# Patient Record
Sex: Female | Born: 1980 | Race: Black or African American | Hispanic: No | Marital: Single | Smoking: Never smoker
Health system: Southern US, Community
[De-identification: ages and names within clinical notes are randomized; demographics above are authoritative.]

## PROBLEM LIST (undated history)

## (undated) DIAGNOSIS — A6000 Herpesviral infection of urogenital system, unspecified: Secondary | ICD-10-CM

## (undated) DIAGNOSIS — E559 Vitamin D deficiency, unspecified: Secondary | ICD-10-CM

## (undated) DIAGNOSIS — E049 Nontoxic goiter, unspecified: Secondary | ICD-10-CM

## (undated) DIAGNOSIS — E282 Polycystic ovarian syndrome: Secondary | ICD-10-CM

## (undated) DIAGNOSIS — R87629 Unspecified abnormal cytological findings in specimens from vagina: Secondary | ICD-10-CM

## (undated) DIAGNOSIS — R310 Gross hematuria: Secondary | ICD-10-CM

## (undated) DIAGNOSIS — D649 Anemia, unspecified: Secondary | ICD-10-CM

## (undated) DIAGNOSIS — D219 Benign neoplasm of connective and other soft tissue, unspecified: Secondary | ICD-10-CM

## (undated) DIAGNOSIS — B353 Tinea pedis: Secondary | ICD-10-CM

## (undated) DIAGNOSIS — I10 Essential (primary) hypertension: Secondary | ICD-10-CM

## (undated) DIAGNOSIS — O09521 Supervision of elderly multigravida, first trimester: Secondary | ICD-10-CM

## (undated) DIAGNOSIS — IMO0002 Reserved for concepts with insufficient information to code with codable children: Secondary | ICD-10-CM

## (undated) DIAGNOSIS — Z6841 Body Mass Index (BMI) 40.0 and over, adult: Secondary | ICD-10-CM

## (undated) HISTORY — DX: Herpesviral infection of urogenital system, unspecified: A60.00

## (undated) HISTORY — DX: Vitamin D deficiency, unspecified: E55.9

## (undated) HISTORY — DX: Essential (primary) hypertension: I10

## (undated) HISTORY — DX: Nontoxic goiter, unspecified: E04.9

## (undated) HISTORY — DX: Tinea pedis: B35.3

## (undated) HISTORY — DX: Polycystic ovarian syndrome: E28.2

## (undated) HISTORY — DX: Reserved for concepts with insufficient information to code with codable children: IMO0002

## (undated) HISTORY — DX: Body Mass Index (BMI) 40.0 and over, adult: Z684

## (undated) HISTORY — DX: Gross hematuria: R31.0

## (undated) HISTORY — DX: Morbid (severe) obesity due to excess calories: E66.01

## (undated) HISTORY — DX: Supervision of elderly multigravida, first trimester: O09.521

---
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
## 2006-06-02 ENCOUNTER — Encounter: Payer: Self-pay | Admitting: Obstetrics & Gynecology

## 2006-06-02 ENCOUNTER — Ambulatory Visit: Payer: Self-pay | Admitting: Obstetrics & Gynecology

## 2006-12-08 ENCOUNTER — Ambulatory Visit: Payer: Self-pay | Admitting: Obstetrics & Gynecology

## 2007-08-23 ENCOUNTER — Ambulatory Visit: Payer: Self-pay | Admitting: Obstetrics and Gynecology

## 2007-08-25 ENCOUNTER — Encounter: Admission: RE | Admit: 2007-08-25 | Discharge: 2007-08-25 | Payer: Self-pay | Admitting: Obstetrics and Gynecology

## 2007-10-05 ENCOUNTER — Ambulatory Visit (HOSPITAL_COMMUNITY): Admission: RE | Admit: 2007-10-05 | Discharge: 2007-10-05 | Payer: Self-pay | Admitting: Gynecology

## 2007-10-19 ENCOUNTER — Ambulatory Visit: Payer: Self-pay | Admitting: Obstetrics and Gynecology

## 2007-12-01 ENCOUNTER — Emergency Department: Payer: Self-pay | Admitting: Emergency Medicine

## 2010-06-03 NOTE — Assessment & Plan Note (Signed)
NAMESHERON, TALLMAN                ACCOUNT NO.:  [id number]   MEDICAL RECORD NO.:  [id number]          PATIENT TYPE:  POB   LOCATION:  [facility] at [facility]         FACILITY:  [facility]   PHYSICIAN:  Johnella Moloney, MD        DATE OF BIRTH:  November 28, 1980   DATE OF SERVICE:  12/08/2006                                  CLINIC NOTE   CHIEF COMPLAINT:  1. Breast tenderness.  2. Vaginal discharge with odor.  3. Needs followup from previous visit.   HISTORY OF PRESENT ILLNESS:  Patient is a 30 year old gravida 0, who was  last seen on Jun 02, 2006, for her annual exam.  Patient, at that visit,  was evaluated regarding her obesity and hirsutism and was prescribed  Yasmin and Vaniqa.  Patient also had a thyroid stimulating hormone  check, which was normal and a calculated blood glucose that was normal.  At this visit, patient does report that she did not start using the  Yasmin or Vaniqa and is interested in obtaining another set of  prescriptions for both medications for treatment of her hirsutism.  Patient also reports having breast tenderness a few days.  She does not  feel any lumps or any other lesions or abnormal drainage.  She just  wanted to make sure everything was okay.  She does have a history of a  paternal grandmother, who developed early breast cancer in her eighties.  Patient also reports having vaginal discharge, which started three  months ago.  She describes the discharge as copious, white, with a fishy  odor that is associated with pruritus.  She was evaluated at another  medical facility and treated with Flagyl.  The discharge did go away,  but came back two weeks ago.  She denies any intermenstrual bleeding or  any other symptoms.   PHYSICAL EXAMINATION:  VITAL SIGNS:  Pulse 78, weight 281, blood  pressure 141/84.  IN GENERAL:  No apparent distress.  BREAST EXAM:  Bilateral symmetric breasts, no abnormal skin changes.  No  abnormal lumps palpated.  Patient reported some pain  on 10 o'clock  position of her left breast.  No abnormal changes or lesions palpated  there.  Overall normal breast exam.  ABDOMINAL EXAM:  Obese, nontender.  PELVIC:  Normal external female genitalia, no lesions.  On speculum  examination, small amount of thick, white discharge on the vaginal  vault.  Sample taken for wet prep.  No lesions observed.  Cervix normal.  Bimanual exam normal.   ASSESSMENT AND PLAN:  Patient is a 30 year old gravida 0, who comes in  for multiple issues:   1. Breast tenderness:  Likely cyclical breast tenderness.  Patient was      reassured that no lesions were palpated and there was no reason for      concern at this point.  She was told to continue to monitor her      breast tenderness and to return to clinic for any additional      symptoms.  She was also told that, once she starts the Yasmin, this      will be a common side effects from hormonal  treatment.  2. Abnormal vaginal discharge:  Small amount of discharge noted on      examination.  Wet prep obtained.  We will follow up results and      treat accordingly.  3. Hirsutism:  Patient will start her Yasmin and Vaniqa with her next      menstrual period and will follow up response of patient.   Patient was told to return to clinic for any further concerns or to  return next year for her annual exam.           ______________________________  Johnella Moloney, MD     UD/MEDQ  D:  12/08/2006  T:  12/08/2006  Job:  [id number]

## 2010-06-03 NOTE — Assessment & Plan Note (Signed)
[name], Ware                ACCOUNT NO.:  [id number]   MEDICAL RECORD NO.:  [id number]          PATIENT TYPE:  POB   LOCATION:  [facility]         FACILITY:  [facility]   PHYSICIAN:  Allie Bossier, MD        DATE OF BIRTH:  October 20, 1980   DATE OF SERVICE:                                  CLINIC NOTE   Michelle Ware is a 30 year old single black gravida 0 who comes in today for  her annual exam.  She has no particular complaints, although her  hirsutism has been unchanged since her laser treatments last year.  She  reports that with regards to her hirsutism she has had an endocrinology  exam in 2004 where blood work was done and she was told this was  normal.  She has also had a 30-pound weight gain in the year 2007.  Her  thyroid has not been evaluated any time recently.   PAST MEDICAL HISTORY:  Morbid obesity and hirsutism.   PAST SURGICAL HISTORY:  Oral surgery.  At that time she had no problems  with the anesthetic.  She has also had laser treatment for facial hair.   FAMILY HISTORY:  Positive for breast cancer in paternal grandmother, but  no GYN or colon malignancies.  Diabetes is also present in her family.   SOCIAL HISTORY:  She denies alcohol, tobacco or drug use.   ALLERGIES:  No allergies to medicines or Latex.   REVIEW OF SYSTEMS:  She has been abstinent for the last year.  She works  at [employer] in __________.  Her family doctor is Francia Greaves of  [place], and she reports monthly periods.   PHYSICAL EXAMINATION:  VITAL SIGNS:  Weight 276 pounds, height 5 feet 9-  1/2 inches, pulse 75.  HEENT:  Significant for advanced hirsutism involving facial hair, entire  chin, lower face and upper lip.  BREAST EXAM:  Normal.  ABDOMINAL EXAM:  Obese, but otherwise benign.  External genitalia  normal.  Cervix normal.  Bimanual exam is all normal.   ASSESSMENT/PLAN:  Annual exam.  I have checked a Pap smear and sent  gonococci and chlamydia cultures along with it.  With  regard to her  obesity, we discussed weight loss.  I will be checking a thyroid-  stimulating hormone today.  I am also checking a capillary blood glucose  to rule out diabetes.  With regard to her hirsutism, I  have given her a prescription for Yasmin as well as a prescription for  Vaniqa twice a day.  We will see her back in four weeks for a blood  pressure check since she started her Yasmin.      Allie Bossier, MD     MCD/MEDQ  D:  06/02/2006  T:  06/02/2006  Job:  [id number]

## 2010-06-03 NOTE — Assessment & Plan Note (Signed)
NAMEAMEYA, Michelle Ware                ACCOUNT NO.:  [id number]   MEDICAL RECORD NO.:  [id number]          PATIENT TYPE:  POB   LOCATION:  [facility]         FACILITY:  [facility]   PHYSICIAN:  Argentina Donovan, MD        DATE OF BIRTH:  [birth year]   DATE OF SERVICE:  08/23/2007                                  CLINIC NOTE   The patient is a 30 year old African American nulligravida female who  comes in today with a chief complaint of left breast tenderness.  She  points to a portion of the breast that is tender and says she feels  something there.  The patient weighs 264 and is 5 feet 9 inches tall.  In addition to the breast, she has been bothered by severe hirsutism  over the years.  When she puts on more weight, her periods become  irregular; however, they are regular now and she takes no medication.  She was given Vaniqa and Yaz in the past, but she never took the  medication.  She does shave the facial hair, which is significant.  She  has no family members who have a similar problem.   On examination, there is a marked hair growth on the sides of her face  and underneath her chin almost to the neck that have been recently  shaved, but are easily seen as hair growth.  The breast examination, the  breasts are large, pendulous, and symmetrical with the left breast  having no dominant masses and no nipple discharge bilaterally; however,  the right breast, there is a thickening noted in the far upper outer  quadrant of the breast at the very top, almost elongated thickness that  could be a thickening of the fascia.  She is right handed, so that is  her dominant side and muscle thickness could make this more prominent;  however, it tends to be tender, and especially when she bangs it with  her right arm and when she lays on that breast.  I feel the change and I  feel that it is probably benign, but this is her second visit here for  the same complaint.  I am going to order a mammogram to  check on that  area, and hopefully, they will follow with a followup sonogram if they  have to.  I am going to get a pelvic sonogram.  I have talked to her  about treatment of the hirsutism, but it is so marked and her periods  are completely regular that I want to see if she does have polycystic  ovaries.  If not, could she possibly have a female hormone-producing  ovarian tumor or some adrenal etiology.  I think if she does not have  any signs of polycystic ovaries because of her regular periods, she does  not fit the criteria for polycystic ovarian syndrome, and if the ovaries  are correct, I think we __________ a little further and make sure that  the adrenal glands have no problem in their function.  Apparently, she  had a hormone screen many years ago here in the office for the same  thing, although I do  not see her results here and was told everything  was alright, but she has not had up until this point, an ultrasound.  I  am going to have her come back in 3 weeks, so we can go over mammogram  with her and the ultrasound and decide if there is anything else further  that should be done.  I have told her that possibly the use of  spironolactone might stop the hair growth, but she would still have to  get rid of it.  She tried laser at one time, but it did not work, but  whether she uses any type of treatment for that I think that we have to  see if there is an etiology that we can find.   IMPRESSION:  Hirsutism, unknown etiology, with regular periods and right  breast tenderness with a thickening at the apex.           ______________________________  Argentina Donovan, MD    PR/MEDQ  D:  08/23/2007  T:  08/24/2007  Job:  [id number]

## 2010-06-03 NOTE — Assessment & Plan Note (Signed)
NAMERYIN, SCHILLO                ACCOUNT NO.:  [id number]   MEDICAL RECORD NO.:  [id number]          PATIENT TYPE:  POB   LOCATION:  [facility]         FACILITY:  [facility]   PHYSICIAN:  Argentina Donovan, MD        DATE OF BIRTH:  26-Oct-1980   DATE OF SERVICE:  10/19/2007                                  CLINIC NOTE   Please read the previous office note.   The patient is a 30 year old African American nulligravida who came with  chief complaint of hirsutism as well as breast pain.  The patient has  large pendulous breasts.  We felt a thickening in the upper outer  quadrant of the right breast where she was having the pain.  The  mammogram and ultrasound were completely normal and it was suggested  that she does not get a followup until she is 38.  May there will be  neuromuscular from the weight of the breasts of that area.  Her periods  have been regular since she started losing weight and the hirsutism has  not gotten any better.  We talked to her about Glucophage,  spironolactone, birth control pills, etc, and she is agreed to go on  Glucophage to start with.  The patient has a 2/3 criteria that make the  diagnosis one being the polycystic ovaries, the second being hirsutism  even now with the weight loss that she has already attained.  She has  had a marked improvement in the regularity of her periods.  We will go  and see her back in 6 months.  She is on 500 t.i.d. of Glucophage with  meals.   DISCHARGE DIAGNOSES:  1. Right breast tenderness, probably neuromuscular.  2. Polycystic ovarian syndrome.           ______________________________  Argentina Donovan, MD     PR/MEDQ  D:  10/19/2007  T:  10/19/2007  Job:  [id number]

## 2011-05-30 ENCOUNTER — Emergency Department: Payer: Self-pay | Admitting: Emergency Medicine

## 2011-11-24 DIAGNOSIS — Z8742 Personal history of other diseases of the female genital tract: Secondary | ICD-10-CM | POA: Insufficient documentation

## 2012-11-22 LAB — LIPID PANEL
Cholesterol: 154 mg/dL (ref 0–200)
HDL: 62 mg/dL (ref 35–70)
LDL Cholesterol: 83 mg/dL
Triglycerides: 44 mg/dL (ref 40–160)

## 2012-11-22 LAB — HEMOGLOBIN A1C: Hgb A1c MFr Bld: 5.9 % (ref 4.0–6.0)

## 2013-07-20 LAB — HM PAP SMEAR: HM Pap smear: NORMAL

## 2013-08-16 ENCOUNTER — Ambulatory Visit: Payer: Self-pay | Admitting: General Surgery

## 2013-08-31 ENCOUNTER — Ambulatory Visit (INDEPENDENT_AMBULATORY_CARE_PROVIDER_SITE_OTHER): Payer: Managed Care, Other (non HMO) | Admitting: General Surgery

## 2013-08-31 ENCOUNTER — Encounter: Payer: Self-pay | Admitting: General Surgery

## 2013-08-31 VITALS — BP 130/88 | HR 76 | Resp 14 | Ht 70.0 in | Wt 275.0 lb

## 2013-08-31 DIAGNOSIS — L729 Follicular cyst of the skin and subcutaneous tissue, unspecified: Secondary | ICD-10-CM

## 2013-08-31 DIAGNOSIS — L723 Sebaceous cyst: Secondary | ICD-10-CM

## 2013-08-31 MED ORDER — DOXYCYCLINE HYCLATE 100 MG PO CAPS: 100.0000 mg | ORAL_CAPSULE | Freq: Every day | ORAL | Status: DC

## 2013-08-31 NOTE — Progress Notes (Signed)
Patient ID: Michelle Ware, female   DOB: [birth year], 33 y.o.   MRN: [id number]  Chief Complaint  Patient presents with  . Cyst    HPI Michelle Ware is a 33 y.o. female.  Here for evaluation of a possible back cyst. She noticed the area approximately 3 years ago. She states the area will swell and get inflamed some times and then it will resolve. She states it is enlarged right now. She had one episode approximately 2 years ago when the area drained but has not drained since that time. She denies any fever or chills.    HPI  Past Medical History  Diagnosis Date  . Hypertension   . Polycystic ovarian syndrome     Past Surgical History  Procedure Laterality Date  . Mouth surgery  1996    Family History  Problem Relation Age of Onset  . Thyroid disease Mother     Social History History  Substance Use Topics  . Smoking status: Never Smoker   . Smokeless tobacco: Not on file  . Alcohol Use: Yes     Comment: 2/week    No Known Allergies  Current Outpatient Prescriptions  Medication Sig Dispense Refill  . cholecalciferol (VITAMIN D) 1000 UNITS tablet Take 1,000 Units by mouth daily.      . hydrochlorothiazide (MICROZIDE) 12.5 MG capsule Take 12.5 mg by mouth daily.      Marland Kitchen ibuprofen (ADVIL,MOTRIN) 200 MG tablet Take 200 mg by mouth every 6 (six) hours as needed.      . doxycycline (VIBRAMYCIN) 100 MG capsule Take 1 capsule (100 mg total) by mouth daily.  7 capsule  0   No current facility-administered medications for this visit.    Review of Systems Review of Systems  Constitutional: Negative.   Respiratory: Negative.   Cardiovascular: Negative.     Blood pressure 130/88, pulse 76, resp. rate 14, height 5\' 10"  (1.778 m), weight 275 lb (124.739 kg), last menstrual period 08/31/2013.  Physical Exam Physical Exam  Constitutional: She is oriented to person, place, and time. She appears well-developed and well-nourished.  Neurological: She is alert and oriented to person,  place, and time.  Skin: Skin is warm and dry.  2.5 cm skin cyst tense and fluctuance but no redness. Located just to the right of the spine below the base of neck.     Data Reviewed    Assessment    Mildly inflamed skin cyst     Plan    Recommended excision. 1 week of antibiotic peri procedure.  Discussed fully with pt and she is agreeable.       SANKAR,SEEPLAPUTHUR G 09/04/2013, 8:23 AM

## 2013-08-31 NOTE — Patient Instructions (Addendum)
Patient to return for excision of skin cyst. Patient to start antibiotic-Doxycycline 100mg  daily. The patient is aware to call back for any questions or concerns.

## 2013-09-01 DIAGNOSIS — L729 Follicular cyst of the skin and subcutaneous tissue, unspecified: Secondary | ICD-10-CM | POA: Insufficient documentation

## 2013-09-04 ENCOUNTER — Ambulatory Visit (INDEPENDENT_AMBULATORY_CARE_PROVIDER_SITE_OTHER): Payer: Managed Care, Other (non HMO) | Admitting: General Surgery

## 2013-09-04 ENCOUNTER — Encounter: Payer: Self-pay | Admitting: General Surgery

## 2013-09-04 ENCOUNTER — Other Ambulatory Visit: Payer: Self-pay | Admitting: General Surgery

## 2013-09-04 VITALS — BP 124/76 | HR 82 | Resp 14 | Ht 70.0 in | Wt 270.0 lb

## 2013-09-04 DIAGNOSIS — L723 Sebaceous cyst: Secondary | ICD-10-CM

## 2013-09-04 NOTE — Progress Notes (Signed)
Patient ID: Michelle Ware, female   DOB: March 20, 1980, 33 y.o.   MRN: [id number]  Chief Complaint  Patient presents with  . Procedure    excision skin cyst n neck     HPI Michelle Ware is a 33 y.o. female here today for a excision skin cyst of neck.  HPI  Past Medical History  Diagnosis Date  . Hypertension   . Polycystic ovarian syndrome     Past Surgical History  Procedure Laterality Date  . Mouth surgery  1996    Family History  Problem Relation Age of Onset  . Thyroid disease Mother     Social History History  Substance Use Topics  . Smoking status: Never Smoker   . Smokeless tobacco: Not on file  . Alcohol Use: Yes     Comment: 2/week    No Known Allergies  Current Outpatient Prescriptions  Medication Sig Dispense Refill  . cholecalciferol (VITAMIN D) 1000 UNITS tablet Take 1,000 Units by mouth daily.      Marland Kitchen doxycycline (VIBRAMYCIN) 100 MG capsule Take 1 capsule (100 mg total) by mouth daily.  7 capsule  0  . hydrochlorothiazide (MICROZIDE) 12.5 MG capsule Take 12.5 mg by mouth daily.      Marland Kitchen ibuprofen (ADVIL,MOTRIN) 200 MG tablet Take 200 mg by mouth every 6 (six) hours as needed.       No current facility-administered medications for this visit.    Review of Systems Review of Systems  Blood pressure 124/76, pulse 82, resp. rate 14, height 5\' 10"  (1.778 m), weight 270 lb (122.471 kg), last menstrual period 08/31/2013.  Physical Exam Physical Exam 2.5cm cyst upper back base of neck Dat    Assessment    Sebaceous cyst     Plan     Procedure ; Excision of skin cyst base of neck.  Anesthetic: 31ml of 1% xylocaine mixd with 0.5% marcaine.  After local anesthetic instilled, area prepped with Chloro Prep and draped.  Elliptical skin incision was made and a mildly inflamed cyst was completely excised out. Bleeding controlled with cautery and 3-0 vicryl suture ligatures. Layered closure done. 3-0 vicryl in deeper planes and skin closed with 4-0 nylon.   Neosporin oint, telfa gauze and tegaderm placed. No immediate problems from procedure.       Michelle Ware 09/06/2013, 6:27 AM

## 2013-09-04 NOTE — Patient Instructions (Signed)
Follow up in two weeks with the nurse for suture removal.

## 2013-09-06 LAB — PATHOLOGY

## 2013-09-18 ENCOUNTER — Ambulatory Visit (INDEPENDENT_AMBULATORY_CARE_PROVIDER_SITE_OTHER): Payer: Self-pay | Admitting: *Deleted

## 2013-09-18 DIAGNOSIS — L723 Sebaceous cyst: Secondary | ICD-10-CM

## 2013-09-18 NOTE — Patient Instructions (Signed)
Patient to return as needed. The patient is aware to call back for any questions or concerns. 

## 2013-09-18 NOTE — Progress Notes (Signed)
Patient came in today for a wound check.  The wound is clean, with no signs of infection noted. Sutures removed and steri strips applied. Follow up as needed. Patient aware of pathology results.

## 2013-11-20 ENCOUNTER — Encounter: Payer: Self-pay | Admitting: General Surgery

## 2014-07-06 ENCOUNTER — Telehealth: Payer: Self-pay | Admitting: Family Medicine

## 2014-07-06 MED ORDER — FLUCONAZOLE 150 MG PO TABS
150.0000 mg | ORAL_TABLET | ORAL | Status: DC
Start: 2014-07-06 — End: 2014-07-24

## 2014-07-06 NOTE — Telephone Encounter (Signed)
done

## 2014-07-06 NOTE — Telephone Encounter (Signed)
Pt said that she is needing the pill for yeast since she has been on the antibotic. Could you can maybe get Nadine Counts to send this please.Pharm is [facility] IN [place].

## 2014-07-24 ENCOUNTER — Encounter: Payer: Self-pay | Admitting: Family Medicine

## 2014-07-24 ENCOUNTER — Ambulatory Visit (INDEPENDENT_AMBULATORY_CARE_PROVIDER_SITE_OTHER): Payer: Managed Care, Other (non HMO) | Admitting: Family Medicine

## 2014-07-24 ENCOUNTER — Other Ambulatory Visit: Payer: Self-pay | Admitting: Family Medicine

## 2014-07-24 VITALS — BP 144/78 | HR 84 | Temp 97.9°F | Resp 18 | Ht 68.0 in | Wt 275.4 lb

## 2014-07-24 DIAGNOSIS — E049 Nontoxic goiter, unspecified: Secondary | ICD-10-CM

## 2014-07-24 DIAGNOSIS — Z8742 Personal history of other diseases of the female genital tract: Secondary | ICD-10-CM

## 2014-07-24 DIAGNOSIS — E010 Iodine-deficiency related diffuse (endemic) goiter: Secondary | ICD-10-CM | POA: Insufficient documentation

## 2014-07-24 DIAGNOSIS — L689 Hypertrichosis, unspecified: Secondary | ICD-10-CM | POA: Insufficient documentation

## 2014-07-24 DIAGNOSIS — R7989 Other specified abnormal findings of blood chemistry: Secondary | ICD-10-CM | POA: Insufficient documentation

## 2014-07-24 DIAGNOSIS — E559 Vitamin D deficiency, unspecified: Secondary | ICD-10-CM | POA: Insufficient documentation

## 2014-07-24 DIAGNOSIS — Z114 Encounter for screening for human immunodeficiency virus [HIV]: Secondary | ICD-10-CM

## 2014-07-24 DIAGNOSIS — I10 Essential (primary) hypertension: Secondary | ICD-10-CM

## 2014-07-24 DIAGNOSIS — Z124 Encounter for screening for malignant neoplasm of cervix: Secondary | ICD-10-CM | POA: Diagnosis not present

## 2014-07-24 DIAGNOSIS — A630 Anogenital (venereal) warts: Secondary | ICD-10-CM

## 2014-07-24 DIAGNOSIS — N898 Other specified noninflammatory disorders of vagina: Secondary | ICD-10-CM | POA: Diagnosis not present

## 2014-07-24 DIAGNOSIS — R946 Abnormal results of thyroid function studies: Secondary | ICD-10-CM

## 2014-07-24 DIAGNOSIS — Z13220 Encounter for screening for lipoid disorders: Secondary | ICD-10-CM | POA: Diagnosis not present

## 2014-07-24 DIAGNOSIS — E282 Polycystic ovarian syndrome: Secondary | ICD-10-CM | POA: Insufficient documentation

## 2014-07-24 MED ORDER — IMIQUIMOD 5 % EX CREA: TOPICAL_CREAM | CUTANEOUS | Status: DC

## 2014-07-24 NOTE — Progress Notes (Signed)
Name: Michelle Ware   MRN: [id number]    DOB: 07-May-1980   Date:07/24/2014       Progress Note  Subjective  Chief Complaint  Chief Complaint  Patient presents with  . Vaginal Discharge    Onset 2-3 days, pressure, crampy, blood, yellowish discharge    HPI  HTN: she has not been taking bp medication daily, she skipped today's dose, and bp is elevated today.  Denies chest pain or SOB  Thyromegaly and low TSH: she did not go get her thyroid US, not sure if it was scheduled, we will re-schedule it for her.  PCOS: she states her cycles have been regular, she has hypertrichosis and elevated blood pressure, obesity. She takes Metformin occasionally  Vaginal discharge and bleeding: she noticed some vaginal discharge, yellow in color yesterday and some blood on her panty liner yesterday, no symptoms today. She is worried because she has a history of LGISL and would like to get it checked. Same sexual partner for over one year. She noticed some discomfort with intercourse last week   Patient Active Problem List   Diagnosis Date Noted  . Hypertension, benign 07/24/2014  . Essential (primary) hypertension 07/24/2014  . Decreased thyroid stimulating hormone (TSH) level 07/24/2014  . Extreme obesity 07/24/2014  . 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 Vitamin D deficiency 07/24/2014  . Thyromegaly 07/24/2014  . PCOS (polycystic ovarian syndrome) 07/24/2014  . History of abnormal cervical Pap smear 11/24/2011  . Genital herpes 11/29/2008    Past Surgical History  Procedure Laterality Date  . Mouth surgery  1996    Family History  Problem Relation Age of Onset  . Thyroid disease Mother   . Hypertension Mother     History   Social History  . Marital Status: Single    Spouse Name: N/A  . Number of Children: N/A  . Years of Education: N/A   Occupational History  . Not on file.   Social History Main Topics  . Smoking status: Never Smoker   . Smokeless tobacco: Never Used  . Alcohol Use:  0.0 oz/week    0 Standard drinks or equivalent per week     Comment: 2/week  . Drug Use: No  . Sexual Activity:    Partners: Male   Other Topics Concern  . Not on file   Social History Narrative     Current outpatient prescriptions:  .  cholecalciferol (VITAMIN D) 1000 UNITS tablet, Take 1,000 Units by mouth daily., Disp: , Rfl:  .  hydrochlorothiazide (HYDRODIURIL) 25 MG tablet, Take 25 mg by mouth daily., Disp: , Rfl: 2 .  ibuprofen (ADVIL,MOTRIN) 200 MG tablet, Take 200 mg by mouth every 6 (six) hours as needed., Disp: , Rfl:  .  metFORMIN (GLUCOPHAGE) 500 MG tablet, Take 1 tablet by mouth daily., Disp: , Rfl:  .  valACYclovir (VALTREX) 500 MG tablet, TAKE 1 TABLET BY MOUTH DAILY FOR PREVENTION AND THREE TIMES DAILY FOR OUTBREAKS, Disp: , Rfl: 12  No Known Allergies   ROS  Constitutional: Negative for fever or weight change.  Respiratory: Negative for cough and shortness of breath.   Cardiovascular: Negative for chest pain or palpitations.  Gastrointestinal: Negative for abdominal pain, no bowel changes.  Musculoskeletal: Negative for gait problem or joint swelling.  Skin: Negative for rash.  Neurological: Negative for dizziness or headache.  No other specific complaints in a complete review of systems (except as listed in HPI above).  Objective  Filed Vitals:   07/24/14 1511  BP:  144/78  Pulse: 84  Temp: 97.9 F (36.6 C)  TempSrc: Oral  Resp: 18  Height: 5\' 8"  (1.727 m)  Weight: 275 lb 6.4 oz (124.921 kg)  SpO2: 98%    Body mass index is 41.88 kg/(m^2).  Physical Exam Constitutional: Patient appears well-developed and well-nourished. No distress. Obese, hypertrichosis on face Eyes:  No scleral icterus. PERL Neck: Normal range of motion. Neck supple. Thyromegaly Cardiovascular: Normal rate, regular rhythm and normal heart sounds.  No murmur heard. No BLE edema. Pulmonary/Chest: Effort normal and breath sounds normal. No respiratory distress. Abdominal:  Soft.  There is no tenderness. GYN: pelvic exam showed normal vaginal walls, no discharge, cervix is friable, she has genital warts on perineal area Psychiatric: Patient has a normal mood and affect. behavior is normal. Judgment and thought content normal.  PHQ2/9: Depression screen PHQ 2/9 07/24/2014  Decreased Interest 0  Down, Depressed, Hopeless 0  PHQ - 2 Score 0     Fall Risk: Fall Risk  07/24/2014  Falls in the past year? No   Assessment & Plan   1. History of abnormal cervical Pap smear  - Cytology - PAP  2.Vaginal discharge Check on pap   3. Decreased thyroid stimulating hormone (TSH) level Recheck level  4. Hypertension, benign Needs to take bp medication daily, bp is not at goal today - Comprehensive Metabolic Panel (CMET)  5. Thyromegaly  - TSH - US Soft Tissue Head/Neck; Future  6. PCOS (polycystic ovarian syndrome)  - HgB A1c  7. Encounter for screening for HIV  - HIV antibody (with reflex)  8. Lipid screening  - Lipid Profile  9. Cervical cancer screening Since she is due for screening test and is having symptoms  10. Genital warts  - imiquimod (ALDARA) 5 % cream; Apply topically 3 (three) times a week.  Dispense: 12 each; Refill: 0

## 2014-07-24 NOTE — Patient Instructions (Signed)
Genital Warts Genital warts are a sexually transmitted infection. They may appear as small bumps on the tissues of the genital area. CAUSES  Genital warts are caused by a virus called human papillomavirus (HPV). HPV is the most common sexually transmitted disease (STD) and infection of the sex organs. This infection is spread by having unprotected sex with an infected person. It can be spread by vaginal, anal, and oral sex. Many people do not know they are infected. They may be infected for years without problems. However, even if they do not have problems, they can unknowingly pass the infection to their sexual partners. SYMPTOMS   Itching and irritation in the genital area.  Warts that bleed.  Painful sexual intercourse. DIAGNOSIS  Warts are usually recognized with the naked eye on the vagina, vulva, perineum, anus, and rectum. Certain tests can also diagnose genital warts, such as:  A Pap test.  A tissue sample (biopsy) exam.  Colposcopy. A magnifying tool is used to examine the vagina and cervix. The HPV cells will change color when certain solutions are used. TREATMENT  Warts can be removed by:  Applying certain chemicals, such as cantharidin or podophyllin.  Liquid nitrogen freezing (cryotherapy).  Immunotherapy with Candida or Trichophyton injections.  Laser treatment.  Burning with an electrified probe (electrocautery).  Interferon injections.  Surgery. PREVENTION  HPV vaccination can help prevent HPV infections that cause genital warts and that cause cancer of the cervix. It is recommended that the vaccination be given to people between the ages 9 to 26 years old. The vaccine might not work as well or might not work at all if you already have HPV. It should not be given to pregnant women. HOME CARE INSTRUCTIONS   It is important to follow your caregiver's instructions. The warts will not go away without treatment. Repeat treatments are often needed to get rid of warts.  Even after it appears that the warts are gone, the normal tissue underneath often remains infected.  Do not try to treat genital warts with medicine used to treat hand warts. This type of medicine is strong and can burn the skin in the genital area, causing more damage.  Tell your past and current sexual partner(s) that you have genital warts. They may be infected also and need treatment.  Avoid sexual contact while being treated.  Do not touch or scratch the warts. The infection may spread to other parts of your body.  Women with genital warts should have a cervical cancer check (Pap test) at least once a year. This type of cancer is slow-growing and can be cured if found early. Chances of developing cervical cancer are increased with HPV.  Inform your obstetrician about your warts in the event of pregnancy. This virus can be passed to the baby's respiratory tract. Discuss this with your caregiver.  Use a condom during sexual intercourse. Following treatment, the use of condoms will help prevent reinfection.  Ask your caregiver about using over-the-counter anti-itch creams. SEEK MEDICAL CARE IF:   Your treated skin becomes red, swollen, or painful.  You have a fever.  You feel generally ill.  You feel little lumps in and around your genital area.  You are bleeding or have painful sexual intercourse. MAKE SURE YOU:   Understand these instructions.  Will watch your condition.  Will get help right away if you are not doing well or get worse. Document Released: 01/03/2000 Document Revised: 05/22/2013 Document Reviewed: 07/14/2010 Exit[employer] Patient Information 2015 [employer]. This   information is not intended to replace advice given to you by your health care provider. Make sure you discuss any questions you have with your health care provider.  

## 2014-07-27 ENCOUNTER — Ambulatory Visit: Payer: Managed Care, Other (non HMO)

## 2014-07-30 NOTE — Progress Notes (Signed)
Patient notified of GC/Chl results.

## 2014-07-31 LAB — PAP LB, CT-NG NAA, HPV HIGH-RISK
HPV, HIGH-RISK: NEGATIVE
PAP SMEAR COMMENT: 0

## 2014-08-01 ENCOUNTER — Ambulatory Visit: Payer: Managed Care, Other (non HMO)

## 2014-08-01 NOTE — Progress Notes (Signed)
Patient notified by phone.

## 2014-08-28 ENCOUNTER — Ambulatory Visit: Payer: Managed Care, Other (non HMO) | Admitting: Family Medicine

## 2014-09-17 ENCOUNTER — Encounter: Payer: Self-pay | Admitting: Family Medicine

## 2014-10-17 ENCOUNTER — Encounter (INDEPENDENT_AMBULATORY_CARE_PROVIDER_SITE_OTHER): Payer: Self-pay

## 2014-10-17 ENCOUNTER — Ambulatory Visit (INDEPENDENT_AMBULATORY_CARE_PROVIDER_SITE_OTHER): Payer: Managed Care, Other (non HMO) | Admitting: Family Medicine

## 2014-10-17 ENCOUNTER — Encounter: Payer: Self-pay | Admitting: Family Medicine

## 2014-10-17 VITALS — BP 140/78 | HR 83 | Temp 99.2°F | Resp 18 | Ht 70.0 in | Wt 284.0 lb

## 2014-10-17 DIAGNOSIS — I10 Essential (primary) hypertension: Secondary | ICD-10-CM

## 2014-10-17 DIAGNOSIS — Z3201 Encounter for pregnancy test, result positive: Secondary | ICD-10-CM

## 2014-10-17 DIAGNOSIS — E282 Polycystic ovarian syndrome: Secondary | ICD-10-CM

## 2014-10-17 DIAGNOSIS — Z23 Encounter for immunization: Secondary | ICD-10-CM | POA: Diagnosis not present

## 2014-10-17 LAB — POCT URINE PREGNANCY: PREG TEST UR: POSITIVE — AB

## 2014-10-17 MED ORDER — METHYLDOPA 250 MG PO TABS: 250.0000 mg | ORAL_TABLET | Freq: Three times a day (TID) | ORAL | Status: DC

## 2014-10-17 NOTE — Progress Notes (Signed)
Name: Michelle Ware   MRN: [id number]    DOB: 02/23/1980   Date:10/17/2014       Progress Note  Subjective  Chief Complaint  Chief Complaint  Patient presents with  . Amenorrhea    Patient is 15 days late and wants to check to see if she is pregnant. Patient has been feeling weird and having headaches, cramping, breast tenderness.    HPI  Pregnancy test positive: LMP: August 17th, 2016 , she has a history of PCOS and HTN. She has been taking Metformin and HCTZ. BP today is not at goal . She has noticed cramping at night. Breast tenderness , bloatness and headaches a few weeks ago. She denies nausea or vomiting.   HTN: she has been on HCTZ for elevated blood pressure for years, no side effects, but even though it is okay to continue during pregnancy since she has been stable on medication, patient prefers to take something that is safer during pregnancy. Explained bp has to be around 120/80 to decrease fetal complications.    Patient Active Problem List   Diagnosis Date Noted  . Hypertension, benign 07/24/2014  . Essential (primary) hypertension 07/24/2014  . Decreased thyroid stimulating hormone (TSH) level 07/24/2014  . Extreme obesity 07/24/2014  . 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 Vitamin D deficiency 07/24/2014  . Thyromegaly 07/24/2014  . PCOS (polycystic ovarian syndrome) 07/24/2014  . History of abnormal cervical Pap smear 11/24/2011  . Genital herpes 11/29/2008    Past Surgical History  Procedure Laterality Date  . Mouth surgery  1996    Family History  Problem Relation Age of Onset  . Thyroid disease Mother   . Hypertension Mother     Social History   Social History  . Marital Status: Single    Spouse Name: N/A  . Number of Children: N/A  . Years of Education: N/A   Occupational History  . Not on file.   Social History Main Topics  . Smoking status: Never Smoker   . Smokeless tobacco: Never Used  . Alcohol Use: 0.0 oz/week    0 Standard drinks or  equivalent per week     Comment: 2/week  . Drug Use: No  . Sexual Activity:    Partners: Male   Other Topics Concern  . Not on file   Social History Narrative     Current outpatient prescriptions:  .  Prenatal Vit-Fe Fumarate-FA (NAT-RUL PRENATAL VITAMINS PO), Take 1 tablet by mouth., Disp: , Rfl:  .  valACYclovir (VALTREX) 500 MG tablet, TAKE 1 TABLET BY MOUTH DAILY FOR PREVENTION AND THREE TIMES DAILY FOR OUTBREAKS, Disp: , Rfl: 12 .  cholecalciferol (VITAMIN D) 1000 UNITS tablet, Take 1,000 Units by mouth daily., Disp: , Rfl:  .  ibuprofen (ADVIL,MOTRIN) 200 MG tablet, Take 200 mg by mouth every 6 (six) hours as needed., Disp: , Rfl:  .  imiquimod (ALDARA) 5 % cream, Apply topically 3 (three) times a week. (Patient not taking: Reported on 10/17/2014), Disp: 12 each, Rfl: 0 .  methyldopa (ALDOMET) 250 MG tablet, Take 1 tablet (250 mg total) by mouth 3 (three) times daily., Disp: 90 tablet, Rfl: 0  No Known Allergies   ROS   Constitutional: Negative for fever or weight change.  Respiratory: Negative for cough and shortness of breath.   Cardiovascular: Negative for chest pain or palpitations.  Gastrointestinal: Some nocturnal cramping , no bowel changes.  Musculoskeletal: Negative for gait problem or joint swelling.  Skin: Negative for rash.  Neurological: Negative  for dizziness or headache.  No other specific complaints in a complete review of systems (except as listed in HPI above).  Objective  Filed Vitals:   10/17/14 0815  BP: 140/78  Pulse: 83  Temp: 99.2 F (37.3 C)  TempSrc: Oral  Resp: 18  Height: 5\' 10"  (1.778 m)  Weight: 284 lb (128.822 kg)  SpO2: 99%    Body mass index is 40.75 kg/(m^2).  Physical Exam   Constitutional: Patient appears well-developed and well-nourished. Obese  No distress.  HEENT: head atraumatic, normocephalic, pupils equal and reactive to light, neck supple, throat within normal limits Cardiovascular: Normal rate, regular rhythm and  normal heart sounds.  No murmur heard. No BLE edema. Pulmonary/Chest: Effort normal and breath sounds normal. No respiratory distress. Abdominal: Soft.  There is no tenderness. Psychiatric: Patient has a normal mood and affect. behavior is normal. Judgment and thought content normal. Skin: hypertrichosis   Recent Results (from the past 2160 hour(s))  Pap LB, CT/NG NAA, and HPV (high risk)     Status: None   Collection Time: 07/24/14 12:00 AM  Result Value Ref Range   DIAGNOSIS: Comment     Comment: NEGATIVE FOR INTRAEPITHELIAL LESION AND MALIGNANCY. PREDOMINANCE OF COCCOBACILLI CONSISTENT WITH SHIFT IN VAGINAL FLORA IS PRESENT.    Specimen adequacy: Comment     Comment: Satisfactory for evaluation. Endocervical and/or squamous metaplastic cells (endocervical component) are present.    Performed by: Comment     Comment: Alwyn Ren, Cytotechnologist (ASCP)   PAP SMEAR COMMENT .    PATHOLOGIST PROVIDED ICD10: Comment     Comment: R87.5   Note: Comment     Comment: The Pap smear is a screening test designed to aid in the detection of premalignant and malignant conditions of the uterine cervix.  It is not a diagnostic procedure and should not be used as the sole means of detecting cervical cancer.  Both false-positive and false-negative reports do occur.    HPV, high-risk Negative Negative    Comment: This high-risk HPV test detects thirteen high-risk types (16/18/31/33/35/39/45/51/52/56/58/59/68) without differentiation.   POCT urine pregnancy     Status: Abnormal   Collection Time: 10/17/14  8:15 AM  Result Value Ref Range   Preg Test, Ur Positive (A) Negative     PHQ2/9: Depression screen [facility] 2/9 10/17/2014 07/24/2014  Decreased Interest 0 0  Down, Depressed, Hopeless 0 0  PHQ - 2 Score 0 0     Fall Risk: Fall Risk  10/17/2014 07/24/2014  Falls in the past year? No No     Functional Status Survey: Is the patient deaf or have difficulty hearing?: No Does the patient  have difficulty seeing, even when wearing glasses/contacts?: No Does the patient have difficulty concentrating, remembering, or making decisions?: No Does the patient have difficulty walking or climbing stairs?: No Does the patient have difficulty dressing or bathing?: No Does the patient have difficulty doing errands alone such as visiting a doctor's office or shopping?: No    Assessment & Plan  1. Pregnancy confirmed by positive urine test  LMP: August 17th, 2016 EDD: May 24th, 2016  Discussed importance of prenatal vitamins, prenatal care, no alcohol, tobacco use or drug of abuse. Eat healthy, avoid otc medication or caffeine  - POCT urine pregnancy - Ambulatory referral to Obstetrics / Gynecology  2. PCOS (polycystic ovarian syndrome)  She may stop taking Metformin.  3. Hypertension, benign  Stop HCTZ and start Methyldopa for bp, discussed possible side effects. 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  Dispense: 90 tablet; Refill: 0  4. Needs flu shot   - Fluvarix Today

## 2014-10-26 ENCOUNTER — Ambulatory Visit (INDEPENDENT_AMBULATORY_CARE_PROVIDER_SITE_OTHER): Payer: Managed Care, Other (non HMO)

## 2014-10-26 VITALS — BP 135/84 | HR 76 | Wt 288.8 lb

## 2014-10-26 DIAGNOSIS — R638 Other symptoms and signs concerning food and fluid intake: Secondary | ICD-10-CM

## 2014-10-26 DIAGNOSIS — E282 Polycystic ovarian syndrome: Secondary | ICD-10-CM

## 2014-10-26 DIAGNOSIS — Z3491 Encounter for supervision of normal pregnancy, unspecified, first trimester: Secondary | ICD-10-CM

## 2014-10-26 DIAGNOSIS — Z113 Encounter for screening for infections with a predominantly sexual mode of transmission: Secondary | ICD-10-CM

## 2014-10-26 DIAGNOSIS — B009 Herpesviral infection, unspecified: Secondary | ICD-10-CM

## 2014-10-26 NOTE — Progress Notes (Signed)
Michelle Ware presents for NOB nurse interview visit. G1-.  P0-. Pregnancy education material explained and given. _NO cats in the home. NOB labs ordered. TSH and HbgA1c due to Increased BMI. HIV labs and Drug screen were ordered.  PNV encouraged. NT to discuss with provider. Pt. To follow up with provider in 3 weeks for NOB physical with [facility]. Last pap 07/24/2014 neg/neg. H/o pcos was taking metformin. Advised by pcp to d/c 10/17/2014. H/o hsv2. Takes valtrex for outbreaks only. HTN- pcp changed to methyldopa last week. Was on HCTZ. Flu vaccine given by pcp 10/17/14. Pt c/o of breast tenderness and cramp. NO vb. Advised tylenol es and stay hydrated.  All questions answered.

## 2014-10-27 LAB — MICROSCOPIC EXAMINATION: CASTS: NONE SEEN /LPF

## 2014-10-27 LAB — CBC WITH DIFFERENTIAL/PLATELET
BASOS ABS: 0 10*3/uL (ref 0.0–0.2)
Basos: 0 %
EOS (ABSOLUTE): 0.1 10*3/uL (ref 0.0–0.4)
Eos: 1 %
HEMOGLOBIN: 11 g/dL — AB (ref 11.1–15.9)
Hematocrit: 33.9 % — ABNORMAL LOW (ref 34.0–46.6)
Immature Grans (Abs): 0 10*3/uL (ref 0.0–0.1)
Immature Granulocytes: 0 %
LYMPHS ABS: 2.9 10*3/uL (ref 0.7–3.1)
Lymphs: 36 %
MCH: 25.6 pg — ABNORMAL LOW (ref 26.6–33.0)
MCHC: 32.4 g/dL (ref 31.5–35.7)
MCV: 79 fL (ref 79–97)
MONOCYTES: 9 %
MONOS ABS: 0.8 10*3/uL (ref 0.1–0.9)
NEUTROS ABS: 4.3 10*3/uL (ref 1.4–7.0)
Neutrophils: 54 %
Platelets: 267 10*3/uL (ref 150–379)
RBC: 4.3 x10E6/uL (ref 3.77–5.28)
RDW: 15.3 % (ref 12.3–15.4)
WBC: 8 10*3/uL (ref 3.4–10.8)

## 2014-10-27 LAB — HEPATITIS B SURFACE ANTIGEN: HEP B S AG: NEGATIVE

## 2014-10-27 LAB — TSH: TSH: 0.48 u[IU]/mL (ref 0.450–4.500)

## 2014-10-27 LAB — URINE CULTURE

## 2014-10-27 LAB — URINALYSIS, ROUTINE W REFLEX MICROSCOPIC
BILIRUBIN UA: NEGATIVE
GLUCOSE, UA: NEGATIVE
KETONES UA: NEGATIVE
NITRITE UA: NEGATIVE
Protein, UA: NEGATIVE
RBC, UA: NEGATIVE
SPEC GRAV UA: 1.023 (ref 1.005–1.030)
Urobilinogen, Ur: 0.2 mg/dL (ref 0.2–1.0)
pH, UA: 6.5 (ref 5.0–7.5)

## 2014-10-27 LAB — HIV ANTIBODY (ROUTINE TESTING W REFLEX): HIV Screen 4th Generation wRfx: NONREACTIVE

## 2014-10-27 LAB — RPR: RPR Ser Ql: NONREACTIVE

## 2014-10-27 LAB — ABO AND RH: RH TYPE: POSITIVE

## 2014-10-27 LAB — HEMOGLOBIN A1C
ESTIMATED AVERAGE GLUCOSE: 105 mg/dL
Hgb A1c MFr Bld: 5.3 % (ref 4.8–5.6)

## 2014-10-27 LAB — VARICELLA ZOSTER ANTIBODY, IGG: Varicella zoster IgG: 3075 index (ref >165)

## 2014-10-27 LAB — ANTIBODY SCREEN: ANTIBODY SCREEN: NEGATIVE

## 2014-10-27 LAB — RUBELLA SCREEN: RUBELLA: 1.33 {index} (ref >0.99)

## 2014-10-29 LAB — GC/CHLAMYDIA PROBE AMP
CHLAMYDIA, DNA PROBE: NEGATIVE
NEISSERIA GONORRHOEAE BY PCR: NEGATIVE

## 2014-10-29 LAB — SICKLE CELL SCREEN: SICKLE CELL SCREEN: NEGATIVE

## 2014-10-31 ENCOUNTER — Telehealth: Payer: Self-pay | Admitting: Obstetrics and Gynecology

## 2014-10-31 NOTE — Telephone Encounter (Signed)
PT IS [redacted] WEEKS PREGNANT HAS DONE HER NURSE OB INTAKE AND WANTS TO SEE MNB, SHE HAS AN APPT LATER ON TO SEE HER, BUT SHE CALLED IN BECAUSE SHE IS HAVING AN INCREASE IN DISCHARGE WITH A SLIGHT ODOR AND SHE WANTED TO KNOW IF THIS IS NORMAL WITH PREGNANCY OR NOT. PT WOULD LIKE A CALL BACK.

## 2014-10-31 NOTE — Telephone Encounter (Signed)
Notified pt she voiced understanding 

## 2014-10-31 NOTE — Telephone Encounter (Signed)
It can be normal due to elevated pregnancy hormones. OK to use OTC repHresh once a week, if odor worsens or sees any spotting let us know.

## 2014-10-31 NOTE — Telephone Encounter (Signed)
Should pt be seen?

## 2014-11-06 ENCOUNTER — Telehealth: Payer: Self-pay | Admitting: Obstetrics and Gynecology

## 2014-11-06 ENCOUNTER — Encounter: Payer: Self-pay | Admitting: Obstetrics and Gynecology

## 2014-11-06 NOTE — Telephone Encounter (Signed)
Please let her know I looked it up and see no contraindications in pregnancy

## 2014-11-06 NOTE — Telephone Encounter (Signed)
pls advise

## 2014-11-06 NOTE — Telephone Encounter (Signed)
Called pt and notified of [facility] response

## 2014-11-06 NOTE — Telephone Encounter (Signed)
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 She needs to know if it is safe to stay in that dept.

## 2014-11-12 ENCOUNTER — Other Ambulatory Visit: Payer: Self-pay | Admitting: Family Medicine

## 2014-11-12 NOTE — Telephone Encounter (Signed)
Patient requesting refill. 

## 2014-11-16 ENCOUNTER — Encounter: Payer: Self-pay | Admitting: Obstetrics and Gynecology

## 2014-11-16 ENCOUNTER — Ambulatory Visit (INDEPENDENT_AMBULATORY_CARE_PROVIDER_SITE_OTHER): Payer: Managed Care, Other (non HMO) | Admitting: Obstetrics and Gynecology

## 2014-11-16 VITALS — BP 143/82 | HR 91 | Wt 285.7 lb

## 2014-11-16 DIAGNOSIS — Z3491 Encounter for supervision of normal pregnancy, unspecified, first trimester: Secondary | ICD-10-CM

## 2014-11-16 LAB — POCT URINALYSIS DIPSTICK
BILIRUBIN UA: NEGATIVE
Blood, UA: NEGATIVE
GLUCOSE UA: NEGATIVE
KETONES UA: NEGATIVE
LEUKOCYTES UA: NEGATIVE
Nitrite, UA: NEGATIVE
Spec Grav, UA: 1.015
Urobilinogen, UA: 0.2
pH, UA: 6.5

## 2014-11-16 NOTE — Progress Notes (Signed)
NEW OB HISTORY AND PHYSICAL  SUBJECTIVE:       Michelle Ware is a 34 y.o. G1P0 female, Patient's last menstrual period was 09/05/2014 (exact date)., Estimated Date of Delivery: 06/12/15, [redacted]w[redacted]d, presents today for establishment of Prenatal Care. She has no unusual complaints and complains of fatigue      Gynecologic History Patient's last menstrual period was 09/05/2014 (exact date). Normal Contraception: none Last Pap: 2016. Results were: normal  Obstetric History OB History  Gravida Para Term Preterm AB SAB TAB Ectopic Multiple Living  1             # Outcome Date GA Lbr Len/2nd Weight Sex Delivery Anes PTL Lv  1 Current             Obstetric Comments  1st Menstrual Cycle:  11  1st Pregnancy: 0    Past Medical History  Diagnosis Date  . Hypertension   . Polycystic ovarian syndrome   . Goiter   . Gross hematuria   . Herpes, genital   . Low grade squamous intraepithelial lesion (LGSIL)   . Polycystic ovaries   . Morbid obesity with BMI of 40.0-44.9, adult ([place])   . Vitamin D deficiency   . Dermatophytosis of foot     Past Surgical History  Procedure Laterality Date  . Mouth surgery  1996    Current Outpatient Prescriptions on File Prior to Visit  Medication Sig Dispense Refill  . methyldopa (ALDOMET) 250 MG tablet Take 1 tablet (250 mg total) by mouth 3 (three) times daily. 90 tablet 0  . Prenatal Vit-Fe Fumarate-FA (NAT-RUL PRENATAL VITAMINS PO) Take 1 tablet by mouth.    . valACYclovir (VALTREX) 500 MG tablet TAKE 1 TABLET BY MOUTH DAILY FOR PREVENTION AND THREE TIMES DAILY FOR OUTBREAKS  12   No current facility-administered medications on file prior to visit.    No Known Allergies  Social History   Social History  . Marital Status: Single    Spouse Name: N/A  . Number of Children: N/A  . Years of Education: N/A   Occupational History  . Not on file.   Social History Main Topics  . Smoking status: Never Smoker   . Smokeless tobacco: Never Used  .  Alcohol Use: 0.0 oz/week    0 Standard drinks or equivalent per week     Comment: 2/week  . Drug Use: No  . Sexual Activity:    Partners: Male   Other Topics Concern  . Not on file   Social History Narrative    Family History  Problem Relation Age of Onset  . Thyroid disease Mother   . Hypertension Mother     The following portions of the patient's history were reviewed and updated as appropriate: allergies, current medications, past OB history, past medical history, past surgical history, past family history, past social history, and problem list.    OBJECTIVE: Initial Physical Exam (New OB)  GENERAL APPEARANCE: alert, well appearing, in no apparent distress, oriented to person, place and time, overweight HEAD: normocephalic, atraumatic MOUTH: mucous membranes moist, pharynx normal without lesions THYROID: no thyromegaly or masses present BREASTS: no masses noted, no significant tenderness, no palpable axillary nodes, no skin changes LUNGS: clear to auscultation, no wheezes, rales or rhonchi, symmetric air entry HEART: regular rate and rhythm, no murmurs ABDOMEN: soft, nontender, nondistended, no abnormal masses, no epigastric pain, obese, fundus not palpable and fundus soft, nontender 9 weeks size EXTREMITIES: no redness or tenderness in the calves or thighs,  no edema SKIN: normal coloration and turgor, no rashes LYMPH NODES: no adenopathy palpable NEUROLOGIC: alert, oriented, normal speech, no focal findings or movement disorder noted  PELVIC EXAM EXTERNAL GENITALIA: normal appearing vulva with no masses, tenderness or lesions UTERUS: gravid and consistent with 9 weeks- retroverted ADNEXA: no masses palpable and nontender  ASSESSMENT: Early pregnancy AMA Hypertension Obesity  PLAN: Prenatal care with MD Will schedule viability scan  See orders

## 2014-11-16 NOTE — Progress Notes (Signed)
NOB-pt is having slight headaches,nausea has gotten better

## 2014-11-19 ENCOUNTER — Encounter: Payer: Self-pay | Admitting: Family Medicine

## 2014-11-19 ENCOUNTER — Ambulatory Visit (INDEPENDENT_AMBULATORY_CARE_PROVIDER_SITE_OTHER): Payer: Managed Care, Other (non HMO) | Admitting: Family Medicine

## 2014-11-19 VITALS — BP 142/62 | HR 102 | Temp 98.2°F | Resp 18 | Ht 70.0 in | Wt 287.9 lb

## 2014-11-19 DIAGNOSIS — I10 Essential (primary) hypertension: Secondary | ICD-10-CM

## 2014-11-19 DIAGNOSIS — Z3A10 10 weeks gestation of pregnancy: Secondary | ICD-10-CM

## 2014-11-19 MED ORDER — METHYLDOPA 500 MG PO TABS: 500.0000 mg | ORAL_TABLET | Freq: Three times a day (TID) | ORAL | Status: DC

## 2014-11-19 NOTE — Progress Notes (Signed)
Name: Michelle Ware   MRN: [id number]    DOB: 12/16/1980   Date:11/19/2014       Progress Note  Subjective  Chief Complaint  Chief Complaint  Patient presents with  . Hypertension  . URI    HPI  HTN: used to take HCTZ but when we found out she was pregnant in September 2016 , we switched medication to Methyldopa. She has been taking three times daily, denies side effects, but bp still not at goal today.    URI: she states symptoms started about one week ago, with sinus burning, and a scratchy throat, followed by a dry cough, nasal congestion and rhinorrhea. No fever, took some kids Robitussin and using cough lozenges and humidifier without much help. She states she was exposed to a viral illness from her little cousin and nephew ( 57 years old each ) No fever or change in appetite.    Patient Active Problem List   Diagnosis Date Noted  . Hypertension, benign 07/24/2014  . Essential (primary) hypertension 07/24/2014  . Decreased thyroid stimulating hormone (TSH) level 07/24/2014  . Extreme obesity ([place]) 07/24/2014  . 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 Vitamin D deficiency 07/24/2014  . Thyromegaly 07/24/2014  . PCOS (polycystic ovarian syndrome) 07/24/2014  . History of abnormal cervical Pap smear 11/24/2011  . Genital herpes 11/29/2008    Past Surgical History  Procedure Laterality Date  . Mouth surgery  1996    Family History  Problem Relation Age of Onset  . Thyroid disease Mother   . Hypertension Mother     Social History   Social History  . Marital Status: Single    Spouse Name: N/A  . Number of Children: N/A  . Years of Education: N/A   Occupational History  . Not on file.   Social History Main Topics  . Smoking status: Never Smoker   . Smokeless tobacco: Never Used  . Alcohol Use: 0.0 oz/week    0 Standard drinks or equivalent per week     Comment: 2/week  . Drug Use: No  . Sexual Activity:    Partners: Male   Other Topics Concern  . Not on file    Social History Narrative     Current outpatient prescriptions:  .  methyldopa (ALDOMET) 500 MG tablet, Take 1 tablet (500 mg total) by mouth 3 (three) times daily., Disp: 90 tablet, Rfl: 0 .  Prenatal Vit-Fe Fumarate-FA (NAT-RUL PRENATAL VITAMINS PO), Take 1 tablet by mouth., Disp: , Rfl:  .  valACYclovir (VALTREX) 500 MG tablet, TAKE 1 TABLET BY MOUTH DAILY FOR PREVENTION AND THREE TIMES DAILY FOR OUTBREAKS, Disp: , Rfl: 12  No Known Allergies   ROS  Constitutional: Negative for fever or weight change. Feeling very tired Respiratory: Positive  for cough, no  shortness of breath.   Cardiovascular: Negative for chest pain or palpitations.  Gastrointestinal: Negative for abdominal pain, no bowel changes.  Musculoskeletal: Negative for gait problem or joint swelling.  GYN: no cramping or spotting Skin: Negative for rash.  Neurological: Negative for dizziness or headache.  No other specific complaints in a complete review of systems (except as listed in HPI above).  Objective  Filed Vitals:   11/19/14 1415  BP: 142/62  Pulse: 102  Temp: 98.2 F (36.8 C)  TempSrc: Oral  Resp: 18  Height: 5\' 10"  (1.778 m)  Weight: 287 lb 14.4 oz (130.591 kg)  SpO2: 98%    Body mass index is 41.31 kg/(m^2).  Physical Exam  Constitutional:  Patient appears well-developed and well-nourished. Obese No distress.  HEENT: head atraumatic, normocephalic, pupils equal and reactive to light, neck supple, throat within normal limits, boggy turbinates, clear rhinorrhea Cardiovascular: Normal rate, regular rhythm and normal heart sounds.  No murmur heard. No BLE edema. Pulmonary/Chest: Effort normal and breath sounds normal. No respiratory distress. Abdominal: Soft.  There is no tenderness. Psychiatric: Patient has a normal mood and affect. behavior is normal. Judgment and thought content normal.  Recent Results (from the past 2160 hour(s))  POCT urine pregnancy     Status: Abnormal   Collection  Time: 10/17/14  8:15 AM  Result Value Ref Range   Preg Test, Ur Positive (A) Negative  ABO AND RH      Status: None   Collection Time: 10/26/14  4:48 PM  Result Value Ref Range   ABO Grouping O    Rh Factor Positive     Comment: Please note: Prior records for this patient's ABO / Rh type are not available for additional verification.   Antibody screen     Status: None   Collection Time: 10/26/14  4:48 PM  Result Value Ref Range   Antibody Screen Negative Negative  CBC with Differential/Platelet     Status: Abnormal   Collection Time: 10/26/14  4:48 PM  Result Value Ref Range   WBC 8.0 3.4 - 10.8 x10E3/uL   RBC 4.30 3.77 - 5.28 x10E6/uL   Hemoglobin 11.0 (L) 11.1 - 15.9 g/dL   Hematocrit 33.9 (L) 34.0 - 46.6 %   MCV 79 79 - 97 fL   MCH 25.6 (L) 26.6 - 33.0 pg   MCHC 32.4 31.5 - 35.7 g/dL   RDW 15.3 12.3 - 15.4 %   Platelets 267 150 - 379 x10E3/uL   Neutrophils 54 %   Lymphs 36 %   Monocytes 9 %   Eos 1 %   Basos 0 %   Neutrophils Absolute 4.3 1.4 - 7.0 x10E3/uL   Lymphocytes Absolute 2.9 0.7 - 3.1 x10E3/uL   Monocytes Absolute 0.8 0.1 - 0.9 x10E3/uL   EOS (ABSOLUTE) 0.1 0.0 - 0.4 x10E3/uL   Basophils Absolute 0.0 0.0 - 0.2 x10E3/uL   Immature Granulocytes 0 %   Immature Grans (Abs) 0.0 0.0 - 0.1 x10E3/uL  GC/Chlamydia Probe Amp     Status: None   Collection Time: 10/26/14  4:48 PM  Result Value Ref Range   Chlamydia trachomatis, NAA Negative Negative   Neisseria gonorrhoeae by PCR Negative Negative  Hepatitis B surface antigen     Status: None   Collection Time: 10/26/14  4:48 PM  Result Value Ref Range   Hepatitis B Surface Ag Negative Negative  HIV antibody     Status: None   Collection Time: 10/26/14  4:48 PM  Result Value Ref Range   HIV Screen 4th Generation wRfx Non Reactive Non Reactive  RPR     Status: None   Collection Time: 10/26/14  4:48 PM  Result Value Ref Range   RPR Ser Ql Non Reactive Non Reactive  Rubella screen     Status: None   Collection  Time: 10/26/14  4:48 PM  Result Value Ref Range   Rubella Antibodies, IGG 1.33 Immune >0.99 index    Comment:                                 Non-immune       <0.90  Equivocal  0.90 - 0.99                                 Immune           >0.99   Sickle cell screen     Status: None   Collection Time: 10/26/14  4:48 PM  Result Value Ref Range   Sickle Cell Screen Negative Negative    Comment: Since a variety of conditions and other abnormal hemoglobins in addition to Hemoglobin S may give false-positive results, positive Hemoglobin Solubility tests should be confirmed by hemoglobin fractionation testing.   Urinalysis, Routine w reflex microscopic (not at [facility])     Status: Abnormal   Collection Time: 10/26/14  4:48 PM  Result Value Ref Range   Specific Gravity, UA 1.023 1.005 - 1.030   pH, UA 6.5 5.0 - 7.5   Color, UA Yellow Yellow   Appearance Ur Clear Clear   Leukocytes, UA 1+ (A) Negative   Protein, UA Negative Negative/Trace   Glucose, UA Negative Negative   Ketones, UA Negative Negative   RBC, UA Negative Negative   Bilirubin, UA Negative Negative   Urobilinogen, Ur 0.2 0.2 - 1.0 mg/dL   Nitrite, UA Negative Negative   Microscopic Examination See below:     Comment: Microscopic was indicated and was performed.  Urine culture     Status: None   Collection Time: 10/26/14  4:48 PM  Result Value Ref Range   Urine Culture, Routine Final report    Urine Culture result 1 Comment     Comment: Culture shows less than 10,000 colony forming units of bacteria per milliliter of urine. This colony count is not generally considered to be clinically significant.   Varicella zoster antibody, IgG     Status: None   Collection Time: 10/26/14  4:48 PM  Result Value Ref Range   Varicella zoster IgG 3075 Immune >165 index    Comment:                                Negative          <135                                Equivocal    135 - 165                                 Positive          >165 A positive result generally indicates exposure to the pathogen or administration of specific immunoglobulins, but it is not indication of active infection or stage of disease.   TSH     Status: None   Collection Time: 10/26/14  4:48 PM  Result Value Ref Range   TSH 0.480 0.450 - 4.500 uIU/mL  Hemoglobin A1c     Status: None   Collection Time: 10/26/14  4:48 PM  Result Value Ref Range   Hgb A1c MFr Bld 5.3 4.8 - 5.6 %    Comment:          Pre-diabetes: 5.7 - 6.4          Diabetes: >6.4          Glycemic control for adults with diabetes: <  7.0    Est. average glucose Bld gHb Est-mCnc 105 mg/dL  Microscopic Examination     Status: None   Collection Time: 10/26/14  4:48 PM  Result Value Ref Range   WBC, UA 0-5 0 -  5 /hpf   RBC, UA 0-2 0 -  2 /hpf   Epithelial Cells (non renal) 0-10 0 - 10 /hpf   Casts None seen None seen /lpf   Mucus, UA Present Not Estab.   Bacteria, UA Few None seen/Few  POCT urinalysis dipstick     Status: None   Collection Time: 11/16/14  4:29 PM  Result Value Ref Range   Color, UA pale yellow    Clarity, UA clear    Glucose, UA neg    Bilirubin, UA neg    Ketones, UA neg    Spec Grav, UA 1.015    Blood, UA neg    pH, UA 6.5    Protein, UA trace    Urobilinogen, UA 0.2    Nitrite, UA neg    Leukocytes, UA Negative Negative    PHQ2/9: Depression screen [facility] 2/9 10/17/2014 07/24/2014  Decreased Interest 0 0  Down, Depressed, Hopeless 0 0  PHQ - 2 Score 0 0     Fall Risk: Fall Risk  10/17/2014 07/24/2014  Falls in the past year? No No     Assessment & Plan  1. Hypertension, benign  Adjust dose, keep follow up with OB, continue prenatal vitamins. - methyldopa (ALDOMET) 500 MG tablet; Take 1 tablet (500 mg total) by mouth 3 (three) times daily.  Dispense: 90 tablet; Refill: 0  2. [redacted] weeks gestation of pregnancy  Continue follow up with Dr. Marcelline Mates

## 2014-11-20 ENCOUNTER — Ambulatory Visit: Payer: Managed Care, Other (non HMO)

## 2014-11-20 ENCOUNTER — Ambulatory Visit (INDEPENDENT_AMBULATORY_CARE_PROVIDER_SITE_OTHER): Payer: Managed Care, Other (non HMO) | Admitting: Obstetrics and Gynecology

## 2014-11-20 ENCOUNTER — Other Ambulatory Visit: Payer: Self-pay | Admitting: Obstetrics and Gynecology

## 2014-11-20 VITALS — BP 146/81 | HR 81 | Wt 287.1 lb

## 2014-11-20 DIAGNOSIS — Z3491 Encounter for supervision of normal pregnancy, unspecified, first trimester: Secondary | ICD-10-CM

## 2014-11-20 DIAGNOSIS — O200 Threatened abortion: Secondary | ICD-10-CM

## 2014-11-21 NOTE — Progress Notes (Signed)
OBSTETRIC CLINIC PROGRESS NOTE Subjective:    Michelle Ware is a 34 y.o. G1P0 female being seen today for f/u after ultrasound performed for viability. She is at [redacted]w[redacted]d gestation, with Estimated Date of Delivery: 06/12/15. Patient denies symptoms.  Patient's last menstrual period was 09/05/2014 (exact date).     Menstrual History: OB History    Gravida Para Term Preterm AB TAB SAB Ectopic Multiple Living   1               Obstetric Comments   1st Menstrual Cycle:  13 1st Pregnancy: 0      The following portions of the patient's history were reviewed and updated as appropriate: allergies, current medications, past family history, past medical history, past social history, past surgical history and problem list.  Review of Systems A comprehensive review of systems was negative.   Objective:     BP 146/81 mmHg  Pulse 81  Wt 287 lb 1.6 oz (130.228 kg)  LMP 09/05/2014 (Exact Date) Physical Exam deferred.    Imaging (TVUS on 11/20/2014):  Findings:  Fetal pole NOT visualized with a gestational sac measurement consistent with 6 6/[redacted] weeks gestation, giving an (U/S) EDD of 07/10/15. The (U/S) EDD is NOT consistent with the clinically established (LMP) EDD of 06/12/15.  FHR: not detected CRL not present Yolk sac and and early anatomy was not seen.  Right Ovary was not visualized. Left Ovary measures 3.9 x 2.4 x 2cm. It is normal appearance. There is evidence of a corpus luteal cyst in the left ovary. Large uterine fibroid seen in fundus measures 7.7 x 8.9 x 7cm Survey of the adnexa demonstrates no adnexal masses. There is no free peritoneal fluid in the cul de sac.  Impression: 1. Fetal pole NOT visualized with a gestational sac measurement consistent with 6 6/[redacted] weeks gestation, giving an (U/S) EDD of 07/10/15. 2. The (U/S) EDD is NOT consistent with the clinically established (LMP) EDD of 06/12/15. 3. Large uterine fibroid seen in fundus measures 7.7 x 8.9 x 7cm  Assessment:    Pregnancy 10 and 6/7 weeks   Fibroid uterus  Missed Ab vs early IUP cHTN (currently on HCTZ) Advanced maternal age.   Plan:   Discussion had with patient regarding ultrasound results.  Pregnancy currently measuring at 6.6 weeks (inconsistent with dates), gestational sac without fetal pole seen.  Also with large fibroid.  Discussed possibility of missed Ab vs early IUP.  Will order BHCG today, and repeat in 2 days.  If quants are abnormal, more than likely diagnosis of missed Ab.  If rising appropriately, would recommend repeat sono in 1-2 weeks to attempt to confirm viability again.  Patient notes understanding.  If pregnancy continues, will further discuss role of ultrasound in pregnancy for monitoring fibroid growth, HTN in pregnancy and need for antenatal testing and growth scans, and recommendations regarding genetic testing for AMA status.  Can continue HCTZ as prescribed for now.   >50% of 15 minute visit spent on counseling and coordination of care.   Rubie Maid, MD [facility]

## 2014-11-22 ENCOUNTER — Other Ambulatory Visit: Payer: Self-pay | Admitting: Obstetrics and Gynecology

## 2014-11-22 ENCOUNTER — Other Ambulatory Visit: Payer: Managed Care, Other (non HMO)

## 2014-11-22 ENCOUNTER — Telehealth: Payer: Self-pay | Admitting: Obstetrics and Gynecology

## 2014-11-22 LAB — BETA HCG QUANT (REF LAB): HCG QUANT: 14834 m[IU]/mL

## 2014-11-22 NOTE — Telephone Encounter (Signed)
PT CALLED AND WAS HERE EALIER THIS WEEK AND SHE WAS TOLD THAT BABY NOT VIABLE, SHE STARTED SPOTTING AND SHE CALLED IN TO LET us KNOW THAT AND ALSO SHE WANTED TO KNOW IF SHE NEEDED TO COME IN OR NOT.

## 2014-11-22 NOTE — Telephone Encounter (Signed)
I would still like for her to come in for her repeat BHCG.  Advised on resting and can take Tylenol if any cramping begins.  To let us know if heavier bleeding occurs.

## 2014-11-22 NOTE — Telephone Encounter (Signed)
Pt was seen earlier this week, for u/s. At that time there was no fetal pole and sac was inconsistent with dates. Pt states she is now spotting, likely with missed ab. How would you like me to proceed? Please advise.

## 2014-11-23 LAB — BETA HCG QUANT (REF LAB): hCG Quant: 9859 m[IU]/mL

## 2014-11-23 NOTE — Telephone Encounter (Signed)
Pt returned call, advised pt on the likeness of missed AB. Advised pt to rest as much as possible, take tylenol for any pain. Pt denies pain at this time, states that bleeding is still very minimal. Advised pt to call office if bleeding increases. Pt had repeat beta yesterday that shows downward trend.

## 2014-11-23 NOTE — Telephone Encounter (Signed)
Called pt, no answer. Unable to leave message as voicemail box is full.

## 2014-11-28 ENCOUNTER — Ambulatory Visit (INDEPENDENT_AMBULATORY_CARE_PROVIDER_SITE_OTHER): Payer: Managed Care, Other (non HMO) | Admitting: Obstetrics and Gynecology

## 2014-11-28 VITALS — BP 173/92 | HR 93 | Wt 289.3 lb

## 2014-11-28 DIAGNOSIS — E66813 Obesity, class 3: Secondary | ICD-10-CM

## 2014-11-28 DIAGNOSIS — O021 Missed abortion: Secondary | ICD-10-CM

## 2014-11-28 DIAGNOSIS — D259 Leiomyoma of uterus, unspecified: Secondary | ICD-10-CM

## 2014-11-28 DIAGNOSIS — I10 Essential (primary) hypertension: Secondary | ICD-10-CM | POA: Insufficient documentation

## 2014-11-28 MED ORDER — HYDROCODONE-ACETAMINOPHEN 5-325 MG PO TABS: 1.0000 | ORAL_TABLET | Freq: Four times a day (QID) | ORAL | Status: DC | PRN

## 2014-11-28 MED ORDER — MISOPROSTOL 200 MCG PO TABS: 800.0000 ug | ORAL_TABLET | Freq: Once | ORAL | Status: DC

## 2014-11-28 MED ORDER — HYDROCHLOROTHIAZIDE 12.5 MG PO TABS: 12.5000 mg | ORAL_TABLET | Freq: Every day | ORAL | Status: DC

## 2014-11-28 NOTE — Progress Notes (Signed)
GYNECOLOGY PROGRESS NOTE  Subjective:    Patient ID: Michelle Ware, female    DOB: 1981-01-04, 34 y.o.   MRN: [id number]  HPI  Patient is a 34 y.o. G1P0 female who presents for follow up of suspected missed AB.  Patient with vaginal spotting, following BHCG levels.  Notes that vaginal spotting has increased to light bleeding since last visit. Denies passage of clots or tissue products.   The following portions of the patient's history were reviewed and updated as appropriate: allergies, current medications, past family history, past medical history, past social history, past surgical history and problem list.  Review of Systems Pertinent items noted in HPI and remainder of comprehensive ROS otherwise negative.   Objective:   Blood pressure 173/92, pulse 93, weight 289 lb 4.8 oz (131.226 kg), last menstrual period 09/05/2014. Body mass index is 41.51 kg/(m^2).  General appearance: alert and no distress, obese Abdomen: soft, non-tender; bowel sounds normal; no masses,  no organomegaly Pelvic: cervix normal in appearance, external genitalia normal, no cervical motion tenderness, rectovaginal septum normal, uterus normal size, shape, and consistency and vagina normal without discharge, small amount of blood in vaginal vault. Cervix closed.  Extremities: extremities normal, atraumatic, no cyanosis or edema Neurologic: Grossly normal   Labs: O+/-  Results for MAEBY, VANKLEECK (MRN [id number]) as of 11/28/2014 19:06  Ref. Range 11/20/2014 12:00 11/22/2014 10:00  hCG Quant Latest Units: mIU/mL 14834 9859   CBC    Component Value Date/Time   WBC 8.0 10/26/2014 1648   RBC 4.30 10/26/2014 1648   HCT 33.9* 10/26/2014 1648   MCH 25.6* 10/26/2014 1648   MCHC 32.4 10/26/2014 1648   RDW 15.3 10/26/2014 1648   LYMPHSABS 2.9 10/26/2014 1648   BASOSABS 0.0 10/26/2014 1648     Imaging: Pelvic Ultrasound 11/20/2014 Impression: 1. Fetal pole NOT visualized with a gestational sac measurement consistent  with 6 6/[redacted] weeks gestation, giving an (U/S) EDD of 07/10/15. 2. The (U/S) EDD is NOT consistent with the clinically established (LMP) EDD of 06/12/15. 3. Large uterine fibroid seen in fundus measures 7.7 x 8.9 x 7cm   Assessment:   Missed Ab Fibroid uterus cHTN Obesity  Plan:   - Patient with declining BHCG levels.  Currently with missed Ab.  Discussed diagnosis, and management of missed abortion: expectant management vs misoprostol vs D&C.  Risks and benefits of all modalities discussed; all questions answered.  Patient opted for medical management with Cytotec. Bleeding precautions reviewed; she was told to call clinic or go to Emergency Room for any concerns.  Risks and benefits of medical management were carefully explained, including a success rate of 80-90%. She will follow up in one week after misoprostol administration for ultrasound; if there has been no passage of pregnancy, will consider repeat misoprostol vs D&E.  Will notify patient by phone of results. Patient should again be advised to call or come in for evaluation for any concerning symptoms; bleeding precautions should be strictly emphasized.    - Fibroid uterus - large fibroid noted near fundal area, newly diagnosed at most recent ultrasound.  Currently asymptomatic.  Will investigate further if intramural, submucosal, or subserosal.  Discussed that sometimes fibroids can lead to miscarriages and difficulties in conception depending on location and size.  To discuss further when patient desires to conceive again, as part of preconception counseling.   - cHTN.  Was switched from HCTZ to Methyldopa at discovery of pregnancy.  Previously controlled, however BPs elevated over past 2 visits.  Can resume  use of HCTZ and discontinue Methyldopa.  Patient needs new prescription called in.  Will order.  - Obesity.  BMI of 41.  Discussion had on weight management and weight loss prior to next attempts at conception.  Will discuss further  after next visit.   A total of 15 minutes were spent face-to-face with the patient during this encounter and over half of that time dealt with counseling and coordination of care.  Rubie Maid, MD [facility]

## 2014-11-28 NOTE — Patient Instructions (Signed)
Miscarriage  A miscarriage is the sudden loss of an unborn baby (fetus) before the 20th week of pregnancy. Most miscarriages happen in the first 3 months of pregnancy. Sometimes, it happens before a woman even knows she is pregnant. A miscarriage is also called a "spontaneous miscarriage" or "early pregnancy loss." Having a miscarriage can be an emotional experience. Talk with your caregiver about any questions you may have about miscarrying, the grieving process, and your future pregnancy plans.  CAUSES    Problems with the fetal chromosomes that make it impossible for the baby to develop normally. Problems with the baby's genes or chromosomes are most often the result of errors that occur, by chance, as the embryo divides and grows. The problems are not inherited from the parents.   Infection of the cervix or uterus.    Hormone problems.    Problems with the cervix, such as having an incompetent cervix. This is when the tissue in the cervix is not strong enough to hold the pregnancy.    Problems with the uterus, such as an abnormally shaped uterus, uterine fibroids, or congenital abnormalities.    Certain medical conditions.    Smoking, drinking alcohol, or taking illegal drugs.    Trauma.   Often, the cause of a miscarriage is unknown.   SYMPTOMS    Vaginal bleeding or spotting, with or without cramps or pain.   Pain or cramping in the abdomen or lower back.   Passing fluid, tissue, or blood clots from the vagina.  DIAGNOSIS   Your caregiver will perform a physical exam. You may also have an ultrasound to confirm the miscarriage. Blood or urine tests may also be ordered.  TREATMENT    Sometimes, treatment is not necessary if you naturally pass all the fetal tissue that was in the uterus. If some of the fetus or placenta remains in the body (incomplete miscarriage), tissue left behind may become infected and must be removed. Usually, a dilation and curettage (D and C) procedure is performed.  During a D and C procedure, the cervix is widened (dilated) and any remaining fetal or placental tissue is gently removed from the uterus.   Antibiotic medicines are prescribed if there is an infection. Other medicines may be given to reduce the size of the uterus (contract) if there is a lot of bleeding.   If you have Rh negative blood and your baby was Rh positive, you will need a Rh immunoglobulin shot. This shot will protect any future baby from having Rh blood problems in future pregnancies.  HOME CARE INSTRUCTIONS    Your caregiver may order bed rest or may allow you to continue light activity. Resume activity as directed by your caregiver.   Have someone help with home and family responsibilities during this time.    Keep track of the number of sanitary pads you use each day and how soaked (saturated) they are. Write down this information.    Do not use tampons. Do not douche or have sexual intercourse until approved by your caregiver.    Only take over-the-counter or prescription medicines for pain or discomfort as directed by your caregiver.    Do not take aspirin. Aspirin can cause bleeding.    Keep all follow-up appointments with your caregiver.    If you or your partner have problems with grieving, talk to your caregiver or seek counseling to help cope with the pregnancy loss. Allow enough time to grieve before trying to get pregnant again.     SEEK IMMEDIATE MEDICAL CARE IF:    You have severe cramps or pain in your back or abdomen.   You have a fever.   You pass large blood clots (walnut-sized or larger) ortissue from your vagina. Save any tissue for your caregiver to inspect.    Your bleeding increases.    You have a thick, bad-smelling vaginal discharge.   You become lightheaded, weak, or you faint.    You have chills.   MAKE SURE YOU:   Understand these instructions.   Will watch your condition.   Will get help right away if you are not doing well or get worse.     This  information is not intended to replace advice given to you by your health care provider. Make sure you discuss any questions you have with your health care provider.     Document Released: 07/01/2000 Document Revised: 05/02/2012 Document Reviewed: 02/24/2011  [employer]vier Interactive Patient Education 2016 [employer]

## 2014-11-29 ENCOUNTER — Inpatient Hospital Stay: Admission: RE | Admit: 2014-11-29 | Payer: Managed Care, Other (non HMO) | Source: Ambulatory Visit

## 2014-11-29 NOTE — Progress Notes (Signed)
Patient contacted office today, and noted that she no longer desired to do medical management for missed AB.  Would like to proceed with surgical management.  Previously counseled on surgical option yesterday.  Patient to be scheduled for 12/03/14.

## 2014-11-29 NOTE — H&P (Addendum)
Subjective:    Patient is a 34 y.o. G1P0 female scheduled for suction D&C. Indications for procedure are missed AB at [redacted] weeks gestation, declines medical management.   Pertinent Gynecological History: Menses: flow is moderate Bleeding: spotting since last week  Discussed Blood/Blood Products: no   Menstrual History: OB History    Gravida Para Term Preterm AB TAB SAB Ectopic Multiple Living   1               Obstetric Comments   1st Menstrual Cycle:  13 1st Pregnancy: 0      Menarche age: 68 Patient's last menstrual period was 09/05/2014 (exact date).    Past Medical History  Diagnosis Date  . Hypertension   . Polycystic ovarian syndrome   . Goiter   . Gross hematuria   . Herpes, genital   . Low grade squamous intraepithelial lesion (LGSIL)   . Polycystic ovaries   . Morbid obesity with BMI of 40.0-44.9, adult ([place])   . Vitamin D deficiency   . Dermatophytosis of foot     Past Surgical History  Procedure Laterality Date  . Mouth surgery  1996    OB History  Gravida Para Term Preterm AB SAB TAB Ectopic Multiple Living  1             # Outcome Date GA Lbr Len/2nd Weight Sex Delivery Anes PTL Lv  1 Current             Obstetric Comments  1st Menstrual Cycle:  13  1st Pregnancy: 0    Social History   Social History  . Marital Status: Single    Spouse Name: N/A  . Number of Children: N/A  . Years of Education: N/A   Social History Main Topics  . Smoking status: Never Smoker   . Smokeless tobacco: Never Used  . Alcohol Use: 0.0 oz/week    0 Standard drinks or equivalent per week     Comment: 2/week  . Drug Use: No  . Sexual Activity:    Partners: Male   Other Topics Concern  . Not on file   Social History Narrative    Family History  Problem Relation Age of Onset  . Thyroid disease Mother   . Hypertension Mother      (Not in a hospital admission)  No Known Allergies  Review of Systems Constitutional: No recent  fever/chills/sweats Respiratory: No recent cough/bronchitis Cardiovascular: No chest pain Gastrointestinal: No recent nausea/vomiting/diarrhea Genitourinary: No UTI symptoms.  Light vaginal bleeding x 5 days. Hematologic/lymphatic:No history of coagulopathy or recent blood thinner use    Objective:    BP 173/92 mmHg  Pulse 93  Wt 289 lb 4.8 oz (131.226 kg)  LMP 09/05/2014 (Exact Date) Body mass index is 41.51 kg/(m^2).   General:   alert and in no acute distress, obese  Skin:   normal  HEENT:  Normal   Neck:  Supple without Adenopathy or Thyromegaly  Lungs:   Heart:              Breasts:   Abdomen:  Pelvis:  M/S   Extremeties:  Neuro:    clear to auscultation bilaterally   Normal without murmur   Not Examined   soft, non-tender; bowel sounds normal; no masses,  no organomegaly   Exam deferred to OR  No CVAT  Warm/Dry   Normal          Assessment:      Missed Ab at 6 weeks Uncontrolled  HTN Obesity Uterine fibroids Advanced maternal age in primip   Plan:    Counseling: Procedure, risks, reasons, benefits and complications (including injury to bowel, bladder, major blood vessel, ureter, bleeding, possibility of transfusion, infection, or fistula formation) reviewed in detail. Consent signed.  Scheduled for 12/03/2014. Preop testing ordered. Instructions reviewed, including NPO after midnight. Patient to resume HCTZ and discontinue Methyldopa Will have patient scheduled for pre-conception counseling at future visit to discuss management of HTN, obesity, AMA status, and fibroids prior to next pregnancy.    Rubie Maid, MD [facility]

## 2014-11-29 NOTE — Addendum Note (Signed)
Addended by: Augusto Gamble on: 11/29/2014 02:09 PM   Modules accepted: Orders

## 2014-11-30 ENCOUNTER — Telehealth: Payer: Self-pay | Admitting: Obstetrics and Gynecology

## 2014-11-30 NOTE — Telephone Encounter (Signed)
Spoke with pt she states that she has began bleeding and wants to know if she should proceed with taking cytotec. Pt states that she is soaking a pad every 1-2hours. Advised pt that per Dr.cherry she will have heavy bleeding for 1-2 days at which time bleeding should lessen. Advised pt to take prescription that she was previously given for pain if need be (Norco). Pt to follow up in one week for u/s. 12/07/14 @ 3:30pm.

## 2014-11-30 NOTE — Telephone Encounter (Signed)
Pt has to decided to take prescription instead of D&C she just wanted you to know

## 2014-12-03 ENCOUNTER — Encounter: Admission: RE | Payer: Self-pay | Source: Ambulatory Visit

## 2014-12-03 ENCOUNTER — Ambulatory Visit
Admission: RE | Admit: 2014-12-03 | Payer: Managed Care, Other (non HMO) | Source: Ambulatory Visit | Admitting: Obstetrics and Gynecology

## 2014-12-03 SURGERY — DILATION AND EVACUATION, UTERUS
Anesthesia: Choice

## 2014-12-04 ENCOUNTER — Telehealth: Payer: Self-pay

## 2014-12-04 ENCOUNTER — Other Ambulatory Visit: Payer: Self-pay | Admitting: Family Medicine

## 2014-12-04 ENCOUNTER — Telehealth: Payer: Self-pay | Admitting: Family Medicine

## 2014-12-04 NOTE — Telephone Encounter (Signed)
Read note from Dr. Rubie Maid, OBGYN, patient had a miscarriage. Called patient passing products of conception, having pain. Stopped hydrocodone because she developed some swelling. Taking Ibuprofen prn, but advised to take it three times daily to control pain.  Also advised her to come in if she develops depression symptoms such as crying spells, anhedonia, change in sleep pattern.  She states she is okay at this time and will call us back if needed.

## 2014-12-04 NOTE — Telephone Encounter (Signed)
Called pt and left message that we were checking on her to see how she was feeling. Pt had called Dr. Ancil Boozer office today and c/o hydrocodone may be causing swelling and it was recommended she take IBP routinely for pain and to call office if any s/s depression are exhibited.

## 2014-12-07 ENCOUNTER — Ambulatory Visit: Payer: Managed Care, Other (non HMO)

## 2014-12-07 ENCOUNTER — Other Ambulatory Visit: Payer: Managed Care, Other (non HMO)

## 2014-12-07 DIAGNOSIS — O021 Missed abortion: Secondary | ICD-10-CM

## 2014-12-17 NOTE — Progress Notes (Signed)
Called pt and she stated that she was not trying to conceive this last itmw, she just got pregnant but she wanted to consult about the fibroid that was told she had.

## 2014-12-24 ENCOUNTER — Ambulatory Visit: Payer: Managed Care, Other (non HMO) | Admitting: Family Medicine

## 2015-01-22 ENCOUNTER — Encounter: Payer: Self-pay | Admitting: Obstetrics and Gynecology

## 2015-01-22 ENCOUNTER — Ambulatory Visit (INDEPENDENT_AMBULATORY_CARE_PROVIDER_SITE_OTHER): Payer: Managed Care, Other (non HMO) | Admitting: Obstetrics and Gynecology

## 2015-01-22 VITALS — BP 160/90 | HR 87 | Wt 290.9 lb

## 2015-01-22 DIAGNOSIS — Z8742 Personal history of other diseases of the female genital tract: Secondary | ICD-10-CM | POA: Diagnosis not present

## 2015-01-22 DIAGNOSIS — D259 Leiomyoma of uterus, unspecified: Secondary | ICD-10-CM

## 2015-01-22 DIAGNOSIS — I10 Essential (primary) hypertension: Secondary | ICD-10-CM | POA: Diagnosis not present

## 2015-01-22 DIAGNOSIS — Z8759 Personal history of other complications of pregnancy, childbirth and the puerperium: Secondary | ICD-10-CM

## 2015-01-22 NOTE — Patient Instructions (Signed)
Myomectomy Myomectomy is surgery to remove a noncancerous tumor (myoma) from the uterus. Myomas are tumors made up of fibrous tissue. They are often called fibroid tumors. Fibroid tumors can range from the size of a pea to the size of a grapefruit. In a myomectomy, the fibroid tumor is removed without removing the uterus. Because these tumors are rarely cancerous, this surgery is usually done only if the tumor is growing or causing symptoms such as pain, pressure, bleeding, or pain with intercourse. LET [facility] CARE PROVIDER KNOW ABOUT:  Any allergies you have.  All medicines you are taking, including vitamins, herbs, eye drops, creams, and over-the-counter medicines.  Previous problems you or members of your family have had with the use of anesthetics.  Any blood disorders you have.  Previous surgeries you have had.  Medical conditions you have. RISKS AND COMPLICATIONS  Generally, this is a safe procedure. However, as with any procedure, complications can occur. Possible complications include:  Excessive bleeding.  Infection.  Injury to nearby organs.  Blood clots in the legs, chest, or brain.  Scar tissue on other organs and in the pelvis. This may require another surgery to remove the scar tissue. BEFORE THE PROCEDURE  Ask your health care provider about changing or stopping your regular medicines. Avoid taking aspirin or blood thinners as directed by your health care provider.  Do not  eat or drink anything after midnight on the night before surgery.  If you smoke, do not  smoke for 2 weeks before the surgery.  Do not  drink alcohol the day before the surgery.  Arrange for someone to drive you home after the procedure or after your hospital stay. Also arrange for someone to help you with activities during your recovery. PROCEDURE You will be given medicine to make you sleep through the procedure (general anesthetic). Any of the following methods may be used to perform a  myomectomy:  Small monitors will be put on your body. They are used to check your heart, blood pressure, and oxygen level.  An IV access tube will be put into one of your veins. Medicine will be able to flow directly into your body through this IV tube.  You might be given a medicine to help you relax (sedative).  You will be given a medicine to make you sleep (general anesthetic). A breathing tube will be placed into your lungs during the procedure.  A thin, flexible tube (catheter) will be inserted into your bladder to collect urine.  Any of the following methods may be used to perform a myomectomy:  Hysteroscopic myomectomy--This method may be used when the fibroid tumor is inside the cavity of the uterus. A long, thin tube that is like a telescope (hysteroscope) is inserted inside the uterus. A saline solution is put into your uterus. This expands the uterus and allows the surgeon to see the fibroids. Tools are passed through the hysteroscope to remove the fibroid tumor in pieces.  Laparoscopic myomectomy--A few small cuts (incisions) are made in the lower abdomen. A thin, lighted tube with a tiny camera on the end (laparoscope) is inserted through one of the incisions. This gives the surgeon a good view of the area. The fibroid tumor is removed through the other incisions. The incisions are then closed with stitches (sutures) or staples.  Abdominal myomectomy--This method is used when the fibroid tumor cannot be removed with a hysteroscope or laparoscope. The surgery is performed through a larger surgical incision in the abdomen. The  fibroid tumor is removed through this incision. The incision is closed with sutures or staples. AFTER THE PROCEDURE  If you had a laparoscopic or hysteroscopic myomectomy, you may be able to go home the same day, or you may need to stay in the hospital overnight.  If you had an abdominal myomectomy, you may need to stay in the hospital for a few days.  Your  IV access tube and catheter will be removed in 1-2 days.  You may be given medicine for pain or to help you sleep.  You may be given an antibiotic medicine, if needed.   This information is not intended to replace advice given to you by your health care provider. Make sure you discuss any questions you have with your health care provider.   Document Released: 11/02/2006 Document Revised: 10/26/2012 Document Reviewed: 08/17/2012 Elsevier Interactive Patient Education 2016 Else[employer] Diagnostic Laparoscopy A diagnostic laparoscopy is a procedure to diagnose diseases in the abdomen. During the procedure, a thin, lighted, pencil-sized instrument called a laparoscope is inserted into the abdomen through an incision. The laparoscope allows your health care provider to look at the organs inside your body. LET [facility] CARE PROVIDER KNOW ABOUT:  Any allergies you have.  All medicines you are taking, including vitamins, herbs, eye drops, creams, and over-the-counter medicines.  Previous problems you or members of your family have had with the use of anesthetics.  Any blood disorders you have.  Previous surgeries you have had.  Medical conditions you have. RISKS AND COMPLICATIONS  Generally, this is a safe procedure. However, problems can occur, which may include:  Infection.  Bleeding.  Damage to other organs.  Allergic reaction to the anesthetics used during the procedure. BEFORE THE PROCEDURE  Do not eat or drink anything after midnight on the night before the procedure or as directed by your health care provider.  Ask your health care provider about:  Changing or stopping your regular medicines.  Taking medicines such as aspirin and ibuprofen. These medicines can thin your blood. Do not take these medicines before your procedure if your health care provider instructs you not to.  Plan to have someone take you home after the procedure. PROCEDURE  You may be given a  medicine to help you relax (sedative).  You will be given a medicine to make you sleep (general anesthetic).  Your abdomen will be inflated with a gas. This will make your organs easier to see.  Small incisions will be made in your abdomen.  A laparoscope and other small instruments will be inserted into the abdomen through the incisions.  A tissue sample may be removed from an organ in the abdomen for examination.  The instruments will be removed from the abdomen.  The gas will be released.  The incisions will be closed with stitches (sutures). AFTER THE PROCEDURE  Your blood pressure, heart rate, breathing rate, and blood oxygen level will be monitored often until the medicines you were given have worn off.   This information is not intended to replace advice given to you by your health care provider. Make sure you discuss any questions you have with your health care provider.   Document Released: 04/13/2000 Document Revised: 09/26/2014 Document Reviewed: 08/18/2013 Elsevier Interactive Patient Education [employer].

## 2015-01-23 ENCOUNTER — Encounter: Payer: Self-pay | Admitting: Obstetrics and Gynecology

## 2015-01-23 NOTE — H&P (Signed)
GYNECOLOGY HISTORY AND PHYSICAL   Subjective:    Patient is a 35 y.o. P15 female scheduled for laparoscopic myomectomy. Indications for procedure are large leiomyoma, desired fertility   Pertinent Gynecological History: Menses: regular every 28 days without intermenstrual spotting Contraception: none Last pap: normal Date: 07/2014  Discussed Blood/Blood Products: yes   Menstrual History: Obstetric History   G1   P0   T0   P0   A1   TAB0   SAB1   E0   M0   L0     # Outcome Date GA Lbr Len/2nd Weight Sex Delivery Anes PTL Lv  1 SAB 12/03/14 [redacted]w[redacted]d       FD  Missed abortion at 8 weeks, managed expectantly  Obstetric Comments  1st Menstrual Cycle:  13  1st Pregnancy: 0    Menarche age: 38 Patient's last menstrual period was 01/07/2015.    Past Medical History  Diagnosis Date  . Hypertension   . Polycystic ovarian syndrome   . Goiter   . Gross hematuria   . Herpes, genital   . Low grade squamous intraepithelial lesion (LGSIL)   . Polycystic ovaries   . Morbid obesity with BMI of 40.0-44.9, adult ([place])   . Vitamin D deficiency   . Dermatophytosis of foot     Family History  Problem Relation Age of Onset  . Thyroid disease Mother   . Hypertension Mother     Past Surgical History  Procedure Laterality Date  . Mouth surgery  1996    Social History   Social History  . Marital Status: Single    Spouse Name: N/A  . Number of Children: N/A  . Years of Education: N/A   Social History Main Topics  . Smoking status: Never Smoker   . Smokeless tobacco: Never Used  . Alcohol Use: 0.0 oz/week    0 Standard drinks or equivalent per week     Comment: 2/week  . Drug Use: No  . Sexual Activity:    Partners: Male   Other Topics Concern  . None   Social History Narrative   Current Outpatient Prescriptions on File Prior to Visit  Medication Sig Dispense Refill  . hydrochlorothiazide (HYDRODIURIL) 12.5 MG tablet Take 1 tablet (12.5 mg total) by mouth daily. 30  tablet 6  . valACYclovir (VALTREX) 500 MG tablet TAKE 1 TABLET BY MOUTH DAILY FOR PREVENTION AND THREE TIMES DAILY FOR OUTBREAKS  12   No current facility-administered medications on file prior to visit.    Allergies  Allergen Reactions  . Hydrocodone Swelling    Review of Systems Constitutional: No recent fever/chills/sweats Respiratory: No recent cough/bronchitis Cardiovascular: No chest pain Gastrointestinal: No recent nausea/vomiting/diarrhea Genitourinary: No UTI symptoms, abnormal or heavy periods Hematologic/lymphatic:No history of coagulopathy or recent blood thinner use    Objective:    BP 160/90 mmHg  Pulse 87  Wt 290 lb 14.4 oz (131.951 kg)  LMP 01/07/2015  Breastfeeding? Unknown  General:   alert and no distress, obese  Skin:   normal  HEENT:  Normal  Neck:  Supple without Adenopathy or Thyromegaly  Lungs:   Heart:              Breasts:   Abdomen:  Pelvis:  M/S   Extremeties:  Neuro:    clear to auscultation bilaterally   Normal without murmur   Not Examined   soft, non-tender; bowel sounds normal; no masses,  no organomegaly   Exam deferred to OR  No CVAT  Warm/Dry   Normal          Imaging (12/07/2014 Pelvic Ultrasound):  Findings:  The uterus measures 8.8 x 3.2 x 3.6 Echo texture is heterogenous with evidence of a large focal mass located on the posterior portion of the uterus extending from the fundus to the LUS most likely representing a large fibroid, pedunculated appearing. Fibroid 1: 8.4 x 6.0 x 8.7 cm ( this differs in size from prior scan at which time the fibroid was measured at 7.7 x 8.7 x 7.0 cm) which may reflect differences in measuring by different sonographers.  The Endometrium measures 4.0 mm, and appears WNL, with no definite RPOC seen.  Right was not visualized, right adnexa appears WNL. Left Ovary measures 5.4 x 1.8 x 3.7 cm, and has a simple appearing 2.7 cm cyst. This is best seen transabdominal due to the size of the  fibroid. The left adnexa was surveyed and demonstrates no adnexal masses. There is no free fluid in the cul de sac.  Impression: 1. No obvious RPOC is seen s/p miscarriage. 2. Large posterior pedunculated fibroid.  Assessment:    Leiyomyoma (pedunculated)  H/o miscarriage  Elevated BP (uncontrolled BP) Obesity, morbid   Plan:  Discussed with patient that typically pedunculated fibroids don't typically cause miscarriages, however the patient does have other risk factors, including obesity and advanced maternal age, uncontrolled HTN. Also discussed the chances of miscarriage in the general population. Patient notes understanding however still desires surgery for removal.  Counseling: Procedure, risks, reasons, benefits and complications (including injury to bowel, bladder, major blood vessel, ureter, bleeding, possibility of transfusion, infection, or fistula formation) reviewed in detail. Consent signed. Preop testing ordered. Instructions reviewed, including NPO after midnight. Elevated BP, patient with cHTN, currently uncontrolled. Needs to f/u with PCP regarding management.    Rubie Maid, MD [facility]

## 2015-01-23 NOTE — Progress Notes (Signed)
   GYNECOLOGY PROGRESS NOTE  Subjective:    Patient ID: Michelle Ware, female    DOB: 07/10/1980, 35 y.o.   MRN: [id number]  HPI  Patient is a 35 y.o. G1P0 female who presents for discussion of management options for fibroid uterus. She is 2 months s/p a missed AB.  Is concerned that her large fibroid may affect future pregnancies. Desires to be able to attempt conception again soon.    The following portions of the patient's history were reviewed and updated as appropriate: allergies, current medications, past family history, past medical history, past social history, past surgical history and problem list.  Review of Systems Pertinent items noted in HPI and remainder of comprehensive ROS otherwise negative.   Objective:   Blood pressure 160/90, pulse 87, weight 290 lb 14.4 oz (131.951 kg), last menstrual period 01/07/2015, unknown if currently breastfeeding. Body mass index is 41.74 kg/(m^2).  General appearance: alert and no distress, obese Exam deferred.  Assessment:   Leiyomyoma (pedunculated) H/o miscarriage Elevated BP (uncontrolled BP) Obesity, morbid  Plan:   - Discussed with patient that typically pedunculated fibroids don't typically cause miscarriages, however the patient does have other risk factors, including obesity, uncontrolled HTN, and advanced maternal age. Also discussed the chances of miscarriage in the general population.  Patient notes understanding however still desires surgery for removal.   - Patient desires surgical management with myomectomy.  Discussed laparoscopic vs abdominal.  Based on location and type of fibroid, could be performed laparoscopically.  Advised that if on diagnostic evaluation more fibroids were noted in the uterus, would like change to an abdominal surgery.  The risks of surgery were discussed in detail with the patient including but not limited to: bleeding which may require transfusion or reoperation; infection which may require prolonged  hospitalization or re-hospitalization and antibiotic therapy; injury to bowel, bladder, ureters and major vessels or other surrounding organs; need for additional procedures including laparotomy; thromboembolic phenomenon, incisional problems and other postoperative or anesthesia complications.  The postoperative expectations were also discussed in detail. The patient also understands the alternative treatment options which were discussed in full. She was made aware that if there is a breech of the uterine cavity, she would require C-sections as mode of delivery. All questions were answered.  She was told that she will be contacted by our surgical scheduler regarding the time and date of her surgery; routine preoperative instructions of having nothing to eat or drink after midnight on the day prior to surgery and also coming to the hospital 1.5 hours prior to her time of surgery were also emphasized.  She was told she may be called for a preoperative appointment about a week prior to surgery and will be given further preoperative instructions at that visit. Printed patient education handouts about the procedure were given to the patient to review at home.  Patient's surgery scheduled for 02/10/2014 for laparoscopic myomectomy.  - Elevated BP, patient with cHTN, currently uncontrolled.  Needs to f/u with PCP regarding management.      A total of 30 minutes were spent face-to-face with the patient during this encounter and over half of that time involved counseling and coordination of care. Rubie Maid, MD [facility]

## 2015-02-04 ENCOUNTER — Inpatient Hospital Stay: Admission: RE | Admit: 2015-02-04 | Payer: Managed Care, Other (non HMO) | Source: Ambulatory Visit

## 2015-02-04 ENCOUNTER — Encounter: Payer: Self-pay | Admitting: *Deleted

## 2015-02-06 ENCOUNTER — Encounter: Payer: Managed Care, Other (non HMO) | Admitting: Obstetrics and Gynecology

## 2015-03-01 NOTE — Patient Instructions (Signed)
  Your procedure is scheduled on:03-11-15 (MONDAY) Report to [place] To find out your arrival time please call [phone] between 1PM - 3PM on 03-08-15 (FRIDAY)  Remember: Instructions that are not followed completely may result in serious medical risk, up to and including death, or upon the discretion of your surgeon and anesthesiologist your surgery may need to be rescheduled.    _X___ 1. Do not eat food or drink liquids after midnight. No gum chewing or hard candies.     _X___ 2. No Alcohol for 24 hours before or after surgery.   ____ 3. Bring all medications with you on the day of surgery if instructed.    _X___ 4. Notify your doctor if there is any change in your medical condition     (cold, fever, infections).     Do not wear jewelry, make-up, hairpins, clips or nail polish.  Do not wear lotions, powders, or perfumes. You may wear deodorant.  Do not shave 48 hours prior to surgery. Men may shave face and neck.  Do not bring valuables to the hospital.    [facility] is not responsible for any belongings or valuables.               Contacts, dentures or bridgework may not be worn into surgery.  Leave your suitcase in the car. After surgery it may be brought to your room.  For patients admitted to the hospital, discharge time is determined by your  treatment team.   Patients discharged the day of surgery will not be allowed to drive home.   Please read over the following fact sheets that you were given:     ____ Take these medicines the morning of surgery with A SIP OF WATER:    1. NONE  2.   3.   4.  5.  6.  ____ Fleet Enema (as directed)   _X___ Use CHG Soap as directed  ____ Use inhalers on the day of surgery  ____ Stop metformin 2 days prior to surgery    ____ Take 1/2 of usual insulin dose the night before surgery and none on the morning of surgery.   ____ Stop Coumadin/Plavix/aspirin-N/A  ____ Stop Anti-inflammatories-NO  NSAIDS OR ASPIRIN PRODUCTS-TYLENOL OK TO TAKE   ____ Stop supplements until after surgery.    ____ Bring C-Pap to the hospital.

## 2015-03-04 ENCOUNTER — Other Ambulatory Visit: Payer: Managed Care, Other (non HMO)

## 2015-03-05 ENCOUNTER — Ambulatory Visit (INDEPENDENT_AMBULATORY_CARE_PROVIDER_SITE_OTHER): Payer: Managed Care, Other (non HMO) | Admitting: Obstetrics and Gynecology

## 2015-03-05 ENCOUNTER — Encounter: Payer: Self-pay | Admitting: Obstetrics and Gynecology

## 2015-03-05 ENCOUNTER — Encounter
Admission: RE | Admit: 2015-03-05 | Discharge: 2015-03-05 | Disposition: A | Discharge status: Home / Self Care | Payer: Managed Care, Other (non HMO) | Source: Ambulatory Visit | Attending: Obstetrics and Gynecology | Admitting: Obstetrics and Gynecology

## 2015-03-05 VITALS — BP 124/75 | HR 84 | Ht 70.0 in | Wt 294.2 lb

## 2015-03-05 DIAGNOSIS — E669 Obesity, unspecified: Secondary | ICD-10-CM

## 2015-03-05 DIAGNOSIS — D252 Subserosal leiomyoma of uterus: Secondary | ICD-10-CM

## 2015-03-05 DIAGNOSIS — Z01812 Encounter for preprocedural laboratory examination: Secondary | ICD-10-CM | POA: Diagnosis not present

## 2015-03-05 DIAGNOSIS — Z01818 Encounter for other preprocedural examination: Secondary | ICD-10-CM

## 2015-03-05 DIAGNOSIS — Z01810 Encounter for preprocedural cardiovascular examination: Secondary | ICD-10-CM | POA: Insufficient documentation

## 2015-03-05 LAB — CBC
HEMATOCRIT: 35.2 % (ref 35.0–47.0)
HEMOGLOBIN: 11.3 g/dL — AB (ref 12.0–16.0)
MCH: 24.7 pg — AB (ref 26.0–34.0)
MCHC: 32.2 g/dL (ref 32.0–36.0)
MCV: 76.7 fL — AB (ref 80.0–100.0)
Platelets: 266 10*3/uL (ref 150–440)
RBC: 4.59 MIL/uL (ref 3.80–5.20)
RDW: 14.8 % — AB (ref 11.5–14.5)
WBC: 6.3 10*3/uL (ref 3.6–11.0)

## 2015-03-05 LAB — POTASSIUM: POTASSIUM: 3.8 mmol/L (ref 3.5–5.1)

## 2015-03-05 NOTE — Progress Notes (Signed)
GYNECOLOGY PROGRESS NOTE  Subjective:    Patient ID: Michelle Ware, female    DOB: 1980/06/11, 35 y.o.   MRN: [id number]  HPI  Patient is a 35 y.o. G1P0 female who presents for pre-operative exam for surgical  management of fibroid uterus. She is 3 months s/p a missed AB.  Is concerned that her large fibroid may affect future pregnancies. Desires to be able to attempt conception again soon.  Rescheduled surgery from 02/11/2015.   The following portions of the patient's history were reviewed and updated as appropriate: allergies, current medications, past family history, past medical history, past social history, past surgical history and problem list.  Review of Systems Pertinent items noted in HPI and remainder of comprehensive ROS otherwise negative.   Objective:   Blood pressure 124/75, pulse 84, height 5\' 10"  (1.778 m), weight 294 lb 3.2 oz (133.448 kg), last menstrual period 02/03/2015, not currently breastfeeding. Body mass index is 42.21 kg/(m^2).  Patient's last menstrual period was 02/03/2015 (exact date).   General:  alert and no distress, obese  Skin:  normal  HEENT: Normal  Neck: Supple without Adenopathy or Thyromegaly  Lungs:   Heart:    Breasts:   Abdomen:  Pelvis:  M/S   Extremeties:  Neuro:   clear to auscultation bilaterally  Normal without murmur  Not Examined  soft, non-tender; bowel sounds normal; no masses, no organomegaly  Exam deferred to OR  No CVAT  Warm/Dry  Normal      Imaging (12/07/2014 Pelvic Ultrasound):  Findings:  The uterus measures 8.8 x 3.2 x 3.6 Echo texture is heterogenous with evidence of a large focal mass located on the posterior portion of the uterus extending from the fundus to the LUS most likely representing a large fibroid, pedunculated appearing. Fibroid 1: 8.4 x 6.0 x 8.7 cm ( this differs in size from prior scan at which time the fibroid was measured at 7.7 x 8.7 x 7.0 cm) which  may reflect differences in measuring by different sonographers.  The Endometrium measures 4.0 mm, and appears WNL, with no definite RPOC seen.  Right was not visualized, right adnexa appears WNL. Left Ovary measures 5.4 x 1.8 x 3.7 cm, and has a simple appearing 2.7 cm cyst. This is best seen transabdominal due to the size of the fibroid. The left adnexa was surveyed and demonstrates no adnexal masses. There is no free fluid in the cul de sac.  Impression: 1. No obvious RPOC is seen s/p miscarriage. 2. Large posterior pedunculated fibroid.   Assessment:   Leiyomyoma (pedunculated) H/o miscarriage Elevated BP (uncontrolled BP) Obesity, morbid  Plan:   - Discussed with patient that typically pedunculated fibroids don't typically cause miscarriages, however the patient does have other risk factors, including obesity, uncontrolled HTN, and advanced maternal age. Also discussed the chances of miscarriage in the general population.  Patient notes understanding however still desires surgery for removal.   - Patient desires surgical management with myomectomy.  Discussed laparoscopic vs abdominal.  Based on location and type of fibroid, could be performed laparoscopically.  Advised that if on diagnostic evaluation more fibroids were noted in the uterus, would like change to an abdominal surgery.  The risks of surgery were discussed in detail with the patient including but not limited to: bleeding which may require transfusion or reoperation; infection which may require prolonged hospitalization or re-hospitalization and antibiotic therapy; injury to bowel, bladder, ureters and major vessels or other surrounding organs; need for additional procedures including laparotomy;  thromboembolic phenomenon, incisional problems and other postoperative or anesthesia complications.  The postoperative expectations were also discussed in detail. The patient also understands the alternative treatment options which  were discussed in full. She was made aware that if there is a breech of the uterine cavity, she would require C-sections as mode of delivery. All questions were answered.  She was told that she will be contacted by our surgical scheduler regarding the time and date of her surgery; routine preoperative instructions of having nothing to eat or drink after midnight on the day prior to surgery and also coming to the hospital 1.5 hours prior to her time of surgery were also emphasized.  She was told she may be called for a preoperative appointment about a week prior to surgery and will be given further preoperative instructions at that visit. Printed patient education handouts about the procedure were given to the patient to review at home.  Patient's surgery scheduled for 03/11/15 for laparoscopic myomectomy.  - Elevated BP, patient with cHTN, currently uncontrolled.  Needs to f/u with PCP regarding management.     A total of 15 minutes were spent face-to-face with the patient during this encounter and over half of that time involved counseling and coordination of care. Rubie Maid, MD [facility]

## 2015-03-05 NOTE — H&P (Signed)
GYNECOLOGY HISTORY AND PHYSICAL   Subjective:    Patient is a 35 y.o. P25 female scheduled for laparoscopic myomectomy. Indications for procedure are large leiomyoma, desired fertility   Pertinent Gynecological History: Menses: regular every 28 days without intermenstrual spotting Contraception: none Last pap: normal Date: 07/2014  Discussed Blood/Blood Products: yes   Menstrual History: Obstetric History   G1   P0   T0   P0   A1   TAB0   SAB1   E0   M0   L0     # Outcome Date GA Lbr Len/2nd Weight Sex Delivery Anes PTL Lv  1 SAB 12/03/14 [redacted]w[redacted]d       FD  Missed abortion at 8 weeks, managed expectantly  Obstetric Comments  1st Menstrual Cycle:  11  1st Pregnancy: 0   Menarche age: 33 Patient's last menstrual period was 02/03/2015 (exact date).    Past Medical History  Diagnosis Date  . Hypertension   . Polycystic ovarian syndrome   . Goiter   . Gross hematuria   . Herpes, genital   . Low grade squamous intraepithelial lesion (LGSIL)   . Polycystic ovaries   . Morbid obesity with BMI of 40.0-44.9, adult ([place])   . Vitamin D deficiency   . Dermatophytosis of foot     Family History  Problem Relation Age of Onset  . Thyroid disease Mother   . Hypertension Mother     Past Surgical History  Procedure Laterality Date  . Mouth surgery  1996   Social History   Social History  . Marital Status: Single    Spouse Name: N/A  . Number of Children: N/A  . Years of Education: N/A   Occupational History  . Not on file.   Social History Main Topics  . Smoking status: Never Smoker   . Smokeless tobacco: Never Used  . Alcohol Use: 0.0 oz/week    0 Standard drinks or equivalent per week     Comment: 2/week  . Drug Use: No  . Sexual Activity:    Partners: Male   Other Topics Concern  . Not on file   Social History Narrative    Current Outpatient Prescriptions on File Prior to Visit  Medication Sig Dispense Refill  . hydrochlorothiazide (HYDRODIURIL) 12.5  MG tablet Take 1 tablet (12.5 mg total) by mouth daily. 30 tablet 6  . valACYclovir (VALTREX) 500 MG tablet TAKE 1 TABLET BY MOUTH DAILY FOR PREVENTION AND THREE TIMES DAILY FOR OUTBREAKS  12   No current facility-administered medications on file prior to visit.    Allergies  Allergen Reactions  . Hydrocodone Itching    Review of Systems Constitutional: No recent fever/chills/sweats Respiratory: No recent cough/bronchitis Cardiovascular: No chest pain Gastrointestinal: No recent nausea/vomiting/diarrhea Genitourinary: No UTI symptoms, abnormal or heavy periods Hematologic/lymphatic:No history of coagulopathy or recent blood thinner use    Objective:    BP 124/75 mmHg  Pulse 84  Ht 5\' 10"  (1.778 m)  Wt 294 lb 3.2 oz (133.448 kg)  BMI 42.21 kg/m2  LMP 02/03/2015 (Exact Date)  Breastfeeding? No  General:   alert and no distress, obese  Skin:   normal  HEENT:  Normal  Neck:  Supple without Adenopathy or Thyromegaly  Lungs:   Heart:              Breasts:   Abdomen:  Pelvis:  M/S   Extremeties:  Neuro:    clear to auscultation bilaterally   Normal without murmur  Not Examined   soft, non-tender; bowel sounds normal; no masses,  no organomegaly   Exam deferred to OR  No CVAT  Warm/Dry   Normal          Imaging (12/07/2014 Pelvic Ultrasound):  Findings:  The uterus measures 8.8 x 3.2 x 3.6 Echo texture is heterogenous with evidence of a large focal mass located on the posterior portion of the uterus extending from the fundus to the LUS most likely representing a large fibroid, pedunculated appearing. Fibroid 1: 8.4 x 6.0 x 8.7 cm ( this differs in size from prior scan at which time the fibroid was measured at 7.7 x 8.7 x 7.0 cm) which may reflect differences in measuring by different sonographers.  The Endometrium measures 4.0 mm, and appears WNL, with no definite RPOC seen.  Right was not visualized, right adnexa appears WNL. Left Ovary measures 5.4 x 1.8 x  3.7 cm, and has a simple appearing 2.7 cm cyst. This is best seen transabdominal due to the size of the fibroid. The left adnexa was surveyed and demonstrates no adnexal masses. There is no free fluid in the cul de sac.  Impression: 1. No obvious RPOC is seen s/p miscarriage. 2. Large posterior pedunculated fibroid.  Assessment:    Leiyomyoma (pedunculated)  H/o miscarriage  Elevated BP (uncontrolled BP) Obesity, morbid   Plan:  Discussed with patient that typically pedunculated fibroids don't typically cause miscarriages, however the patient does have other risk factors, including obesity and advanced maternal age, uncontrolled HTN. Also discussed the chances of miscarriage in the general population. Patient notes understanding however still desires surgery for removal.  Counseling: Procedure, risks, reasons, benefits and complications (including injury to bowel, bladder, major blood vessel, ureter, bleeding, possibility of transfusion, infection, or fistula formation) reviewed in detail. Consent signed.  The patient also understands the alternative treatment options which were discussed in full. She was made aware that if there is a breech of the uterine cavity, she would require C-sections as mode of delivery. All questions were answered. She was told that she will be contacted by our surgical scheduler regarding the time and date of her surgery; routine preoperative instructions of having nothing to eat or drink after midnight on the day prior to surgery and also coming to the hospital 1.5 hours prior to her time of surgery were also emphasized. Preop testing ordered.  Surgery scheduled for 03/11/2015.  Instructions reviewed, including NPO after midnight. Elevated BP, patient with cHTN, currently uncontrolled. Needs to f/u with PCP regarding management.    Rubie Maid, MD [facility]

## 2015-03-06 ENCOUNTER — Other Ambulatory Visit: Payer: Managed Care, Other (non HMO)

## 2015-03-11 ENCOUNTER — Encounter
Admission: AD | Disposition: A | Discharge status: Home / Self Care | Payer: Self-pay | Source: Ambulatory Visit | Attending: Obstetrics and Gynecology

## 2015-03-11 ENCOUNTER — Inpatient Hospital Stay
Admission: AD | Admit: 2015-03-11 | Discharge: 2015-03-14 | DRG: 743 | Disposition: A | Discharge status: Home / Self Care | Payer: Managed Care, Other (non HMO) | Source: Ambulatory Visit | Attending: Obstetrics and Gynecology | Admitting: Obstetrics and Gynecology

## 2015-03-11 ENCOUNTER — Ambulatory Visit: Payer: Managed Care, Other (non HMO) | Admitting: Anesthesiology

## 2015-03-11 ENCOUNTER — Encounter: Payer: Self-pay | Admitting: *Deleted

## 2015-03-11 DIAGNOSIS — E669 Obesity, unspecified: Secondary | ICD-10-CM | POA: Diagnosis not present

## 2015-03-11 DIAGNOSIS — Z9889 Other specified postprocedural states: Secondary | ICD-10-CM

## 2015-03-11 DIAGNOSIS — N920 Excessive and frequent menstruation with regular cycle: Secondary | ICD-10-CM | POA: Diagnosis not present

## 2015-03-11 DIAGNOSIS — D259 Leiomyoma of uterus, unspecified: Secondary | ICD-10-CM | POA: Diagnosis not present

## 2015-03-11 DIAGNOSIS — F17290 Nicotine dependence, other tobacco product, uncomplicated: Secondary | ICD-10-CM | POA: Diagnosis present

## 2015-03-11 DIAGNOSIS — D649 Anemia, unspecified: Secondary | ICD-10-CM | POA: Diagnosis present

## 2015-03-11 DIAGNOSIS — I10 Essential (primary) hypertension: Secondary | ICD-10-CM | POA: Diagnosis present

## 2015-03-11 DIAGNOSIS — D250 Submucous leiomyoma of uterus: Principal | ICD-10-CM | POA: Diagnosis present

## 2015-03-11 HISTORY — PX: LAPAROTOMY: SHX154

## 2015-03-11 HISTORY — PX: LAPAROSCOPIC GELPORT ASSISTED MYOMECTOMY: SHX6549

## 2015-03-11 LAB — POCT PREGNANCY, URINE: PREG TEST UR: NEGATIVE

## 2015-03-11 SURGERY — LAPAROSCOPIC GELPORT ASSISTED MYOMECTOMY
Anesthesia: General

## 2015-03-11 MED ORDER — FENTANYL CITRATE (PF) 100 MCG/2ML IJ SOLN
25.0000 ug | INTRAMUSCULAR | Status: DC | PRN
  Administered 2015-03-11 (×5): 25 ug via INTRAVENOUS

## 2015-03-11 MED ORDER — ACETAMINOPHEN 10 MG/ML IV SOLN
INTRAVENOUS | Status: DC | PRN
  Administered 2015-03-11: 1000 mg via INTRAVENOUS

## 2015-03-11 MED ORDER — ZOLPIDEM TARTRATE 5 MG PO TABS
5.0000 mg | ORAL_TABLET | Freq: Every evening | ORAL | Status: DC | PRN
  Filled 2015-03-11: qty 1

## 2015-03-11 MED ORDER — IBUPROFEN 600 MG PO TABS
600.0000 mg | ORAL_TABLET | Freq: Four times a day (QID) | ORAL | Status: DC | PRN
  Administered 2015-03-12 – 2015-03-13 (×3): 600 mg via ORAL
  Filled 2015-03-11 (×3): qty 1

## 2015-03-11 MED ORDER — FENTANYL CITRATE (PF) 100 MCG/2ML IJ SOLN
INTRAMUSCULAR | Status: DC | PRN
  Administered 2015-03-11: 100 ug via INTRAVENOUS
  Administered 2015-03-11: 25 ug via INTRAVENOUS
  Administered 2015-03-11 (×2): 50 ug via INTRAVENOUS
  Administered 2015-03-11: 25 ug via INTRAVENOUS

## 2015-03-11 MED ORDER — LIDOCAINE HCL (CARDIAC) 20 MG/ML IV SOLN
INTRAVENOUS | Status: DC | PRN
  Administered 2015-03-11 (×3): 25 mg via INTRAVENOUS
  Administered 2015-03-11: 100 mg via INTRAVENOUS
  Administered 2015-03-11 (×5): 25 mg via INTRAVENOUS

## 2015-03-11 MED ORDER — PROPOFOL 10 MG/ML IV BOLUS
INTRAVENOUS | Status: DC | PRN
  Administered 2015-03-11: 200 mg via INTRAVENOUS

## 2015-03-11 MED ORDER — ACETAMINOPHEN 10 MG/ML IV SOLN
INTRAVENOUS | Status: AC
  Filled 2015-03-11: qty 100

## 2015-03-11 MED ORDER — FENTANYL CITRATE (PF) 100 MCG/2ML IJ SOLN
INTRAMUSCULAR | Status: AC
  Administered 2015-03-11: 25 ug via INTRAVENOUS
  Filled 2015-03-11: qty 2

## 2015-03-11 MED ORDER — ONDANSETRON HCL 4 MG/2ML IJ SOLN
INTRAMUSCULAR | Status: DC | PRN
  Administered 2015-03-11: 4 mg via INTRAVENOUS

## 2015-03-11 MED ORDER — FAMOTIDINE IN NACL 20-0.9 MG/50ML-% IV SOLN
20.0000 mg | Freq: Once | INTRAVENOUS | Status: DC
  Filled 2015-03-11: qty 50

## 2015-03-11 MED ORDER — FAMOTIDINE 20 MG PO TABS
20.0000 mg | ORAL_TABLET | Freq: Once | ORAL | Status: AC
  Administered 2015-03-11: 20 mg via ORAL

## 2015-03-11 MED ORDER — NEOSTIGMINE METHYLSULFATE 10 MG/10ML IV SOLN
INTRAVENOUS | Status: DC | PRN
  Administered 2015-03-11: 3 mg via INTRAVENOUS

## 2015-03-11 MED ORDER — DEXAMETHASONE SODIUM PHOSPHATE 10 MG/ML IJ SOLN
INTRAMUSCULAR | Status: DC | PRN
  Administered 2015-03-11: 10 mg via INTRAVENOUS

## 2015-03-11 MED ORDER — ROCURONIUM BROMIDE 100 MG/10ML IV SOLN
INTRAVENOUS | Status: DC | PRN
  Administered 2015-03-11: 10 mg via INTRAVENOUS
  Administered 2015-03-11: 40 mg via INTRAVENOUS

## 2015-03-11 MED ORDER — ONDANSETRON HCL 4 MG/2ML IJ SOLN: 4.0000 mg | Freq: Four times a day (QID) | INTRAMUSCULAR | Status: DC | PRN

## 2015-03-11 MED ORDER — KETAMINE HCL 10 MG/ML IJ SOLN
INTRAMUSCULAR | Status: DC | PRN
  Administered 2015-03-11: 40 mg via INTRAVENOUS

## 2015-03-11 MED ORDER — SENNOSIDES-DOCUSATE SODIUM 8.6-50 MG PO TABS: 1.0000 | ORAL_TABLET | Freq: Every evening | ORAL | Status: DC | PRN

## 2015-03-11 MED ORDER — GLYCOPYRROLATE 0.2 MG/ML IJ SOLN
INTRAMUSCULAR | Status: DC | PRN
  Administered 2015-03-11: 0.2 mg via INTRAVENOUS
  Administered 2015-03-11: 0.4 mg via INTRAVENOUS

## 2015-03-11 MED ORDER — LIDOCAINE 5 % EX PTCH
MEDICATED_PATCH | CUTANEOUS | Status: DC | PRN
  Administered 2015-03-11: 1 via TRANSDERMAL

## 2015-03-11 MED ORDER — DEXTROSE 5 % IV SOLN
3.0000 g | INTRAVENOUS | Status: DC | PRN
  Administered 2015-03-11: 3 g via INTRAVENOUS

## 2015-03-11 MED ORDER — FAMOTIDINE 20 MG PO TABS
ORAL_TABLET | ORAL | Status: AC
  Administered 2015-03-11: 20 mg via ORAL
  Filled 2015-03-11: qty 1

## 2015-03-11 MED ORDER — BISACODYL 10 MG RE SUPP: 10.0000 mg | Freq: Every day | RECTAL | Status: DC | PRN

## 2015-03-11 MED ORDER — VASOPRESSIN 20 UNIT/ML IV SOLN
INTRAVENOUS | Status: AC
  Filled 2015-03-11: qty 1

## 2015-03-11 MED ORDER — VASOPRESSIN 20 UNIT/ML IJ SOLN
INTRAMUSCULAR | Status: DC | PRN
  Administered 2015-03-11: 20 [IU]

## 2015-03-11 MED ORDER — DOCUSATE SODIUM 100 MG PO CAPS
100.0000 mg | ORAL_CAPSULE | Freq: Two times a day (BID) | ORAL | Status: DC
  Administered 2015-03-11 – 2015-03-14 (×6): 100 mg via ORAL
  Filled 2015-03-11 (×7): qty 1

## 2015-03-11 MED ORDER — MENTHOL 3 MG MT LOZG
1.0000 | LOZENGE | OROMUCOSAL | Status: DC | PRN
  Filled 2015-03-11: qty 9

## 2015-03-11 MED ORDER — LACTATED RINGERS IV SOLN
INTRAVENOUS | Status: DC
  Administered 2015-03-11 (×3): via INTRAVENOUS

## 2015-03-11 MED ORDER — HYDROCHLOROTHIAZIDE 25 MG PO TABS
12.5000 mg | ORAL_TABLET | Freq: Every day | ORAL | Status: DC
  Administered 2015-03-12: 12.5 mg via ORAL
  Filled 2015-03-11 (×4): qty 0.5

## 2015-03-11 MED ORDER — ONDANSETRON HCL 4 MG/2ML IJ SOLN
4.0000 mg | Freq: Once | INTRAMUSCULAR | Status: AC | PRN
  Administered 2015-03-11: 4 mg via INTRAVENOUS

## 2015-03-11 MED ORDER — SIMETHICONE 80 MG PO CHEW
80.0000 mg | CHEWABLE_TABLET | Freq: Four times a day (QID) | ORAL | Status: DC | PRN
  Administered 2015-03-12 – 2015-03-14 (×5): 80 mg via ORAL
  Filled 2015-03-11 (×5): qty 1

## 2015-03-11 MED ORDER — OXYCODONE-ACETAMINOPHEN 5-325 MG PO TABS
1.0000 | ORAL_TABLET | ORAL | Status: DC | PRN
  Administered 2015-03-11 – 2015-03-14 (×8): 2 via ORAL
  Filled 2015-03-11 (×9): qty 2

## 2015-03-11 MED ORDER — PANTOPRAZOLE SODIUM 40 MG PO TBEC
40.0000 mg | DELAYED_RELEASE_TABLET | Freq: Every day | ORAL | Status: DC
  Administered 2015-03-11 – 2015-03-14 (×4): 40 mg via ORAL
  Filled 2015-03-11 (×4): qty 1

## 2015-03-11 MED ORDER — LACTATED RINGERS IV SOLN: INTRAVENOUS | Status: DC

## 2015-03-11 MED ORDER — LIDOCAINE 5 % EX PTCH
MEDICATED_PATCH | CUTANEOUS | Status: AC
  Filled 2015-03-11: qty 1

## 2015-03-11 MED ORDER — HYDROMORPHONE HCL 1 MG/ML IJ SOLN: 1.0000 mg | INTRAMUSCULAR | Status: DC | PRN

## 2015-03-11 MED ORDER — BUPIVACAINE HCL (PF) 0.5 % IJ SOLN
INTRAMUSCULAR | Status: AC
  Filled 2015-03-11: qty 30

## 2015-03-11 MED ORDER — DEXMEDETOMIDINE HCL IN NACL 400 MCG/100ML IV SOLN
INTRAVENOUS | Status: DC | PRN
  Administered 2015-03-11: 20 ug via INTRAVENOUS

## 2015-03-11 MED ORDER — SODIUM CHLORIDE 0.9 % IJ SOLN
INTRAMUSCULAR | Status: AC
  Filled 2015-03-11: qty 50

## 2015-03-11 MED ORDER — ALUM & MAG HYDROXIDE-SIMETH 200-200-20 MG/5ML PO SUSP
30.0000 mL | ORAL | Status: DC | PRN
  Administered 2015-03-13: 30 mL via ORAL
  Filled 2015-03-11 (×2): qty 30

## 2015-03-11 MED ORDER — HYDROMORPHONE HCL 1 MG/ML IJ SOLN
INTRAMUSCULAR | Status: AC
  Filled 2015-03-11: qty 1

## 2015-03-11 MED ORDER — LACTATED RINGERS IV SOLN
INTRAVENOUS | Status: DC
  Administered 2015-03-11 – 2015-03-12 (×5): via INTRAVENOUS

## 2015-03-11 MED ORDER — BUPIVACAINE HCL (PF) 0.5 % IJ SOLN
INTRAMUSCULAR | Status: DC | PRN
  Administered 2015-03-11: 8 mL

## 2015-03-11 MED ORDER — HYDROMORPHONE HCL 1 MG/ML IJ SOLN
0.2000 mg | INTRAMUSCULAR | Status: DC | PRN
  Administered 2015-03-11 (×4): 0.6 mg via INTRAVENOUS
  Administered 2015-03-12 (×3): 0.2 mg via INTRAVENOUS
  Filled 2015-03-11 (×7): qty 1

## 2015-03-11 MED ORDER — HYDROMORPHONE HCL 1 MG/ML IJ SOLN
INTRAMUSCULAR | Status: DC | PRN
  Administered 2015-03-11 (×2): 0.5 mg via INTRAVENOUS

## 2015-03-11 MED ORDER — ONDANSETRON HCL 4 MG PO TABS: 4.0000 mg | ORAL_TABLET | Freq: Four times a day (QID) | ORAL | Status: DC | PRN

## 2015-03-11 MED ORDER — VALACYCLOVIR HCL 500 MG PO TABS
500.0000 mg | ORAL_TABLET | Freq: Two times a day (BID) | ORAL | Status: DC
  Administered 2015-03-11 – 2015-03-14 (×5): 500 mg via ORAL
  Filled 2015-03-11 (×6): qty 1

## 2015-03-11 MED ORDER — ONDANSETRON HCL 4 MG/2ML IJ SOLN
INTRAMUSCULAR | Status: AC
  Administered 2015-03-11: 4 mg via INTRAVENOUS
  Filled 2015-03-11: qty 2

## 2015-03-11 SURGICAL SUPPLY — 57 items
BAG COUNTER SPONGE EZ (MISCELLANEOUS) IMPLANT
BAG SPNG 4X4 CLR HAZ (MISCELLANEOUS)
BAG URO DRAIN 2000ML W/SPOUT (MISCELLANEOUS) ×3 IMPLANT
BARRIER ADHS 3X4 INTERCEED (GAUZE/BANDAGES/DRESSINGS) ×2 IMPLANT
BLADE SURG SZ11 CARB STEEL (BLADE) ×3 IMPLANT
BRR ADH 4X3 ABS CNTRL BYND (GAUZE/BANDAGES/DRESSINGS) ×1
CANISTER SUCT 1200ML W/VALVE (MISCELLANEOUS) ×3 IMPLANT
CATH FOLEY 2WAY  5CC 16FR (CATHETERS) ×2
CATH FOLEY 2WAY 5CC 16FR (CATHETERS) ×1
CATH TRAY 16F METER LATEX (MISCELLANEOUS) ×1 IMPLANT
CATH URTH 16FR FL 2W BLN LF (CATHETERS) ×1 IMPLANT
CHLORAPREP W/TINT 26ML (MISCELLANEOUS) ×5 IMPLANT
CLOSURE WOUND 1/2 X4 (GAUZE/BANDAGES/DRESSINGS)
CORD MONOPOLAR M/FML 12FT (MISCELLANEOUS) ×1 IMPLANT
COUNTER SPONGE BAG EZ (MISCELLANEOUS)
DRSG OPSITE POSTOP 3X4 (GAUZE/BANDAGES/DRESSINGS) ×3 IMPLANT
DRSG TELFA 3X8 NADH (GAUZE/BANDAGES/DRESSINGS) ×3 IMPLANT
ELECT REM PT RETURN 9FT ADLT (ELECTROSURGICAL) ×3
ELECTRODE REM PT RTRN 9FT ADLT (ELECTROSURGICAL) ×1 IMPLANT
GAUZE SPONGE 4X4 12PLY STRL (GAUZE/BANDAGES/DRESSINGS) ×3 IMPLANT
GLOVE BIO SURGEON STRL SZ 6 (GLOVE) ×11 IMPLANT
GLOVE BIOGEL PI IND STRL 6.5 (GLOVE) ×1 IMPLANT
GLOVE BIOGEL PI INDICATOR 6.5 (GLOVE) ×10
GOWN STRL REUS W/ TWL LRG LVL3 (GOWN DISPOSABLE) ×2 IMPLANT
GOWN STRL REUS W/TWL LRG LVL3 (GOWN DISPOSABLE) ×6
IRRIGATION STRYKERFLOW (MISCELLANEOUS) ×1 IMPLANT
IRRIGATOR STRYKERFLOW (MISCELLANEOUS)
IV LACTATED RINGERS 1000ML (IV SOLUTION) ×1 IMPLANT
KIT RM TURNOVER CYSTO AR (KITS) ×3 IMPLANT
LABEL OR SOLS (LABEL) ×3 IMPLANT
MANIPULATOR VCARE LG CRV RETR (MISCELLANEOUS) ×1 IMPLANT
MANIPULATOR VCARE STD CRV RETR (MISCELLANEOUS) ×1 IMPLANT
NS IRRIG 1000ML POUR BTL (IV SOLUTION) ×3 IMPLANT
OCCLUDER COLPOPNEUMO (BALLOONS) ×1 IMPLANT
PACK BASIN MAJOR ARMC (MISCELLANEOUS) ×1 IMPLANT
PACK GYN LAPAROSCOPIC (MISCELLANEOUS) ×3 IMPLANT
PAD DRESSING TELFA 3X8 NADH (GAUZE/BANDAGES/DRESSINGS) ×1 IMPLANT
PAD OB MATERNITY 4.3X12.25 (PERSONAL CARE ITEMS) ×3 IMPLANT
PAD PREP 24X41 OB/GYN DISP (PERSONAL CARE ITEMS) ×3 IMPLANT
PENCIL ELECTRO HAND CTR (MISCELLANEOUS) ×2 IMPLANT
SCISSORS METZENBAUM CVD 33 (INSTRUMENTS) ×1 IMPLANT
SLEEVE ENDOPATH XCEL 5M (ENDOMECHANICALS) ×1 IMPLANT
SPONGE LAP 18X18 5 PK (GAUZE/BANDAGES/DRESSINGS) ×5 IMPLANT
STAPLER SKIN PROX 35W (STAPLE) ×2 IMPLANT
STRIP CLOSURE SKIN 1/2X4 (GAUZE/BANDAGES/DRESSINGS) ×1 IMPLANT
SUT CHROMIC 0 CT 1 (SUTURE) ×1 IMPLANT
SUT CHROMIC GUT 1-0 18 CT-1 (SUTURE) ×1 IMPLANT
SUT MAXON ABS #0 GS21 30IN (SUTURE) ×1 IMPLANT
SUT VIC AB 0 CT1 36 (SUTURE) ×4 IMPLANT
SUT VIC AB 0 CT2 27 (SUTURE) ×3 IMPLANT
SUT VIC AB 2-0 CT2 27 (SUTURE) ×10 IMPLANT
SUT VIC AB 4-0 FS2 27 (SUTURE) ×1 IMPLANT
SYR 50ML LL SCALE MARK (SYRINGE) ×3 IMPLANT
SYRINGE 10CC LL (SYRINGE) ×3 IMPLANT
TROCAR ENDO BLADELESS 11MM (ENDOMECHANICALS) ×1 IMPLANT
TROCAR XCEL NON-BLD 5MMX100MML (ENDOMECHANICALS) ×1 IMPLANT
TUBING INSUFFLATOR HEATED (MISCELLANEOUS) ×3 IMPLANT

## 2015-03-11 NOTE — Op Note (Signed)
Procedure(s): DIAGNOSTIC LAPAROSCOPY LAPAROTOMY--MYOMECTOMY Procedure Note  Michelle Ware female 35 y.o. 03/11/2015  Indications: The patient is a 35 y.o. G1P0010 obese female with fibroid uterus, heavy menses, desiring fertility  Pre-operative Diagnosis: Large fibroid uterus, heavy menses, desiring fertility, obesity (morbid)  Post-operative Diagnosis: Same  Surgeon: Rubie Maid, MD  Assistants: Malachi Paradise, MD  Anesthesia: General LMA anesthesia  ASA Class: II  Procedure Details: The patient was seen in the Holding Room. The risks, benefits, complications, treatment options, and expected outcomes were discussed with the patient.  The patient concurred with the proposed plan, giving informed consent.  The site of surgery properly noted/marked. The patient was taken to the Operating Room, identified as Michelle Ware and the procedure verified as Procedure(s) (LRB): LAPAROSCOPIC  MYOMECTOMY, POSSIBLE LAPAROTOMY. A Time Out was held and the above information confirmed.  She was then placed under general anesthesia without difficulty. She was placed in the dorsal lithotomy position, and was prepped and draped in a sterile manner.  A Foley catheter was inserted into the bladder and attached to constant drainage.  A sterile speculum was inserted into the vagina and the cervix was grasped at the anterior lip using a single-toothed tenaculum.  The uterus was sounded to 10 cm, and a Hulka clamp was placed for uterine manipulation.  The speculum and tenaculum were then removed. After an adequate timeout was performed, attention was turned to the abdomen where an umbilical incision was made with the scalpel. Prior to incision, the skin was injected with 9 ml of 0.5% Sensorcaine. The Optiview 11-mm trocar and sleeve were then advanced without difficulty with the laparoscope under direct visualization into the abdomen.  The abdomen was then insufflated with carbon dioxide gas and adequate  pneumoperitoneum was obtained. A survey of the patient's pelvis and abdomen revealed the findings as above.  The uterus was noted to be enlarged with submucosal fibroid.  We were able to visualize left adnexa and anterior cul-de-sac.  Unable to visualize posterior cul-de-sac or right adnexa due to bulkiness of the uterus.  Uterine manipulation proved to be difficult due to the bulky fibroid and adequate views of the entire  Uterus could not be obtained.  The decision was made at this time to proceed with laparotomy to complete the myomectomy.  The abdomen was allowed to deflate and the trochar was removed. The Hulka clamp was removed.    At this time, Ancef 3 grams IV was administered. Attention was then turned to the abdomen where a vertical midline infraumbilical incision was made with a scalpel, incorporating the previous laparoscopic incision.  This incision was carried down to the fascia using electrocautery.  The fascia was incised in the midline and this incision was extended superiorly and inferiorly using the Bovie.  Kocher's were applied to the left aspect of the fascial incision, and the rectus muscles were dissected off. The peritoneum was identified, and entered bluntly using a hemostat. This incision was extended superiorly and inferiorly using the scalpel with good visualization of the bowel.  Attention was then turned to the uterus, which was elevated out of the pelvis.  A large posterior leiomyomata was noted.  A stitch was placed on the anterior surface of the uterus to help in manipulating the uterus.  The bowel was uterine fibroid size decreased the need for packing the bowel. Vasopressin solution was injected over the surface of the leiomyoma to aid with hemostasis.   A vertical incision was made over the uterine leiomyoma into the leiomyoma,  and the capsule was recognized.  Using blunt methods, the leiomyoma was freed from the surrounding myometrial tissue and removed intact.  After removal  of the leiomyomata which was on the posterior aspects of the uterus, the incision was closed in layers, initiated by using 2-0 Vicryl in a purse-string fashion to close the dead space.  No breach in the uterine cavity was observed.The serosa was reapproximated using 2-0 Vicryl in a running-fashion. Overall good hemostasis was noted.  The uterus was returned to the abdomen.   Copious irrigation was performed. An Interceed sheet was placed over the uterus as an adhesive barrier. Kocher clamps were placed on the peritoneum and fascia were closed using 2 separate 0 Vicryl sutures in a running stitch beginning from each lateral edge and converging in the midline..  The subcutaneous layer was reapproximated with 2-0 Vicryl in a running fashion in 2 layers. The skin was closed with staples. A lidoderm patch was placed on each lateral side of the incision.   The patient tolerated the procedures well.  All instruments, needles, and sponge counts were correct x 2. The patient was taken to the recovery room awake, extubated and in stable condition.   Findings: Exam under anesthesia with enlarge uterus, ~ 14 week size The uterus was sounded to 10 cm. Large posterior submucosal fibroid, ~ 9 cm.  Fallopian tubes and ovaries appeared normal. Prominent sacral promontory   Estimated Blood Loss:  500 ml      Drains: foley catheter to gravity drainage with  650 ml of clear urine         Total IV Fluids:   2000 ml of Normal Saline  Specimens: Leiomyoma         Implants: None         Complications:  None; patient tolerated the procedure well.         Disposition: PACU - hemodynamically stable.         Condition: stable   Rubie Maid, MD [facility]

## 2015-03-11 NOTE — Anesthesia Postprocedure Evaluation (Signed)
Anesthesia Post Note  Patient: Cyrah Barringer  Procedure(s) Performed: Procedure(s) (LRB): LAPAROSCOPIC  MYOMECTOMY--attempted (N/A) LAPAROTOMY--MYOMECTOMY (N/A)  Patient location during evaluation: PACU Anesthesia Type: General Level of consciousness: awake and alert Pain management: pain level controlled Vital Signs Assessment: post-procedure vital signs reviewed and stable Respiratory status: spontaneous breathing, nonlabored ventilation, respiratory function stable and patient connected to nasal cannula oxygen Cardiovascular status: blood pressure returned to baseline and stable Postop Assessment: no signs of nausea or vomiting Anesthetic complications: no    Last Vitals:  Filed Vitals:   03/11/15 1641 03/11/15 1805  BP: 134/68 129/65  Pulse: 69 77  Temp: 36.7 C 36.9 C  Resp: 16 18    Last Pain:  Filed Vitals:   03/11/15 1805  PainSc: [place]

## 2015-03-11 NOTE — Anesthesia Procedure Notes (Signed)
Procedure Name: Intubation Date/Time: 03/11/2015 11:32 AM Performed by: Alda Berthold Pre-anesthesia Checklist: Patient identified, Patient being monitored, Timeout performed, Emergency Drugs available and Suction available Patient Re-evaluated:Patient Re-evaluated prior to inductionOxygen Delivery Method: Circle system utilized Preoxygenation: Pre-oxygenation with 100% oxygen Intubation Type: IV induction Ventilation: Mask ventilation without difficulty Laryngoscope Size: Mac and 3 Grade View: Grade I Tube type: Oral Tube size: 6.5 mm Number of attempts: 1 Placement Confirmation: ETT inserted through vocal cords under direct vision,  positive ETCO2 and breath sounds checked- equal and bilateral Secured at: 22 cm Tube secured with: Tape Dental Injury: Teeth and Oropharynx as per pre-operative assessment

## 2015-03-11 NOTE — Transfer of Care (Signed)
Immediate Anesthesia Transfer of Care Note  Patient: Michelle Ware  Procedure(s) Performed: Procedure(s): LAPAROSCOPIC  MYOMECTOMY--attempted (N/A) LAPAROTOMY--MYOMECTOMY (N/A)  Patient Location: PACU  Anesthesia Type:General  Level of Consciousness: awake, alert , oriented and patient cooperative  Airway & Oxygen Therapy: Patient Spontanous Breathing and Patient connected to nasal cannula oxygen  Post-op Assessment: Report given to RN and Post -op Vital signs reviewed and stable  Post vital signs: Reviewed and stable  Last Vitals:  Filed Vitals:   03/11/15 1056  BP: 179/98  Pulse: 74  Temp: 36.9 C  Resp: 16    Complications: No apparent anesthesia complications  See most recent VS in EPIC. On admission to pacu b/p 109/56, HR 59, resp 16 and 100% SPO2 on 2L n/c

## 2015-03-11 NOTE — H&P (Addendum)
UPDATE TO PREVIOUS HISTORY AND PHYSICAL  The patient has been seen and examined.  H&P is up to date, no changes noted. Michelle Ware is a 35 y.o. P52 female scheduled for laparoscopic myomectomy. Indications for procedure are large leiomyoma, desired fertility.  Patient also notes at bedside that periods are often heavy. Patient can proceed to the OR for scheduled procedure.   Rubie Maid, MD 03/11/2015 10:42 AM

## 2015-03-11 NOTE — Anesthesia Preprocedure Evaluation (Signed)
Anesthesia Evaluation  Patient identified by MRN, date of birth, ID band Patient awake    Reviewed: Allergy & Precautions, NPO status , Patient's Chart, lab work & pertinent test results  History of Anesthesia Complications Negative for: history of anesthetic complications  Airway Mallampati: II       Dental  (+) Teeth Intact   Pulmonary Current Smoker,    breath sounds clear to auscultation       Cardiovascular Exercise Tolerance: Good hypertension, Pt. on medications  Rhythm:Regular Rate:Normal     Neuro/Psych    GI/Hepatic negative GI ROS, Neg liver ROS,   Endo/Other  negative endocrine ROSMorbid obesity  Renal/GU negative Renal ROS     Musculoskeletal negative musculoskeletal ROS (+)   Abdominal (+) + obese,   Peds  Hematology   Anesthesia Other Findings   Reproductive/Obstetrics                             Anesthesia Physical Anesthesia Plan  ASA: II  Anesthesia Plan: General   Post-op Pain Management:    Induction: Intravenous  Airway Management Planned: Oral ETT  Additional Equipment:   Intra-op Plan:   Post-operative Plan: Extubation in OR  Informed Consent: I have reviewed the patients History and Physical, chart, labs and discussed the procedure including the risks, benefits and alternatives for the proposed anesthesia with the patient or authorized representative who has indicated his/her understanding and acceptance.     Plan Discussed with: CRNA  Anesthesia Plan Comments:         Anesthesia Quick Evaluation

## 2015-03-12 ENCOUNTER — Encounter: Payer: Self-pay | Admitting: Obstetrics and Gynecology

## 2015-03-12 LAB — CBC
HCT: 30.5 % — ABNORMAL LOW (ref 35.0–47.0)
Hemoglobin: 9.6 g/dL — ABNORMAL LOW (ref 12.0–16.0)
MCH: 24.5 pg — ABNORMAL LOW (ref 26.0–34.0)
MCHC: 31.6 g/dL — AB (ref 32.0–36.0)
MCV: 77.5 fL — ABNORMAL LOW (ref 80.0–100.0)
PLATELETS: 271 10*3/uL (ref 150–440)
RBC: 3.93 MIL/uL (ref 3.80–5.20)
RDW: 14.9 % — AB (ref 11.5–14.5)
WBC: 11.5 10*3/uL — AB (ref 3.6–11.0)

## 2015-03-12 MED ORDER — AMMONIA AROMATIC IN INHA
RESPIRATORY_TRACT | Status: AC
  Filled 2015-03-12: qty 10

## 2015-03-12 NOTE — Progress Notes (Signed)
Post-Op Day 1: LAPAROSCOPIC  MYOMECTOMY--attempted (N/A) LAPAROTOMY--MYOMECTOMY (N/A)  Subjective: Patient reports incisional pain and tolerating PO but with decreased appetite.  Denies nausea/vomiting.     Objective: I have reviewed patient's vital signs, intake and output, medications and labs.  Temp:  [98.1 F (36.7 C)-99.1 F (37.3 C)] 98.6 F (37 C) (02/21 2004) Pulse Rate:  [76-96] 82 (02/21 2038) Resp:  [16-20] 20 (02/21 2004) BP: (117-178)/(52-98) 152/86 mmHg (02/21 2038) SpO2:  [97 %-98 %] 97 % (02/21 2004)   General: alert and no distress Resp: clear to auscultation bilaterally Cardio: regular rate and rhythm, S1, S2 normal, no murmur, click, rub or gallop GI: normal findings: soft.  Bandage in place and incision: bandage clean/dry/intact Extremities: extremities normal, atraumatic, no cyanosis or edema Vaginal Bleeding: minimal    Labs:  CBC Latest Ref Rng 03/12/2015 03/05/2015 10/26/2014  WBC 3.6 - 11.0 K/uL 11.5(H) 6.3 8.0  Hemoglobin 12.0 - 16.0 g/dL 9.6(L) 11.3(L) -  Hematocrit 35.0 - 47.0 % 30.5(L) 35.2 33.9(L)  Platelets 150 - 440 K/uL 271 266 267     Assessment: s/p Procedure(s): LAPAROSCOPIC  MYOMECTOMY--attempted (N/A) LAPAROTOMY--MYOMECTOMY (N/A): stable and anemia  Plan: Advance diet Encourage ambulation Advance to PO medication Discontinue IV fluids  PO treatment of anemia with ferrous sulfate. Currently asymptomatic. Dispo: likely d/c home tomorrow.      Michelle Ware 03/12/2015, 2:17 PM

## 2015-03-13 DIAGNOSIS — D250 Submucous leiomyoma of uterus: Secondary | ICD-10-CM | POA: Diagnosis present

## 2015-03-13 DIAGNOSIS — I10 Essential (primary) hypertension: Secondary | ICD-10-CM | POA: Diagnosis present

## 2015-03-13 DIAGNOSIS — F17290 Nicotine dependence, other tobacco product, uncomplicated: Secondary | ICD-10-CM | POA: Diagnosis present

## 2015-03-13 DIAGNOSIS — D649 Anemia, unspecified: Secondary | ICD-10-CM | POA: Diagnosis present

## 2015-03-13 LAB — SURGICAL PATHOLOGY

## 2015-03-13 MED ORDER — HYDROMORPHONE HCL 2 MG PO TABS: 2.0000 mg | ORAL_TABLET | Freq: Four times a day (QID) | ORAL | Status: DC | PRN

## 2015-03-13 MED ORDER — DOCUSATE SODIUM 100 MG PO CAPS: 100.0000 mg | ORAL_CAPSULE | Freq: Two times a day (BID) | ORAL | Status: DC | PRN

## 2015-03-13 MED ORDER — FERROUS SULFATE 325 (65 FE) MG PO TABS: 325.0000 mg | ORAL_TABLET | Freq: Two times a day (BID) | ORAL | Status: DC

## 2015-03-13 MED ORDER — IBUPROFEN 800 MG PO TABS
800.0000 mg | ORAL_TABLET | Freq: Three times a day (TID) | ORAL | Status: DC | PRN
  Administered 2015-03-13 – 2015-03-14 (×3): 800 mg via ORAL
  Filled 2015-03-13 (×3): qty 1

## 2015-03-13 MED ORDER — IBUPROFEN 800 MG PO TABS: 800.0000 mg | ORAL_TABLET | Freq: Three times a day (TID) | ORAL | Status: DC | PRN

## 2015-03-13 MED ORDER — HYDROMORPHONE HCL 2 MG PO TABS
2.0000 mg | ORAL_TABLET | Freq: Four times a day (QID) | ORAL | Status: DC | PRN
  Administered 2015-03-13 – 2015-03-14 (×2): 2 mg via ORAL
  Filled 2015-03-13 (×2): qty 1

## 2015-03-13 MED ORDER — HYDROCHLOROTHIAZIDE 12.5 MG PO CAPS
12.5000 mg | ORAL_CAPSULE | Freq: Every day | ORAL | Status: DC
  Administered 2015-03-13 – 2015-03-14 (×2): 12.5 mg via ORAL
  Filled 2015-03-13 (×2): qty 1

## 2015-03-13 MED ORDER — FERROUS SULFATE 325 (65 FE) MG PO TABS
325.0000 mg | ORAL_TABLET | Freq: Two times a day (BID) | ORAL | Status: DC
  Administered 2015-03-13 – 2015-03-14 (×2): 325 mg via ORAL
  Filled 2015-03-13 (×2): qty 1

## 2015-03-13 NOTE — Progress Notes (Addendum)
Post-Op Day 2: LAPAROSCOPIC  MYOMECTOMY--attempted (N/A) LAPAROTOMY--MYOMECTOMY (N/A)  Subjective: Patient reports decreased appetite.  Had episode of nausea/vomiting x 1 yesterday.  Notes pain better controlled today.  Ambulating and voiding without difficulty.  Not passing flatus but is burping.       Objective: I have reviewed patient's vital signs, intake and output, medications and labs.  Blood pressure 138/74, pulse 89, temperature 97.9 F (36.6 C), temperature source Oral, resp. rate 22, height 5\' 10"  (1.778 m), weight 290 lb (131.543 kg), last menstrual period 02/03/2015, SpO2 96 %.  General: alert and no distress Resp: clear to auscultation bilaterally Cardio: regular rate and rhythm, S1, S2 normal, no murmur, click, rub or gallop GI: normal findings: soft.  Bandage in place and incision: bandage clean/dry/intact.  Abdomen mildly distended, tympanic.  Extremities: extremities normal, atraumatic, no cyanosis or edema Vaginal Bleeding: minimal    Labs:  CBC Latest Ref Rng 03/12/2015 03/05/2015 10/26/2014  WBC 3.6 - 11.0 K/uL 11.5(H) 6.3 8.0  Hemoglobin 12.0 - 16.0 g/dL 9.6(L) 11.3(L) -  Hematocrit 35.0 - 47.0 % 30.5(L) 35.2 33.9(L)  Platelets 150 - 440 K/uL 271 266 267     Assessment: s/p Procedure(s): LAPAROSCOPIC  MYOMECTOMY--attempted (N/A) LAPAROTOMY--MYOMECTOMY (N/A): stable and anemia  Plan: Advance diet as tolerated.  Continue to encourage ambulation Change PO medication from Percocet to Dilaudid Given milk of magnesium for bloating.  PO treatment of anemia with ferrous sulfate. Currently asymptomatic. Dispo: possible d/c home later today or tomorrow.      Rubie Maid 03/13/2015, 8:28 AM

## 2015-03-14 NOTE — Discharge Instructions (Signed)
General Gynecological Post-Operative Instructions You may expect to feel dizzy, weak, and drowsy for as long as 24 hours after receiving the medicine that made you sleep (anesthetic).  Do not drive a car, ride a bicycle, participate in physical activities, or take public transportation until you are done taking narcotic pain medicines or as directed by your doctor.  Do not drink alcohol or take tranquilizers.  Do not take medicine that has not been prescribed by your doctor.  Do not sign important papers or make important decisions while on narcotic pain medicines.  Have a responsible person with you.  CARE OF INCISION  Keep incision clean and dry. Take showers instead of baths until your doctor gives you permission to take baths.  Avoid heavy lifting (more than 10 pounds/4.5 kilograms), pushing, or pulling.  Avoid activities that may risk injury to your surgical site.  No sexual intercourse or placement of anything in the vagina for 4 weeks or as instructed by your doctor. If you have tubes coming from the wound site, check with your doctor regarding appropriate care of the tubes. Only take prescription or over-the-counter medicines  for pain, discomfort, or fever as directed by your doctor. Do not take aspirin. It can make you bleed. Take medicines (antibiotics) that kill germs if they are prescribed for you.  Call the office or go to the Emergency Room if:  You feel sick to your stomach (nauseous).  You start to throw up (vomit).  You have trouble eating or drinking.  You have an oral temperature above 101.  You have constipation that is not helped by adjusting diet or increasing fluid intake. Pain medicines are a common cause of constipation.  You have any other concerns. SEEK IMMEDIATE MEDICAL CARE IF:  You have persistent dizziness.  You have difficulty breathing or a congested sounding (croupy) cough.  You have an oral temperature above 102.5, not controlled by medicine.  There is  increasing pain or tenderness near or in the surgical site.

## 2015-03-14 NOTE — Discharge Summary (Signed)
Gynecology Physician Postoperative Discharge Summary  Patient ID: Michelle Ware MRN: [id number] DOB/AGE: November 12, 1980 35 y.o.  Admit Date: 03/11/2015 Discharge Date: 03/14/2015  Preoperative Diagnoses: Fibroid uterus, heavy menses, desires fertility  Procedures: Procedure(s) (LRB): LAPAROSCOPIC  MYOMECTOMY--attempted (N/A) LAPAROTOMY--MYOMECTOMY (N/A)  Hospital Course:  Michelle Ware is a 35 y.o. G1P0010  admitted for scheduled surgery.  She underwent the procedures as mentioned above, her operation was uncomplicated. For further details about surgery, please refer to the operative report. Patient had an uncomplicated postoperative course. By time of discharge on POD#3, her pain was controlled on oral pain medications; she was ambulating, voiding without difficulty, mildly tolerating regular diet and passing flatus. She was deemed stable for discharge to home.   Significant Labs: CBC Latest Ref Rng 03/12/2015 03/05/2015 10/26/2014  WBC 3.6 - 11.0 K/uL 11.5(H) 6.3 8.0  Hemoglobin 12.0 - 16.0 g/dL 9.6(L) 11.3(L) -  Hematocrit 35.0 - 47.0 % 30.5(L) 35.2 33.9(L)  Platelets 150 - 440 K/uL 271 266 267    Discharge Exam: Blood pressure 153/76, pulse 90, temperature 98.4 F (36.9 C), temperature source Oral, resp. rate 18, height 5\' 10"  (1.778 m), weight 290 lb (131.543 kg), last menstrual period 02/03/2015, SpO2 100 %. General appearance: alert and no distress  Resp: clear to auscultation bilaterally  Cardio: regular rate and rhythm  GI: soft, non-tender; bowel sounds normal; no masses, no organomegaly.  Incision: C/D/I, no erythema, no drainage noted Pelvic: scant blood on pad  Extremities: extremities normal, atraumatic, no cyanosis or edema and Homans sign is negative, no sign of DVT  Discharged Condition: Stable  Disposition: Final discharge disposition not confirmed     Medication List    TAKE these medications        cholecalciferol 1000 units tablet  Commonly known as:  VITAMIN D   Take 1,000 Units by mouth daily.     docusate sodium 100 MG capsule  Commonly known as:  COLACE  Take 1 capsule (100 mg total) by mouth 2 (two) times daily as needed for mild constipation.     ferrous sulfate 325 (65 FE) MG tablet  Commonly known as:  FERROUSUL  Take 1 tablet (325 mg total) by mouth 2 (two) times daily.     hydrochlorothiazide 12.5 MG tablet  Commonly known as:  HYDRODIURIL  Take 1 tablet (12.5 mg total) by mouth daily.     HYDROmorphone 2 MG tablet  Commonly known as:  DILAUDID  Take 1 tablet (2 mg total) by mouth every 6 (six) hours as needed for severe pain.     ibuprofen 800 MG tablet  Commonly known as:  ADVIL,MOTRIN  Take 1 tablet (800 mg total) by mouth every 8 (eight) hours as needed.     valACYclovir 500 MG tablet  Commonly known as:  VALTREX  TAKE 1 TABLET BY MOUTH DAILY FOR PREVENTION AND THREE TIMES DAILY FOR OUTBREAKS           Follow-up Information    Follow up with Rubie Maid, MD In 1 week.   Specialties:  Obstetrics and Gynecology, Radiology   Why:  For wound re-check   Contact information:   [address] [place] [address] [phone]       Signed:  Rubie Maid, MD [facility]

## 2015-03-14 NOTE — Progress Notes (Signed)
Patient understands all discharge instructions and the need to make follow up appointments. Patient discharge via wheelchair with auxillary. 

## 2015-03-14 NOTE — Progress Notes (Signed)
Post-Op Day 3: LAPAROSCOPIC  MYOMECTOMY--attempted (N/A) LAPAROTOMY--MYOMECTOMY (N/A)  Subjective: Patient still notes decreased appetite but slowly increasing.  No further episodes of nausea/vomiting.  Notes pain has improved.  Ambulating and voiding without difficulty. Is passing flatus.       Objective: I have reviewed patient's vital signs, intake and output, medications and labs.  Blood pressure 129/63, pulse 89, temperature 98.3 F (36.8 C), temperature source Oral, resp. rate 18, height 5\' 10"  (1.778 m), weight 290 lb (131.543 kg), last menstrual period 02/03/2015, SpO2 97 %.  General: alert and no distress Resp: clear to auscultation bilaterally Cardio: regular rate and rhythm, S1, S2 normal, no murmur, click, rub or gallop GI: normal findings: soft.  Bandage in place and incision: bandage clean/dry/intact.  Abdomen less distended (improved from yesterday), tympanic.  Extremities: extremities normal, atraumatic, no cyanosis or edema Vaginal Bleeding: minimal    Labs:  CBC Latest Ref Rng 03/12/2015 03/05/2015 10/26/2014  WBC 3.6 - 11.0 K/uL 11.5(H) 6.3 8.0  Hemoglobin 12.0 - 16.0 g/dL 9.6(L) 11.3(L) -  Hematocrit 35.0 - 47.0 % 30.5(L) 35.2 33.9(L)  Platelets 150 - 440 K/uL 271 266 267     Assessment: s/p Procedure(s): LAPAROSCOPIC  MYOMECTOMY--attempted (N/A) LAPAROTOMY--MYOMECTOMY (N/A): stable and anemia  Plan: Continue to advance diet as tolerated.  Continue to encourage ambulation Continue PO pain medication PO treatment of anemia with ferrous sulfate. Currently asymptomatic. Dispo: d/c home today.      LOS: 1 day    Rubie Maid 03/14/2015, 8:14 AM

## 2015-03-15 ENCOUNTER — Telehealth: Payer: Self-pay | Admitting: Obstetrics and Gynecology

## 2015-03-15 DIAGNOSIS — R143 Flatulence: Secondary | ICD-10-CM

## 2015-03-15 MED ORDER — SIMETHICONE 125 MG PO CAPS: 125.0000 mg | ORAL_CAPSULE | Freq: Four times a day (QID) | ORAL | Status: DC

## 2015-03-15 NOTE — Telephone Encounter (Signed)
PT CALLED AND SHE STATED THAT SHE IS NOT GETTING ANY RELIEF WITH THE PAIN AND WANTED TO KNOW IF THERE WAS SOMETHING ELSE THAT COULD BE CALLED IN FOR HER.

## 2015-03-15 NOTE — Telephone Encounter (Signed)
Pt had surgery on Monday and had abdominal surgery (not laparoscopic). Her mother noted some bleeding from one of the staple sights. Area closed, no fever, no drainage. Pt states she is just sore. Informed pt to watch area for any reddness, fever, purlent drainage, or area or any areas open and to contact office if any further problems. Pt made an appt for Tues. 28th to have staples removed.

## 2015-03-15 NOTE — Telephone Encounter (Signed)
PT CALLED AND SHE HAD SURGERY ON Monday AND SHE WOKE UP THIS MORNING AND SHE HAD SOME BLEEDING COMING FROM ONE OF HER STICHES, [facility] WILL NOT BE HERE UNTIL LUNCH TODAY SHE IS AT [employer] SO THAT'S WHY I SENT MESSAGE TO YOU.

## 2015-03-15 NOTE — Telephone Encounter (Signed)
PT CALLED AND SAID THAT SHE PAID FOR HER FMLA PPW LAST WEEK AND HER EMPLOYMENT CONTACTED HER AND THEY DO NO HAVE THE FMLA PPW, I DIDN'T SEE IT UP HER IN OUR DRAWER, BY ANY CHANCE DO YOU KNOW ABOUT THIS.

## 2015-03-15 NOTE — Telephone Encounter (Signed)
Called pt she states that she is having a lot of pain under the rib cage that is not relieved with Diluadid. Also complains that Dilaudid is making her sleepy. Advised to only use Dialuadid as needed and use Ibuprofen in between doses. Advised pt that this is likely gas pain, RX sent in for Gas-X per Dr.Cherry.

## 2015-03-15 NOTE — Telephone Encounter (Signed)
Paperwork is ready and has been faxed. Thanks.

## 2015-03-19 ENCOUNTER — Ambulatory Visit (INDEPENDENT_AMBULATORY_CARE_PROVIDER_SITE_OTHER): Payer: Managed Care, Other (non HMO) | Admitting: Obstetrics and Gynecology

## 2015-03-19 ENCOUNTER — Encounter: Payer: Self-pay | Admitting: Obstetrics and Gynecology

## 2015-03-19 VITALS — BP 159/82 | HR 120 | Ht 70.0 in | Wt 276.2 lb

## 2015-03-19 DIAGNOSIS — Z9889 Other specified postprocedural states: Secondary | ICD-10-CM

## 2015-03-25 ENCOUNTER — Other Ambulatory Visit: Payer: Self-pay | Admitting: Family Medicine

## 2015-03-25 NOTE — Telephone Encounter (Signed)
Patient requesting refill. 

## 2015-03-25 NOTE — Progress Notes (Signed)
   GYNECOLOGY POST-OPERATIVE CLINIC VISIT   Subjective:     Michelle Ware is a 35 y.o. G42P0010 female who presents to the clinic 10 days status post diagnostic laparoscopy with abdominal myomectomy for fibroids. Eating a regular diet without difficulty, however still notes decreased appetite.  Notes pain under ribs when lying recumbent, feels better when upright. when  Bowel movements are normal. Pain is controlled with current analgesics. Medications being used: prescription NSAID's including ibuprofen (Motrin) and narcotic analgesics including hydromorphone (Dilaudid).  Notes compliance with iron tablets for anemia.   The following portions of the patient's history were reviewed and updated as appropriate: allergies, current medications, past family history, past medical history, past social history, past surgical history and problem list.  Review of Systems Pertinent items are noted in HPI.    Objective:    BP 159/82 mmHg  Pulse 120  Ht 5\' 10"  (1.778 m)  Wt 276 lb 3.2 oz (125.283 kg)  BMI 39.63 kg/m2 General:  alert and no distress  Abdomen: soft, bowel sounds active, no abnormal masses, appropriately tender at incision site.  Incision:   midline infraumbilical incision healing well, no drainage, no erythema, no hernia, no seroma, no swelling, staples in place, no dehiscence, incision well approximated       Labs:  Lab Results  Component Value Date   HGB 9.6* 03/12/2015     Pathology:  A. 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  - PROLIFERATIVE ENDOMETRIUM, SEE COMMENT.   Comment:  The specimen contains endometrium with a linear extent of at least 1.8  cm, along one aspect of the leiomyoma.  Assessment:   Doing well postoperatively.  S/p diagnostic laparoscopy and abdominal myomectomy.  Anemia - moderate.  Continue with BID ferrous sulfate.  HTN -poorly controlled.     Plan:    1. Continue any current medications. 2. Wound care discussed.   Staples removed today and steri-strips applied. 3. Activity restrictions: no bending, stooping, or squatting and no lifting more than 15 pounds 4. Anticipated return to work: 4 weeks. 5. Operative findings again reviewed. Pathology report discussed.  Discussed that based on pathology report, the endometrial cavity was breached.  For future pregnancies, will require a C-section, cannot labor.  Also will need an HSG following final post-op check to assess uterine cavity and tubes.  6. Follow up: 4 weeks for final post-op checkl.     Rubie Maid, MD [facility]

## 2015-03-26 ENCOUNTER — Telehealth: Payer: Self-pay | Admitting: Obstetrics and Gynecology

## 2015-03-26 NOTE — Telephone Encounter (Signed)
Called pt, no answer. Unable to leave message as no voicemail is not set up.

## 2015-03-26 NOTE — Telephone Encounter (Signed)
PT HAD STAPLES OUT Tuesday 2/28 AND HAS NOTICED THAT 1 PLACE LOOKS LIKE IT IS COMING OPEN. NOT BLEEDING.

## 2015-03-26 NOTE — Telephone Encounter (Signed)
Pt called  you back.

## 2015-03-27 ENCOUNTER — Encounter: Payer: Self-pay | Admitting: Obstetrics and Gynecology

## 2015-03-27 ENCOUNTER — Ambulatory Visit (INDEPENDENT_AMBULATORY_CARE_PROVIDER_SITE_OTHER): Payer: Managed Care, Other (non HMO) | Admitting: Obstetrics and Gynecology

## 2015-03-27 VITALS — BP 143/82 | HR 89 | Ht 70.0 in | Wt 280.1 lb

## 2015-03-27 DIAGNOSIS — Z5189 Encounter for other specified aftercare: Secondary | ICD-10-CM

## 2015-03-27 DIAGNOSIS — E669 Obesity, unspecified: Secondary | ICD-10-CM

## 2015-03-27 DIAGNOSIS — Z9889 Other specified postprocedural states: Secondary | ICD-10-CM

## 2015-03-27 NOTE — Telephone Encounter (Signed)
Pt coming in for appt 03/27/15.

## 2015-03-27 NOTE — Progress Notes (Signed)
   GYNECOLOGY POST-OPERATIVE CLINIC VISIT   Subjective:     Michelle Ware is a 35 y.o. G73P0010 female who presents to the clinic 3 week status post diagnostic laparoscopy with abdominal myomectomy for fibroids. Patient c/o area of incision opening up with slight white discharge. Also notes mild tinge of pain near the area.   The following portions of the patient's history were reviewed and updated as appropriate: allergies, current medications, past family history, past medical history, past social history, past surgical history and problem list.  Review of Systems Pertinent items are noted in HPI.    Objective:    BP 143/82 mmHg  Pulse 89  Ht 5\' 10"  (1.778 m)  Wt 280 lb 1.6 oz (127.053 kg)  BMI 40.19 kg/m2 General:  alert and no distress  Abdomen: soft, bowel sounds active, no abnormal masses, appropriately tender at incision site.  Incision:   midline infraumbilical incision healing well, small area ~ 2 cm in length at upper 1/3 of incision with skin edges slightly unapproximated (closed, but edges not completely aligned.        Assessment:   Wound check Obesity S/p diagnostic laparoscopy with abdominal myomectomy  Plan:   1. Continue any current medications. 2. Wound care discussed. New steri-strips applied to unapproximated edges. 3. Activity restrictions: no bending, stooping, or squatting and no lifting more than 15 pounds 4. Anticipated return to work: 2-3 weeks. 5. Follow up: 3 weeks for final post-op checkl.     Rubie Maid, MD [facility]

## 2015-04-16 ENCOUNTER — Ambulatory Visit (INDEPENDENT_AMBULATORY_CARE_PROVIDER_SITE_OTHER): Payer: Managed Care, Other (non HMO) | Admitting: Obstetrics and Gynecology

## 2015-04-16 ENCOUNTER — Encounter: Payer: Self-pay | Admitting: Obstetrics and Gynecology

## 2015-04-16 VITALS — BP 117/78 | HR 78 | Ht 70.0 in | Wt 281.0 lb

## 2015-04-16 DIAGNOSIS — D62 Acute posthemorrhagic anemia: Secondary | ICD-10-CM

## 2015-04-16 DIAGNOSIS — E66813 Obesity, class 3: Secondary | ICD-10-CM

## 2015-04-16 DIAGNOSIS — Z9889 Other specified postprocedural states: Secondary | ICD-10-CM

## 2015-04-18 DIAGNOSIS — D62 Acute posthemorrhagic anemia: Secondary | ICD-10-CM | POA: Insufficient documentation

## 2015-04-18 NOTE — Progress Notes (Signed)
   GYNECOLOGY POST-OPERATIVE CLINIC VISIT   Subjective:     Michelle Ware is a 35 y.o. G19P0010 female who presents to the clinic 5 weeks status post diagnostic laparoscopy with abdominal myomectomy for fibroids. Patient denies complaints today.  She is tolerating a regular diet.  Bowel movements are normal. Patient denies pain.  Not taking any medications.   The following portions of the patient's history were reviewed and updated as appropriate: allergies, current medications, past family history, past medical history, past social history, past surgical history and problem list.  Review of Systems Pertinent items are noted in HPI.    Objective:    BP 117/78 mmHg  Pulse 78  Ht 5\' 10"  (1.778 m)  Wt 281 lb (127.461 kg)  BMI 40.32 kg/m2  LMP 04/15/2015 General:  alert and no distress  Abdomen: soft, bowel sounds active, no abnormal masses, appropriately tender at incision site.  Incision:   midline infraumbilical incision well-approximated, well- healed.  No erythema, tenderness, or drainage present. .        Labs:  Lab Results  Component Value Date   WBC 11.5* 03/12/2015   HGB 9.6* 03/12/2015   HCT 30.5* 03/12/2015   MCV 77.5* 03/12/2015   PLT 271 03/12/2015    Assessment:   Post-operative check Obesity S/p diagnostic laparoscopy with abdominal myomectomy (with breach of endometrial cavity) Anemia  Plan:   1. Wound care discussed. Can use Vitamin E oil, coccoa butter, or other dermal treatments to reduce appearance of scar at this time if desired.  2.  Activity restrictions: none  3. Anemia - patient has been taking iron supplements as prescribed.  Will repeat Hgb today.  4. Anticipated return to work: work letter provided and can return next week (desires to start back 04/24/15). 5. Follow up: Will be scheduled for HSG to assess patency of endometrial cavity and fallopian tubes due to size, location, and extent of involvement of large fibroid that was excised.    Rubie Maid, MD [facility]

## 2015-05-15 ENCOUNTER — Other Ambulatory Visit: Payer: Self-pay | Admitting: Family Medicine

## 2015-05-15 NOTE — Telephone Encounter (Signed)
Patient requesting refill. 

## 2015-05-16 ENCOUNTER — Ambulatory Visit: Admission: RE | Admit: 2015-05-16 | Payer: Managed Care, Other (non HMO) | Source: Ambulatory Visit

## 2015-05-22 ENCOUNTER — Ambulatory Visit: Payer: Managed Care, Other (non HMO) | Admitting: Family Medicine

## 2015-05-23 ENCOUNTER — Encounter: Payer: Self-pay | Admitting: Family Medicine

## 2015-05-23 ENCOUNTER — Ambulatory Visit (INDEPENDENT_AMBULATORY_CARE_PROVIDER_SITE_OTHER): Payer: Managed Care, Other (non HMO) | Admitting: Family Medicine

## 2015-05-23 VITALS — BP 124/72 | HR 81 | Temp 97.6°F | Resp 16 | Ht 70.0 in | Wt 280.7 lb

## 2015-05-23 DIAGNOSIS — D509 Iron deficiency anemia, unspecified: Secondary | ICD-10-CM | POA: Diagnosis not present

## 2015-05-23 DIAGNOSIS — Z13220 Encounter for screening for lipoid disorders: Secondary | ICD-10-CM

## 2015-05-23 DIAGNOSIS — R946 Abnormal results of thyroid function studies: Secondary | ICD-10-CM | POA: Diagnosis not present

## 2015-05-23 DIAGNOSIS — Z9889 Other specified postprocedural states: Secondary | ICD-10-CM

## 2015-05-23 DIAGNOSIS — E010 Iodine-deficiency related diffuse (endemic) goiter: Secondary | ICD-10-CM

## 2015-05-23 DIAGNOSIS — Z8742 Personal history of other diseases of the female genital tract: Secondary | ICD-10-CM

## 2015-05-23 DIAGNOSIS — E66813 Obesity, class 3: Secondary | ICD-10-CM

## 2015-05-23 DIAGNOSIS — I10 Essential (primary) hypertension: Secondary | ICD-10-CM

## 2015-05-23 DIAGNOSIS — R7989 Other specified abnormal findings of blood chemistry: Secondary | ICD-10-CM

## 2015-05-23 DIAGNOSIS — A6000 Herpesviral infection of urogenital system, unspecified: Secondary | ICD-10-CM | POA: Diagnosis not present

## 2015-05-23 DIAGNOSIS — E282 Polycystic ovarian syndrome: Secondary | ICD-10-CM | POA: Diagnosis not present

## 2015-05-23 DIAGNOSIS — Z87898 Personal history of other specified conditions: Secondary | ICD-10-CM

## 2015-05-23 DIAGNOSIS — E049 Nontoxic goiter, unspecified: Secondary | ICD-10-CM | POA: Diagnosis not present

## 2015-05-23 DIAGNOSIS — Z8759 Personal history of other complications of pregnancy, childbirth and the puerperium: Secondary | ICD-10-CM

## 2015-05-23 DIAGNOSIS — E559 Vitamin D deficiency, unspecified: Secondary | ICD-10-CM

## 2015-05-23 MED ORDER — VALACYCLOVIR HCL 500 MG PO TABS: ORAL_TABLET | ORAL | Status: DC

## 2015-05-23 MED ORDER — SCOPOLAMINE 1 MG/3DAYS TD PT72: 1.0000 | MEDICATED_PATCH | TRANSDERMAL | Status: DC

## 2015-05-23 MED ORDER — HYDROCHLOROTHIAZIDE 12.5 MG PO TABS: 12.5000 mg | ORAL_TABLET | Freq: Every day | ORAL | Status: DC

## 2015-05-23 NOTE — Addendum Note (Signed)
Addended by: [name] F on: 05/23/2015 08:24 AM   Modules accepted: Orders

## 2015-05-23 NOTE — Progress Notes (Addendum)
Name: Michelle Ware   MRN: [id number]    DOB: September 14, 1980   Date:05/23/2015       Progress Note  Subjective  Chief Complaint  Chief Complaint  Patient presents with  . Medication Refill    valtrex. also patient would like a rx for the moion sickness patch. she will be going on a cruise in 2 weeks.    HPI  Genital Herpes: she was diagnosed years ago and has been taking Valtrex daily, last outbreak was after myomectomy Feb 2017. Lesion are usually in her vulva.  HTN: taking HCTZ and denies side effects of medication. No chest pain or palpitation  Obesity: weight has been stable, recently has started to change her diet, eating more greens, avoids sweets, more lean protein. She has been working 12 hours and has not been able to exercise lately.   S/p Myomectomy in Feb 2017, doing well, cycles are back to normal. She had it done after miscarriage last fall. She lost blood during the procedure and was anemic, she has stopped taking supplements because of constipation.  Goiter: we need to re-schedule thyroid US, last TSH was normal  PCOS: she has hypertrichosis, cycles are between 28-30 days. Last hgbA1C was normal.   Patient Active Problem List   Diagnosis Date Noted  . Postoperative anemia due to acute blood loss 04/18/2015  . S/P myomectomy 03/11/2015  . Essential hypertension 11/28/2014  . Obesity, Class III, BMI 40-49.9 (morbid obesity) ([place]) 11/28/2014  . Decreased thyroid stimulating hormone (TSH) level 07/24/2014  . Hypertrichosis 07/24/2014  . Vitamin D deficiency 07/24/2014  . Thyromegaly 07/24/2014  . PCOS (polycystic ovarian syndrome) 07/24/2014  . History of abnormal cervical Pap smear 11/24/2011  . Genital herpes 11/29/2008    Past Surgical History  Procedure Laterality Date  . Mouth surgery  1996  . Laparoscopic gelport assisted myomectomy N/A 03/11/2015    Procedure: LAPAROSCOPIC  MYOMECTOMY--attempted;  Surgeon: Rubie Maid, MD;  Location: [facility];  Service:  Gynecology;  Laterality: N/A;  . Laparotomy N/A 03/11/2015    Procedure: LAPAROTOMY--MYOMECTOMY;  Surgeon: Rubie Maid, MD;  Location: [facility] OR[facility];  Service: Gynecology;  Laterality: N/A;    Family History  Problem Relation Age of Onset  . Thyroid disease Mother   . Hypertension Mother     Social History   Social History  . Marital Status: Single    Spouse Name: N/A  . Number of Children: N/A  . Years of Education: N/A   Occupational History  . Not on file.   Social History Main Topics  . Smoking status: Never Smoker   . Smokeless tobacco: Never Used  . Alcohol Use: 0.0 oz/week    0 Standard drinks or equivalent per week     Comment: 2/week  . Drug Use: No  . Sexual Activity:    Partners: Male   Other Topics Concern  . Not on file   Social History Narrative     Current outpatient prescriptions:  .  cholecalciferol (VITAMIN D) 1000 units tablet, Take 1,000 Units by mouth daily., Disp: , Rfl:  .  hydrochlorothiazide (HYDRODIURIL) 12.5 MG tablet, Take 1 tablet (12.5 mg total) by mouth daily., Disp: 30 tablet, Rfl: 5 .  valACYclovir (VALTREX) 500 MG tablet, TAKE 1 TABLET BY MOUTH DAILY FOR PREVENTION AND THREE TIMES DAILY FOR OUTBREAKS, Disp: 40 tablet, Rfl: 5  Allergies  Allergen Reactions  . Hydrocodone Itching     ROS  Constitutional: Negative for fever or weight change.  Respiratory: Negative for  cough and shortness of breath.   Cardiovascular: Negative for chest pain or palpitations.  Gastrointestinal: Negative for abdominal pain, no bowel changes.  Musculoskeletal: Negative for gait problem or joint swelling.  Skin: Negative for rash.  Neurological: Negative for dizziness or headache.  No other specific complaints in a complete review of systems (except as listed in HPI above).  Objective  Filed Vitals:   05/23/15 0754  BP: 124/72  Pulse: 81  Temp: 97.6 F (36.4 C)  TempSrc: Oral  Resp: 16  Height: 5\' 10"  (1.778 m)  Weight: 280 lb 11.2 oz  (127.325 kg)  SpO2: 96%    Body mass index is 40.28 kg/(m^2).  Physical Exam  Constitutional: Patient appears well-developed and well-nourished. Obese  No distress.  HEENT: head atraumatic, normocephalic, pupils equal and reactive to light, neck supple, throat within normal limits, thyromegaly, hypertrichosis on her face Cardiovascular: Normal rate, regular rhythm and normal heart sounds.  No murmur heard. No BLE edema. Pulmonary/Chest: Effort normal and breath sounds normal. No respiratory distress. Abdominal: Soft.  There is no tenderness. Psychiatric: Patient has a normal mood and affect. behavior is normal. Judgment and thought content normal.  Recent Results (from the past 2160 hour(s))  Potassium     Status: None   Collection Time: 03/05/15 11:09 AM  Result Value Ref Range   Potassium 3.8 3.5 - 5.1 mmol/L  CBC     Status: Abnormal   Collection Time: 03/05/15 11:09 AM  Result Value Ref Range   WBC 6.3 3.6 - 11.0 K/uL   RBC 4.59 3.80 - 5.20 MIL/uL   Hemoglobin 11.3 (L) 12.0 - 16.0 g/dL   HCT 35.2 35.0 - 47.0 %   MCV 76.7 (L) 80.0 - 100.0 fL   MCH 24.7 (L) 26.0 - 34.0 pg   MCHC 32.2 32.0 - 36.0 g/dL   RDW 14.8 (H) 11.5 - 14.5 %   Platelets 266 150 - 440 K/uL  Pregnancy, urine POC     Status: None   Collection Time: 03/11/15 10:47 AM  Result Value Ref Range   Preg Test, Ur NEGATIVE NEGATIVE    Comment:        THE SENSITIVITY OF THIS METHODOLOGY IS >24 mIU/mL   Surgical pathology     Status: None   Collection Time: 03/11/15  1:53 PM  Result Value Ref Range   SURGICAL PATHOLOGY      Surgical Pathology CASE: [id number] PATIENT: [name] Surgical Pathology Report     SPECIMEN SUBMITTED: A. Uterine fibroid  CLINICAL HISTORY: None provided  PRE-OPERATIVE DIAGNOSIS: Uterine fibroid  POST-OPERATIVE DIAGNOSIS: None provided.     DIAGNOSIS: A. UTERUS; MYOMECTOMY: - LEIOMYOMA, 6.5 CM DIAMETER, 256 GRAMS. - PROLIFERATIVE ENDOMETRIUM, SEE  COMMENT.  Comment: The specimen contains endometrium with a linear extent of at least 1.8 cm, along one aspect of the leiomyoma.   GROSS DESCRIPTION: A. Labeled: uterine fibroid Tissue fragment(s): 1 Size: 256 g, 8.0 x 7.5 x 7.2 cm  Description: previously incised fragment of uterine tissue with an exposed 6.5 x 6.2 x 5.5 cm tan whorled nodule with overlying fibrous tissue and one edge shaggy tan to brown possibly endometrium or serosa.  Representative nodule which includes all surrounding structures submitted in 1-4 cassette(s).   Final Diagnosis performed by Bryan Lemma, MD.  Electronica lly signed 03/13/2015 2:41:04PM    The electronic signature indicates that the named Attending Pathologist has evaluated the specimen  Technical component performed at [facility], [address] [place], [place] [address] Lab: [phone]  Dir: Darrick Penna. Evette Doffing, MD  Professional component performed at [facility], [facility], [place], [place], [place] [address] Lab: [phone] Dir: Dellia Nims. Rubinas, MD    CBC     Status: Abnormal   Collection Time: 03/12/15  4:41 AM  Result Value Ref Range   WBC 11.5 (H) 3.6 - 11.0 K/uL   RBC 3.93 3.80 - 5.20 MIL/uL   Hemoglobin 9.6 (L) 12.0 - 16.0 g/dL   HCT 30.5 (L) 35.0 - 47.0 %   MCV 77.5 (L) 80.0 - 100.0 fL   MCH 24.5 (L) 26.0 - 34.0 pg   MCHC 31.6 (L) 32.0 - 36.0 g/dL   RDW 14.9 (H) 11.5 - 14.5 %   Platelets 271 150 - 440 K/uL    PHQ2/9: Depression screen [facility] 2/9 05/23/2015 10/17/2014 07/24/2014  Decreased Interest 0 0 0  Down, Depressed, Hopeless 0 0 0  PHQ - 2 Score 0 0 0     Fall Risk: Fall Risk  05/23/2015 10/17/2014 07/24/2014  Falls in the past year? No No No      Functional Status Survey: Is the patient deaf or have difficulty hearing?: No Does the patient have difficulty seeing, even when wearing glasses/contacts?: No Does the patient have difficulty concentrating, remembering, or making decisions?:  No Does the patient have difficulty walking or climbing stairs?: No Does the patient have difficulty dressing or bathing?: No Does the patient have difficulty doing errands alone such as visiting a doctor's office or shopping?: No    Assessment & Plan  1. Hypertension, benign  - hydrochlorothiazide (HYDRODIURIL) 12.5 MG tablet; Take 1 tablet (12.5 mg total) by mouth daily.  Dispense: 30 tablet; Refill: 5  2. PCOS (polycystic ovarian syndrome)  - Hemoglobin A1c  3. S/P myomectomy  Doing well   4. Genital herpes  - valACYclovir (VALTREX) 500 MG tablet; TAKE 1 TABLET BY MOUTH DAILY FOR PREVENTION AND THREE TIMES DAILY FOR OUTBREAKS  Dispense: 40 tablet; Refill: 5  5. Thyromegaly  - US Soft Tissue Head/Neck; Future - TSH  6. Decreased thyroid stimulating hormone (TSH) level  Check TSH  7. Obesity, Class III, BMI 40-49.9 (morbid obesity) ([place])  Discussed with the patient the risk posed by an increased BMI. Discussed importance of portion control, calorie counting and at least 150 minutes of physical activity weekly. Avoid sweet beverages and drink more water. Eat at least 6 servings of fruit and vegetables daily   8. Vitamin D deficiency  - VITAMIN D 25 Hydroxy (Vit-D Deficiency, Fractures)  9. Miscarriage within last 12 months  Gets sad at times, but doing well overall  10. Lipid screening  - Lipid panel  11. Iron deficiency anemia  - CBC with Differential/Platelet - Ferritin   12. History of motion sickness  - scopolamine (TRANSDERM-SCOP) 1 MG/3DAYS; Place 1 patch (1.5 mg total) onto the skin every 3 (three) days.  Dispense: 3 patch; Refill: 12  Going on a cruise and would like to take Transderm-scp

## 2015-07-05 ENCOUNTER — Ambulatory Visit
Admission: RE | Admit: 2015-07-05 | Discharge: 2015-07-05 | Disposition: A | Discharge status: Home / Self Care | Payer: Managed Care, Other (non HMO) | Source: Ambulatory Visit | Attending: Family Medicine | Admitting: Family Medicine

## 2015-07-05 DIAGNOSIS — E042 Nontoxic multinodular goiter: Secondary | ICD-10-CM | POA: Diagnosis not present

## 2015-07-05 DIAGNOSIS — E049 Nontoxic goiter, unspecified: Secondary | ICD-10-CM | POA: Diagnosis present

## 2015-07-05 DIAGNOSIS — E010 Iodine-deficiency related diffuse (endemic) goiter: Secondary | ICD-10-CM

## 2015-07-07 ENCOUNTER — Other Ambulatory Visit: Payer: Self-pay | Admitting: Family Medicine

## 2015-07-07 DIAGNOSIS — E041 Nontoxic single thyroid nodule: Secondary | ICD-10-CM | POA: Insufficient documentation

## 2015-07-20 HISTORY — PX: BIOPSY THYROID: PRO38

## 2015-09-20 ENCOUNTER — Encounter: Payer: Managed Care, Other (non HMO) | Admitting: Family Medicine

## 2015-11-19 ENCOUNTER — Encounter: Payer: Managed Care, Other (non HMO) | Admitting: Family Medicine

## 2015-11-25 ENCOUNTER — Ambulatory Visit (INDEPENDENT_AMBULATORY_CARE_PROVIDER_SITE_OTHER): Payer: Managed Care, Other (non HMO) | Admitting: Family Medicine

## 2015-11-25 DIAGNOSIS — I10 Essential (primary) hypertension: Secondary | ICD-10-CM

## 2015-11-28 NOTE — Progress Notes (Signed)
Pt was not seen.

## 2015-12-11 ENCOUNTER — Encounter: Payer: Self-pay | Admitting: Family Medicine

## 2015-12-11 ENCOUNTER — Ambulatory Visit (INDEPENDENT_AMBULATORY_CARE_PROVIDER_SITE_OTHER): Payer: Managed Care, Other (non HMO) | Admitting: Family Medicine

## 2015-12-11 DIAGNOSIS — W57XXXA Bitten or stung by nonvenomous insect and other nonvenomous arthropods, initial encounter: Secondary | ICD-10-CM

## 2015-12-11 DIAGNOSIS — S70362A Insect bite (nonvenomous), left thigh, initial encounter: Secondary | ICD-10-CM

## 2015-12-11 MED ORDER — CLINDAMYCIN HCL 300 MG PO CAPS: 300.0000 mg | ORAL_CAPSULE | Freq: Three times a day (TID) | ORAL | 0 refills | Status: DC

## 2015-12-11 NOTE — Progress Notes (Signed)
Name: Michelle Ware   MRN: [id number]    DOB: 1980-05-15   Date:12/11/2015       Progress Note  Subjective  Chief Complaint  Chief Complaint  Patient presents with  . Insect Bite    on left thigh, painful   This patient is usually seen by Dr. Ancil Boozer, new to me HPI  Pt. Presents for evaluation of a skin lesion, present on the left thigh, first noticed 3 days ago, appears as a raised area with redness around it, initially painful to touch but that is getting better. No fevers or chills, has not used any medication for symptoms.    Past Medical History:  Diagnosis Date  . Dermatophytosis of foot   . Goiter   . Gross hematuria   . Herpes, genital   . Hypertension   . Low grade squamous intraepithelial lesion (LGSIL)   . Morbid obesity with BMI of 40.0-44.9, adult ([place])   . Polycystic ovarian syndrome   . Polycystic ovaries   . Vitamin D deficiency     Past Surgical History:  Procedure Laterality Date  . LAPAROSCOPIC GELPORT ASSISTED MYOMECTOMY N/A 03/11/2015   Procedure: LAPAROSCOPIC  MYOMECTOMY--attempted;  Surgeon: Rubie Maid, MD;  Location: [facility];  Service: Gynecology;  Laterality: N/A;  . LAPAROTOMY N/A 03/11/2015   Procedure: LAPAROTOMY--MYOMECTOMY;  Surgeon: Rubie Maid, MD;  Location: [facility];  Service: Gynecology;  Laterality: N/A;  . MOUTH SURGERY  1996    Family History  Problem Relation Age of Onset  . Thyroid disease Mother   . Hypertension Mother     Social History   Social History  . Marital status: Single    Spouse name: N/A  . Number of children: N/A  . Years of education: N/A   Occupational History  . Not on file.   Social History Main Topics  . Smoking status: Never Smoker  . Smokeless tobacco: Never Used  . Alcohol use 0.0 oz/week     Comment: 2/week  . Drug use: No  . Sexual activity: Yes    Partners: Male   Other Topics Concern  . Not on file   Social History Narrative  . No narrative on file     Current Outpatient  Prescriptions:  .  cholecalciferol (VITAMIN D) 1000 units tablet, Take 1,000 Units by mouth daily., Disp: , Rfl:  .  hydrochlorothiazide (HYDRODIURIL) 12.5 MG tablet, Take 1 tablet (12.5 mg total) by mouth daily., Disp: 30 tablet, Rfl: 5 .  scopolamine (TRANSDERM-SCOP) 1 MG/3DAYS, Place 1 patch (1.5 mg total) onto the skin every 3 (three) days., Disp: 3 patch, Rfl: 12 .  valACYclovir (VALTREX) 500 MG tablet, TAKE 1 TABLET BY MOUTH DAILY FOR PREVENTION AND THREE TIMES DAILY FOR OUTBREAKS, Disp: 40 tablet, Rfl: 5  Allergies  Allergen Reactions  . Hydrocodone Itching     ROS  For a complete list of ROS, please see history of present illness  Objective  Vitals:   12/11/15 0751  BP: 126/72  Pulse: 85  Resp: 16  Temp: 98.4 F (36.9 C)  TempSrc: Oral  SpO2: 96%  Weight: 295 lb 3.2 oz (133.9 kg)  Height: 5\' 10"  (1.778 m)    Physical Exam  Constitutional: She is oriented to person, place, and time and well-developed, well-nourished, and in no distress.  Neurological: She is alert and oriented to person, place, and time.  Skin:     Left postero-medial thigh with a raised non tender non erythematous lesion with underlying nodularity likely consistent  with an insect bite, no visible drainage.  Psychiatric: Mood, memory, affect and judgment normal.  Nursing note and vitals reviewed.      Assessment & Plan  1. Insect bite of left thigh, initial encounter Solving abscess, start on clindamycin, recommended using warm compresses. Will contact us if no improvement. - clindamycin (CLEOCIN) 300 MG capsule; Take 1 capsule (300 mg total) by mouth 3 (three) times daily.  Dispense: 30 capsule; Refill: 0   Eleana Tocco [facility] 12/11/2015 8:07 AM

## 2015-12-20 ENCOUNTER — Other Ambulatory Visit: Payer: Self-pay | Admitting: Family Medicine

## 2015-12-20 ENCOUNTER — Ambulatory Visit (INDEPENDENT_AMBULATORY_CARE_PROVIDER_SITE_OTHER): Payer: Managed Care, Other (non HMO) | Admitting: Family Medicine

## 2015-12-20 ENCOUNTER — Encounter: Payer: Self-pay | Admitting: Family Medicine

## 2015-12-20 VITALS — BP 136/68 | HR 88 | Temp 97.9°F | Resp 18 | Ht 70.5 in | Wt 290.6 lb

## 2015-12-20 DIAGNOSIS — L689 Hypertrichosis, unspecified: Secondary | ICD-10-CM

## 2015-12-20 DIAGNOSIS — Z113 Encounter for screening for infections with a predominantly sexual mode of transmission: Secondary | ICD-10-CM

## 2015-12-20 DIAGNOSIS — E559 Vitamin D deficiency, unspecified: Secondary | ICD-10-CM | POA: Diagnosis not present

## 2015-12-20 DIAGNOSIS — Z13220 Encounter for screening for lipoid disorders: Secondary | ICD-10-CM | POA: Diagnosis not present

## 2015-12-20 DIAGNOSIS — Z131 Encounter for screening for diabetes mellitus: Secondary | ICD-10-CM | POA: Diagnosis not present

## 2015-12-20 DIAGNOSIS — Z0001 Encounter for general adult medical examination with abnormal findings: Secondary | ICD-10-CM | POA: Diagnosis not present

## 2015-12-20 DIAGNOSIS — E8881 Metabolic syndrome: Secondary | ICD-10-CM

## 2015-12-20 DIAGNOSIS — Z6841 Body Mass Index (BMI) 40.0 and over, adult: Secondary | ICD-10-CM | POA: Diagnosis not present

## 2015-12-20 DIAGNOSIS — A6000 Herpesviral infection of urogenital system, unspecified: Secondary | ICD-10-CM

## 2015-12-20 DIAGNOSIS — I10 Essential (primary) hypertension: Secondary | ICD-10-CM

## 2015-12-20 DIAGNOSIS — Z124 Encounter for screening for malignant neoplasm of cervix: Secondary | ICD-10-CM

## 2015-12-20 DIAGNOSIS — Z01419 Encounter for gynecological examination (general) (routine) without abnormal findings: Secondary | ICD-10-CM

## 2015-12-20 DIAGNOSIS — E282 Polycystic ovarian syndrome: Secondary | ICD-10-CM

## 2015-12-20 DIAGNOSIS — R7303 Prediabetes: Secondary | ICD-10-CM

## 2015-12-20 DIAGNOSIS — E010 Iodine-deficiency related diffuse (endemic) goiter: Secondary | ICD-10-CM

## 2015-12-20 DIAGNOSIS — E66813 Obesity, class 3: Secondary | ICD-10-CM

## 2015-12-20 LAB — HM PAP SMEAR: HM Pap smear: NEGATIVE

## 2015-12-20 MED ORDER — LIRAGLUTIDE 18 MG/3ML ~~LOC~~ SOPN: 0.6000 mg | PEN_INJECTOR | Freq: Every day | SUBCUTANEOUS | 2 refills | Status: DC

## 2015-12-20 MED ORDER — INSULIN PEN NEEDLE 30G X 8 MM MISC: 1.0000 | 0 refills | Status: DC | PRN

## 2015-12-20 MED ORDER — VALACYCLOVIR HCL 500 MG PO TABS: ORAL_TABLET | ORAL | 5 refills | Status: DC

## 2015-12-20 MED ORDER — HYDROCHLOROTHIAZIDE 12.5 MG PO TABS: 12.5000 mg | ORAL_TABLET | Freq: Every day | ORAL | 5 refills | Status: DC

## 2015-12-20 NOTE — Progress Notes (Signed)
Name: Michelle Ware   MRN: [id number]    DOB: 1980-01-25   Date:12/20/2015       Progress Note  Subjective  Chief Complaint  Chief Complaint  Patient presents with  . Annual Exam    HPI  Well Woman: she is sexually active, no vaginal problems or discharge, no recent herpes outbreak and boyfriend is aware she has herpes. No bladder problems.   Thyroid goiter: seeing Michelle Ware, biopsy negative, still has goiter, but no dysphagia. She would like to switch to another provider   HTN: taking medication daily, no chest pain, palpitation, no side effects of medication  Obesity: she gained weight from May until her last visit with Michelle Ware went from 280 lbs to 295 lb, however since last week she has changed her diet, eating oatmeal for breakfast, avoiding sodas, red meat and pork and cooking more at home and has lost 5 lbs since last visit.  S/p Myomectomy in Feb 2017, doing well, cycles are back to normal. She had it done after miscarriage last fall. She lost blood during the procedure and was anemic, she has stopped taking supplements because of constipation.   Patient Active Problem List   Diagnosis Date Noted  . Thyroid nodule 07/07/2015  . Postoperative anemia due to acute blood loss 04/18/2015  . S/P myomectomy 03/11/2015  . Essential hypertension 11/28/2014  . Obesity, Class III, BMI 40-49.9 (morbid obesity) ([place]) 11/28/2014  . Decreased thyroid stimulating hormone (TSH) level 07/24/2014  . Hypertrichosis 07/24/2014  . Vitamin D deficiency 07/24/2014  . Thyromegaly 07/24/2014  . PCOS (polycystic ovarian syndrome) 07/24/2014  . History of abnormal cervical Pap smear 11/24/2011  . Genital herpes 11/29/2008    Past Surgical History:  Procedure Laterality Date  . BIOPSY THYROID  07/2015   Michelle Ware  . LAPAROSCOPIC GELPORT ASSISTED MYOMECTOMY N/A 03/11/2015   Procedure: LAPAROSCOPIC  MYOMECTOMY--attempted;  Surgeon: Michelle Maid, MD;  Location: [facility] ORS;  Service: Gynecology;   Laterality: N/A;  . LAPAROTOMY N/A 03/11/2015   Procedure: LAPAROTOMY--MYOMECTOMY;  Surgeon: Michelle Maid, MD;  Location: [facility] OR[facility];  Service: Gynecology;  Laterality: N/A;  . MOUTH SURGERY  1996    Family History  Problem Relation Age of Onset  . Thyroid disease Mother   . Hypertension Mother     Social History   Social History  . Marital status: Single    Spouse name: N/A  . Number of children: 0  . Years of education: N/A   Occupational History  . 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 Smoking status: Never Smoker  . Smokeless tobacco: Never Used  . Alcohol use 0.0 oz/week     Comment: 2/week  . Drug use: No  . Sexual activity: Yes    Partners: Male   Other Topics Concern  . Not on file   Social History Narrative   Lives by herself, dating Michelle Ware since 2015.      Current Outpatient Prescriptions:  .  cholecalciferol (VITAMIN D) 1000 units tablet, Take 1,000 Units by mouth daily., Disp: , Rfl:  .  hydrochlorothiazide (HYDRODIURIL) 12.5 MG tablet, Take 1 tablet (12.5 mg total) by mouth daily., Disp: 30 tablet, Rfl: 5 .  valACYclovir (VALTREX) 500 MG tablet, TAKE 1 TABLET BY MOUTH DAILY FOR PREVENTION AND THREE TIMES DAILY FOR OUTBREAKS, Disp: 40 tablet, Rfl: 5  Allergies  Allergen Reactions  . Hydrocodone Itching     ROS  Constitutional:  Negative for fever or weight change.  Respiratory: Negative for cough and shortness of breath.   Cardiovascular: Negative for chest pain or palpitations.  Gastrointestinal: Negative for abdominal pain, no bowel changes.  Musculoskeletal: Negative for gait problem or joint swelling.  Skin: Negative for rash.  Neurological: Negative for dizziness or headache.  No other specific complaints in a complete review of systems (except as listed in HPI above).  Objective  Vitals:   12/20/15 1428  BP: 136/68  Pulse: 88  Resp: 18  Temp: 97.9 F (36.6 C)  TempSrc: Oral  SpO2: 99%   Weight: 290 lb 9.6 oz (131.8 kg)  Height: 5' 10.5" (1.791 m)    Body mass index is 41.11 kg/m.  Physical Exam  Constitutional: Patient appears well-developed and obeseNo distress.  HENT: Head: Normocephalic and atraumatic. Ears: B TMs ok, no erythema or effusion; Nose: Nose normal. Mouth/Throat: Oropharynx is clear and moist. No oropharyngeal exudate.  Eyes: Conjunctivae and EOM are normal. Pupils are equal, round, and reactive to light. No scleral icterus.  Neck: Normal range of motion. Neck supple. No JVD present. No thyromegaly present.  Cardiovascular: Normal rate, regular rhythm and normal heart sounds.  No murmur heard. No BLE edema. Pulmonary/Chest: Effort normal and breath sounds normal. No respiratory distress. Abdominal: Soft. Bowel sounds are normal, no distension. There is no tenderness. no masses Breast: no lumps or masses, no nipple discharge or rashes FEMALE GENITALIA:  External genitalia normal External urethra normal Vaginal vault normal without discharge or lesions Cervix normal without discharge or lesions Bimanual exam normal without masses RECTAL: not done Musculoskeletal: Normal range of motion, no joint effusions. No gross deformities Neurological: he is alert and oriented to person, place, and time. No cranial nerve deficit. Coordination, balance, strength, speech and gait are normal.  Skin: Skin is warm and dry. No rash noted. No erythema. Hirsutism Psychiatric: Patient has a normal mood and affect. behavior is normal. Judgment and thought content normal.  PHQ2/9: Depression screen [facility] 2/9 12/20/2015 05/23/2015 10/17/2014 07/24/2014  Decreased Interest 0 0 0 0  Down, Depressed, Hopeless 0 0 0 0  PHQ - 2 Score 0 0 0 0     Fall Risk: Fall Risk  12/20/2015 05/23/2015 10/17/2014 07/24/2014  Falls in the past year? No No No No     Functional Status Survey: Is the patient deaf or have difficulty hearing?: No Does the patient have difficulty seeing, even when  wearing glasses/contacts?: No Does the patient have difficulty concentrating, remembering, or making decisions?: No Does the patient have difficulty walking or climbing stairs?: No Does the patient have difficulty dressing or bathing?: No Does the patient have difficulty doing errands alone such as visiting a doctor's office or shopping?: No   Assessment & Plan  1. Well woman exam  Discussed importance of 150 minutes of physical activity weekly, eat two servings of fish weekly, eat one serving of tree nuts ( cashews, pistachios, pecans, almonds.Marland Kitchen) every other day, eat 6 servings of fruit/vegetables daily and drink plenty of water and avoid sweet beverages.  - CBC with Differential/Platelet - COMPLETE METABOLIC PANEL WITH GFR - VITAMIN D 25 Hydroxy (Vit-D Deficiency, Fractures) - Lipid panel - HIV antibody - RPR  2. Cervical cancer screening  - Pap IG, CT/NG NAA, and HPV (high risk)  3. Hypertension, benign  - CBC with Differential/Platelet - COMPLETE METABOLIC PANEL WITH GFR - hydrochlorothiazide (HYDRODIURIL) 12.5 MG tablet; Take 1 tablet (12.5 mg total) by mouth daily.  Dispense: 30 tablet;  Refill: 5  4. PCOS (polycystic ovarian syndrome)  Cycles are regular, we will check for insulin resistance  5. Thyromegaly  - Ambulatory referral to Endocrinology - she would like to switch to Dr. Eddie Dibbles, biopsy done and negative back in 07/2015  6. Lipid screening  - Lipid panel  7. Vitamin D deficiency  - VITAMIN D 25 Hydroxy (Vit-D Deficiency, Fractures)  8. Obesity, Class III, BMI 40-49.9 (morbid obesity) ([facility])  - Hemoglobin A1c - Insulin, Fasting  9. Routine screening for STI (sexually transmitted infection)  - HIV antibody - RPR - Pap IG, CT/NG NAA, and HPV (high risk)  10. Diabetes mellitus screening  - Hemoglobin A1c - Insulin, Fasting  11. Recurrent genital herpes  - valACYclovir (VALTREX) 500 MG tablet; TAKE 1 TABLET BY MOUTH DAILY FOR PREVENTION AND THREE  TIMES DAILY FOR OUTBREAKS  Dispense: 40 tablet; Refill: 5  12. Metabolic syndrome  - liraglutide (VICTOZA) 18 MG/3ML SOPN; Inject 0.1-0.3 mLs (0.6-1.8 mg total) into the skin daily.  Dispense: 9 mL; Refill: 2 - Insulin Pen Needle (NOVOFINE) 30G X 8 MM MISC; Inject 10 each into the skin as needed.  Dispense: 100 each; Refill: 0  13. Prediabetes  Discussed risk , no family history of thyroid cancer - liraglutide (VICTOZA) 18 MG/3ML SOPN; Inject 0.1-0.3 mLs (0.6-1.8 mg total) into the skin daily.  Dispense: 9 mL; Refill: 2 - Insulin Pen Needle (NOVOFINE) 30G X 8 MM MISC; Inject 10 each into the skin as needed.  Dispense: 100 each; Refill: 0

## 2015-12-25 LAB — PAP IG, CT-NG NAA, HPV HIGH-RISK
Chlamydia Probe Amp: NOT DETECTED
GC Probe Amp: NOT DETECTED
HPV DNA High Risk: NOT DETECTED

## 2016-02-18 ENCOUNTER — Encounter: Payer: Self-pay | Admitting: Family Medicine

## 2016-02-18 ENCOUNTER — Ambulatory Visit (INDEPENDENT_AMBULATORY_CARE_PROVIDER_SITE_OTHER): Payer: Managed Care, Other (non HMO) | Admitting: Family Medicine

## 2016-02-18 VITALS — BP 138/84 | HR 107 | Temp 98.0°F | Resp 18 | Ht 71.0 in | Wt 301.5 lb

## 2016-02-18 DIAGNOSIS — I10 Essential (primary) hypertension: Secondary | ICD-10-CM

## 2016-02-18 DIAGNOSIS — Z3201 Encounter for pregnancy test, result positive: Secondary | ICD-10-CM

## 2016-02-18 LAB — POCT URINE PREGNANCY: PREG TEST UR: POSITIVE — AB

## 2016-02-18 MED ORDER — METHYLDOPA 250 MG PO TABS: 250.0000 mg | ORAL_TABLET | Freq: Three times a day (TID) | ORAL | 1 refills | Status: DC

## 2016-02-18 NOTE — Progress Notes (Signed)
Name: Michelle Ware   MRN: [id number]    DOB: 03-Dec-1980   Date:02/18/2016       Progress Note  Subjective  Chief Complaint  Chief Complaint  Patient presents with  . Possible Pregnancy    positive  . Obesity    patient is concerned about her weight. she stated that she has never weighed this much before.  . Medication Management    patient needs to switch BP meds due to (+) preg test    HPI  Positive pregnancy test: LMP 01/13/2016, positive pregnancy test 4 days ago at home and also positive today in our office, she has noticed breast tenderness, and feels very tired, no morning sickness. She stopped taking Victoza and HCTZ when she found out she was pregnant. EDD 10/19/2016. She has seen Dr. Marcelline Mates for myomectomy and would like to go back to [facility]  Obesity: discussed eating healthy but not to go on a very low calorie diet because she is pregnant.   Patient Active Problem List   Diagnosis Date Noted  . Thyroid nodule 07/07/2015  . Postoperative anemia due to acute blood loss 04/18/2015  . S/P myomectomy 03/11/2015  . Essential hypertension 11/28/2014  . Obesity, Class III, BMI 40-49.9 (morbid obesity) ([place]) 11/28/2014  . Decreased thyroid stimulating hormone (TSH) level 07/24/2014  . Hypertrichosis 07/24/2014  . Vitamin D deficiency 07/24/2014  . Thyromegaly 07/24/2014  . PCOS (polycystic ovarian syndrome) 07/24/2014  . History of abnormal cervical Pap smear 11/24/2011  . Genital herpes 11/29/2008    Past Surgical History:  Procedure Laterality Date  . BIOPSY THYROID  07/2015   Dr. Gabriel Carina  . LAPAROSCOPIC GELPORT ASSISTED MYOMECTOMY N/A 03/11/2015   Procedure: LAPAROSCOPIC  MYOMECTOMY--attempted;  Surgeon: Rubie Maid, MD;  Location: [facility] ORS;  Service: Gynecology;  Laterality: N/A;  . LAPAROTOMY N/A 03/11/2015   Procedure: LAPAROTOMY--MYOMECTOMY;  Surgeon: Rubie Maid, MD;  Location: [facility] ORS;  Service: Gynecology;  Laterality: N/A;  . MOUTH SURGERY  1996    Family  History  Problem Relation Age of Onset  . Thyroid disease Mother   . Hypertension Mother     Social History   Social History  . Marital status: Single    Spouse name: N/A  . Number of children: 0  . Years of education: N/A   Occupational History  . 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 Smoking status: Never Smoker  . Smokeless tobacco: Never Used  . Alcohol use 0.0 oz/week     Comment: 2/week  . Drug use: No  . Sexual activity: Yes    Partners: Male    Birth control/ protection: None   Other Topics Concern  . Not on file   Social History Narrative   Lives by herself, dating Carloyn Manner since 2015.      Current Outpatient Prescriptions:  .  cholecalciferol (VITAMIN D) 1000 units tablet, Take 1,000 Units by mouth daily., Disp: , Rfl:  .  methyldopa (ALDOMET) 250 MG tablet, Take 1 tablet (250 mg total) by mouth 3 (three) times daily., Disp: 90 tablet, Rfl: 1 .  valACYclovir (VALTREX) 500 MG tablet, TAKE 1 TABLET BY MOUTH DAILY FOR PREVENTION AND THREE TIMES DAILY FOR OUTBREAKS (Patient not taking: Reported on 02/18/2016), Disp: 40 tablet, Rfl: 5  Allergies  Allergen Reactions  . Hydrocodone Itching     ROS  Constitutional: Negative for fever, positive for  weight change.  Respiratory: Negative for cough  and shortness of breath.   Cardiovascular: Negative for chest pain or palpitations.  Gastrointestinal: Negative for abdominal pain, no bowel changes.  Musculoskeletal: Negative for gait problem or joint swelling.  Skin: Negative for rash.  Neurological: Negative for dizziness or headache.  No other specific complaints in a complete review of systems (except as listed in HPI above).  Objective  Vitals:   02/18/16 1538  BP: 138/84  Pulse: (!) 107  Resp: 18  Temp: 98 F (36.7 C)  TempSrc: Oral  SpO2: 98%  Weight: (!) 301 lb 8 oz (136.8 kg)  Height: 5\' 11"  (1.803 m)    Body mass index is 42.05 kg/m.  Physical  Exam  Constitutional: Patient appears well-developed and well-nourished. Obese  No distress.  HEENT: head atraumatic, normocephalic, pupils equal and reactive to light, neck supple, throat within normal limits Cardiovascular: Normal rate, regular rhythm and normal heart sounds.  No murmur heard. No BLE edema. Pulmonary/Chest: Effort normal and breath sounds normal. No respiratory distress. Abdominal: Soft.  There is no tenderness. Psychiatric: Patient has a normal mood and affect. behavior is normal. Judgment and thought content normal.  Recent Results (from the past 2160 hour(s))  Pap IG, CT/NG NAA, and HPV (high risk)     Status: None   Collection Time: 12/20/15  3:12 PM  Result Value Ref Range   HPV DNA High Risk Not Detected     Comment: HIGH RISK HPV types (16,18,31,33,35,39,45,51,52,56,58,59,66,68) were not detected. Other HPV types which cause anogenital lesions may be present. The significance of the other types of HPV in malignant  processes has not been established.                  ** Normal Reference Range: Not Detected **      HPV High Risk testing performed using the APTIMA HPV mRNA Assay.      Specimen adequacy:      Comment: SATISFACTORY.  Endocervical/transformation zone component present.   FINAL DIAGNOSIS:      Comment: - NEGATIVE FOR INTRAEPITHELIAL LESIONS OR MALIGNANCY.    COMMENTS:      Comment: This Pap test has been evaluated with computer assisted technology.   Cytotechnologist:      Comment: JLT, BS CT(ASCP)   QC Cytotechnologist:      Comment: EW, BA CT(ASCP)   Chlamydia Probe Amp NOT DETECTED     Comment:                    **Normal Reference Range: NOT DETECTED**   This test was performed using the APTIMA COMBO2 Assay ([employer]   The analytical performance characteristics of this assay, when used to test SurePath specimens have been determined by [facility]      GC Probe Amp NOT DETECTED     Comment:                     **Normal Reference Range: NOT DETECTED**   This test was performed using the APTIMA COMBO2 Assay ([place].).   The analytical performance characteristics of this assay, when used to test SurePath specimens have been determined by [employer]   *  The Pap is a screening test for cervical cancer. It is not a  diagnostic test and is subject to false negative and false positive  results. It is most reliable when a satisfactory sample, regularly  obtained, is submitted with relevant clinical findings and history,  and when the Pap result is  evaluated along with historic and current  clinical information.   POCT urine pregnancy     Status: Abnormal   Collection Time: 02/18/16  3:42 PM  Result Value Ref Range   Preg Test, Ur Positive (A) Negative     PHQ2/9: Depression screen [facility] 2/9 12/20/2015 05/23/2015 10/17/2014 07/24/2014  Decreased Interest 0 0 0 0  Down, Depressed, Hopeless 0 0 0 0  PHQ - 2 Score 0 0 0 0     Fall Risk: Fall Risk  12/20/2015 05/23/2015 10/17/2014 07/24/2014  Falls in the past year? No No No No     Assessment & Plan  1. Positive pregnancy test  - POCT urine pregnancy ,  - methyldopa (ALDOMET) 250 MG tablet; Take 1 tablet (250 mg total) by mouth 3 (three) times daily.  Dispense: 90 tablet; Refill: 1 - Ambulatory referral to Obstetrics / Gynecology  2. Hypertension, benign  - methyldopa (ALDOMET) 250 MG tablet; Take 1 tablet (250 mg total) by mouth 3 (three) times daily.  Dispense: 90 tablet; Refill: 1

## 2016-02-19 ENCOUNTER — Encounter: Payer: Self-pay | Admitting: Family Medicine

## 2016-02-25 ENCOUNTER — Ambulatory Visit (INDEPENDENT_AMBULATORY_CARE_PROVIDER_SITE_OTHER): Payer: Managed Care, Other (non HMO) | Admitting: Family Medicine

## 2016-02-25 ENCOUNTER — Encounter: Payer: Self-pay | Admitting: Family Medicine

## 2016-02-25 VITALS — BP 110/50 | HR 86 | Temp 98.7°F | Resp 16 | Ht 71.0 in | Wt 302.1 lb

## 2016-02-25 DIAGNOSIS — R0602 Shortness of breath: Secondary | ICD-10-CM | POA: Diagnosis not present

## 2016-02-25 DIAGNOSIS — Z3201 Encounter for pregnancy test, result positive: Secondary | ICD-10-CM

## 2016-02-25 DIAGNOSIS — I10 Essential (primary) hypertension: Secondary | ICD-10-CM | POA: Diagnosis not present

## 2016-02-25 NOTE — Progress Notes (Signed)
Name: Michelle Ware   MRN: [id number]    DOB: 07-10-1980   Date:02/25/2016       Progress Note  Subjective  Chief Complaint  Chief Complaint  Patient presents with  . Edema    Onset-Last Monday, patient recently found out she was pregnant and having bilateral ankle edema.     HPI  HTN/pregnancy: she was seen one week ago, and that evening she started to feel concerned about ankle edema, SOB with mild activity, and fatigue. She states the swelling has improved, she still has some SOB with activity, weight had been stable. She denies orthopnea. She has palpitation very seldom ( Even before pregnancy ). She is taking Methyldopa since last week and bp is towards low end of normal but she denies dizziness.    Patient Active Problem List   Diagnosis Date Noted  . Thyroid nodule 07/07/2015  . Postoperative anemia due to acute blood loss 04/18/2015  . S/P myomectomy 03/11/2015  . Essential hypertension 11/28/2014  . Obesity, Class III, BMI 40-49.9 (morbid obesity) ([place]) 11/28/2014  . Decreased thyroid stimulating hormone (TSH) level 07/24/2014  . Hypertrichosis 07/24/2014  . Vitamin D deficiency 07/24/2014  . Thyromegaly 07/24/2014  . PCOS (polycystic ovarian syndrome) 07/24/2014  . History of abnormal cervical Pap smear 11/24/2011  . Genital herpes 11/29/2008    Past Surgical History:  Procedure Laterality Date  . BIOPSY THYROID  07/2015   Dr. Gabriel Carina  . LAPAROSCOPIC GELPORT ASSISTED MYOMECTOMY N/A 03/11/2015   Procedure: LAPAROSCOPIC  MYOMECTOMY--attempted;  Surgeon: Rubie Maid, MD;  Location: [facility] ORS;  Service: Gynecology;  Laterality: N/A;  . LAPAROTOMY N/A 03/11/2015   Procedure: LAPAROTOMY--MYOMECTOMY;  Surgeon: Rubie Maid, MD;  Location: [facility] ORS;  Service: Gynecology;  Laterality: N/A;  . MOUTH SURGERY  1996    Family History  Problem Relation Age of Onset  . Thyroid disease Mother   . Hypertension Mother     Social History   Social History  . Marital status: Single     Spouse name: N/A  . Number of children: 0  . Years of education: N/A   Occupational History  . 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 Smoking status: Never Smoker  . Smokeless tobacco: Never Used  . Alcohol use No  . Drug use: No  . Sexual activity: Yes    Partners: Male    Birth control/ protection: None   Other Topics Concern  . Not on file   Social History Narrative   Lives by herself, dating Carloyn Manner since 2015.      Current Outpatient Prescriptions:  .  methyldopa (ALDOMET) 250 MG tablet, Take 1 tablet (250 mg total) by mouth 3 (three) times daily., Disp: 90 tablet, Rfl: 1 .  Prenatal Multivit-Min-Fe-FA (PRENATAL VITAMINS PO), Take 1 tablet by mouth daily., Disp: , Rfl:  .  valACYclovir (VALTREX) 500 MG tablet, TAKE 1 TABLET BY MOUTH DAILY FOR PREVENTION AND THREE TIMES DAILY FOR OUTBREAKS, Disp: 40 tablet, Rfl: 5 .  cholecalciferol (VITAMIN D) 1000 units tablet, Take 1,000 Units by mouth daily., Disp: , Rfl:   Allergies  Allergen Reactions  . Hydrocodone Itching     ROS  Ten systems reviewed and is negative except as mentioned in HPI   Objective  Vitals:   02/25/16 1543  BP: (!) 110/50  Pulse: 86  Resp: 16  Temp: 98.7 F (37.1 C)  TempSrc: Oral  SpO2: 98%  Weight: Marland Kitchen)  302 lb 1.6 oz (137 kg)  Height: 5\' 11"  (1.803 m)    Body mass index is 42.13 kg/m.  Physical Exam  Constitutional: Patient appears well-developed and well-nourished. Obese  No distress.  HEENT: head atraumatic, normocephalic, pupils equal and reactive to light, neck supple, throat within normal limits Cardiovascular: Normal rate, regular rhythm and normal heart sounds.  No murmur heard. Trace BLE ankle  edema. Pulmonary/Chest: Effort normal and breath sounds normal. No respiratory distress. Abdominal: Soft.  There is no tenderness. Psychiatric: Patient has a normal mood and affect. behavior is normal. Judgment and thought content  normal.  Recent Results (from the past 2160 hour(s))  Pap IG, CT/NG NAA, and HPV (high risk)     Status: None   Collection Time: 12/20/15  3:12 PM  Result Value Ref Range   HPV DNA High Risk Not Detected     Comment: HIGH RISK HPV types (16,18,31,33,35,39,45,51,52,56,58,59,66,68) were not detected. Other HPV types which cause anogenital lesions may be present. The significance of the other types of HPV in malignant  processes has not been established.                  ** Normal Reference Range: Not Detected **      HPV High Risk testing performed using the APTIMA HPV mRNA Assay.      Specimen adequacy:      Comment: SATISFACTORY.  Endocervical/transformation zone component present.   FINAL DIAGNOSIS:      Comment: - NEGATIVE FOR INTRAEPITHELIAL LESIONS OR MALIGNANCY.    COMMENTS:      Comment: This Pap test has been evaluated with computer assisted technology.   Cytotechnologist:      Comment: JLT, BS CT(ASCP)   QC Cytotechnologist:      Comment: EW, BA CT(ASCP)   Chlamydia Probe Amp NOT DETECTED     Comment:                    **Normal Reference Range: NOT DETECTED**   This test was performed using the APTIMA COMBO2 Assay ([employer]   The analytical performance characteristics of this assay, when used to test SurePath specimens have been determined by [facility]      GC Probe Amp NOT DETECTED     Comment:                    **Normal Reference Range: NOT DETECTED**   This test was performed using the APTIMA COMBO2 Assay ([place]   The analytical performance characteristics of this assay, when used to test SurePath specimens have been determined by [employer]   *  The Pap is a screening test for cervical cancer. It is not a  diagnostic test and is subject to false negative and false positive  results. It is most reliable when a satisfactory sample, regularly  obtained, is submitted with relevant clinical findings and history,  and  when the Pap result is evaluated along with historic and current  clinical information.   POCT urine pregnancy     Status: Abnormal   Collection Time: 02/18/16  3:42 PM  Result Value Ref Range   Preg Test, Ur Positive (A) Negative     PHQ2/9: Depression screen [facility] 2/9 02/25/2016 12/20/2015 05/23/2015 10/17/2014 07/24/2014  Decreased Interest 0 0 0 0 0  Down, Depressed, Hopeless 0 0 0 0 0  PHQ - 2 Score 0 0 0 0 0    Fall Risk: Fall Risk  02/25/2016  12/20/2015 05/23/2015 10/17/2014 07/24/2014  Falls in the past year? No No No No No   Functional Status Survey: Is the patient deaf or have difficulty hearing?: No Does the patient have difficulty seeing, even when wearing glasses/contacts?: No Does the patient have difficulty concentrating, remembering, or making decisions?: No Does the patient have difficulty walking or climbing stairs?: No Does the patient have difficulty dressing or bathing?: No Does the patient have difficulty doing errands alone such as visiting a doctor's office or shopping?: No    Assessment & Plan  1. Positive pregnancy test  She has a follow up with OB next week   2. Hypertension, benign  Continue medication , she is off diuretic which may have increase chance of ankle edema. Advised to avoid salt.   3. SOB (shortness of breath) on exertion  And ankle edema, that has improved already. Explained fatigue and SOB can happen during pregnancy, but she is early in her pregnancy and denies orthopnea, and swelling is better, unlikely to be cardiomyopathy. Explained that once she goes to OB they should get labs done to find out if anemia is present since that can cause fatigue, she sees Endo for thyroid

## 2016-03-02 ENCOUNTER — Ambulatory Visit (INDEPENDENT_AMBULATORY_CARE_PROVIDER_SITE_OTHER): Payer: Managed Care, Other (non HMO) | Admitting: Obstetrics and Gynecology

## 2016-03-02 VITALS — BP 119/93 | HR 74 | Ht 70.0 in | Wt 300.0 lb

## 2016-03-02 DIAGNOSIS — Z87898 Personal history of other specified conditions: Secondary | ICD-10-CM

## 2016-03-02 DIAGNOSIS — Z1389 Encounter for screening for other disorder: Secondary | ICD-10-CM

## 2016-03-02 DIAGNOSIS — Z8742 Personal history of other diseases of the female genital tract: Secondary | ICD-10-CM

## 2016-03-02 DIAGNOSIS — O09521 Supervision of elderly multigravida, first trimester: Secondary | ICD-10-CM | POA: Insufficient documentation

## 2016-03-02 DIAGNOSIS — A6000 Herpesviral infection of urogenital system, unspecified: Secondary | ICD-10-CM

## 2016-03-02 DIAGNOSIS — I10 Essential (primary) hypertension: Secondary | ICD-10-CM

## 2016-03-02 DIAGNOSIS — O09299 Supervision of pregnancy with other poor reproductive or obstetric history, unspecified trimester: Secondary | ICD-10-CM

## 2016-03-02 DIAGNOSIS — Z113 Encounter for screening for infections with a predominantly sexual mode of transmission: Secondary | ICD-10-CM

## 2016-03-02 DIAGNOSIS — N926 Irregular menstruation, unspecified: Secondary | ICD-10-CM

## 2016-03-02 DIAGNOSIS — R946 Abnormal results of thyroid function studies: Secondary | ICD-10-CM

## 2016-03-02 DIAGNOSIS — Z3481 Encounter for supervision of other normal pregnancy, first trimester: Secondary | ICD-10-CM

## 2016-03-02 DIAGNOSIS — R7989 Other specified abnormal findings of blood chemistry: Secondary | ICD-10-CM

## 2016-03-02 NOTE — Patient Instructions (Signed)
Pregnancy and Zika Virus Disease Introduction Zika virus disease, or Zika, is an illness that can spread to people from mosquitoes that carry the virus. It may also spread from person to person through infected body fluids. Zika first occurred in [place], but recently it has spread to new areas. The virus occurs in tropical climates. The location of Zika continues to change. Most people who become infected with Zika virus do not develop serious illness. However, Zika may cause birth defects in an unborn baby whose mother is infected with the virus. It may also increase the risk of miscarriage. What are the symptoms of Zika virus disease? In many cases, people who have been infected with Zika virus do not develop any symptoms. If symptoms appear, they usually start about a week after the person is infected. Symptoms are usually mild. They may include:  Fever.  Rash.  Red eyes.  Joint pain. How does Zika virus disease spread? The main way that [place] virus spreads is through the bite of a certain type of mosquito. Unlike most types of mosquitos, which bite only at night, the type of mosquito that carries Zika virus bites both at night and during the day. Zika virus can also spread through sexual contact, through a blood transfusion, and from a mother to her baby before or during birth. Once you have had Zika virus disease, it is unlikely that you will get it again. Can I pass Zika to my baby during pregnancy? Yes, Zika can pass from a mother to her baby before or during birth. What problems can Zika cause for my baby? A woman who is infected with Zika virus while pregnant is at risk of having her baby born with a condition in which the brain or head is smaller than expected (microcephaly). Babies who have microcephaly can have developmental delays, seizures, hearing problems, and vision problems. Having Zika virus disease during pregnancy can also increase the risk of miscarriage. How can Zika  virus disease be prevented? There is no vaccine to prevent Zika. The best way to prevent the disease is to avoid infected mosquitoes and avoid exposure to body fluids that can spread the virus. Avoid any possible exposure to [place] by taking the following precautions. For women and their sex partners:  Avoid traveling to high-risk areas. The locations where [place] is being reported change often. To identify high-risk areas, check the CDC travel website: [employer]Chaos.com.ee  If you or your sex partner must travel to a high-risk area, talk with a health care provider before and after traveling.  Take all precautions to avoid mosquito bites if you live in, or travel to, any of the high-risk areas. Insect repellents are safe to use during pregnancy.  Ask your health care provider when it is safe to have sexual contact. For women:  If you are pregnant or trying to become pregnant, avoid sexual contact with persons who may have been exposed to [place] virus, persons who have possible symptoms of Zika, or persons whose history you are unsure about. If you choose to have sexual contact with someone who may have been exposed to [place] virus, use condoms correctly during the entire duration of sexual activity, every time. Do not share sexual devices, as you may be exposed to body fluids.  Ask your health care provider about when it is safe to attempt pregnancy after a possible exposure to [place] virus. What steps should I take to avoid mosquito bites? Take these steps to avoid mosquito bites when you  are in a high-risk area:  Wear loose clothing that covers your arms and legs.  Limit your outdoor activities.  Do not open windows unless they have window screens.  Sleep under mosquito nets.  Use insect repellent. The best insect repellents have:  DEET, picaridin, oil of lemon eucalyptus (OLE), or [id number] in them.  Higher amounts of an active ingredient in them.  Remember that insect repellents  are safe to use during pregnancy.  Do not use OLE on children who are younger than 68 years of age. Do not use insect repellent on babies who are younger than 9 months of age.  Cover your child's stroller with mosquito netting. Make sure the netting fits snugly and that any loose netting does not cover your child's mouth or nose. Do not use a blanket as a mosquito-protection cover.  Do not apply insect repellent underneath clothing.  If you are using sunscreen, apply the sunscreen before applying the insect repellent.  Treat clothing with permethrin. Do not apply permethrin directly to your skin. Follow label directions for safe use.  Get rid of standing water, where mosquitoes may reproduce. Standing water is often found in items such as buckets, bowls, animal food dishes, and flowerpots. When you return from traveling to any high-risk area, continue taking actions to protect yourself against mosquito bites for 3 weeks, even if you show no signs of illness. This will prevent spreading Zika virus to uninfected mosquitoes. What should I know about the sexual transmission of Zika? People can spread Zika to their sexual partners during vaginal, anal, or oral sex, or by sharing sexual devices. Many people with [place] do not develop symptoms, so a person could spread the disease without knowing that they are infected. The greatest risk is to women who are pregnant or who may become pregnant. Zika virus can live longer in semen than it can live in blood. Couples can prevent sexual transmission of the virus by:  Using condoms correctly during the entire duration of sexual activity, every time. This includes vaginal, anal, and oral sex.  Not sharing sexual devices. Sharing increases your risk of being exposed to body fluid from another person.  Avoiding all sexual activity until your health care provider says it is safe. Should I be tested for Zika virus? A sample of your blood can be tested for Zika  virus. A pregnant woman should be tested if she may have been exposed to the virus or if she has symptoms of Zika. She may also have additional tests done during her pregnancy, such ultrasound testing. Talk with your health care provider about which tests are recommended. This information is not intended to replace advice given to you by your health care provider. Make sure you discuss any questions you have with your health care provider. Document Released: 09/26/2014 Document Revised: 06/13/2015 Document Reviewed: 09/19/2014  2017 Elsevier Minor Illnesses and Medications in Pregnancy  Cold/Flu:  Sudafed for congestion- Robitussin (plain) for cough- Tylenol for discomfort.  Please follow the directions on the label.  Try not to take any more than needed.  OTC Saline nasal spray and air humidifier or cool-mist  Vaporizer to sooth nasal irritation and to loosen congestion.  It is also important to increase intake of non carbonated fluids, especially if you have a fever.  Constipation:  Colace-2 capsules at bedtime; Metamucil- follow directions on label; Senokot- 1 tablet at bedtime.  Any one of these medications can be used.  It is also very important to increase  fluids and fruits along with regular exercise.  If problem persists please call the office.  Diarrhea:  Kaopectate as directed on the label.  Eat a bland diet and increase fluids.  Avoid highly seasoned foods.  Headache:  Tylenol 1 or 2 tablets every 3-4 hours as needed  Indigestion:  Maalox, Mylanta, Tums or Rolaids- as directed on label.  Also try to eat small meals and avoid fatty, greasy or spicy foods.  Nausea with or without Vomiting:  Nausea in pregnancy is caused by increased levels of hormones in the body which influence the digestive system and cause irritation when stomach acids accumulate.  Symptoms usually subside after 1st trimester of pregnancy.  Try the following: 1. Keep saltines, graham crackers or dry toast by your bed to  eat upon awakening. 2. Don't let your stomach get empty.  Try to eat 5-6 small meals per day instead of 3 large ones. 3. Avoid greasy fatty or highly seasoned foods.  4. Take OTC Unisom 1 tablet at bed time along with OTC Vitamin B6 25-50 mg 3 times per day.    If nausea continues with vomiting and you are unable to keep down food and fluids you may need a prescription medication.  Please notify your provider.   Sore throat:  Chloraseptic spray, throat lozenges and or plain Tylenol.  Vaginal Yeast Infection:  OTC Monistat for 7 days as directed on label.  If symptoms do not resolve within a week notify provider.  If any of the above problems do not subside with recommended treatment please call the office for further assistance.   Do not take Aspirin, Advil, Motrin or Ibuprofen.  * * OTC= Over the counter Hypertension Hypertension, commonly called high blood pressure, is when the force of blood pumping through your arteries is too strong. Your arteries are the blood vessels that carry blood from your heart throughout your body. A blood pressure reading consists of a higher number over a lower number, such as 110/72. The higher number (systolic) is the pressure inside your arteries when your heart pumps. The lower number (diastolic) is the pressure inside your arteries when your heart relaxes. Ideally you want your blood pressure below 120/80. Hypertension forces your heart to work harder to pump blood. Your arteries may become narrow or stiff. Having untreated or uncontrolled hypertension can cause heart attack, stroke, kidney disease, and other problems. What increases the risk? Some risk factors for high blood pressure are controllable. Others are not. Risk factors you cannot control include:  Race. You may be at higher risk if you are African American.  Age. Risk increases with age.  Gender. Men are at higher risk than women before age 70 years. After age 23, women are at higher risk than  men. Risk factors you can control include:  Not getting enough exercise or physical activity.  Being overweight.  Getting too much fat, sugar, calories, or salt in your diet.  Drinking too much alcohol. What are the signs or symptoms? Hypertension does not usually cause signs or symptoms. Extremely high blood pressure (hypertensive crisis) may cause headache, anxiety, shortness of breath, and nosebleed. How is this diagnosed? To check if you have hypertension, your health care provider will measure your blood pressure while you are seated, with your arm held at the level of your heart. It should be measured at least twice using the same arm. Certain conditions can cause a difference in blood pressure between your right and left arms. A blood pressure  reading that is higher than normal on one occasion does not mean that you need treatment. If it is not clear whether you have high blood pressure, you may be asked to return on a different day to have your blood pressure checked again. Or, you may be asked to monitor your blood pressure at home for 1 or more weeks. How is this treated? Treating high blood pressure includes making lifestyle changes and possibly taking medicine. Living a healthy lifestyle can help lower high blood pressure. You may need to change some of your habits. Lifestyle changes may include:  Following the DASH diet. This diet is high in fruits, vegetables, and whole grains. It is low in salt, red meat, and added sugars.  Keep your sodium intake below 2,300 mg per day.  Getting at least 30-45 minutes of aerobic exercise at least 4 times per week.  Losing weight if necessary.  Not smoking.  Limiting alcoholic beverages.  Learning ways to reduce stress. Your health care provider may prescribe medicine if lifestyle changes are not enough to get your blood pressure under control, and if one of the following is true:  You are 71-51 years of age and your systolic blood  pressure is above 140.  You are 83 years of age or older, and your systolic blood pressure is above 150.  Your diastolic blood pressure is above 90.  You have diabetes, and your systolic blood pressure is over [id number] or your diastolic blood pressure is over 90.  You have kidney disease and your blood pressure is above 140/90.  You have heart disease and your blood pressure is above 140/90. Your personal target blood pressure may vary depending on your medical conditions, your age, and other factors. Follow these instructions at home:  Have your blood pressure rechecked as directed by your health care provider.  Take medicines only as directed by your health care provider. Follow the directions carefully. Blood pressure medicines must be taken as prescribed. The medicine does not work as well when you skip doses. Skipping doses also puts you at risk for problems.  Do not smoke.  Monitor your blood pressure at home as directed by your health care provider. Contact a health care provider if:  You think you are having a reaction to medicines taken.  You have recurrent headaches or feel dizzy.  You have swelling in your ankles.  You have trouble with your vision. Get help right away if:  You develop a severe headache or confusion.  You have unusual weakness, numbness, or feel faint.  You have severe chest or abdominal pain.  You vomit repeatedly.  You have trouble breathing. This information is not intended to replace advice given to you by your health care provider. Make sure you discuss any questions you have with your health care provider. Document Released: 01/05/2005 Document Revised: 06/13/2015 Document Reviewed: 10/28/2012 Elsevier Interactive Patient Education  2017 Elsevier Inc. Hyperemesis Gravidarum Hyperemesis gravidarum is a severe form of nausea and vomiting that happens during pregnancy. Hyperemesis is worse than morning sickness. It may cause you to have nausea  or vomiting all day for many days. It may keep you from eating and drinking enough food and liquids. Hyperemesis usually occurs during the first half (the first 20 weeks) of pregnancy. It often goes away once a woman is in her second half of pregnancy. However, sometimes hyperemesis continues through an entire pregnancy. What are the causes? The cause of this condition is not known. It may be  related to changes in chemicals (hormones) in the body during pregnancy, such as the high level of pregnancy hormone (human chorionic gonadotropin) or the increase in the female sex hormone (estrogen). What are the signs or symptoms? Symptoms of this condition include:  Severe nausea and vomiting.  Nausea that does not go away.  Vomiting that does not allow you to keep any food down.  Weight loss.  Body fluid loss (dehydration).  Having no desire to eat, or not liking food that you have previously enjoyed. How is this diagnosed? This condition may be diagnosed based on:  A physical exam.  Your medical history.  Your symptoms.  Blood tests.  Urine tests. How is this treated? This condition may be managed with medicine. If medicines to do not help relieve nausea and vomiting, you may need to receive fluids through an IV tube at the hospital. Follow these instructions at home:  Take over-the-counter and prescription medicines only as told by your health care provider.  Avoid iron pills and multivitamins that contain iron for the first 3-4 months of pregnancy. If you take prescription iron pills, do not stop taking them unless your health care provider approves.  Take the following actions to help prevent nausea and vomiting:  In the morning, before getting out of bed, try eating a couple of dry crackers or a piece of toast.  Avoid foods and smells that upset your stomach. Fatty and spicy foods may make nausea worse.  Eat 5-6 small meals a day.  Do not drink fluids while eating meals.  Drink between meals.  Eat or suck on things that have ginger in them. Ginger can help relieve nausea.  Avoid food preparation. The smell of food can spoil your appetite or trigger nausea.  Follow instructions from your health care provider about eating or drinking restrictions.  For snacks, eat high-protein foods, such as cheese.  Keep all follow-up and pre-birth (prenatal) visits as told by your health care provider. This is important. Contact a health care provider if:  You have pain in your abdomen.  You have a severe headache.  You have vision problems.  You are losing weight. Get help right away if:  You cannot drink fluids without vomiting.  You vomit blood.  You have constant nausea and vomiting.  You are very weak.  You are very thirsty.  You feel dizzy.  You faint.  You have a fever or other symptoms that last for more than 2-3 days.  You have a fever and your symptoms suddenly get worse. Summary  Hyperemesis gravidarum is a severe form of nausea and vomiting that happens during pregnancy.  Making some changes to your eating habits may help relieve nausea and vomiting.  This condition may be managed with medicine.  If medicines to do not help relieve nausea and vomiting, you may need to receive fluids through an IV tube at the hospital. This information is not intended to replace advice given to you by your health care provider. Make sure you discuss any questions you have with your health care provider. Document Released: 01/05/2005 Document Revised: 09/04/2015 Document Reviewed: 09/04/2015 Elsevier Interactive Patient Education  2017 [place] of Pregnancy The first trimester of pregnancy is from week 1 until the end of week 12 (months 1 through 3). During this time, your baby will begin to develop inside you. At 6-8 weeks, the eyes and face are formed, and the heartbeat can be seen on ultrasound. At the end of  12 weeks, all the  baby's organs are formed. Prenatal care is all the medical care you receive before the birth of your baby. Make sure you get good prenatal care and follow all of your doctor's instructions. Follow these instructions at home: Medicines  Take medicine only as told by your doctor. Some medicines are safe and some are not during pregnancy.  Take your prenatal vitamins as told by your doctor.  Take medicine that helps you poop (stool softener) as needed if your doctor says it is okay. Diet  Eat regular, healthy meals.  Your doctor will tell you the amount of weight gain that is right for you.  Avoid raw meat and uncooked cheese.  If you feel sick to your stomach (nauseous) or throw up (vomit):  Eat 4 or 5 small meals a day instead of 3 large meals.  Try eating a few soda crackers.  Drink liquids between meals instead of during meals.  If you have a hard time pooping (constipation):  Eat high-fiber foods like fresh vegetables, fruit, and whole grains.  Drink enough fluids to keep your pee (urine) clear or pale yellow. Activity and Exercise  Exercise only as told by your doctor. Stop exercising if you have cramps or pain in your lower belly (abdomen) or low back.  Try to avoid standing for long periods of time. Move your legs often if you must stand in one place for a long time.  Avoid heavy lifting.  Wear low-heeled shoes. Sit and stand up straight.  You can have sex unless your doctor tells you not to. Relief of Pain or Discomfort  Wear a good support bra if your breasts are sore.  Take warm water baths (sitz baths) to soothe pain or discomfort caused by hemorrhoids. Use hemorrhoid cream if your doctor says it is okay.  Rest with your legs raised if you have leg cramps or low back pain.  Wear support hose if you have puffy, bulging veins (varicose veins) in your legs. Raise (elevate) your feet for 15 minutes, 3-4 times a day. Limit salt in your diet. Prenatal  Care  Schedule your prenatal visits by the twelfth week of pregnancy.  Write down your questions. Take them to your prenatal visits.  Keep all your prenatal visits as told by your doctor. Safety  Wear your seat belt at all times when driving.  Make a list of emergency phone numbers. The list should include numbers for family, friends, the hospital, and police and fire departments. General Tips  Ask your doctor for a referral to a local prenatal class. Begin classes no later than at the start of month 6 of your pregnancy.  Ask for help if you need counseling or help with nutrition. Your doctor can give you advice or tell you where to go for help.  Do not use hot tubs, steam rooms, or saunas.  Do not douche or use tampons or scented sanitary pads.  Do not cross your legs for long periods of time.  Avoid litter boxes and soil used by cats.  Avoid all smoking, herbs, and alcohol. Avoid drugs not approved by your doctor.  Do not use any tobacco products, including cigarettes, chewing tobacco, and electronic cigarettes. If you need help quitting, ask your doctor. You may get counseling or other support to help you quit.  Visit your dentist. At home, brush your teeth with a soft toothbrush. Be gentle when you floss. Get help if:  You are dizzy.  You have  mild cramps or pressure in your lower belly.  You have a nagging pain in your belly area.  You continue to feel sick to your stomach, throw up, or have watery poop (diarrhea).  You have a bad smelling fluid coming from your vagina.  You have pain with peeing (urination).  You have increased puffiness (swelling) in your face, hands, legs, or ankles. Get help right away if:  You have a fever.  You are leaking fluid from your vagina.  You have spotting or bleeding from your vagina.  You have very bad belly cramping or pain.  You gain or lose weight rapidly.  You throw up blood. It may look like coffee grounds.  You  are around people who have [place] measles, fifth disease, or chickenpox.  You have a very bad headache.  You have shortness of breath.  You have any kind of trauma, such as from a fall or a car accident. This information is not intended to replace advice given to you by your health care provider. Make sure you discuss any questions you have with your health care provider. Document Released: 06/24/2007 Document Revised: 06/13/2015 Document Reviewed: 11/15/2012 Elsevier Interactive Patient Education  2017 [place]. Commonly Asked Questions During Pregnancy  Cats: A parasite can be excreted in cat feces.  To avoid exposure you need to have another person empty the little box.  If you must empty the litter box you will need to wear gloves.  Wash your hands after handling your cat.  This parasite can also be found in raw or undercooked meat so this should also be avoided.  Colds, Sore Throats, Flu: Please check your medication sheet to see what you can take for symptoms.  If your symptoms are unrelieved by these medications please call the office.  Dental Work: Most any dental work [employer] recommends is permitted.  X-rays should only be taken during the first trimester if absolutely necessary.  Your abdomen should be shielded with a lead apron during all x-rays.  Please notify your provider prior to receiving any x-rays.  Novocaine is fine; gas is not recommended.  If your dentist requires a note from [place] prior to dental work please call the office and we will provide one for you.  Exercise: Exercise is an important part of staying healthy during your pregnancy.  You may continue most exercises you were accustomed to prior to pregnancy.  Later in your pregnancy you will most likely notice you have difficulty with activities requiring balance like riding a bicycle.  It is important that you listen to your body and avoid activities that put you at a higher risk of falling.  Adequate rest and  staying well hydrated are a must!  If you have questions about the safety of specific activities ask your provider.    Exposure to Children with illness: Try to avoid obvious exposure; report any symptoms to [place] when noted,  If you have chicken pos, red measles or mumps, you should be immune to these diseases.   Please do not take any vaccines while pregnant unless you have checked with your OB provider.  Fetal Movement: After 28 weeks we recommend you do "kick counts" twice daily.  Lie or sit down in a calm quiet environment and count your baby movements "kicks".  You should feel your baby at least 10 times per hour.  If you have not felt 10 kicks within the first hour get up, walk around and have something sweet to  eat or drink then repeat for an additional hour.  If count remains less than 10 per hour notify your provider.  Fumigating: Follow your pest control agent's advice as to how long to stay out of your home.  Ventilate the area well before re-entering.  Hemorrhoids:   Most over-the-counter preparations can be used during pregnancy.  Check your medication to see what is safe to use.  It is important to use a stool softener or fiber in your diet and to drink lots of liquids.  If hemorrhoids seem to be getting worse please call the office.   Hot Tubs:  Hot tubs Jacuzzis and saunas are not recommended while pregnant.  These increase your internal body temperature and should be avoided.  Intercourse:  Sexual intercourse is safe during pregnancy as long as you are comfortable, unless otherwise advised by your provider.  Spotting may occur after intercourse; report any bright red bleeding that is heavier than spotting.  Labor:  If you know that you are in labor, please go to the hospital.  If you are unsure, please call the office and let us help you decide what to do.  Lifting, straining, etc:  If your job requires heavy lifting or straining please check with your provider for any limitations.   Generally, you should not lift items heavier than that you can lift simply with your hands and arms (no back muscles)  Painting:  Paint fumes do not harm your pregnancy, but may make you ill and should be avoided if possible.  Latex or water based paints have less odor than oils.  Use adequate ventilation while painting.  Permanents & Hair Color:  Chemicals in hair dyes are not recommended as they cause increase hair dryness which can increase hair loss during pregnancy.  " Highlighting" and permanents are allowed.  Dye may be absorbed differently and permanents may not hold as well during pregnancy.  Sunbathing:  Use a sunscreen, as skin burns easily during pregnancy.  Drink plenty of fluids; avoid over heating.  Tanning Beds:  Because their possible side effects are still unknown, tanning beds are not recommended.  Ultrasound Scans:  Routine ultrasounds are performed at approximately 20 weeks.  You will be able to see your baby's general anatomy an if you would like to know the gender this can usually be determined as well.  If it is questionable when you conceived you may also receive an ultrasound early in your pregnancy for dating purposes.  Otherwise ultrasound exams are not routinely performed unless there is a medical necessity.  Although you can request a scan we ask that you pay for it when conducted because insurance does not cover " patient request" scans.  Work: If your pregnancy proceeds without complications you may work until your due date, unless your physician or employer advises otherwise.  Round Ligament Pain/Pelvic Discomfort:  Sharp, shooting pains not associated with bleeding are fairly common, usually occurring in the second trimester of pregnancy.  They tend to be worse when standing up or when you remain standing for long periods of time.  These are the result of pressure of certain pelvic ligaments called "round ligaments".  Rest, Tylenol and heat seem to be the most  effective relief.  As the womb and fetus grow, they rise out of the pelvis and the discomfort improves.  Please notify the office if your pain seems different than that described.  It may represent a more serious condition.

## 2016-03-02 NOTE — Progress Notes (Signed)
[name] presents for NOB nurse interview visit. Pregnancy confirmation done by Dr. Ancil Boozer at [facility], UPT: positive, on 02/18/2016.  LMP: 01/13/2016 exact, but has irregular menses. G-2  P-0010. Had SAB at 8 wks. AMA. Pregnancy education material explained and given. No cats in the home. NOB labs ordered. TSH/HbgA1c due to Increased BMI,  sickle cell, HIV labs and Drug screen were explained optional and she did not decline. Drug screen ordered. PNV encouraged. Genetic screening options discussed. Genetic testing: Unsure.  Pt may discuss with provider.  Pt. To follow up with provider in 4 weeks for NOB physical.  All questions answered. Pt states she has gained 20 lbs since christmas and does not understand why. Along with holiday foods, pt was on HCTZ for B/P and was changed to Aldomet when pregnancy noted.

## 2016-03-03 LAB — GC/CHLAMYDIA PROBE AMP
CHLAMYDIA, DNA PROBE: NEGATIVE
Neisseria gonorrhoeae by PCR: NEGATIVE

## 2016-03-04 LAB — MONITOR DRUG PROFILE 14(MW)
AMPHETAMINE SCREEN URINE: NEGATIVE ng/mL
BARBITURATE SCREEN URINE: NEGATIVE ng/mL
BENZODIAZEPINE SCREEN, URINE: NEGATIVE ng/mL
BUPRENORPHINE, URINE: NEGATIVE ng/mL
CANNABINOIDS UR QL SCN: NEGATIVE ng/mL
COCAINE(METAB.)SCREEN, URINE: NEGATIVE ng/mL
Creatinine(Crt), U: 256.8 mg/dL (ref 20.0–300.0)
Fentanyl, Urine: NEGATIVE pg/mL
MEPERIDINE SCREEN, URINE: NEGATIVE ng/mL
Methadone Screen, Urine: NEGATIVE ng/mL
OXYCODONE+OXYMORPHONE UR QL SCN: NEGATIVE ng/mL
Opiate Scrn, Ur: NEGATIVE ng/mL
Ph of Urine: 6.1 (ref 4.5–8.9)
Phencyclidine Qn, Ur: NEGATIVE ng/mL
Propoxyphene Scrn, Ur: NEGATIVE ng/mL
SPECIFIC GRAVITY: 1.026
Tramadol Screen, Urine: NEGATIVE ng/mL

## 2016-03-04 LAB — URINALYSIS, ROUTINE W REFLEX MICROSCOPIC
BILIRUBIN UA: NEGATIVE
GLUCOSE, UA: NEGATIVE
Leukocytes, UA: NEGATIVE
NITRITE UA: NEGATIVE
Protein, UA: NEGATIVE
RBC, UA: NEGATIVE
UUROB: 0.2 mg/dL (ref 0.2–1.0)
pH, UA: 6.5 (ref 5.0–7.5)

## 2016-03-04 LAB — CULTURE, OB URINE

## 2016-03-04 LAB — NICOTINE SCREEN, URINE: Cotinine Ql Scrn, Ur: NEGATIVE ng/mL

## 2016-03-04 LAB — URINE CULTURE, OB REFLEX

## 2016-03-05 ENCOUNTER — Other Ambulatory Visit: Payer: Managed Care, Other (non HMO)

## 2016-03-06 ENCOUNTER — Other Ambulatory Visit: Payer: Self-pay | Admitting: Obstetrics and Gynecology

## 2016-03-06 ENCOUNTER — Ambulatory Visit (INDEPENDENT_AMBULATORY_CARE_PROVIDER_SITE_OTHER): Payer: Managed Care, Other (non HMO)

## 2016-03-06 ENCOUNTER — Other Ambulatory Visit: Payer: Managed Care, Other (non HMO)

## 2016-03-06 DIAGNOSIS — N926 Irregular menstruation, unspecified: Secondary | ICD-10-CM

## 2016-03-06 DIAGNOSIS — O09299 Supervision of pregnancy with other poor reproductive or obstetric history, unspecified trimester: Secondary | ICD-10-CM

## 2016-03-06 DIAGNOSIS — Z3481 Encounter for supervision of other normal pregnancy, first trimester: Secondary | ICD-10-CM | POA: Diagnosis not present

## 2016-03-09 LAB — CBC WITH DIFFERENTIAL/PLATELET
BASOS: 0 %
Basophils Absolute: 0 10*3/uL (ref 0.0–0.2)
EOS (ABSOLUTE): 0.1 10*3/uL (ref 0.0–0.4)
EOS: 1 %
HEMATOCRIT: 31.1 % — AB (ref 34.0–46.6)
HEMOGLOBIN: 10 g/dL — AB (ref 11.1–15.9)
Immature Grans (Abs): 0 10*3/uL (ref 0.0–0.1)
Immature Granulocytes: 0 %
LYMPHS ABS: 2.6 10*3/uL (ref 0.7–3.1)
Lymphs: 34 %
MCH: 24.8 pg — AB (ref 26.6–33.0)
MCHC: 32.2 g/dL (ref 31.5–35.7)
MCV: 77 fL — AB (ref 79–97)
MONOCYTES: 7 %
MONOS ABS: 0.5 10*3/uL (ref 0.1–0.9)
NEUTROS ABS: 4.4 10*3/uL (ref 1.4–7.0)
Neutrophils: 58 %
Platelets: 292 10*3/uL (ref 150–379)
RBC: 4.03 x10E6/uL (ref 3.77–5.28)
RDW: 15.9 % — AB (ref 12.3–15.4)
WBC: 7.7 10*3/uL (ref 3.4–10.8)

## 2016-03-09 LAB — ANTIBODY SCREEN: Antibody Screen: NEGATIVE

## 2016-03-09 LAB — HIV ANTIBODY (ROUTINE TESTING W REFLEX): HIV Screen 4th Generation wRfx: NONREACTIVE

## 2016-03-09 LAB — RH TYPE: Rh Factor: POSITIVE

## 2016-03-09 LAB — VARICELLA ZOSTER ANTIBODY, IGG: Varicella zoster IgG: 2568 index (ref >165)

## 2016-03-09 LAB — TSH: TSH: 0.453 u[IU]/mL (ref 0.450–4.500)

## 2016-03-09 LAB — RUBELLA SCREEN: Rubella Antibodies, IGG: 1.6 index (ref >0.99)

## 2016-03-09 LAB — SICKLE CELL SCREEN: SICKLE CELL SCREEN: NEGATIVE

## 2016-03-09 LAB — HEMOGLOBIN A1C
ESTIMATED AVERAGE GLUCOSE: 105 mg/dL
Hgb A1c MFr Bld: 5.3 % (ref 4.8–5.6)

## 2016-03-09 LAB — ABO

## 2016-03-09 LAB — HEPATITIS B SURFACE ANTIGEN: Hepatitis B Surface Ag: NEGATIVE

## 2016-03-09 LAB — RPR: RPR Ser Ql: NONREACTIVE

## 2016-03-12 ENCOUNTER — Telehealth: Payer: Self-pay | Admitting: Obstetrics and Gynecology

## 2016-03-12 NOTE — Telephone Encounter (Signed)
Pt called in saying she had some spotting last weekend.  She has not had any more this this.  She is 9 weeks.  No other symptoms and no cramping.  Please advise.  [phone]

## 2016-03-12 NOTE — Telephone Encounter (Signed)
Called pt no answer. LM for pt informing her of bleeding precautions, also sent mychart message with pt education

## 2016-03-24 ENCOUNTER — Ambulatory Visit: Payer: Managed Care, Other (non HMO) | Admitting: Family Medicine

## 2016-03-31 ENCOUNTER — Ambulatory Visit (INDEPENDENT_AMBULATORY_CARE_PROVIDER_SITE_OTHER): Payer: Managed Care, Other (non HMO) | Admitting: Obstetrics and Gynecology

## 2016-03-31 VITALS — BP 113/71 | HR 93 | Wt 304.1 lb

## 2016-03-31 DIAGNOSIS — O10011 Pre-existing essential hypertension complicating pregnancy, first trimester: Secondary | ICD-10-CM

## 2016-03-31 DIAGNOSIS — E282 Polycystic ovarian syndrome: Secondary | ICD-10-CM

## 2016-03-31 DIAGNOSIS — Z6841 Body Mass Index (BMI) 40.0 and over, adult: Secondary | ICD-10-CM

## 2016-03-31 DIAGNOSIS — E049 Nontoxic goiter, unspecified: Secondary | ICD-10-CM

## 2016-03-31 DIAGNOSIS — O09529 Supervision of elderly multigravida, unspecified trimester: Secondary | ICD-10-CM

## 2016-03-31 DIAGNOSIS — Z9889 Other specified postprocedural states: Secondary | ICD-10-CM

## 2016-03-31 DIAGNOSIS — Z131 Encounter for screening for diabetes mellitus: Secondary | ICD-10-CM

## 2016-03-31 DIAGNOSIS — B009 Herpesviral infection, unspecified: Secondary | ICD-10-CM

## 2016-03-31 LAB — POCT URINALYSIS DIPSTICK
BILIRUBIN UA: NEGATIVE
GLUCOSE UA: NEGATIVE
Ketones, UA: NEGATIVE
NITRITE UA: NEGATIVE
Spec Grav, UA: 1.03
Urobilinogen, UA: NEGATIVE — AB
pH, UA: 6.5

## 2016-03-31 NOTE — Progress Notes (Signed)
OBSTETRIC INITIAL PRENATAL VISIT  Subjective:    Michelle Ware is being seen today for her first obstetrical visit.  This is not a planned pregnancy. She is a [id number] female at [redacted]w[redacted]d gestation, Estimated Date of Delivery: 10/19/16 with Patient's last menstrual period was 01/13/2016 (exact date), consistent with 7 week sono. Her obstetrical history is significant for PCOS,  advanced maternal age, obesity and h/o previous miscarriage, h/o myomectomy (endometrial cavity breached). Relationship with FOB: significant other, not living together. Patient does intend to breast feed. Pregnancy history fully reviewed.    Obstetric History   G2   P0   T0   P0   A1   L0    SAB1   TAB0   Ectopic0   Multiple0   Live Births0     # Outcome Date GA Lbr Len/2nd Weight Sex Delivery Anes PTL Lv  2 Current           1 SAB 12/03/14 [redacted]w[redacted]d       FD    Obstetric Comments  1st Menstrual Cycle:  11  1st Pregnancy: 0    Gynecologic History:  Last pap smear was 12/2015.  Results were normal.  Notes remote h/o abnormal pap smear (LGSIL) in the past, no recent abnormalities.  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  Last outbreak was "a long time ago".    Past Medical History:  Diagnosis Date  . AMA (advanced maternal age) multigravida 39+, first trimester   . Dermatophytosis of foot   . Goiter   . Gross hematuria   . Herpes, genital   . Hypertension   . Low grade squamous intraepithelial lesion (LGSIL)   . Morbid obesity with BMI of 40.0-44.9, adult ([place])   . Polycystic ovarian syndrome   . Polycystic ovaries   . Vitamin D deficiency      Family History  Problem Relation Age of Onset  . Thyroid disease Mother   . Hypertension Mother      Past Surgical History:  Procedure Laterality Date  . BIOPSY THYROID  07/2015   Dr. Gabriel Carina  . LAPAROSCOPIC GELPORT ASSISTED MYOMECTOMY N/A 03/11/2015   Procedure: LAPAROSCOPIC  MYOMECTOMY--attempted;  Surgeon: Rubie Maid, MD;  Location: [facility] ORS;  Service:  Gynecology;  Laterality: N/A;  . LAPAROTOMY N/A 03/11/2015   Procedure: LAPAROTOMY--MYOMECTOMY;  Surgeon: Rubie Maid, MD;  Location: [facility] ORS;  Service: Gynecology;  Laterality: N/A;  . [place]     Social History   Social History  . Marital status: Single    Spouse name: N/A  . Number of children: 0  . Years of education: N/A   Occupational History  . 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 Smoking status: Never Smoker  . Smokeless tobacco: Never Used  . Alcohol use No  . Drug use: No  . Sexual activity: Yes    Partners: Male    Birth control/ protection: None   Other Topics Concern  . Not on file   Social History Narrative   Lives by herself, dating Carloyn Manner since 2015.      Current Outpatient Prescriptions on File Prior to Visit  Medication Sig Dispense Refill  . methyldopa (ALDOMET) 250 MG tablet Take 1 tablet (250 mg total) by mouth 3 (three) times daily. 90 tablet 1  . Prenatal Multivit-Min-Fe-FA (PRENATAL VITAMINS PO) Take 1 tablet by mouth daily.    . valACYclovir (VALTREX) 500 MG  tablet TAKE 1 TABLET BY MOUTH DAILY FOR PREVENTION AND THREE TIMES DAILY FOR OUTBREAKS 40 tablet 5   No current facility-administered medications on file prior to visit.      Allergies  Allergen Reactions  . Hydrocodone Itching      Review of Systems General:Not Present- Fever, Weight Loss and Weight Gain. Skin:Not Present- Rash. HEENT:Not Present- Blurred Vision, Headache and Bleeding Gums. Respiratory:Not Present- Difficulty Breathing. Breast:Not Present- Breast Mass. Cardiovascular:Not Present- Chest Pain, Elevated Blood Pressure, Fainting / Blacking Out and Shortness of Breath. Gastrointestinal:Not Present- Abdominal Pain, Constipation, Nausea and Vomiting. Female Genitourinary:Not Present- Frequency, Painful Urination, Pelvic Pain, Vaginal Bleeding, Vaginal Discharge, Contractions, regular, Fetal  Movements Decreased, Urinary Complaints and Vaginal Fluid. Musculoskeletal:Not Present- Back Pain and Leg Cramps. Neurological:Not Present- Dizziness. Psychiatric:Not Present- Depression.     Objective:   Blood pressure 113/71, pulse 93, weight (!) 304 lb 1.6 oz (137.9 kg), last menstrual period 01/13/2016.  Body mass index is 43.63 kg/m.   General Appearance:    Alert, cooperative, no distress, appears stated age, morbid obesity  Head:    Normocephalic, without obvious abnormality, atraumatic  Eyes:    PERRL, conjunctiva/corneas clear, EOM's intact, both eyes  Ears:    Normal external ear canals, both ears  Nose:   Nares normal, septum midline, mucosa normal, no drainage or sinus tenderness  Throat:   Lips, mucosa, and tongue normal; teeth and gums normal  Neck:   Supple, symmetrical, trachea midline, no adenopathy; thyroid:goiter present.  No tenderness/nodules; no carotid bruit or JVD  Back:     Symmetric, no curvature, ROM normal, no CVA tenderness  Lungs:     Clear to auscultation bilaterally, respirations unlabored  Chest Wall:    No tenderness or deformity   Heart:    Regular rate and rhythm, S1 and S2 normal, no murmur, rub or gallop  Breast Exam:    No tenderness, masses, or nipple abnormality  Abdomen:     Soft, non-tender, bowel sounds active all four quadrants, no masses, no organomegaly.  FHT 062 bpm.  Infraumbilical midline incision, well healed.   Genitalia:    Pelvic:external genitalia normal, vagina without lesions, discharge, or tenderness, rectovaginal septum  normal. Cervix normal in appearance, no cervical motion tenderness, no adnexal masses or tenderness.  Pregnancy positive findings: uterine enlargement: 12 wk size, nontender.   Rectal:    Normal external sphincter.  No hemorrhoids appreciated. Internal exam not done.   Extremities:   Extremities normal, atraumatic, no cyanosis or edema  Pulses:   2+ and symmetric all extremities  Skin:   Skin color, texture,  turgor normal, no rashes or lesions.  Hyperthecosis of mandibular and chin regions.   Lymph nodes:   Cervical, supraclavicular, and axillary nodes normal  Neurologic:   CNII-XII intact, normal strength, sensation and reflexes throughout      Assessment:    Pregnancy at 11 and 1/7 weeks  Advanced maternal age Morbid obesity (BMI 73) H/o previous miscarriage Essential HTN H/o myomectomy (endometrial cavity breached). R Goiter H/o HSV infection PCOS  Plan:  1. Pregnancy at 11 and 1/7 weeks  - Initial labs reviewed. - Prenatal vitamins encouraged. - Problem list reviewed and updated. - New OB counseling:  The patient has been given an overview regarding routine prenatal care.   - Benefits of Breast Feeding were discussed. The patient is encouraged to consider nursing her baby post partum.  2. Advanced maternal age - Prenatal testing, optional genetic testing, and ultrasound use in pregnancy  were reviewed.  Patient desires cell-free DNA testing with Panorama.  Will have patient to return and perform in 1 week (to increase fetal fraction due to patient's obesity).   3. Morbid obesity (BMI 43) - Recommendations regarding diet, weight gain, and exercise in pregnancy were given.  Patient should aim to gain no more than 15 lbs this pregnancy.   - Will need early glucola.   4. H/o previous miscarriage (1st trimester)  - Viability confirmed on today's visit.  Given reassurance.   5. Essential HTN  - Patient currently taking Aldomet 3 x daily.  Will continue at current dose s BPs well controlled.   - To check baseline CMP and creatinine function.  - Will need to begin antenatal testing at 32 weeks as well as serial growth scans during the pregnancy.   - Will need to begin daily baby aspirin at 16 weeks.  - Will need to be scheduled for delivery after 37 weeks.   6. H/o myomectomy (endometrial cavity breached).  - Discussed need for delivery via C-section due to prior uterine surgery.   Patient notes understanding.   7. Goiter - Asymptomatic.  Has had h/o recent negative biopsy of thyroid nodule.  Recent thyroid study normal.  Continue to monitor for symptoms during pregnancy.  8. H/o HSV infection - No recent outbreaks.  Patient will need to begin HSV suppression at 36 weeks.   9. PCOS  - patient with several small cysts on left ovary, with left ovary slightly enlarged on dating ultrasound.  Currently asymptomatic.  Will f/u on anatomy scan, or sooner if patient becomes symptomatic.  Follow up in 4 weeks.  50% of 30 min visit spent on counseling and coordination of care.    Rubie Maid, MD [facility]

## 2016-04-01 ENCOUNTER — Encounter: Payer: Self-pay | Admitting: Obstetrics and Gynecology

## 2016-04-02 ENCOUNTER — Encounter: Payer: Self-pay | Admitting: Obstetrics and Gynecology

## 2016-04-07 ENCOUNTER — Other Ambulatory Visit: Payer: Managed Care, Other (non HMO)

## 2016-04-07 DIAGNOSIS — Z131 Encounter for screening for diabetes mellitus: Secondary | ICD-10-CM

## 2016-04-07 DIAGNOSIS — Z6841 Body Mass Index (BMI) 40.0 and over, adult: Secondary | ICD-10-CM

## 2016-04-07 DIAGNOSIS — O10011 Pre-existing essential hypertension complicating pregnancy, first trimester: Secondary | ICD-10-CM

## 2016-04-08 LAB — COMPREHENSIVE METABOLIC PANEL
ALBUMIN: 4.2 g/dL (ref 3.5–5.5)
ALK PHOS: 30 IU/L — AB (ref 39–117)
ALT: 14 IU/L (ref 0–32)
AST: 19 IU/L (ref 0–40)
Albumin/Globulin Ratio: 1.6 (ref 1.2–2.2)
BUN / CREAT RATIO: 12 (ref 9–23)
BUN: 10 mg/dL (ref 6–20)
CHLORIDE: 101 mmol/L (ref 96–106)
CO2: 21 mmol/L (ref 18–29)
Calcium: 9.3 mg/dL (ref 8.7–10.2)
Creatinine, Ser: 0.81 mg/dL (ref 0.57–1.00)
GFR calc Af Amer: 107 mL/min/{1.73_m2} (ref >59)
GFR calc non Af Amer: 92 mL/min/{1.73_m2} (ref >59)
GLOBULIN, TOTAL: 2.6 g/dL (ref 1.5–4.5)
GLUCOSE: 84 mg/dL (ref 65–99)
Potassium: 4.4 mmol/L (ref 3.5–5.2)
SODIUM: 136 mmol/L (ref 134–144)
Total Protein: 6.8 g/dL (ref 6.0–8.5)

## 2016-04-08 LAB — PROTEIN / CREATININE RATIO, URINE
CREATININE, UR: 212.5 mg/dL
Protein, Ur: 16.2 mg/dL
Protein/Creat Ratio: 76 mg/g creat (ref 0–200)

## 2016-04-14 ENCOUNTER — Telehealth: Payer: Self-pay | Admitting: Obstetrics and Gynecology

## 2016-04-14 DIAGNOSIS — R002 Palpitations: Secondary | ICD-10-CM

## 2016-04-14 DIAGNOSIS — I499 Cardiac arrhythmia, unspecified: Secondary | ICD-10-CM

## 2016-04-14 NOTE — Telephone Encounter (Signed)
Pt states last night and some today heart pumps normal then it is like a "thump" or hard beat. Pt is 13 wks gest. Please advise. [phone].

## 2016-04-14 NOTE — Telephone Encounter (Signed)
Called pt she states that she has had several episodes prior to pregnancy where she feels like her heart beat is normal then she feels as though she has several beats that are "thumps or thuds" pt denies SOB or pain. Advised pt that per Dr.Cherry it can be normal to have an irregular heartbeat in pregnancy, however due to her h/o hypertension we will place referral to cariology for baseline EKG. Referral placed.

## 2016-04-17 ENCOUNTER — Encounter: Payer: Self-pay | Admitting: Obstetrics and Gynecology

## 2016-04-21 ENCOUNTER — Telehealth: Payer: Self-pay | Admitting: Obstetrics and Gynecology

## 2016-04-21 NOTE — Telephone Encounter (Signed)
Patient called because she is unable to view her Michelle Ware results and she got an email saying that she could review them. Please advise

## 2016-04-21 NOTE — Telephone Encounter (Signed)
Pt calls and wants to know the results of her Panorama testing, upon review in the [place] system the pt's sample was not able to be resulted due to insufficient fetal DNA. Advised pt to come back into office for redraw. Pt gave verbal understanding.

## 2016-04-23 ENCOUNTER — Other Ambulatory Visit: Payer: Self-pay

## 2016-04-23 ENCOUNTER — Ambulatory Visit
Admission: RE | Admit: 2016-04-23 | Discharge: 2016-04-23 | Disposition: A | Discharge status: Home / Self Care | Payer: Managed Care, Other (non HMO) | Source: Ambulatory Visit | Attending: Obstetrics and Gynecology | Admitting: Obstetrics and Gynecology

## 2016-04-23 DIAGNOSIS — I10 Essential (primary) hypertension: Secondary | ICD-10-CM

## 2016-04-23 DIAGNOSIS — R002 Palpitations: Secondary | ICD-10-CM

## 2016-04-28 ENCOUNTER — Other Ambulatory Visit: Payer: Managed Care, Other (non HMO)

## 2016-04-28 ENCOUNTER — Ambulatory Visit (INDEPENDENT_AMBULATORY_CARE_PROVIDER_SITE_OTHER): Payer: Managed Care, Other (non HMO) | Admitting: Obstetrics and Gynecology

## 2016-04-28 VITALS — BP 134/72 | HR 105 | Wt 306.0 lb

## 2016-04-28 DIAGNOSIS — Z1379 Encounter for other screening for genetic and chromosomal anomalies: Secondary | ICD-10-CM

## 2016-04-28 DIAGNOSIS — O10012 Pre-existing essential hypertension complicating pregnancy, second trimester: Secondary | ICD-10-CM | POA: Insufficient documentation

## 2016-04-28 DIAGNOSIS — O09529 Supervision of elderly multigravida, unspecified trimester: Secondary | ICD-10-CM

## 2016-04-28 DIAGNOSIS — Z6841 Body Mass Index (BMI) 40.0 and over, adult: Secondary | ICD-10-CM

## 2016-04-28 DIAGNOSIS — R002 Palpitations: Secondary | ICD-10-CM | POA: Insufficient documentation

## 2016-04-28 LAB — POCT URINALYSIS DIPSTICK
GLUCOSE UA: NEGATIVE
Ketones, UA: 5
Nitrite, UA: NEGATIVE
RBC UA: NEGATIVE
Spec Grav, UA: 1.015 (ref 1.030–1.035)
Urobilinogen, UA: 0.2 (ref ?–2.0)
pH, UA: 6.5 (ref 5.0–8.0)

## 2016-04-28 MED ORDER — ASPIRIN EC 81 MG PO TBEC: 81.0000 mg | DELAYED_RELEASE_TABLET | Freq: Every day | ORAL | 2 refills | Status: DC

## 2016-04-28 NOTE — Patient Instructions (Signed)
To begin baby aspirin 81 mg at [redacted] weeks gestation.    Second Trimester of Pregnancy The second trimester is from week 14 through week 27 (months 4 through 6). The second trimester is often a time when you feel your best. Your body has adjusted to being pregnant, and you begin to feel better physically. Usually, morning sickness has lessened or quit completely, you may have more energy, and you may have an increase in appetite. The second trimester is also a time when the fetus is growing rapidly. At the end of the sixth month, the fetus is about 9 inches long and weighs about 1 pounds. You will likely begin to feel the baby move (quickening) between 16 and 20 weeks of pregnancy. Body changes during your second trimester Your body continues to go through many changes during your second trimester. The changes vary from woman to woman.  Your weight will continue to increase. You will notice your lower abdomen bulging out.  You may begin to get stretch marks on your hips, abdomen, and breasts.  You may develop headaches that can be relieved by medicines. The medicines should be approved by your health care provider.  You may urinate more often because the fetus is pressing on your bladder.  You may develop or continue to have heartburn as a result of your pregnancy.  You may develop constipation because certain hormones are causing the muscles that push waste through your intestines to slow down.  You may develop hemorrhoids or swollen, bulging veins (varicose veins).  You may have back pain. This is caused by:  Weight gain.  Pregnancy hormones that are relaxing the joints in your pelvis.  A shift in weight and the muscles that support your balance.  Your breasts will continue to grow and they will continue to become tender.  Your gums may bleed and may be sensitive to brushing and flossing.  Dark spots or blotches (chloasma, mask of pregnancy) may develop on your face. This will  likely fade after the baby is born.  A dark line from your belly button to the pubic area (linea nigra) may appear. This will likely fade after the baby is born.  You may have changes in your hair. These can include thickening of your hair, rapid growth, and changes in texture. Some women also have hair loss during or after pregnancy, or hair that feels dry or thin. Your hair will most likely return to normal after your baby is born. What to expect at prenatal visits During a routine prenatal visit:  You will be weighed to make sure you and the fetus are growing normally.  Your blood pressure will be taken.  Your abdomen will be measured to track your baby's growth.  The fetal heartbeat will be listened to.  Any test results from the previous visit will be discussed. Your health care provider may ask you:  How you are feeling.  If you are feeling the baby move.  If you have had any abnormal symptoms, such as leaking fluid, bleeding, severe headaches, or abdominal cramping.  If you are using any tobacco products, including cigarettes, chewing tobacco, and electronic cigarettes.  If you have any questions. Other tests that may be performed during your second trimester include:  Blood tests that check for:  Low iron levels (anemia).  High blood sugar that affects pregnant women (gestational diabetes) between 34 and 28 weeks.  Rh antibodies. This is to check for a protein on red blood cells (Rh  factor).  Urine tests to check for infections, diabetes, or protein in the urine.  An ultrasound to confirm the proper growth and development of the baby.  An amniocentesis to check for possible genetic problems.  Fetal screens for spina bifida and Down syndrome.  HIV (human immunodeficiency virus) testing. Routine prenatal testing includes screening for HIV, unless you choose not to have this test. Follow these instructions at home: Medicines   Follow your health care provider's  instructions regarding medicine use. Specific medicines may be either safe or unsafe to take during pregnancy.  Take a prenatal vitamin that contains at least 600 micrograms (mcg) of folic acid.  If you develop constipation, try taking a stool softener if your health care provider approves. Eating and drinking   Eat a balanced diet that includes fresh fruits and vegetables, whole grains, good sources of protein such as meat, eggs, or tofu, and low-fat dairy. Your health care provider will help you determine the amount of weight gain that is right for you.  Avoid raw meat and uncooked cheese. These carry germs that can cause birth defects in the baby.  If you have low calcium intake from food, talk to your health care provider about whether you should take a daily calcium supplement.  Limit foods that are high in fat and processed sugars, such as fried and sweet foods.  To prevent constipation:  Drink enough fluid to keep your urine clear or pale yellow.  Eat foods that are high in fiber, such as fresh fruits and vegetables, whole grains, and beans. Activity   Exercise only as directed by your health care provider. Most women can continue their usual exercise routine during pregnancy. Try to exercise for 30 minutes at least 5 days a week. Stop exercising if you experience uterine contractions.  Avoid heavy lifting, wear low heel shoes, and practice good posture.  A sexual relationship may be continued unless your health care provider directs you otherwise. Relieving pain and discomfort   Wear a good support bra to prevent discomfort from breast tenderness.  Take warm sitz baths to soothe any pain or discomfort caused by hemorrhoids. Use hemorrhoid cream if your health care provider approves.  Rest with your legs elevated if you have leg cramps or low back pain.  If you develop varicose veins, wear support hose. Elevate your feet for 15 minutes, 3-4 times a day. Limit salt in your  diet. Prenatal Care   Write down your questions. Take them to your prenatal visits.  Keep all your prenatal visits as told by your health care provider. This is important. Safety   Wear your seat belt at all times when driving.  Make a list of emergency phone numbers, including numbers for family, friends, the hospital, and police and fire departments. General instructions   Ask your health care provider for a referral to a local prenatal education class. Begin classes no later than the beginning of month 6 of your pregnancy.  Ask for help if you have counseling or nutritional needs during pregnancy. Your health care provider can offer advice or refer you to specialists for help with various needs.  Do not use hot tubs, steam rooms, or saunas.  Do not douche or use tampons or scented sanitary pads.  Do not cross your legs for long periods of time.  Avoid cat litter boxes and soil used by cats. These carry germs that can cause birth defects in the baby and possibly loss of the fetus by miscarriage  or stillbirth.  Avoid all smoking, herbs, alcohol, and unprescribed drugs. Chemicals in these products can affect the formation and growth of the baby.  Do not use any products that contain nicotine or tobacco, such as cigarettes and e-cigarettes. If you need help quitting, ask your health care provider.  Visit your dentist if you have not gone yet during your pregnancy. Use a soft toothbrush to brush your teeth and be gentle when you floss. Contact a health care provider if:  You have dizziness.  You have mild pelvic cramps, pelvic pressure, or nagging pain in the abdominal area.  You have persistent nausea, vomiting, or diarrhea.  You have a bad smelling vaginal discharge.  You have pain when you urinate. Get help right away if:  You have a fever.  You are leaking fluid from your vagina.  You have spotting or bleeding from your vagina.  You have severe abdominal cramping or  pain.  You have rapid weight gain or weight loss.  You have shortness of breath with chest pain.  You notice sudden or extreme swelling of your face, hands, ankles, feet, or legs.  You have not felt your baby move in over an hour.  You have severe headaches that do not go away when you take medicine.  You have vision changes. Summary  The second trimester is from week 14 through week 27 (months 4 through 6). It is also a time when the fetus is growing rapidly.  Your body goes through many changes during pregnancy. The changes vary from woman to woman.  Avoid all smoking, herbs, alcohol, and unprescribed drugs. These chemicals affect the formation and growth your baby.  Do not use any tobacco products, such as cigarettes, chewing tobacco, and e-cigarettes. If you need help quitting, ask your health care provider.  Contact your health care provider if you have any questions. Keep all prenatal visits as told by your health care provider. This is important. This information is not intended to replace advice given to you by your health care provider. Make sure you discuss any questions you have with your health care provider. Document Released: 12/30/2000 Document Revised: 06/13/2015 Document Reviewed: 03/08/2012 Elsevier Interactive Patient Education  2017 [employer].

## 2016-04-28 NOTE — Progress Notes (Signed)
ROB- pt did early gluola today, she is doing well

## 2016-04-28 NOTE — Progress Notes (Signed)
ROB: Still noting heart palpitations, although occurring less frequently. Reviewed EKG which was negative (performed for palpitations and h/o cHTN). Notes she may be feeling fetal flutters.  For msAFP and early glucola today.  Repeated Panorama last week due to insufficient fetal fraction on initial draw. F/u results. Advised to begin baby aspirin next week. RTC in 4 weeks, for anatomy scan at that time.

## 2016-04-29 LAB — GLUCOSE, 1 HOUR GESTATIONAL: GESTATIONAL DIABETES SCREEN: 98 mg/dL (ref 65–139)

## 2016-04-30 ENCOUNTER — Ambulatory Visit: Payer: Managed Care, Other (non HMO) | Admitting: Cardiology

## 2016-05-01 ENCOUNTER — Telehealth: Payer: Self-pay | Admitting: Obstetrics and Gynecology

## 2016-05-01 ENCOUNTER — Encounter: Payer: Self-pay | Admitting: Obstetrics and Gynecology

## 2016-05-01 LAB — AFP, SERUM, OPEN SPINA BIFIDA
AFP MoM: 1.04
AFP Value: 21.5 ng/mL
GEST. AGE ON COLLECTION DATE: 15.1 wk
Maternal Age At EDD: 38.5 yr
OSBR RISK 1 IN: 10000
TEST RESULTS AFP: NEGATIVE
Weight: 306 [lb_av]

## 2016-05-01 NOTE — Telephone Encounter (Signed)
Called pt informed her of negative AFP and that Panorama is still processing. Pt gave verbal understanding.

## 2016-05-01 NOTE — Telephone Encounter (Signed)
Please call patient concerning test result that she seen on mychart - she doesn't understand what it means

## 2016-05-06 ENCOUNTER — Encounter: Payer: Self-pay | Admitting: Family Medicine

## 2016-05-06 ENCOUNTER — Ambulatory Visit (INDEPENDENT_AMBULATORY_CARE_PROVIDER_SITE_OTHER): Payer: Managed Care, Other (non HMO) | Admitting: Family Medicine

## 2016-05-06 VITALS — BP 128/72 | HR 88 | Temp 98.7°F | Resp 16 | Ht 70.0 in | Wt 307.5 lb

## 2016-05-06 DIAGNOSIS — M25531 Pain in right wrist: Secondary | ICD-10-CM

## 2016-05-06 NOTE — Progress Notes (Signed)
128/72 

## 2016-05-06 NOTE — Patient Instructions (Addendum)
Please check with your OB/GYN to verify your maximum allowed dose of Tylenol per day.  You may take Tylenol as needed for your pain as directed by your OB/GYN. Please call if not improved in 3-5 days and we will refer you to the [facility] for Routine Care of Injuries Theroutine [email or web address] rest, ice, compression, and elevation (RICE therapy). RICE therapy is often recommended for injuries to soft tissues, such as a muscle strain, ligament injuries, bruises, and overuse injuries. It can also be used for some bony injuries. Using RICE therapy can help to relieve pain, lessen swelling, and enable your body to heal. Rest Rest is required to allow your body to heal. This usually involves reducing your normal activities and avoiding use of the injured part of your body. Generally, you can return to your normal activities when you are comfortable and have been given permission by your health care provider. Ice   Icing your injury helps to keep the swelling down, and it lessens pain. Do not apply ice directly to your skin.  Put ice in a plastic bag.  Place a towel between your skin and the bag.  Leave the ice on for 20 minutes, 2-3 times a day. Do this for as long as you are directed by your health care provider. Compression Compression means putting pressure on the injured area. Compression helps to keep swelling down, gives support, and helps with discomfort. Compression may be done with an elastic bandage. If an elastic bandage has been applied, follow these general tips:  Remove and reapply the bandage every 3-4 hours or as directed by your health care provider.  Make sure the bandage is not wrapped too tightly, because this can cut off circulation. If part of your body beyond the bandage becomes blue, numb, cold, swollen, or more painful, your bandage is most likely too tight. If this occurs, remove your bandage and reapply it more loosely.  See your health care  provider if the bandage seems to be making your problems worse rather than better. Elevation   Elevation means keeping the injured area raised. This helps to lessen swelling and decrease pain. If possible, your injured area should be elevated at or above the level of your heart or the center of your chest. When should I seek medical care?  If your pain and swelling continue.  If your symptoms are getting worse rather than improving. These symptoms may indicate that further evaluation or further X-rays are needed. Sometimes, X-rays may not show a small broken bone (fracture) until a number of days later. Make a follow-up appointment with your health care provider. When should I seek immediate medical care?  If you have sudden severe pain at or below the area of your injury.  If you have redness or increased swelling around your injury.  If you have tingling or numbness at or below the area of your injury that does not improve after you remove the elastic bandage. This information is not intended to replace advice given to you by your health care provider. Make sure you discuss any questions you have with your health care provider. Document Released: 04/19/2000 Document Revised: 06/11/2015 Document Reviewed: 12/13/2013 Elsevier Interactive Patient Education  2017 [employer] for Routine Care of Injuries Theroutine [email or web address] rest, ice, compression, and elevation (RICE therapy). RICE therapy is often recommended for injuries to soft tissues, such as a muscle strain, ligament injuries, bruises, and overuse injuries. It can also be used for some bony  injuries. Using RICE therapy can help to relieve pain, lessen swelling, and enable your body to heal. Rest Rest is required to allow your body to heal. This usually involves reducing your normal activities and avoiding use of the injured part of your body. Generally, you can return to your normal activities when you are  comfortable and have been given permission by your health care provider. Ice   Icing your injury helps to keep the swelling down, and it lessens pain. Do not apply ice directly to your skin.  Put ice in a plastic bag.  Place a towel between your skin and the bag.  Leave the ice on for 20 minutes, 2-3 times a day. Do this for as long as you are directed by your health care provider. Compression Compression means putting pressure on the injured area. Compression helps to keep swelling down, gives support, and helps with discomfort. Compression may be done with an elastic bandage. If an elastic bandage has been applied, follow these general tips:  Remove and reapply the bandage every 3-4 hours or as directed by your health care provider.  Make sure the bandage is not wrapped too tightly, because this can cut off circulation. If part of your body beyond the bandage becomes blue, numb, cold, swollen, or more painful, your bandage is most likely too tight. If this occurs, remove your bandage and reapply it more loosely.  See your health care provider if the bandage seems to be making your problems worse rather than better. Elevation   Elevation means keeping the injured area raised. This helps to lessen swelling and decrease pain. If possible, your injured area should be elevated at or above the level of your heart or the center of your chest.  When should I seek medical care?  If your pain and swelling continue.  If your symptoms are getting worse rather than improving. These symptoms may indicate that further evaluation or further X-rays are needed. Sometimes, X-rays may not show a small broken bone (fracture) until a number of days later. Make a follow-up appointment with your health care provider. When should I seek immediate medical care?  If you have sudden severe pain at or below the area of your injury.  If you have redness or increased swelling around your injury.  If you have  tingling or numbness at or below the area of your injury that does not improve after you remove the elastic bandage. This information is not intended to replace advice given to you by your health care provider. Make sure you discuss any questions you have with your health care provider. Document Released: 04/19/2000 Document Revised: 06/11/2015 Document Reviewed: 12/13/2013 Elsevier Interactive Patient Education  2017 [employer].

## 2016-05-06 NOTE — Progress Notes (Addendum)
Name: Michelle Ware   MRN: [id number]    DOB: 04/13/1980   Date:05/06/2016       Progress Note  Subjective  Chief Complaint  Chief Complaint  Patient presents with  . Wrist Pain    right wrist pain for 5 days, swelling, sore    HPI  Pt presents with 5 day history of right wrist swelling and pain.  Symptoms began upon waking and no memory of any injury or trauma, but she does note that she was lifting a lot of empty boxes the day before symptoms began.  No concern for bug bite or puncture of any kind. Pt is [redacted] weeks pregnant and states pregnancy has been going well.  Patient Active Problem List   Diagnosis Date Noted  . Heart palpitations 04/28/2016  . Pre-existing essential hypertension during pregnancy in second trimester 04/28/2016  . Encounter for supervision of high risk multigravida of advanced maternal age, antepartum 03/31/2016  . Morbid obesity with BMI of 40.0-44.9, adult ([place]) 03/31/2016  . AMA (advanced maternal age) multigravida 27+, first trimester   . Thyroid nodule 07/07/2015  . H/O myomectomy 03/11/2015  . Essential hypertension 11/28/2014  . Obesity, Class III, BMI 40-49.9 (morbid obesity) ([place]) 11/28/2014  . Decreased thyroid stimulating hormone (TSH) level 07/24/2014  . Hypertrichosis 07/24/2014  . Vitamin D deficiency 07/24/2014  . Thyromegaly 07/24/2014  . PCOS (polycystic ovarian syndrome) 07/24/2014  . History of abnormal cervical Pap smear 11/24/2011  . Genital herpes 11/29/2008    Social History  Substance Use Topics  . Smoking status: Never Smoker  . Smokeless tobacco: Never Used  . Alcohol use No     Current Outpatient Prescriptions:  .  aspirin EC 81 MG tablet, Take 1 tablet (81 mg total) by mouth daily. Take after 12 weeks for prevention of preeclampssia later in pregnancy, Disp: 300 tablet, Rfl: 2 .  methyldopa (ALDOMET) 250 MG tablet, Take 1 tablet (250 mg total) by mouth 3 (three) times daily., Disp: 90 tablet, Rfl: 1 .  Prenatal  Multivit-Min-Fe-FA (PRENATAL VITAMINS PO), Take 1 tablet by mouth daily., Disp: , Rfl:  .  valACYclovir (VALTREX) 500 MG tablet, TAKE 1 TABLET BY MOUTH DAILY FOR PREVENTION AND THREE TIMES DAILY FOR OUTBREAKS, Disp: 40 tablet, Rfl: 5  Allergies  Allergen Reactions  . Hydrocodone Itching    ROS  Constitutional: Negative for fever/chills/malaise.  Respiratory: Negative for cough and shortness of breath.   Cardiovascular: Negative for chest pain or palpitations.  Gastrointestinal: Negative for abdominal pain, no bowel changes.  Musculoskeletal: Negative for gait problem. Positive for right wrist joint swelling.  Skin: Negative for rash; small amount of redness on right wrist.  Neurological: Negative for dizziness or headache.  No other specific complaints in a complete review of systems (except as listed in HPI above).  Objective  Vitals:   05/06/16 1505  BP: 128/72  Pulse: 88  Resp: 16  Temp: 98.7 F (37.1 C)  SpO2: 98%  Weight: (!) 307 lb 8 oz (139.5 kg)  Height: '5\' 10"'$  (1.778 m)    Body mass index is 44.12 kg/m.  Nursing Note and Vital Signs reviewed.  Physical Exam  Constitutional: Patient appears well-developed and well-nourished. Obese;No distress.  HEENT: head atraumatic, normocephalic Cardiovascular: Normal rate, regular rhythm, S1/S2 present.  No murmur or rub heard. No BLE edema. Pulmonary/Chest: Effort normal and breath sounds clear. No respiratory distress or retractions. MSK: Right wrist with tenderness over distal radial head; no snuffbox tenderness.  Full AROM, but pain  with resistance on AROM. Phalen's and Tinel's negative. Pulses/motor/sensory intact, cap refill <3seconds Psychiatric: Patient has a normal mood and affect. behavior is normal. Judgment and thought content normal.  Recent Results (from the past 2160 hour(s))  POCT urine pregnancy     Status: Abnormal   Collection Time: 02/18/16  3:42 PM  Result Value Ref Range   Preg Test, Ur Positive (A)  Negative  Culture, OB Urine     Status: None   Collection Time: 03/02/16  4:44 PM  Result Value Ref Range   Urine Culture, OB Final report   GC/Chlamydia Probe Amp     Status: None   Collection Time: 03/02/16  4:44 PM  Result Value Ref Range   Chlamydia trachomatis, NAA Negative Negative   Neisseria gonorrhoeae by PCR Negative Negative  Monitor Drug Profile 14(MW)     Status: None   Collection Time: 03/02/16  4:44 PM  Result Value Ref Range   AMPHETAMINE SCREEN URINE Negative Cutoff=1000 ng/mL   BARBITURATE SCREEN URINE Negative Cutoff=200 ng/mL   BENZODIAZEPINE SCREEN, URINE Negative Cutoff=200 ng/mL   CANNABINOIDS UR QL SCN Negative Cutoff=20 ng/mL   COCAINE(METAB.)SCREEN, URINE Negative Cutoff=300 ng/mL   OPIATE SCREEN URINE Negative Cutoff=300 ng/mL    Comment: Opiate test includes Codeine, Morphine, Hydromorphone, Hydrocodone.   OXYCODONE+OXYMORPHONE UR QL SCN Negative Cutoff=100 ng/mL    Comment: Test includes Oxycodone and Oxymorphone   PHENCYCLIDINE QUANTITATIVE URINE Negative Cutoff=25 ng/mL   Methadone Screen, Urine Negative Cutoff=300 ng/mL   PROPOXYPHENE, SCREEN Negative Cutoff=300 ng/mL   Meperidine Screen, Urine Negative Cutoff=200 ng/mL    Comment: This test was developed and its performance characteristics determined by [facility]. It has not been cleared or approved by the Food and Drug Administration.    Tramadol Screen, Urine Negative Cutoff=200 ng/mL   Fentanyl, Urine Negative Cutoff=2000 pg/mL    Comment: Test includes Fentanyl and Norfentanyl This test was developed and its performance characteristics determined by [facility]. It has not been cleared or approved by the Food and Drug Administration.    Buprenorphine, Urine Negative Cutoff=10 ng/mL   Creatinine(Crt), U 256.8 20.0 - 300.0 mg/dL   SPECIFIC GRAVITY 1.026    Ph of Urine 6.1 4.5 - 8.9   Please Note: Comment     Comment: Drug-test results should be interpreted in the context of clinical information.  Patient metabolic variables, specific drug chemistry, and specimen characteristics can affect test outcome. Technical consultation is available if a test result is inconsistent with an expected outcome. ([email or web address] or call toll-free [phone]) Drug brands, if listed herein, are trademarks of their respective owners.   Nicotine screen, urine     Status: None   Collection Time: 03/02/16  4:44 PM  Result Value Ref Range   COTININE UR QL SCN Negative Cutoff=300 ng/mL   Drug Screen Comment: Comment     Comment: This assay provides a preliminary unconfirmed analytical test result that may be suitable for the clinical management of patients in certain situations.  For workplace drug testing programs, preliminary positive findings should always be confirmed by an alternative method. Some over-the-counter medications, as well as adulterants, may cause inaccurate results. Screen Only testing does not meet the [employer] Forensic Urine Drug Testing Program requirements as a forensic urine drug test for workplace testing. All clients must ensure that their testing program conforms to applicable state and federal laws and employment agreements.   Urinalysis, Routine w reflex microscopic     Status: Abnormal   Collection Time: 03/02/16  4:44 PM  Result Value Ref Range   Specific Gravity, UA      >=1.030 (A) 1.005 - 1.030   pH, UA 6.5 5.0 - 7.5   Color, UA Yellow Yellow   Appearance Ur Clear Clear   Leukocytes, UA Negative Negative   Protein, UA Negative Negative/Trace   Glucose, UA Negative Negative   Ketones, UA Trace (A) Negative   RBC, UA Negative Negative   Bilirubin, UA Negative Negative   Urobilinogen, Ur 0.2 0.2 - 1.0 mg/dL   Nitrite, UA Negative Negative   Microscopic Examination Comment     Comment: Microscopic not indicated and not performed.  Result     Status: None   Collection Time: 03/02/16  4:44 PM  Result Value Ref Range    Result 1 Comment     Comment: Mixed urogenital flora 10,000-25,000 colony forming units per mL   ABO     Status: None   Collection Time: 03/06/16  3:48 PM  Result Value Ref Range   ABO Grouping O   Antibody screen     Status: None   Collection Time: 03/06/16  3:48 PM  Result Value Ref Range   Antibody Screen Negative Negative  CBC with Differential/Platelet     Status: Abnormal   Collection Time: 03/06/16  3:48 PM  Result Value Ref Range   WBC 7.7 3.4 - 10.8 x10E3/uL   RBC 4.03 3.77 - 5.28 x10E6/uL   Hemoglobin 10.0 (L) 11.1 - 15.9 g/dL   Hematocrit 31.1 (L) 34.0 - 46.6 %   MCV 77 (L) 79 - 97 fL   MCH 24.8 (L) 26.6 - 33.0 pg   MCHC 32.2 31.5 - 35.7 g/dL   RDW 15.9 (H) 12.3 - 15.4 %   Platelets 292 150 - 379 x10E3/uL   Neutrophils 58 Not Estab. %   Lymphs 34 Not Estab. %   Monocytes 7 Not Estab. %   Eos 1 Not Estab. %   Basos 0 Not Estab. %   Neutrophils Absolute 4.4 1.4 - 7.0 x10E3/uL   Lymphocytes Absolute 2.6 0.7 - 3.1 x10E3/uL   Monocytes Absolute 0.5 0.1 - 0.9 x10E3/uL   EOS (ABSOLUTE) 0.1 0.0 - 0.4 x10E3/uL   Basophils Absolute 0.0 0.0 - 0.2 x10E3/uL   Immature Granulocytes 0 Not Estab. %   Immature Grans (Abs) 0.0 0.0 - 0.1 x10E3/uL  Hemoglobin A1c     Status: None   Collection Time: 03/06/16  3:48 PM  Result Value Ref Range   Hgb A1c MFr Bld 5.3 4.8 - 5.6 %    Comment:          Pre-diabetes: 5.7 - 6.4          Diabetes: >6.4          Glycemic control for adults with diabetes: <7.0    Est. average glucose Bld gHb Est-mCnc 105 mg/dL  Hepatitis B surface antigen     Status: None   Collection Time: 03/06/16  3:48 PM  Result Value Ref Range   Hepatitis B Surface Ag Negative Negative  HIV antibody     Status: None   Collection Time: 03/06/16  3:48 PM  Result Value Ref Range   HIV Screen 4th Generation wRfx Non Reactive Non Reactive  Rh Type     Status: None   Collection Time: 03/06/16  3:48 PM  Result Value Ref Range   Rh Factor Positive     Comment: Please  note: Prior records for this patient's ABO /  Rh type are not available for additional verification.   RPR     Status: None   Collection Time: 03/06/16  3:48 PM  Result Value Ref Range   RPR Ser Ql Non Reactive Non Reactive  Rubella screen     Status: None   Collection Time: 03/06/16  3:48 PM  Result Value Ref Range   Rubella Antibodies, IGG 1.60 Immune >0.99 index    Comment:                                 Non-immune       <0.90                                 Equivocal  0.90 - 0.99                                 Immune           >0.99   TSH     Status: None   Collection Time: 03/06/16  3:48 PM  Result Value Ref Range   TSH 0.453 0.450 - 4.500 uIU/mL  Varicella zoster antibody, IgG     Status: None   Collection Time: 03/06/16  3:48 PM  Result Value Ref Range   Varicella zoster IgG 2,568 Immune >165 index    Comment:                                Negative          <135                                Equivocal    135 - 165                                Positive          >165 A positive result generally indicates exposure to the pathogen or administration of specific immunoglobulins, but it is not indication of active infection or stage of disease.   Sickle cell screen     Status: None   Collection Time: 03/06/16  3:48 PM  Result Value Ref Range   Sickle Cell Screen Negative Negative    Comment: Since a variety of conditions and other abnormal hemoglobins in addition to Hemoglobin S may give false-positive results, positive Hemoglobin Solubility tests should be confirmed by hemoglobin fractionation testing.   POCT urinalysis dipstick     Status: Abnormal   Collection Time: 03/31/16  4:36 PM  Result Value Ref Range   Color, UA dark yellow    Clarity, UA clear    Glucose, UA neg    Bilirubin, UA neg    Ketones, UA neg    Spec Grav, UA 1.030 1.003, 1.005, 1.010, 1.015, 1.020, 1.025, 1.030, 1.035   Blood, UA NHT    pH, UA 6.5 5.0, 5.5, 6.0, 6.5, 7.0, 7.5, 8.0    Protein, UA trace    Urobilinogen, UA negative (A) 0.2, 1.0   Nitrite, UA neg    Leukocytes, UA large (3+) (A) Negative  Comp Met (CMET)     Status: Abnormal  Collection Time: 04/07/16  5:11 PM  Result Value Ref Range   Glucose 84 65 - 99 mg/dL   BUN 10 6 - 20 mg/dL   Creatinine, Ser 0.81 0.57 - 1.00 mg/dL   GFR calc non Af Amer 92 >59 mL/min/1.73   GFR calc Af Amer 107 >59 mL/min/1.73   BUN/Creatinine Ratio 12 9 - 23   Sodium 136 134 - 144 mmol/L   Potassium 4.4 3.5 - 5.2 mmol/L   Chloride 101 96 - 106 mmol/L   CO2 21 18 - 29 mmol/L   Calcium 9.3 8.7 - 10.2 mg/dL   Total Protein 6.8 6.0 - 8.5 g/dL   Albumin 4.2 3.5 - 5.5 g/dL   Globulin, Total 2.6 1.5 - 4.5 g/dL   Albumin/Globulin Ratio 1.6 1.2 - 2.2   Bilirubin Total <0.2 0.0 - 1.2 mg/dL   Alkaline Phosphatase 30 (L) 39 - 117 IU/L   AST 19 0 - 40 IU/L   ALT 14 0 - 32 IU/L  Protein / creatinine ratio, urine     Status: None   Collection Time: 04/07/16  5:11 PM  Result Value Ref Range   Creatinine, Urine 212.5 Not Estab. mg/dL   Protein, Ur 16.2 Not Estab. mg/dL   Protein/Creat Ratio 76 0 - 200 mg/g creat  POCT urinalysis dipstick     Status: Abnormal   Collection Time: 04/28/16  3:53 PM  Result Value Ref Range   Color, UA yellow    Clarity, UA cloudy    Glucose, UA neg    Bilirubin, UA small    Ketones, UA 5    Spec Grav, UA 1.015 1.030 - 1.035   Blood, UA neg    pH, UA 6.5 5.0 - 8.0   Protein, UA trace    Urobilinogen, UA 0.2 Negative - 2.0   Nitrite, UA neg    Leukocytes, UA large (3+) (A) Negative  AFP, Serum, Open Spina Bifida     Status: None   Collection Time: 04/28/16  4:42 PM  Result Value Ref Range   Results Report    Test Results: *Screen Negative*    Gest. Age on Collection Date 15.1 weeks   Gestat. Age Based On EDD     Comment:                               10/19/2016 Recalculations are not recommended when gestational dating by LMP and ultrasound are within 10 days.    Maternal Age At EDD 38.5  yr   Race Black    Weight 306 lbs   Insulin Dep Diabetes No    Multiple Gestation No    AFP Value 21.5 ng/mL   AFP MoM 1.04    OSBR Risk       1 IN 10,000    Interpretation Comment     Comment: Interpretation: Screen Negative This result is screen negative for fetal OSB. The AFP MoM calculated is based on the gestational age provided. MS-AFP can identify up to 80% of open fetal neural tube defects. Closed neural tube defects and some open defects may not be detected by this test. This test does not screen for fetal Down Syndrome or Trisomy 18. If screening for Down Syndrome or Trisomy 18 is desired, Print production planner to discuss available options.  The [employer] of [employer]etricians and Gynecologists recommends amniocentesis be offered to women age 98 and older.    Comment:  Comment     Comment: Driscilla Moats, Ph.D., [place] Technical Director References: Available Upon Request. Multiples Of Median Cutoffs         For AFP Elevations [place]   2.5           Black    2.8 IDD         2.0           Twins    4.5         Abbreviation Definitions IDD - Insulin Dep Diabetes OSBR - Open Spina Bifida Risk For further inquiries contact [facility] at [phone].   Glucose, 1 hour gestational     Status: None   Collection Time: 04/28/16  4:52 PM  Result Value Ref Range   Gestational Diabetes Screen 98 65 - 139 mg/dL    Comment: According to ADA, a glucose threshold of >139 mg/dL after 50-gram load identifies approximately 80% of women with gestational diabetes mellitus, while the sensitivity is further increased to approximately 90% by a threshold of >129 mg/dL.      Assessment & Plan 1. 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 -PT to check with her OB/GYN to verify her maximum allowed dose of Tylenol per day. She is advised to take Tylenol PRN as directed by  her OB/GYN; advised to avoid NSAIDs. - Pt to call if not improved in 3-5 days and will to the Ortho. -Red flags and when to present for emergency care including new/worsening symptoms, pain unrelieved with Tylenol, and increase in redness or swelling reviewed with patient at time of visit. Follow up and care instructions discussed and provided in AVS.  I have reviewed this encounter including the documentation in this note and/or discussed this patient with the Johney Maine, FNP, NP-C. I am certifying that I agree with the content of this note as supervising physician.  [name], MD [facility] 05/07/2016, 7:49 AM

## 2016-05-11 ENCOUNTER — Telehealth: Payer: Self-pay | Admitting: Obstetrics and Gynecology

## 2016-05-11 ENCOUNTER — Encounter: Payer: Self-pay | Admitting: Obstetrics and Gynecology

## 2016-05-11 NOTE — Telephone Encounter (Signed)
Patient called requesting panorama/horizon results from round 3 weeks ago.Thanks

## 2016-05-12 ENCOUNTER — Ambulatory Visit (INDEPENDENT_AMBULATORY_CARE_PROVIDER_SITE_OTHER): Payer: Managed Care, Other (non HMO) | Admitting: Cardiology

## 2016-05-12 ENCOUNTER — Encounter: Payer: Self-pay | Admitting: Cardiology

## 2016-05-12 VITALS — BP 124/60 | HR 92 | Ht 70.0 in | Wt 310.2 lb

## 2016-05-12 DIAGNOSIS — R002 Palpitations: Secondary | ICD-10-CM

## 2016-05-12 DIAGNOSIS — I10 Essential (primary) hypertension: Secondary | ICD-10-CM | POA: Diagnosis not present

## 2016-05-12 DIAGNOSIS — R0602 Shortness of breath: Secondary | ICD-10-CM | POA: Diagnosis not present

## 2016-05-12 NOTE — Telephone Encounter (Signed)
Pt informed of results via mychart

## 2016-05-12 NOTE — Progress Notes (Signed)
Cardiology Office Note   Date:  05/12/2016   ID:  Michelle Ware, DOB 1980-04-27, MRN [id number]  Referring Doctor:  Loistine Chance, MD   Cardiologist:   Wende Bushy, MD   Reason for consultation:  Chief Complaint  Patient presents with  . other    Ref by Dr. Marcelline Mates for skipping pounding heart beats and is [redacted] weeks pregnant. Meds reviewed by the pt. verbally.        History of Present Illness: Michelle Ware is a 36 y.o. female who is being seen today for the evaluation of palpitations/heart pounding at the request of [name], MD.  Currently [redacted] weeks pregnant. Few weeks hx of plapitaitons or what she decribes as heart pounding. No CP but symptom is quite concerning to her being pregnant. Symptoms in chest, randomly occurring, seconds at a time, not predictable. SOB which she thinks is weight gain related. No syncope.  No lightheadedness, pnd, orthopnea, edema.   Hx of HTN on HCTZ before pregnancy. BP overall ok, only takes methyldopa 2x a day  ROS:  Please see the history of present illness. Aside from mentioned under HPI, all other systems are reviewed and negative.     Past Medical History:  Diagnosis Date  . AMA (advanced maternal age) multigravida 60+, first trimester   . Dermatophytosis of foot   . Goiter   . Gross hematuria   . Herpes, genital   . Hypertension   . Low grade squamous intraepithelial lesion (LGSIL)   . Morbid obesity with BMI of 40.0-44.9, adult ([place])   . Polycystic ovarian syndrome   . Polycystic ovaries   . Vitamin D deficiency     Past Surgical History:  Procedure Laterality Date  . BIOPSY THYROID  07/2015   Dr. Gabriel Carina  . LAPAROSCOPIC GELPORT ASSISTED MYOMECTOMY N/A 03/11/2015   Procedure: LAPAROSCOPIC  MYOMECTOMY--attempted;  Surgeon: Rubie Maid, MD;  Location: [facility] ORS;  Service: Gynecology;  Laterality: N/A;  . LAPAROTOMY N/A 03/11/2015   Procedure: LAPAROTOMY--MYOMECTOMY;  Surgeon: Rubie Maid, MD;  Location: [facility] ORS;   Service: Gynecology;  Laterality: N/A;  . [place]     reports that she has never smoked. She has never used smokeless tobacco. She reports that she does not drink alcohol or use drugs.   family history includes Hypertension in her mother; Thyroid disease in her mother.   Outpatient Medications Prior to Visit  Medication Sig Dispense Refill  . aspirin EC 81 MG tablet Take 1 tablet (81 mg total) by mouth daily. Take after 12 weeks for prevention of preeclampssia later in pregnancy 300 tablet 2  . methyldopa (ALDOMET) 250 MG tablet Take 1 tablet (250 mg total) by mouth 3 (three) times daily. 90 tablet 1  . Prenatal Multivit-Min-Fe-FA (PRENATAL VITAMINS PO) Take 1 tablet by mouth daily.    . valACYclovir (VALTREX) 500 MG tablet TAKE 1 TABLET BY MOUTH DAILY FOR PREVENTION AND THREE TIMES DAILY FOR OUTBREAKS 40 tablet 5   No facility-administered medications prior to visit.      Allergies: Hydrocodone    PHYSICAL EXAM: VS:  BP 124/60 (BP Location: Right Arm, Patient Position: Sitting, Cuff Size: Large)   Pulse 92   Ht 5\' 10"  (1.778 m)   Wt (!) 310 lb 4 oz (140.7 kg)   LMP 01/13/2016 (Exact Date)   BMI 44.52 kg/m  , Body mass index is 44.52 kg/m. Wt Readings from Last 3 Encounters:  05/12/16 (!) 310 lb 4 oz (140.7 kg)  05/06/16 (!) 307 lb 8 oz (139.5 kg)  04/28/16 (!) 306 lb (138.8 kg)    GENERAL:  well developed, well nourished,  obese, not in acute distress HEENT: normocephalic, pink conjunctivae, anicteric sclerae, no xanthelasma, normal dentition, oropharynx clear NECK:  no neck vein engorgement, JVP normal, no hepatojugular reflux, carotid upstroke brisk and symmetric, no bruit, no thyromegaly, no lymphadenopathy LUNGS:  good respiratory effort, clear to auscultation bilaterally CV:  PMI not displaced, no thrills, no lifts, S1 and S2 within normal limits, no palpable S3 or S4, no murmurs, no rubs, no gallops ABD:  Soft, nontender, nondistended, normoactive bowel  sounds, no abdominal aortic bruit, no hepatomegaly, no splenomegaly MS: nontender back, no kyphosis, no scoliosis, no joint deformities EXT:  2+ DP/PT pulses, no edema, no varicosities, no cyanosis, no clubbing SKIN: warm, nondiaphoretic, normal turgor, no ulcers NEUROPSYCH: alert, oriented to person, place, and time, sensory/motor grossly intact, normal mood, appropriate affect  Recent Labs: 03/06/2016: Platelets 292; TSH 0.453 04/07/2016: ALT 14; BUN 10; Creatinine, Ser 0.81; Potassium 4.4; Sodium 136   Lipid Panel    Component Value Date/Time   CHOL 154 11/22/2012   TRIG 44 11/22/2012   HDL 62 11/22/2012   LDLCALC 83 11/22/2012     Other studies Reviewed:  EKG:  The ekg from 05/12/2016 was personally reviewed by me and it revealed sinus rhythm, 92.  Additional studies/ records that were reviewed personally reviewed by me today include: none available   ASSESSMENT AND PLAN:  Palpitations/heart pounding sob rec holter monitor rec echo rec to keep well hydrated with water  Hypertension Mgmt per OB/primary doctor BP wnl today     Current medicines are reviewed at length with the patient today.  The patient does not have concerns regarding medicines.  Labs/ tests ordered today include:  Orders Placed This Encounter  Procedures  . Holter monitor - 24 hour  . EKG 12-Lead  . ECHOCARDIOGRAM COMPLETE    I had a lengthy and detailed discussion with the patient regarding diagnoses, prognosis, diagnostic options.    Disposition:   FU with Cardiology after tests  Thank you for this consultation. We will forwarding this consultation to referring physician.   Signed, Wende Bushy, MD  05/12/2016 11:51 PM    [place]  This note was generated in part with voice recognition software and I apologize for any typographical errors that were not detected and corrected.

## 2016-05-12 NOTE — Patient Instructions (Addendum)
Testing/Procedures: Your physician has recommended that you wear a holter monitor. Holter monitors are medical devices that record the heart's electrical activity. Doctors most often use these monitors to diagnose arrhythmias. Arrhythmias are problems with the speed or rhythm of the heartbeat. The monitor is a small, portable device. You can wear one while you do your normal daily activities. This is usually used to diagnose what is causing palpitations/syncope (passing out).  Your physician has requested that you have an echocardiogram. Echocardiography is a painless test that uses sound waves to create images of your heart. It provides your doctor with information about the size and shape of your heart and how well your heart's chambers and valves are working. This procedure takes approximately one hour. There are no restrictions for this procedure.    Follow-Up: Your physician recommends that you schedule a follow-up appointment as needed. We will call you with results and if needed schedule follow up at that time.   It was a pleasure seeing you today here in the office. Please do not hesitate to give [place] a call back if you have any further questions. [place], BSN     Holter Monitoring A Holter monitor is a small device that is used to detect abnormal heart rhythms. It clips to your clothing and is connected by wires to flat, sticky disks (electrodes) that attach to your chest. It is worn continuously for 24-48 hours. Follow these instructions at home:  Wear your Holter monitor at all times, even while exercising and sleeping, for as long as directed by your health care provider.  Make sure that the Holter monitor is safely clipped to your clothing or close to your body as recommended by your health care provider.  Do not get the monitor or wires wet.  Do not put body lotion or moisturizer on your chest.  Keep your skin clean.  Keep a diary of your daily  activities, such as walking and doing chores. If you feel that your heartbeat is abnormal or that your heart is fluttering or skipping a beat:  Record what you are doing when it happens.  Record what time of day the symptoms occur.  Return your Holter monitor as directed by your health care provider.  Keep all follow-up visits as directed by your health care provider. This is important. Get help right away if:  You feel lightheaded or you faint.  You have trouble breathing.  You feel pain in your chest, upper arm, or jaw.  You feel sick to your stomach and your skin is pale, cool, or damp.  You heartbeat feels unusual or abnormal. This information is not intended to replace advice given to you by your health care provider. Make sure you discuss any questions you have with your health care provider. Document Released: 10/04/2003 Document Revised: 06/13/2015 Document Reviewed: 08/14/2013 Elsevier Interactive Patient Education  2017 [place]. Echocardiogram An echocardiogram, or echocardiography, uses sound waves (ultrasound) to produce an image of your heart. The echocardiogram is simple, painless, obtained within a short period of time, and offers valuable information to your health care provider. The images from an echocardiogram can provide information such as:  Evidence of coronary artery disease (CAD).  Heart size.  Heart muscle function.  Heart valve function.  Aneurysm detection.  Evidence of a past heart attack.  Fluid buildup around the heart.  Heart muscle thickening.  Assess heart valve function. Tell a health care provider about:  Any allergies you have.  All medicines you are taking, including vitamins, herbs, eye drops, creams, and over-the-counter medicines.  Any problems you or family members have had with anesthetic medicines.  Any blood disorders you have.  Any surgeries you have had.  Any medical conditions you have.  Whether you are  pregnant or may be pregnant. What happens before the procedure? No special preparation is needed. Eat and drink normally. What happens during the procedure?  In order to produce an image of your heart, gel will be applied to your chest and a wand-like tool (transducer) will be moved over your chest. The gel will help transmit the sound waves from the transducer. The sound waves will harmlessly bounce off your heart to allow the heart images to be captured in real-time motion. These images will then be recorded.  You may need an IV to receive a medicine that improves the quality of the pictures. What happens after the procedure? You may return to your normal schedule including diet, activities, and medicines, unless your health care provider tells you otherwise. This information is not intended to replace advice given to you by your health care provider. Make sure you discuss any questions you have with your health care provider. Document Released: 01/03/2000 Document Revised: 08/24/2015 Document Reviewed: 09/12/2012 Elsevier Interactive Patient Education  2017 [employer].

## 2016-05-15 ENCOUNTER — Ambulatory Visit (INDEPENDENT_AMBULATORY_CARE_PROVIDER_SITE_OTHER): Payer: Managed Care, Other (non HMO)

## 2016-05-15 DIAGNOSIS — R002 Palpitations: Secondary | ICD-10-CM

## 2016-05-15 DIAGNOSIS — R0602 Shortness of breath: Secondary | ICD-10-CM

## 2016-05-20 ENCOUNTER — Ambulatory Visit
Admission: RE | Admit: 2016-05-20 | Discharge: 2016-05-20 | Disposition: A | Discharge status: Home / Self Care | Payer: Managed Care, Other (non HMO) | Source: Ambulatory Visit | Attending: Cardiology | Admitting: Cardiology

## 2016-05-20 ENCOUNTER — Ambulatory Visit (INDEPENDENT_AMBULATORY_CARE_PROVIDER_SITE_OTHER): Payer: Managed Care, Other (non HMO) | Admitting: Family Medicine

## 2016-05-20 ENCOUNTER — Encounter: Payer: Self-pay | Admitting: Family Medicine

## 2016-05-20 VITALS — BP 116/68 | HR 108 | Temp 98.0°F | Resp 16 | Ht 70.0 in | Wt 307.7 lb

## 2016-05-20 DIAGNOSIS — J301 Allergic rhinitis due to pollen: Secondary | ICD-10-CM | POA: Diagnosis not present

## 2016-05-20 DIAGNOSIS — R0602 Shortness of breath: Secondary | ICD-10-CM | POA: Insufficient documentation

## 2016-05-20 DIAGNOSIS — R002 Palpitations: Secondary | ICD-10-CM | POA: Diagnosis not present

## 2016-05-20 DIAGNOSIS — M25531 Pain in right wrist: Secondary | ICD-10-CM | POA: Diagnosis not present

## 2016-05-20 DIAGNOSIS — Z3492 Encounter for supervision of normal pregnancy, unspecified, second trimester: Secondary | ICD-10-CM

## 2016-05-20 MED ORDER — FLUTICASONE PROPIONATE 50 MCG/ACT NA SUSP: 2.0000 | Freq: Every day | NASAL | 2 refills | Status: DC

## 2016-05-20 MED ORDER — CETIRIZINE HCL 10 MG PO TABS: 10.0000 mg | ORAL_TABLET | Freq: Every day | ORAL | 11 refills | Status: DC

## 2016-05-20 NOTE — Progress Notes (Signed)
Name: Michelle Ware   MRN: [id number]    DOB: 1980-04-16   Date:05/20/2016       Progress Note  Subjective  Chief Complaint  Chief Complaint  Patient presents with  . Hand Pain  . Allergic Rhinitis     HPI  Right wrist pain: she was at work on April 12 th, 2018 and had to pull a plastic strap and ended up having to use scissors and on the same day the machine she was operating and when it got jammed, she had to pull cardboard boxes that were stuck. She finished the work day, the following day she woke up with mild soreness she went to work for the 2 hours she was scheduled for and did not have problems. The following morning on April 14 th she woke up with severe pain on radial wrist, inability to move but no swelling or redness. She thought symptoms would improve and did not take any medications since she is pregnant.  She was evaluated in our office on 05/06/2016 and advised RICE therapy and to call back if not better. She has not been doing better, she is back to work and tried evaluation by [employer] but since it was taking too long she decided to come in today.   AR: she was outdoors this past weekend and has developed rhinorrhea, nasal congestion, and mild dry cough, no itching or fever, no sob or wheezing, she has been taking Zyrtec over the past few days  Patient Active Problem List   Diagnosis Date Noted  . Heart palpitations 04/28/2016  . Pre-existing essential hypertension during pregnancy in second trimester 04/28/2016  . Encounter for supervision of high risk multigravida of advanced maternal age, antepartum 03/31/2016  . Morbid obesity with BMI of 40.0-44.9, adult ([place]) 03/31/2016  . AMA (advanced maternal age) multigravida 42+, first trimester   . Thyroid nodule 07/07/2015  . H/O myomectomy 03/11/2015  . Essential hypertension 11/28/2014  . Obesity, Class III, BMI 40-49.9 (morbid obesity) ([place]) 11/28/2014  . Decreased thyroid stimulating hormone (TSH) level 07/24/2014   . Hypertrichosis 07/24/2014  . Vitamin D deficiency 07/24/2014  . Thyromegaly 07/24/2014  . PCOS (polycystic ovarian syndrome) 07/24/2014  . History of abnormal cervical Pap smear 11/24/2011  . Genital herpes 11/29/2008    Past Surgical History:  Procedure Laterality Date  . BIOPSY THYROID  07/2015   Dr. Gabriel Carina  . LAPAROSCOPIC GELPORT ASSISTED MYOMECTOMY N/A 03/11/2015   Procedure: LAPAROSCOPIC  MYOMECTOMY--attempted;  Surgeon: Rubie Maid, MD;  Location: [facility] ORS;  Service: Gynecology;  Laterality: N/A;  . LAPAROTOMY N/A 03/11/2015   Procedure: LAPAROTOMY--MYOMECTOMY;  Surgeon: Rubie Maid, MD;  Location: [facility] ORS;  Service: Gynecology;  Laterality: N/A;  . MOUTH SURGERY  1996    Family History  Problem Relation Age of Onset  . Thyroid disease Mother   . Hypertension Mother     Social History   Social History  . Marital status: Single    Spouse name: N/A  . Number of children: 0  . Years of education: N/A   Occupational History  . 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 Smoking status: Never Smoker  . Smokeless tobacco: Never Used  . Alcohol use No  . Drug use: No  . Sexual activity: Yes    Partners: Male    Birth control/ protection: None   Other Topics Concern  . Not on file   Social History  Narrative   Lives by herself, dating Carloyn Manner since 2015.      Current Outpatient Prescriptions:  .  aspirin EC 81 MG tablet, Take 1 tablet (81 mg total) by mouth daily. Take after 12 weeks for prevention of preeclampssia later in pregnancy, Disp: 300 tablet, Rfl: 2 .  methyldopa (ALDOMET) 250 MG tablet, Take 1 tablet (250 mg total) by mouth 3 (three) times daily. (Patient taking differently: Take 250 mg by mouth 2 (two) times daily. ), Disp: 90 tablet, Rfl: 1 .  Prenatal Multivit-Min-Fe-FA (PRENATAL VITAMINS PO), Take 1 tablet by mouth daily., Disp: , Rfl:  .  valACYclovir (VALTREX) 500 MG tablet, TAKE 1 TABLET BY MOUTH  DAILY FOR PREVENTION AND THREE TIMES DAILY FOR OUTBREAKS, Disp: 40 tablet, Rfl: 5 .  cetirizine (ZYRTEC) 10 MG tablet, Take 1 tablet (10 mg total) by mouth daily., Disp: 30 tablet, Rfl: 11 .  fluticasone (FLONASE) 50 MCG/ACT nasal spray, Place 2 sprays into both nostrils daily., Disp: 16 g, Rfl: 2  Allergies  Allergen Reactions  . Hydrocodone Itching     ROS  Constitutional: Negative for fever or weight change.  Respiratory: Negative for cough and shortness of breath.   Cardiovascular: Negative for chest pain or palpitations.  Gastrointestinal: Negative for abdominal pain, no bowel changes.  Musculoskeletal: Negative for gait problem or joint swelling.  Skin: Negative for rash.  Neurological: Negative for dizziness or headache.  No other specific complaints in a complete review of systems (except as listed in HPI above).   Objective  Vitals:   05/20/16 1434  BP: 116/68  Pulse: (!) 108  Resp: 16  Temp: 98 F (36.7 C)  TempSrc: Oral  SpO2: 98%  Weight: (!) 307 lb 11.2 oz (139.6 kg)  Height: '5\' 10"'  (1.778 m)    Body mass index is 44.15 kg/m.  Physical Exam  Constitutional: Patient appears well-developed and well-nourished. Obese  No distress.  HEENT: head atraumatic, normocephalic, pupils equal and reactive to light, boggy turbinates neck supple, throat within normal limits Cardiovascular: Normal rate, regular rhythm and normal heart sounds.  No murmur heard. No BLE edema. Pulmonary/Chest: Effort normal and breath sounds normal. No respiratory distress. Abdominal: Soft.  There is no tenderness. Psychiatric: Patient has a normal mood and affect. behavior is normal. Judgment and thought content normal. Muscular Skeletal: she has pain on radial wrist, worse with ulnar deviation with flexed thumb. No redness or increase in warmth, no swelling.   Recent Results (from the past 2160 hour(s))  Culture, OB Urine     Status: None   Collection Time: 03/02/16  4:44 PM  Result  Value Ref Range   Urine Culture, OB Final report   GC/Chlamydia Probe Amp     Status: None   Collection Time: 03/02/16  4:44 PM  Result Value Ref Range   Chlamydia trachomatis, NAA Negative Negative   Neisseria gonorrhoeae by PCR Negative Negative  Monitor Drug Profile 14(MW)     Status: None   Collection Time: 03/02/16  4:44 PM  Result Value Ref Range   AMPHETAMINE SCREEN URINE Negative Cutoff=1000 ng/mL   BARBITURATE SCREEN URINE Negative Cutoff=200 ng/mL   BENZODIAZEPINE SCREEN, URINE Negative Cutoff=200 ng/mL   CANNABINOIDS UR QL SCN Negative Cutoff=20 ng/mL   COCAINE(METAB.)SCREEN, URINE Negative Cutoff=300 ng/mL   OPIATE SCREEN URINE Negative Cutoff=300 ng/mL    Comment: Opiate test includes Codeine, Morphine, Hydromorphone, Hydrocodone.   OXYCODONE+OXYMORPHONE UR QL SCN Negative Cutoff=100 ng/mL    Comment: Test includes Oxycodone and Oxymorphone  PHENCYCLIDINE QUANTITATIVE URINE Negative Cutoff=25 ng/mL   Methadone Screen, Urine Negative Cutoff=300 ng/mL   PROPOXYPHENE, SCREEN Negative Cutoff=300 ng/mL   Meperidine Screen, Urine Negative Cutoff=200 ng/mL    Comment: This test was developed and its performance characteristics determined by [facility]. It has not been cleared or approved by the Food and Drug Administration.    Tramadol Screen, Urine Negative Cutoff=200 ng/mL   Fentanyl, Urine Negative Cutoff=2000 pg/mL    Comment: Test includes Fentanyl and Norfentanyl This test was developed and its performance characteristics determined by [facility]. It has not been cleared or approved by the Food and Drug Administration.    Buprenorphine, Urine Negative Cutoff=10 ng/mL   Creatinine(Crt), U 256.8 20.0 - 300.0 mg/dL   SPECIFIC GRAVITY 1.026    Ph of Urine 6.1 4.5 - 8.9   Please Note: Comment     Comment: Drug-test results should be interpreted in the context of clinical information. Patient metabolic variables, specific drug chemistry, and specimen characteristics can  affect test outcome. Technical consultation is available if a test result is inconsistent with an expected outcome. ([email or web address] or call toll-free [phone]) Drug brands, if listed herein, are trademarks of their respective owners.   Nicotine screen, urine     Status: None   Collection Time: 03/02/16  4:44 PM  Result Value Ref Range   COTININE UR QL SCN Negative Cutoff=300 ng/mL   Drug Screen Comment: Comment     Comment: This assay provides a preliminary unconfirmed analytical test result that may be suitable for the clinical management of patients in certain situations.  For workplace drug testing programs, preliminary positive findings should always be confirmed by an alternative method. Some over-the-counter medications, as well as adulterants, may cause inaccurate results. Screen Only testing does not meet the [employer] of [employer] Forensic Urine Drug Testing Program requirements as a forensic urine drug test for workplace testing. All clients must ensure that their testing program conforms to applicable state and federal laws and employment agreements.   Urinalysis, Routine w reflex microscopic     Status: Abnormal   Collection Time: 03/02/16  4:44 PM  Result Value Ref Range   Specific Gravity, UA      >=1.030 (A) 1.005 - 1.030   pH, UA 6.5 5.0 - 7.5   Color, UA Yellow Yellow   Appearance Ur Clear Clear   Leukocytes, UA Negative Negative   Protein, UA Negative Negative/Trace   Glucose, UA Negative Negative   Ketones, UA Trace (A) Negative   RBC, UA Negative Negative   Bilirubin, UA Negative Negative   Urobilinogen, Ur 0.2 0.2 - 1.0 mg/dL   Nitrite, UA Negative Negative   Microscopic Examination Comment     Comment: Microscopic not indicated and not performed.  Result     Status: None   Collection Time: 03/02/16  4:44 PM  Result Value Ref Range   Result 1 Comment     Comment: Mixed urogenital flora 10,000-25,000 colony forming units  per mL   ABO     Status: None   Collection Time: 03/06/16  3:48 PM  Result Value Ref Range   ABO Grouping O   Antibody screen     Status: None   Collection Time: 03/06/16  3:48 PM  Result Value Ref Range   Antibody Screen Negative Negative  CBC with Differential/Platelet     Status: Abnormal   Collection Time: 03/06/16  3:48 PM  Result Value Ref Range   WBC 7.7 3.4 - 10.8 x10E3/uL  RBC 4.03 3.77 - 5.28 x10E6/uL   Hemoglobin 10.0 (L) 11.1 - 15.9 g/dL   Hematocrit 31.1 (L) 34.0 - 46.6 %   MCV 77 (L) 79 - 97 fL   MCH 24.8 (L) 26.6 - 33.0 pg   MCHC 32.2 31.5 - 35.7 g/dL   RDW 15.9 (H) 12.3 - 15.4 %   Platelets 292 150 - 379 x10E3/uL   Neutrophils 58 Not Estab. %   Lymphs 34 Not Estab. %   Monocytes 7 Not Estab. %   Eos 1 Not Estab. %   Basos 0 Not Estab. %   Neutrophils Absolute 4.4 1.4 - 7.0 x10E3/uL   Lymphocytes Absolute 2.6 0.7 - 3.1 x10E3/uL   Monocytes Absolute 0.5 0.1 - 0.9 x10E3/uL   EOS (ABSOLUTE) 0.1 0.0 - 0.4 x10E3/uL   Basophils Absolute 0.0 0.0 - 0.2 x10E3/uL   Immature Granulocytes 0 Not Estab. %   Immature Grans (Abs) 0.0 0.0 - 0.1 x10E3/uL  Hemoglobin A1c     Status: None   Collection Time: 03/06/16  3:48 PM  Result Value Ref Range   Hgb A1c MFr Bld 5.3 4.8 - 5.6 %    Comment:          Pre-diabetes: 5.7 - 6.4          Diabetes: >6.4          Glycemic control for adults with diabetes: <7.0    Est. average glucose Bld gHb Est-mCnc 105 mg/dL  Hepatitis B surface antigen     Status: None   Collection Time: 03/06/16  3:48 PM  Result Value Ref Range   Hepatitis B Surface Ag Negative Negative  HIV antibody     Status: None   Collection Time: 03/06/16  3:48 PM  Result Value Ref Range   HIV Screen 4th Generation wRfx Non Reactive Non Reactive  Rh Type     Status: None   Collection Time: 03/06/16  3:48 PM  Result Value Ref Range   Rh Factor Positive     Comment: Please note: Prior records for this patient's ABO / Rh type are not available for additional  verification.   RPR     Status: None   Collection Time: 03/06/16  3:48 PM  Result Value Ref Range   RPR Ser Ql Non Reactive Non Reactive  Rubella screen     Status: None   Collection Time: 03/06/16  3:48 PM  Result Value Ref Range   Rubella Antibodies, IGG 1.60 Immune >0.99 index    Comment:                                 Non-immune       <0.90                                 Equivocal  0.90 - 0.99                                 Immune           >0.99   TSH     Status: None   Collection Time: 03/06/16  3:48 PM  Result Value Ref Range   TSH 0.453 0.450 - 4.500 uIU/mL  Varicella zoster antibody, IgG     Status: None   Collection Time:  03/06/16  3:48 PM  Result Value Ref Range   Varicella zoster IgG 2,568 Immune >165 index    Comment:                                Negative          <135                                Equivocal    135 - 165                                Positive          >165 A positive result generally indicates exposure to the pathogen or administration of specific immunoglobulins, but it is not indication of active infection or stage of disease.   Sickle cell screen     Status: None   Collection Time: 03/06/16  3:48 PM  Result Value Ref Range   Sickle Cell Screen Negative Negative    Comment: Since a variety of conditions and other abnormal hemoglobins in addition to Hemoglobin S may give false-positive results, positive Hemoglobin Solubility tests should be confirmed by hemoglobin fractionation testing.   POCT urinalysis dipstick     Status: Abnormal   Collection Time: 03/31/16  4:36 PM  Result Value Ref Range   Color, UA dark yellow    Clarity, UA clear    Glucose, UA neg    Bilirubin, UA neg    Ketones, UA neg    Spec Grav, UA 1.030 1.003, 1.005, 1.010, 1.015, 1.020, 1.025, 1.030, 1.035   Blood, UA NHT    pH, UA 6.5 5.0, 5.5, 6.0, 6.5, 7.0, 7.5, 8.0   Protein, UA trace    Urobilinogen, UA negative (A) 0.2, 1.0   Nitrite, UA neg     Leukocytes, UA large (3+) (A) Negative  Comp Met (CMET)     Status: Abnormal   Collection Time: 04/07/16  5:11 PM  Result Value Ref Range   Glucose 84 65 - 99 mg/dL   BUN 10 6 - 20 mg/dL   Creatinine, Ser 0.81 0.57 - 1.00 mg/dL   GFR calc non Af Amer 92 >59 mL/min/1.73   GFR calc Af Amer 107 >59 mL/min/1.73   BUN/Creatinine Ratio 12 9 - 23   Sodium 136 134 - 144 mmol/L   Potassium 4.4 3.5 - 5.2 mmol/L   Chloride 101 96 - 106 mmol/L   CO2 21 18 - 29 mmol/L   Calcium 9.3 8.7 - 10.2 mg/dL   Total Protein 6.8 6.0 - 8.5 g/dL   Albumin 4.2 3.5 - 5.5 g/dL   Globulin, Total 2.6 1.5 - 4.5 g/dL   Albumin/Globulin Ratio 1.6 1.2 - 2.2   Bilirubin Total <0.2 0.0 - 1.2 mg/dL   Alkaline Phosphatase 30 (L) 39 - 117 IU/L   AST 19 0 - 40 IU/L   ALT 14 0 - 32 IU/L  Protein / creatinine ratio, urine     Status: None   Collection Time: 04/07/16  5:11 PM  Result Value Ref Range   Creatinine, Urine 212.5 Not Estab. mg/dL   Protein, Ur 16.2 Not Estab. mg/dL   Protein/Creat Ratio 76 0 - 200 mg/g creat  POCT urinalysis dipstick     Status: Abnormal   Collection Time: 04/28/16  3:53 PM  Result Value Ref Range   Color, UA yellow    Clarity, UA cloudy    Glucose, UA neg    Bilirubin, UA small    Ketones, UA 5    Spec Grav, UA 1.015 1.030 - 1.035   Blood, UA neg    pH, UA 6.5 5.0 - 8.0   Protein, UA trace    Urobilinogen, UA 0.2 Negative - 2.0   Nitrite, UA neg    Leukocytes, UA large (3+) (A) Negative  AFP, Serum, Open Spina Bifida     Status: None   Collection Time: 04/28/16  4:42 PM  Result Value Ref Range   Results Report    Test Results: *Screen Negative*    Gest. Age on Collection Date 15.1 weeks   Gestat. Age Based On EDD     Comment:                               10/19/2016 Recalculations are not recommended when gestational dating by LMP and ultrasound are within 10 days.    Maternal Age [age] EDD 38.5 yr   Race Black    Weight 306 lbs   Insulin Dep Diabetes No    Multiple  Gestation No    AFP Value 21.5 ng/mL   AFP MoM 1.04    OSBR Risk       1 IN 10,000    Interpretation Comment     Comment: Interpretation: Screen Negative This result is screen negative for fetal OSB. The AFP MoM calculated is based on the gestational age provided. MS-AFP can identify up to 80% of open fetal neural tube defects. Closed neural tube defects and some open defects may not be detected by this test. This test does not screen for fetal Down Syndrome or Trisomy 18. If screening for Down Syndrome or Trisomy 18 is desired, Print production planner to discuss available options.  The [employer] of [employer]stetricians and Gynecologists recommends amniocentesis be offered to women age 33 and older.    Comment: Comment     Comment: Driscilla Moats, Ph.D., [place] Technical Director References: Available Upon Request. Multiples Of Median Cutoffs         For AFP Elevations Sing[place]   2.5           Black    2.8 IDD         2.0           Twins    4.5         Abbreviation Definitions IDD - Insulin Dep Diabetes OSBR - Open Spina Bifida Risk For further inquiries contact [place] at [phone].   Glucose, 1 hour gestational     Status: None   Collection Time: 04/28/16  4:52 PM  Result Value Ref Range   Gestational Diabetes Screen 98 65 - 139 mg/dL    Comment: According to ADA, a glucose threshold of >139 mg/dL after 50-gram load identifies approximately 80% of women with gestational diabetes mellitus, while the sensitivity is further increased to approximately 90% by a threshold of >129 mg/dL.       PHQ2/9: Depression screen [facility] 2/9 02/25/2016 12/20/2015 05/23/2015 10/17/2014 07/24/2014  Decreased Interest 0 0 0 0 0  Down, Depressed, Hopeless 0 0 0 0 0  PHQ - 2 Score 0 0 0 0 0     Fall Risk: Fall Risk  02/25/2016 12/20/2015 05/23/2015 10/17/2014  07/24/2014  Falls in the past year? No No No No No     Assessment & Plan  1. Right  wrist pain  - Ambulatory referral to Orthopedic Surgery Likely DeQuervain's tenosynovitis likely from recent injury at work.   2. Seasonal allergic rhinitis due to pollen  - cetirizine (ZYRTEC) 10 MG tablet; Take 1 tablet (10 mg total) by mouth daily.  Dispense: 30 tablet; Refill: 11 - fluticasone (FLONASE) 50 MCG/ACT nasal spray; Place 2 sprays into both nostrils daily.  Dispense: 16 g; Refill: 2  3. Pregnant and not yet delivered in second trimester  We will avoid nsaid's unless if topical  May need steroid injection or brace

## 2016-05-21 ENCOUNTER — Other Ambulatory Visit: Payer: Self-pay | Admitting: Family Medicine

## 2016-05-21 DIAGNOSIS — I10 Essential (primary) hypertension: Secondary | ICD-10-CM

## 2016-05-21 DIAGNOSIS — M659 Synovitis and tenosynovitis, unspecified: Secondary | ICD-10-CM | POA: Insufficient documentation

## 2016-05-21 DIAGNOSIS — Z3201 Encounter for pregnancy test, result positive: Secondary | ICD-10-CM

## 2016-05-21 NOTE — Telephone Encounter (Signed)
Patient requesting refill of Methyldopa to [facility].  

## 2016-05-26 ENCOUNTER — Ambulatory Visit (INDEPENDENT_AMBULATORY_CARE_PROVIDER_SITE_OTHER): Payer: Managed Care, Other (non HMO)

## 2016-05-26 ENCOUNTER — Other Ambulatory Visit: Payer: Self-pay | Admitting: Obstetrics and Gynecology

## 2016-05-26 ENCOUNTER — Encounter: Payer: Self-pay | Admitting: Obstetrics and Gynecology

## 2016-05-26 ENCOUNTER — Ambulatory Visit (INDEPENDENT_AMBULATORY_CARE_PROVIDER_SITE_OTHER): Payer: Managed Care, Other (non HMO) | Admitting: Obstetrics and Gynecology

## 2016-05-26 VITALS — BP 131/88 | HR 90 | Wt 308.2 lb

## 2016-05-26 DIAGNOSIS — Z6841 Body Mass Index (BMI) 40.0 and over, adult: Secondary | ICD-10-CM

## 2016-05-26 DIAGNOSIS — O09529 Supervision of elderly multigravida, unspecified trimester: Secondary | ICD-10-CM

## 2016-05-26 DIAGNOSIS — Z3492 Encounter for supervision of normal pregnancy, unspecified, second trimester: Secondary | ICD-10-CM

## 2016-05-26 LAB — POCT URINALYSIS DIPSTICK
BILIRUBIN UA: NEGATIVE
Glucose, UA: NEGATIVE
Leukocytes, UA: NEGATIVE
NITRITE UA: NEGATIVE
PH UA: 6.5 (ref 5.0–8.0)
PROTEIN UA: NEGATIVE
RBC UA: NEGATIVE
Spec Grav, UA: 1.02 (ref 1.010–1.025)
Urobilinogen, UA: 0.2 E.U./dL

## 2016-05-26 NOTE — Progress Notes (Signed)
ROB:  Had FAS today - needs additional views in 2 wks.  Boy!.  Aldomet BID (HTN controlled) and ASA as directed.  2nd Panarama not enough sample - will do Quad screen today (discussed with pt).  Pt eating and drinking without problem.  2lb wt gain.,

## 2016-05-29 LAB — AFP, QUAD SCREEN
DIA Mom Value: 1.56
DIA Value (EIA): 189.61 pg/mL
DSR (BY AGE) 1 IN: 130
DSR (SECOND TRIMESTER) 1 IN: 284
GESTATIONAL AGE AFP: 19.1 wk
MSAFP MOM: 1.06
MSAFP: 38.5 ng/mL
MSHCG Mom: 1.02
MSHCG: 16493 m[IU]/mL
Maternal Age At EDD: 38.5 yr
Osb Risk: 10000
TEST RESULTS AFP: NEGATIVE
UE3 MOM: 0.91
WEIGHT: 308 [lb_av]
uE3 Value: 1.2 ng/mL

## 2016-06-05 ENCOUNTER — Other Ambulatory Visit: Payer: Self-pay | Admitting: Obstetrics and Gynecology

## 2016-06-05 DIAGNOSIS — IMO0002 Reserved for concepts with insufficient information to code with codable children: Secondary | ICD-10-CM

## 2016-06-05 DIAGNOSIS — Z0489 Encounter for examination and observation for other specified reasons: Secondary | ICD-10-CM

## 2016-06-08 ENCOUNTER — Telehealth: Payer: Self-pay | Admitting: Obstetrics and Gynecology

## 2016-06-08 NOTE — Telephone Encounter (Signed)
Responded to pt via mychart

## 2016-06-08 NOTE — Telephone Encounter (Signed)
Patient is having a discomfort - not really pain - its a weird feeling in her lower abdomen She is 21 weeks and wants to know if this is normal  She has an appointment for an [place] tomorrow  Please communicate with her thru MyChart

## 2016-06-09 ENCOUNTER — Ambulatory Visit (INDEPENDENT_AMBULATORY_CARE_PROVIDER_SITE_OTHER): Payer: Managed Care, Other (non HMO)

## 2016-06-09 DIAGNOSIS — Z0489 Encounter for examination and observation for other specified reasons: Secondary | ICD-10-CM

## 2016-06-09 DIAGNOSIS — Z048 Encounter for examination and observation for other specified reasons: Secondary | ICD-10-CM | POA: Diagnosis not present

## 2016-06-09 DIAGNOSIS — IMO0002 Reserved for concepts with insufficient information to code with codable children: Secondary | ICD-10-CM

## 2016-06-16 ENCOUNTER — Other Ambulatory Visit: Payer: Managed Care, Other (non HMO)

## 2016-06-23 ENCOUNTER — Ambulatory Visit (INDEPENDENT_AMBULATORY_CARE_PROVIDER_SITE_OTHER): Payer: Managed Care, Other (non HMO) | Admitting: Obstetrics and Gynecology

## 2016-06-23 VITALS — BP 118/71 | HR 92 | Wt 310.4 lb

## 2016-06-23 DIAGNOSIS — Z6841 Body Mass Index (BMI) 40.0 and over, adult: Secondary | ICD-10-CM

## 2016-06-23 DIAGNOSIS — O09521 Supervision of elderly multigravida, first trimester: Secondary | ICD-10-CM

## 2016-06-23 DIAGNOSIS — I10 Essential (primary) hypertension: Secondary | ICD-10-CM

## 2016-06-23 DIAGNOSIS — O09529 Supervision of elderly multigravida, unspecified trimester: Secondary | ICD-10-CM

## 2016-06-23 LAB — POCT URINALYSIS DIPSTICK
Bilirubin, UA: NEGATIVE
Glucose, UA: NEGATIVE
Ketones, UA: NEGATIVE
NITRITE UA: NEGATIVE
RBC UA: NEGATIVE
SPEC GRAV UA: 1.025 (ref 1.010–1.025)
UROBILINOGEN UA: 0.2 U/dL
pH, UA: 6 (ref 5.0–8.0)

## 2016-06-23 NOTE — Progress Notes (Signed)
ROB: Patient notes vaginal pressure at times.  Notes fetal movement. Reports learning that her uncle passed while in the waiting room.  Offered condolences.  RTC in 4 weeks, for repeat glucola at that time.

## 2016-06-23 NOTE — Patient Instructions (Signed)
How a Baby Grows During Pregnancy Pregnancy begins when a female's sperm enters a female's egg (fertilization). This happens in one of the tubes (fallopian tubes) that connect the ovaries to the womb (uterus). The fertilized egg is called an embryo until it reaches 10 weeks. From 10 weeks until birth, it is called a fetus. The fertilized egg moves down the fallopian tube to the uterus. Then it implants into the lining of the uterus and begins to grow. The developing fetus receives oxygen and nutrients through the pregnant woman's bloodstream and the tissues that grow (placenta) to support the fetus. The placenta is the life support system for the fetus. It provides nutrition and removes waste. Learning as much as you can about your pregnancy and how your baby is developing can help you enjoy the experience. It can also make you aware of when there might be a problem and when to ask questions. How long does a typical pregnancy last? A pregnancy usually lasts 280 days, or about 40 weeks. Pregnancy is divided into three trimesters:  First trimester: 0-13 weeks.  Second trimester: 14-27 weeks.  Third trimester: 28-40 weeks.  The day when your baby is considered ready to be born (full term) is your estimated date of delivery. How does my baby develop month by month? First month  The fertilized egg attaches to the inside of the uterus.  Some cells will form the placenta. Others will form the fetus.  The arms, legs, brain, spinal cord, lungs, and heart begin to develop.  At the end of the first month, the heart begins to beat.  Second month  The bones, inner ear, eyelids, hands, and feet form.  The genitals develop.  By the end of 8 weeks, all major organs are developing.  Third month  All of the internal organs are forming.  Teeth develop below the gums.  Bones and muscles begin to grow. The spine can flex.  The skin is transparent.  Fingernails and toenails begin to form.  Arms  and legs continue to grow longer, and hands and feet develop.  The fetus is about 3 in (7.6 cm) long.  Fourth month  The placenta is completely formed.  The external sex organs, neck, outer ear, eyebrows, eyelids, and fingernails are formed.  The fetus can hear, swallow, and move its arms and legs.  The kidneys begin to produce urine.  The skin is covered with a white waxy coating (vernix) and very fine hair (lanugo).  Fifth month  The fetus moves around more and can be felt for the first time (quickening).  The fetus starts to sleep and wake up and may begin to suck its finger.  The nails grow to the end of the fingers.  The organ in the digestive system that makes bile (gallbladder) functions and helps to digest the nutrients.  If your baby is a girl, eggs are present in her ovaries. If your baby is a boy, testicles start to move down into his scrotum.  Sixth month  The lungs are formed, but the fetus is not yet able to breathe.  The eyes open. The brain continues to develop.  Your baby has fingerprints and toe prints. Your baby's hair grows thicker.  At the end of the second trimester, the fetus is about 9 in (22.9 cm) long.  Seventh month  The fetus kicks and stretches.  The eyes are developed enough to sense changes in light.  The hands can make a grasping motion.  The  fetus responds to sound.  Eighth month  All organs and body systems are fully developed and functioning.  Bones harden and taste buds develop. The fetus may hiccup.  Certain areas of the brain are still developing. The skull remains soft.  Ninth month  The fetus gains about  lb (0.23 kg) each week.  The lungs are fully developed.  Patterns of sleep develop.  The fetus's head typically moves into a head-down position (vertex) in the uterus to prepare for birth. If the buttocks move into a vertex position instead, the baby is breech.  The fetus weighs 6-9 lbs (2.72-4.08 kg) and is  19-20 in (48.26-50.8 cm) long.  What can I do to have a healthy pregnancy and help my baby develop? Eating and Drinking  Eat a healthy diet. ? Talk with your health care provider to make sure that you are getting the nutrients that you and your baby need. ? Visit www.choosemyplate.gov to learn about creating a healthy diet.  Gain a healthy amount of weight during pregnancy as advised by your health care provider. This is usually 25-35 pounds. You may need to: ? Gain more if you were underweight before getting pregnant or if you are pregnant with more than one baby. ? Gain less if you were overweight or obese when you got pregnant.  Medicines and Vitamins  Take prenatal vitamins as directed by your health care provider. These include vitamins such as folic acid, iron, calcium, and vitamin D. They are important for healthy development.  Take medicines only as directed by your health care provider. Read labels and ask a pharmacist or your health care provider whether over-the-counter medicines, supplements, and prescription drugs are safe to take during pregnancy.  Activities  Be physically active as advised by your health care provider. Ask your health care provider to recommend activities that are safe for you to do, such as walking or swimming.  Do not participate in strenuous or extreme sports.  Lifestyle  Do not drink alcohol.  Do not use any tobacco products, including cigarettes, chewing tobacco, or electronic cigarettes. If you need help quitting, ask your health care provider.  Do not use illegal drugs.  Safety  Avoid exposure to mercury, lead, or other heavy metals. Ask your health care provider about common sources of these heavy metals.  Avoid listeria infection during pregnancy. Follow these precautions: ? Do not eat soft cheeses or deli meats. ? Do not eat hot dogs unless they have been warmed up to the point of steaming, such as in the microwave oven. ? Do not  drink unpasteurized milk.  Avoid toxoplasmosis infection during pregnancy. Follow these precautions: ? Do not change your cat's litter box, if you have a cat. Ask someone else to do this for you. ? Wear gardening gloves while working in the yard.  General Instructions  Keep all follow-up visits as directed by your health care provider. This is important. This includes prenatal care and screening tests.  Manage any chronic health conditions. Work closely with your health care provider to keep conditions, such as diabetes, under control.  How do I know if my baby is developing well? At each prenatal visit, your health care provider will do several different tests to check on your health and keep track of your baby's development. These include:  Fundal height. ? Your health care provider will measure your growing belly from top to bottom using a tape measure. ? Your health care provider will also feel your belly   to determine your baby's position.  Heartbeat. ? An ultrasound in the first trimester can confirm pregnancy and show a heartbeat, depending on how far along you are. ? Your health care provider will check your baby's heart rate at every prenatal visit. ? As you get closer to your delivery date, you may have regular fetal heart rate monitoring to make sure that your baby is not in distress.  Second trimester ultrasound. ? This ultrasound checks your baby's development. It also indicates your baby's gender.  What should I do if I have concerns about my baby's development? Always talk with your health care provider about any concerns that you may have. This information is not intended to replace advice given to you by your health care provider. Make sure you discuss any questions you have with your health care provider. Document Released: 06/24/2007 Document Revised: 06/13/2015 Document Reviewed: 06/14/2013 Elsevier Interactive Patient Education  2018 [place]  of Pregnancy The second trimester is from week 14 through week 27 (months 4 through 6). The second trimester is often a time when you feel your best. Your body has adjusted to being pregnant, and you begin to feel better physically. Usually, morning sickness has lessened or quit completely, you may have more energy, and you may have an increase in appetite. The second trimester is also a time when the fetus is growing rapidly. At the end of the sixth month, the fetus is about 9 inches long and weighs about 1 pounds. You will likely begin to feel the baby move (quickening) between 16 and 20 weeks of pregnancy. Body changes during your second trimester Your body continues to go through many changes during your second trimester. The changes vary from woman to woman.  Your weight will continue to increase. You will notice your lower abdomen bulging out.  You may begin to get stretch marks on your hips, abdomen, and breasts.  You may develop headaches that can be relieved by medicines. The medicines should be approved by your health care provider.  You may urinate more often because the fetus is pressing on your bladder.  You may develop or continue to have heartburn as a result of your pregnancy.  You may develop constipation because certain hormones are causing the muscles that push waste through your intestines to slow down.  You may develop hemorrhoids or swollen, bulging veins (varicose veins).  You may have back pain. This is caused by: ? Weight gain. ? Pregnancy hormones that are relaxing the joints in your pelvis. ? A shift in weight and the muscles that support your balance.  Your breasts will continue to grow and they will continue to become tender.  Your gums may bleed and may be sensitive to brushing and flossing.  Dark spots or blotches (chloasma, mask of pregnancy) may develop on your face. This will likely fade after the baby is born.  A dark line from your belly button to  the pubic area (linea nigra) may appear. This will likely fade after the baby is born.  You may have changes in your hair. These can include thickening of your hair, rapid growth, and changes in texture. Some women also have hair loss during or after pregnancy, or hair that feels dry or thin. Your hair will most likely return to normal after your baby is born.  What to expect at prenatal visits During a routine prenatal visit:  You will be weighed to make sure you and the fetus are growing  normally.  Your blood pressure will be taken.  Your abdomen will be measured to track your baby's growth.  The fetal heartbeat will be listened to.  Any test results from the previous visit will be discussed.  Your health care provider may ask you:  How you are feeling.  If you are feeling the baby move.  If you have had any abnormal symptoms, such as leaking fluid, bleeding, severe headaches, or abdominal cramping.  If you are using any tobacco products, including cigarettes, chewing tobacco, and electronic cigarettes.  If you have any questions.  Other tests that may be performed during your second trimester include:  Blood tests that check for: ? Low iron levels (anemia). ? High blood sugar that affects pregnant women (gestational diabetes) between 36 and 28 weeks. ? Rh antibodies. This is to check for a protein on red blood cells (Rh factor).  Urine tests to check for infections, diabetes, or protein in the urine.  An ultrasound to confirm the proper growth and development of the baby.  An amniocentesis to check for possible genetic problems.  Fetal screens for spina bifida and Down syndrome.  HIV (human immunodeficiency virus) testing. Routine prenatal testing includes screening for HIV, unless you choose not to have this test.  Follow these instructions at home: Medicines  Follow your health care provider's instructions regarding medicine use. Specific medicines may be either  safe or unsafe to take during pregnancy.  Take a prenatal vitamin that contains at least 600 micrograms (mcg) of folic acid.  If you develop constipation, try taking a stool softener if your health care provider approves. Eating and drinking  Eat a balanced diet that includes fresh fruits and vegetables, whole grains, good sources of protein such as meat, eggs, or tofu, and low-fat dairy. Your health care provider will help you determine the amount of weight gain that is right for you.  Avoid raw meat and uncooked cheese. These carry germs that can cause birth defects in the baby.  If you have low calcium intake from food, talk to your health care provider about whether you should take a daily calcium supplement.  Limit foods that are high in fat and processed sugars, such as fried and sweet foods.  To prevent constipation: ? Drink enough fluid to keep your urine clear or pale yellow. ? Eat foods that are high in fiber, such as fresh fruits and vegetables, whole grains, and beans. Activity  Exercise only as directed by your health care provider. Most women can continue their usual exercise routine during pregnancy. Try to exercise for 30 minutes at least 5 days a week. Stop exercising if you experience uterine contractions.  Avoid heavy lifting, wear low heel shoes, and practice good posture.  A sexual relationship may be continued unless your health care provider directs you otherwise. Relieving pain and discomfort  Wear a good support bra to prevent discomfort from breast tenderness.  Take warm sitz baths to soothe any pain or discomfort caused by hemorrhoids. Use hemorrhoid cream if your health care provider approves.  Rest with your legs elevated if you have leg cramps or low back pain.  If you develop varicose veins, wear support hose. Elevate your feet for 15 minutes, 3-4 times a day. Limit salt in your diet. Prenatal Care  Write down your questions. Take them to your  prenatal visits.  Keep all your prenatal visits as told by your health care provider. This is important. Safety  Wear your seat belt at  all times when driving.  Make a list of emergency phone numbers, including numbers for family, friends, the hospital, and police and fire departments. General instructions  Ask your health care provider for a referral to a local prenatal education class. Begin classes no later than the beginning of month 6 of your pregnancy.  Ask for help if you have counseling or nutritional needs during pregnancy. Your health care provider can offer advice or refer you to specialists for help with various needs.  Do not use hot tubs, steam rooms, or saunas.  Do not douche or use tampons or scented sanitary pads.  Do not cross your legs for long periods of time.  Avoid cat litter boxes and soil used by cats. These carry germs that can cause birth defects in the baby and possibly loss of the fetus by miscarriage or stillbirth.  Avoid all smoking, herbs, alcohol, and unprescribed drugs. Chemicals in these products can affect the formation and growth of the baby.  Do not use any products that contain nicotine or tobacco, such as cigarettes and e-cigarettes. If you need help quitting, ask your health care provider.  Visit your dentist if you have not gone yet during your pregnancy. Use a soft toothbrush to brush your teeth and be gentle when you floss. Contact a health care provider if:  You have dizziness.  You have mild pelvic cramps, pelvic pressure, or nagging pain in the abdominal area.  You have persistent nausea, vomiting, or diarrhea.  You have a bad smelling vaginal discharge.  You have pain when you urinate. Get help right away if:  You have a fever.  You are leaking fluid from your vagina.  You have spotting or bleeding from your vagina.  You have severe abdominal cramping or pain.  You have rapid weight gain or weight loss.  You have  shortness of breath with chest pain.  You notice sudden or extreme swelling of your face, hands, ankles, feet, or legs.  You have not felt your baby move in over an hour.  You have severe headaches that do not go away when you take medicine.  You have vision changes. Summary  The second trimester is from week 14 through week 27 (months 4 through 6). It is also a time when the fetus is growing rapidly.  Your body goes through many changes during pregnancy. The changes vary from woman to woman.  Avoid all smoking, herbs, alcohol, and unprescribed drugs. These chemicals affect the formation and growth your baby.  Do not use any tobacco products, such as cigarettes, chewing tobacco, and e-cigarettes. If you need help quitting, ask your health care provider.  Contact your health care provider if you have any questions. Keep all prenatal visits as told by your health care provider. This is important. This information is not intended to replace advice given to you by your health care provider. Make sure you discuss any questions you have with your health care provider. Document Released: 12/30/2000 Document Revised: 06/13/2015 Document Reviewed: 03/08/2012 Elsevier Interactive Patient Education  2017 [employer].

## 2016-07-21 ENCOUNTER — Other Ambulatory Visit: Payer: Managed Care, Other (non HMO)

## 2016-07-21 ENCOUNTER — Encounter: Payer: Managed Care, Other (non HMO) | Admitting: Obstetrics and Gynecology

## 2016-07-24 ENCOUNTER — Encounter: Payer: Self-pay | Admitting: Cardiology

## 2016-08-04 ENCOUNTER — Other Ambulatory Visit: Payer: Self-pay | Admitting: Obstetrics and Gynecology

## 2016-08-05 ENCOUNTER — Other Ambulatory Visit: Payer: Self-pay | Admitting: Family Medicine

## 2016-08-05 DIAGNOSIS — I10 Essential (primary) hypertension: Secondary | ICD-10-CM

## 2016-08-05 DIAGNOSIS — Z3201 Encounter for pregnancy test, result positive: Secondary | ICD-10-CM

## 2016-08-11 ENCOUNTER — Encounter (HOSPITAL_COMMUNITY): Payer: Self-pay

## 2016-08-11 ENCOUNTER — Encounter (HOSPITAL_COMMUNITY)
Admission: RE | Admit: 2016-08-11 | Discharge: 2016-08-11 | Disposition: A | Discharge status: Home / Self Care | Payer: Managed Care, Other (non HMO) | Source: Ambulatory Visit | Attending: Obstetrics and Gynecology | Admitting: Obstetrics and Gynecology

## 2016-08-11 DIAGNOSIS — D509 Iron deficiency anemia, unspecified: Secondary | ICD-10-CM | POA: Diagnosis present

## 2016-08-11 DIAGNOSIS — Z3A00 Weeks of gestation of pregnancy not specified: Secondary | ICD-10-CM | POA: Insufficient documentation

## 2016-08-11 DIAGNOSIS — O99019 Anemia complicating pregnancy, unspecified trimester: Secondary | ICD-10-CM | POA: Insufficient documentation

## 2016-08-11 MED ORDER — SODIUM CHLORIDE 0.9 % IV SOLN
510.0000 mg | Freq: Once | INTRAVENOUS | Status: AC
  Administered 2016-08-11: 510 mg via INTRAVENOUS
  Filled 2016-08-11: qty 17

## 2016-08-11 MED ORDER — SODIUM CHLORIDE 0.9 % IV SOLN
INTRAVENOUS | Status: DC
  Administered 2016-08-11: 12:00:00 via INTRAVENOUS

## 2016-08-11 NOTE — Discharge Instructions (Signed)

## 2016-08-18 ENCOUNTER — Encounter (HOSPITAL_COMMUNITY): Payer: Self-pay

## 2016-08-18 ENCOUNTER — Encounter (HOSPITAL_COMMUNITY)
Admission: RE | Admit: 2016-08-18 | Discharge: 2016-08-18 | Disposition: A | Discharge status: Home / Self Care | Payer: Managed Care, Other (non HMO) | Source: Ambulatory Visit | Attending: Obstetrics and Gynecology | Admitting: Obstetrics and Gynecology

## 2016-08-18 DIAGNOSIS — O99019 Anemia complicating pregnancy, unspecified trimester: Secondary | ICD-10-CM | POA: Diagnosis not present

## 2016-08-18 MED ORDER — SODIUM CHLORIDE 0.9 % IV SOLN
INTRAVENOUS | Status: DC
  Administered 2016-08-18: 12:00:00 via INTRAVENOUS

## 2016-08-18 MED ORDER — SODIUM CHLORIDE 0.9 % IV SOLN
510.0000 mg | Freq: Once | INTRAVENOUS | Status: AC
  Administered 2016-08-18: 510 mg via INTRAVENOUS
  Filled 2016-08-18: qty 17

## 2016-08-20 ENCOUNTER — Telehealth: Payer: Self-pay | Admitting: Family Medicine

## 2016-08-20 NOTE — Telephone Encounter (Signed)
Pt states she was in on 08/11/16 and got labs done and she has not received results yet. Pt would like a call back.

## 2016-08-25 NOTE — Telephone Encounter (Signed)
Not in the chart, no labs order in months. Is this the right patient?

## 2016-08-26 ENCOUNTER — Other Ambulatory Visit: Payer: Self-pay | Admitting: *Deleted

## 2016-08-26 ENCOUNTER — Other Ambulatory Visit: Payer: Self-pay | Admitting: Cardiology

## 2016-08-26 DIAGNOSIS — R002 Palpitations: Secondary | ICD-10-CM

## 2016-08-26 DIAGNOSIS — R0602 Shortness of breath: Secondary | ICD-10-CM

## 2016-08-27 ENCOUNTER — Telehealth: Payer: Self-pay | Admitting: Cardiology

## 2016-08-27 ENCOUNTER — Other Ambulatory Visit: Payer: Self-pay

## 2016-08-27 ENCOUNTER — Ambulatory Visit (INDEPENDENT_AMBULATORY_CARE_PROVIDER_SITE_OTHER): Payer: Managed Care, Other (non HMO)

## 2016-08-27 DIAGNOSIS — R002 Palpitations: Secondary | ICD-10-CM | POA: Diagnosis not present

## 2016-08-27 DIAGNOSIS — R0602 Shortness of breath: Secondary | ICD-10-CM

## 2016-08-27 NOTE — Telephone Encounter (Signed)
Patient called back. She just had echo today. Results not completed. She verbalized understanding that we did not have results yet and she will ask [place] again to send results when we call her with them. She was very Patent attorney.

## 2016-08-27 NOTE — Telephone Encounter (Signed)
Please send echo results   Dr. Alanda Slim Cosins [phone]

## 2016-08-27 NOTE — Telephone Encounter (Signed)
No answer. Left message to call back.   

## 2016-09-04 ENCOUNTER — Other Ambulatory Visit: Payer: Self-pay | Admitting: Obstetrics and Gynecology

## 2016-09-10 ENCOUNTER — Encounter (HOSPITAL_COMMUNITY): Payer: Self-pay

## 2016-09-17 ENCOUNTER — Ambulatory Visit: Payer: Managed Care, Other (non HMO) | Admitting: Physician Assistant

## 2016-09-17 NOTE — Patient Instructions (Signed)
[place]  09/17/2016   Your procedure is scheduled on:  09/28/2016  Enter through the Main Entrance of [facility] at [place] up the phone at the desk and dial [phone].   Call this number if you have problems the morning of surgery: [phone]   Remember:   Do not eat food:After Midnight.  Do not drink clear liquids: After Midnight.  Take these medicines the morning of surgery with A SIP OF WATER: take aldomet as prescribed and may take valtrex the morning of surgery.   Do not wear jewelry, make-up or nail polish.  Do not wear lotions, powders, or perfumes. Do not wear deodorant.  Do not shave 48 hours prior to surgery.  Do not bring valuables to the hospital.  [facility] is not   responsible for any belongings or valuables brought to the hospital.  Contacts, dentures or bridgework may not be worn into surgery.  Leave suitcase in the car. After surgery it may be brought to your room.  For patients admitted to the hospital, checkout time is 11:00 AM the day of              discharge.   Patients discharged the day of surgery will not be allowed to drive             home.  Name and phone number of your driver: na   N/A   Please read over the following fact sheets that you were given:   Surgical Site Infection Prevention

## 2016-09-23 ENCOUNTER — Other Ambulatory Visit: Payer: Self-pay | Admitting: Family Medicine

## 2016-09-23 DIAGNOSIS — I10 Essential (primary) hypertension: Secondary | ICD-10-CM

## 2016-09-23 DIAGNOSIS — Z3201 Encounter for pregnancy test, result positive: Secondary | ICD-10-CM

## 2016-09-23 NOTE — Telephone Encounter (Signed)
Patient requesting refill of Methyldopa to [facility].

## 2016-09-25 ENCOUNTER — Encounter (HOSPITAL_COMMUNITY)
Admission: RE | Admit: 2016-09-25 | Discharge: 2016-09-25 | Disposition: A | Discharge status: Home / Self Care | Payer: 59 | Source: Ambulatory Visit | Attending: Obstetrics and Gynecology | Admitting: Obstetrics and Gynecology

## 2016-09-25 HISTORY — DX: Benign neoplasm of connective and other soft tissue, unspecified: D21.9

## 2016-09-25 HISTORY — DX: Anemia, unspecified: D64.9

## 2016-09-25 HISTORY — DX: Unspecified abnormal cytological findings in specimens from vagina: R87.629

## 2016-09-25 LAB — TYPE AND SCREEN
ABO/RH(D): O POS
Antibody Screen: NEGATIVE

## 2016-09-25 LAB — CBC
HEMATOCRIT: 32.7 % — AB (ref 36.0–46.0)
Hemoglobin: 10.7 g/dL — ABNORMAL LOW (ref 12.0–15.0)
MCH: 25.4 pg — AB (ref 26.0–34.0)
MCHC: 32.7 g/dL (ref 30.0–36.0)
MCV: 77.7 fL — AB (ref 78.0–100.0)
PLATELETS: 231 10*3/uL (ref 150–400)
RBC: 4.21 MIL/uL (ref 3.87–5.11)
RDW: 16.9 % — AB (ref 11.5–15.5)
WBC: 8.5 10*3/uL (ref 4.0–10.5)

## 2016-09-25 LAB — ABO/RH: ABO/RH(D): O POS

## 2016-09-26 LAB — RPR: RPR Ser Ql: NONREACTIVE

## 2016-09-28 ENCOUNTER — Inpatient Hospital Stay (HOSPITAL_COMMUNITY): Payer: 59 | Admitting: Anesthesiology

## 2016-09-28 ENCOUNTER — Inpatient Hospital Stay (HOSPITAL_COMMUNITY)
Admission: RE | Admit: 2016-09-28 | Discharge: 2016-10-01 | DRG: 765 | Disposition: A | Discharge status: Home / Self Care | Payer: 59 | Source: Ambulatory Visit | Attending: Obstetrics and Gynecology | Admitting: Obstetrics and Gynecology

## 2016-09-28 ENCOUNTER — Encounter (HOSPITAL_COMMUNITY)
Admission: RE | Disposition: A | Discharge status: Home / Self Care | Payer: Self-pay | Source: Ambulatory Visit | Attending: Obstetrics and Gynecology

## 2016-09-28 ENCOUNTER — Encounter (HOSPITAL_COMMUNITY): Payer: Self-pay | Admitting: Anesthesiology

## 2016-09-28 DIAGNOSIS — D62 Acute posthemorrhagic anemia: Secondary | ICD-10-CM | POA: Diagnosis not present

## 2016-09-28 DIAGNOSIS — O9081 Anemia of the puerperium: Secondary | ICD-10-CM | POA: Diagnosis not present

## 2016-09-28 DIAGNOSIS — O99214 Obesity complicating childbirth: Secondary | ICD-10-CM | POA: Diagnosis present

## 2016-09-28 DIAGNOSIS — Z9889 Other specified postprocedural states: Secondary | ICD-10-CM

## 2016-09-28 DIAGNOSIS — O3429 Maternal care due to uterine scar from other previous surgery: Principal | ICD-10-CM | POA: Diagnosis present

## 2016-09-28 DIAGNOSIS — O1002 Pre-existing essential hypertension complicating childbirth: Secondary | ICD-10-CM | POA: Diagnosis present

## 2016-09-28 DIAGNOSIS — Z3A37 37 weeks gestation of pregnancy: Secondary | ICD-10-CM | POA: Diagnosis not present

## 2016-09-28 DIAGNOSIS — O3483 Maternal care for other abnormalities of pelvic organs, third trimester: Secondary | ICD-10-CM | POA: Diagnosis present

## 2016-09-28 DIAGNOSIS — N838 Other noninflammatory disorders of ovary, fallopian tube and broad ligament: Secondary | ICD-10-CM | POA: Diagnosis present

## 2016-09-28 DIAGNOSIS — Z98891 History of uterine scar from previous surgery: Secondary | ICD-10-CM

## 2016-09-28 DIAGNOSIS — Z6841 Body Mass Index (BMI) 40.0 and over, adult: Secondary | ICD-10-CM | POA: Diagnosis not present

## 2016-09-28 SURGERY — Surgical Case
Anesthesia: Epidural | Wound class: Clean Contaminated

## 2016-09-28 MED ORDER — WITCH HAZEL-GLYCERIN EX PADS: 1.0000 "application " | MEDICATED_PAD | CUTANEOUS | Status: DC | PRN

## 2016-09-28 MED ORDER — BUPIVACAINE HCL (PF) 0.25 % IJ SOLN
INTRAMUSCULAR | Status: DC | PRN
Start: 2016-09-28 — End: 2016-09-28
  Administered 2016-09-28: 10 mL

## 2016-09-28 MED ORDER — BUPIVACAINE IN DEXTROSE 0.75-8.25 % IT SOLN
INTRATHECAL | Status: AC
  Filled 2016-09-28: qty 2

## 2016-09-28 MED ORDER — FENTANYL CITRATE (PF) 100 MCG/2ML IJ SOLN
INTRAMUSCULAR | Status: DC | PRN
  Administered 2016-09-28: 25 ug via INTRAVENOUS

## 2016-09-28 MED ORDER — OXYTOCIN 10 UNIT/ML IJ SOLN
INTRAMUSCULAR | Status: AC
  Filled 2016-09-28: qty 4

## 2016-09-28 MED ORDER — OXYTOCIN 10 UNIT/ML IJ SOLN
INTRAMUSCULAR | Status: DC | PRN
  Administered 2016-09-28: 40 [IU] via INTRAVENOUS

## 2016-09-28 MED ORDER — COCONUT OIL OIL
1.0000 "application " | TOPICAL_OIL | Status: DC | PRN
  Filled 2016-09-28: qty 120

## 2016-09-28 MED ORDER — DEXTROSE 5 % IV SOLN
INTRAVENOUS | Status: AC
  Filled 2016-09-28: qty 3000

## 2016-09-28 MED ORDER — DEXAMETHASONE SODIUM PHOSPHATE 4 MG/ML IJ SOLN
INTRAMUSCULAR | Status: AC
  Filled 2016-09-28: qty 1

## 2016-09-28 MED ORDER — SIMETHICONE 80 MG PO CHEW
80.0000 mg | CHEWABLE_TABLET | ORAL | Status: DC
  Administered 2016-09-29 – 2016-10-01 (×3): 80 mg via ORAL
  Filled 2016-09-28 (×3): qty 1

## 2016-09-28 MED ORDER — DEXAMETHASONE SODIUM PHOSPHATE 10 MG/ML IJ SOLN
INTRAMUSCULAR | Status: DC | PRN
  Administered 2016-09-28: 10 mg via INTRAVENOUS

## 2016-09-28 MED ORDER — METHYLDOPA 250 MG PO TABS
250.0000 mg | ORAL_TABLET | Freq: Three times a day (TID) | ORAL | Status: DC
  Administered 2016-09-28 – 2016-10-01 (×3): 250 mg via ORAL
  Filled 2016-09-28 (×13): qty 1

## 2016-09-28 MED ORDER — ONDANSETRON HCL 4 MG/2ML IJ SOLN
INTRAMUSCULAR | Status: AC
  Filled 2016-09-28: qty 2

## 2016-09-28 MED ORDER — OXYTOCIN 40 UNITS IN LACTATED RINGERS INFUSION - SIMPLE MED: 2.5000 [IU]/h | INTRAVENOUS | Status: DC

## 2016-09-28 MED ORDER — SIMETHICONE 80 MG PO CHEW: 80.0000 mg | CHEWABLE_TABLET | ORAL | Status: DC | PRN

## 2016-09-28 MED ORDER — FENTANYL CITRATE (PF) 100 MCG/2ML IJ SOLN: 25.0000 ug | INTRAMUSCULAR | Status: DC | PRN

## 2016-09-28 MED ORDER — MENTHOL 3 MG MT LOZG: 1.0000 | LOZENGE | OROMUCOSAL | Status: DC | PRN

## 2016-09-28 MED ORDER — LACTATED RINGERS IV SOLN
INTRAVENOUS | Status: DC
  Administered 2016-09-28 – 2016-09-29 (×2): via INTRAVENOUS

## 2016-09-28 MED ORDER — DEXTROSE 5 % IV SOLN
3.0000 g | INTRAVENOUS | Status: AC
  Administered 2016-09-28: 3 g via INTRAVENOUS
  Filled 2016-09-28: qty 3000

## 2016-09-28 MED ORDER — BUPIVACAINE IN DEXTROSE 0.75-8.25 % IT SOLN
INTRATHECAL | Status: DC | PRN
  Administered 2016-09-28: 2 mL via INTRATHECAL

## 2016-09-28 MED ORDER — DIPHENHYDRAMINE HCL 25 MG PO CAPS: 25.0000 mg | ORAL_CAPSULE | Freq: Four times a day (QID) | ORAL | Status: DC | PRN

## 2016-09-28 MED ORDER — ZOLPIDEM TARTRATE 5 MG PO TABS: 5.0000 mg | ORAL_TABLET | Freq: Every evening | ORAL | Status: DC | PRN

## 2016-09-28 MED ORDER — FENTANYL CITRATE (PF) 100 MCG/2ML IJ SOLN
INTRAMUSCULAR | Status: AC
  Filled 2016-09-28: qty 2

## 2016-09-28 MED ORDER — SODIUM CHLORIDE 0.9 % IR SOLN
Status: DC | PRN
  Administered 2016-09-28: 1

## 2016-09-28 MED ORDER — IBUPROFEN 600 MG PO TABS
600.0000 mg | ORAL_TABLET | Freq: Four times a day (QID) | ORAL | Status: DC
  Administered 2016-09-28 – 2016-10-01 (×12): 600 mg via ORAL
  Filled 2016-09-28 (×12): qty 1

## 2016-09-28 MED ORDER — BUPIVACAINE HCL (PF) 0.25 % IJ SOLN
INTRAMUSCULAR | Status: AC
Start: 2016-09-28 — End: 2016-09-28
  Filled 2016-09-28: qty 30

## 2016-09-28 MED ORDER — LIDOCAINE-EPINEPHRINE (PF) 2 %-1:200000 IJ SOLN
INTRAMUSCULAR | Status: AC
  Filled 2016-09-28: qty 20

## 2016-09-28 MED ORDER — LACTATED RINGERS IV SOLN
INTRAVENOUS | Status: DC | PRN
  Administered 2016-09-28: 10:00:00 via INTRAVENOUS

## 2016-09-28 MED ORDER — PHENYLEPHRINE 8 MG IN D5W 100 ML (0.08MG/ML) PREMIX OPTIME
INJECTION | INTRAVENOUS | Status: AC
Start: 2016-09-28 — End: 2016-09-28
  Filled 2016-09-28: qty 100

## 2016-09-28 MED ORDER — LACTATED RINGERS IV SOLN
INTRAVENOUS | Status: DC
  Administered 2016-09-28 (×2): via INTRAVENOUS

## 2016-09-28 MED ORDER — MORPHINE SULFATE (PF) 0.5 MG/ML IJ SOLN
INTRAMUSCULAR | Status: AC
  Filled 2016-09-28: qty 10

## 2016-09-28 MED ORDER — ONDANSETRON HCL 4 MG/2ML IJ SOLN
INTRAMUSCULAR | Status: DC | PRN
  Administered 2016-09-28: 4 mg via INTRAVENOUS

## 2016-09-28 MED ORDER — MORPHINE SULFATE (PF) 0.5 MG/ML IJ SOLN
INTRAMUSCULAR | Status: DC | PRN
  Administered 2016-09-28: .1 mg via EPIDURAL

## 2016-09-28 MED ORDER — SENNOSIDES-DOCUSATE SODIUM 8.6-50 MG PO TABS
2.0000 | ORAL_TABLET | ORAL | Status: DC
  Administered 2016-09-29 (×2): 2 via ORAL
  Filled 2016-09-28 (×2): qty 2

## 2016-09-28 MED ORDER — PRENATAL MULTIVITAMIN CH
1.0000 | ORAL_TABLET | Freq: Every day | ORAL | Status: DC
  Administered 2016-09-29 – 2016-10-01 (×3): 1 via ORAL
  Filled 2016-09-28 (×3): qty 1

## 2016-09-28 MED ORDER — HYDROMORPHONE HCL 2 MG PO TABS: 4.0000 mg | ORAL_TABLET | ORAL | Status: DC | PRN

## 2016-09-28 MED ORDER — PHENYLEPHRINE 8 MG IN D5W 100 ML (0.08MG/ML) PREMIX OPTIME
INJECTION | INTRAVENOUS | Status: DC | PRN
  Administered 2016-09-28: 50 ug/min via INTRAVENOUS
  Administered 2016-09-28: 70 ug/min via INTRAVENOUS
  Administered 2016-09-28: 40 ug/min via INTRAVENOUS

## 2016-09-28 MED ORDER — DIBUCAINE 1 % RE OINT: 1.0000 "application " | TOPICAL_OINTMENT | RECTAL | Status: DC | PRN

## 2016-09-28 MED ORDER — SIMETHICONE 80 MG PO CHEW
80.0000 mg | CHEWABLE_TABLET | Freq: Three times a day (TID) | ORAL | Status: DC
  Administered 2016-09-28 – 2016-10-01 (×7): 80 mg via ORAL
  Filled 2016-09-28 (×7): qty 1

## 2016-09-28 SURGICAL SUPPLY — 49 items
APL SKNCLS STERI-STRIP NONHPOA (GAUZE/BANDAGES/DRESSINGS) ×1
BARRIER ADHS 3X4 INTERCEED (GAUZE/BANDAGES/DRESSINGS) ×3 IMPLANT
BENZOIN TINCTURE PRP APPL 2/3 (GAUZE/BANDAGES/DRESSINGS) ×2 IMPLANT
BRR ADH 4X3 ABS CNTRL BYND (GAUZE/BANDAGES/DRESSINGS) ×1
CHLORAPREP W/TINT 26ML (MISCELLANEOUS) ×3 IMPLANT
CLAMP CORD UMBIL (MISCELLANEOUS) IMPLANT
CLOSURE STERI STRIP 1/2 X4 (GAUZE/BANDAGES/DRESSINGS) ×2 IMPLANT
CLOSURE WOUND 1/2 X4 (GAUZE/BANDAGES/DRESSINGS)
CLOTH BEACON ORANGE TIMEOUT ST (SAFETY) ×3 IMPLANT
CONTAINER PREFILL 10% NBF 15ML (MISCELLANEOUS) IMPLANT
DRAPE C SECTION CLR SCREEN (DRAPES) ×3 IMPLANT
DRSG OPSITE POSTOP 4X10 (GAUZE/BANDAGES/DRESSINGS) ×3 IMPLANT
ELECT REM PT RETURN 9FT ADLT (ELECTROSURGICAL) ×3
ELECTRODE REM PT RTRN 9FT ADLT (ELECTROSURGICAL) ×1 IMPLANT
EXTRACTOR VACUUM M CUP 4 TUBE (SUCTIONS) IMPLANT
EXTRACTOR VACUUM M CUP 4' TUBE (SUCTIONS)
GLOVE BIOGEL PI IND STRL 7.0 (GLOVE) ×2 IMPLANT
GLOVE BIOGEL PI INDICATOR 7.0 (GLOVE) ×4
GLOVE ECLIPSE 6.5 STRL STRAW (GLOVE) ×3 IMPLANT
GOWN STRL REUS W/TWL LRG LVL3 (GOWN DISPOSABLE) ×6 IMPLANT
HOVERMATT SINGLE USE (MISCELLANEOUS) ×2 IMPLANT
KIT ABG SYR 3ML LUER SLIP (SYRINGE) IMPLANT
NEEDLE HYPO 22GX1.5 SAFETY (NEEDLE) ×3 IMPLANT
NEEDLE HYPO 25X5/8 SAFETYGLIDE (NEEDLE) IMPLANT
NS IRRIG 1000ML POUR BTL (IV SOLUTION) ×3 IMPLANT
PACK C SECTION WH (CUSTOM PROCEDURE TRAY) ×3 IMPLANT
PAD OB MATERNITY 4.3X12.25 (PERSONAL CARE ITEMS) ×3 IMPLANT
RETRACTOR TRAXI PANNICULUS (MISCELLANEOUS) IMPLANT
RTRCTR C-SECT PINK 25CM LRG (MISCELLANEOUS) ×2 IMPLANT
STRIP CLOSURE SKIN 1/2X4 (GAUZE/BANDAGES/DRESSINGS) IMPLANT
SUT CHROMIC GUT AB #0 18 (SUTURE) IMPLANT
SUT MNCRL 0 VIOLET CTX 36 (SUTURE) ×3 IMPLANT
SUT MON AB 2-0 SH 27 (SUTURE)
SUT MON AB 2-0 SH27 (SUTURE) IMPLANT
SUT MON AB 3-0 SH 27 (SUTURE)
SUT MON AB 3-0 SH27 (SUTURE) IMPLANT
SUT MON AB 4-0 PS1 27 (SUTURE) IMPLANT
SUT MONOCRYL 0 CTX 36 (SUTURE) ×6
SUT PLAIN 2 0 (SUTURE)
SUT PLAIN 2 0 XLH (SUTURE) IMPLANT
SUT PLAIN ABS 2-0 CT1 27XMFL (SUTURE) IMPLANT
SUT VIC AB 0 CT1 36 (SUTURE) ×6 IMPLANT
SUT VIC AB 2-0 CT1 27 (SUTURE) ×3
SUT VIC AB 2-0 CT1 TAPERPNT 27 (SUTURE) ×1 IMPLANT
SUT VIC AB 4-0 PS2 27 (SUTURE) IMPLANT
SYR CONTROL 10ML LL (SYRINGE) ×3 IMPLANT
TOWEL OR 17X24 6PK STRL BLUE (TOWEL DISPOSABLE) ×3 IMPLANT
TRAXI PANNICULUS RETRACTOR (MISCELLANEOUS) ×2
TRAY FOLEY BAG SILVER LF 14FR (SET/KITS/TRAYS/PACK) IMPLANT

## 2016-09-28 NOTE — Transfer of Care (Signed)
Immediate Anesthesia Transfer of Care Note  Patient: Michelle Ware  Procedure(s) Performed: Procedure(s) with comments: Primary CESAREAN SECTION (N/A) - EDD: 10/19/16 Allergy: Hydrocodone-Acetaminophen  Patient Location: PACU  Anesthesia Type:Spinal  Level of Consciousness: awake, alert  and patient cooperative  Airway & Oxygen Therapy: Patient Spontanous Breathing  Post-op Assessment: Report given to RN and Post -op Vital signs reviewed and stable  Post vital signs: Reviewed and stable  Last Vitals:  Vitals:   09/28/16 0743  BP: (!) 142/76  Pulse: 94  Resp: 18  Temp: 36.9 C    Last Pain:  Vitals:   09/28/16 0743  TempSrc: Oral         Complications: No apparent anesthesia complications

## 2016-09-28 NOTE — Brief Op Note (Signed)
09/28/2016  10:35 AM  PATIENT:  Michelle Ware  36 y.o. female  PRE-OPERATIVE DIAGNOSIS:  Previous Myomectomy, Chronic Hypertension, Morbid Obesity, IUP @ 37 weeks  POST-OPERATIVE DIAGNOSIS:  Previous Myomectomy, Chronic Hypertension, Morbid Obesity, left paraubal cyst, IUP @ 37 weeks  PROCEDURE:  Primary cesarean section, kerr hysterotomy, left paratubal cyst removal  SURGEON:  Surgeon(s) and Role:    * Servando Salina, MD - Primary  PHYSICIAN ASSISTANT:   ASSISTANTS: Lars Pinks, CNM   FINDINGS; live female, nl ovaries, 3cm left paratubal cyst  ANESTHESIA:   spinal Findings:  EBL:  Total I/O In: 2000 [I.V.:2000] Out: 1400 [Urine:50; Blood:1350]  BLOOD ADMINISTERED:none  DRAINS: none   LOCAL MEDICATIONS USED:  MARCAINE     SPECIMEN:  Source of Specimen:  left paratubal cyst  DISPOSITION OF SPECIMEN:  PATHOLOGY  COUNTS:  YES  TOURNIQUET:  * No tourniquets in log *  DICTATION: .Other Dictation: Dictation Number [id number]  PLAN OF CARE: Admit to inpatient   PATIENT DISPOSITION:  PACU - hemodynamically stable.   Delay start of Pharmacological VTE agent (>24hrs) due to surgical blood loss or risk of bleeding: no

## 2016-09-28 NOTE — Anesthesia Postprocedure Evaluation (Signed)
Anesthesia Post Note  Patient: Michelle Ware  Procedure(s) Performed: Procedure(s) (LRB): Primary CESAREAN SECTION (N/A)     Patient location during evaluation: PACU Anesthesia Type: Spinal Level of consciousness: awake Pain management: satisfactory to patient Vital Signs Assessment: post-procedure vital signs reviewed and stable Respiratory status: spontaneous breathing Cardiovascular status: blood pressure returned to baseline Postop Assessment: no headache and spinal receding Anesthetic complications: no    Last Vitals:  Vitals:   09/28/16 1200 09/28/16 1215  BP: 100/70 113/62  Pulse: 68 70  Resp: 17 17  Temp:    SpO2: 99% 100%    Last Pain:  Vitals:   09/28/16 1215  TempSrc:   PainSc: 0-No pain   Pain Goal:                 Riccardo Dubin

## 2016-09-28 NOTE — Anesthesia Procedure Notes (Signed)
Spinal  Patient location during procedure: OR Staffing Anesthesiologist: Lyndle Herrlich Spinal Block Patient position: sitting Prep: DuraPrep Patient monitoring: heart rate, blood pressure and continuous pulse ox Approach: right paramedian Location: L3-4 Injection technique: single-shot Needle Needle type: Sprotte  Needle gauge: 24 G Needle length: 9 cm Needle insertion depth: 9 cm Catheter at skin depth: 16 cm Assessment Sensory level: T4 Additional Notes CSE; LOR w/o difficulty  Catheter passed smoothly.  (-) asp CSF/heme  Spinal Dosage in OR  .75% Bupivicaine ml       1.6     [id number]   mcg        100    Fentanyl mcg            25

## 2016-09-28 NOTE — Anesthesia Preprocedure Evaluation (Addendum)
Anesthesia Evaluation  Patient identified by MRN, date of birth, ID band Patient awake    Reviewed: Allergy & Precautions, H&P , Patient's Chart, lab work & pertinent test results, reviewed documented beta blocker date and time   Airway Mallampati: II  TM Distance: >3 FB Neck ROM: full    Dental no notable dental hx.    Pulmonary    Pulmonary exam normal breath sounds clear to auscultation       Cardiovascular hypertension, On Medications  Rhythm:regular Rate:Normal     Neuro/Psych    GI/Hepatic   Endo/Other  Morbid obesity  Renal/GU      Musculoskeletal   Abdominal   Peds  Hematology   Anesthesia Other Findings   Reproductive/Obstetrics                             Anesthesia Physical Anesthesia Plan  ASA: III  Anesthesia Plan: Spinal, Epidural and Combined Spinal and Epidural   Post-op Pain Management:    Induction:   PONV Risk Score and Plan: 2 and Ondansetron and Dexamethasone  Airway Management Planned:   Additional Equipment:   Intra-op Plan:   Post-operative Plan:   Informed Consent: I have reviewed the patients History and Physical, chart, labs and discussed the procedure including the risks, benefits and alternatives for the proposed anesthesia with the patient or authorized representative who has indicated his/her understanding and acceptance.   Dental Advisory Given  Plan Discussed with: CRNA and Surgeon  Anesthesia Plan Comments: (  )       Anesthesia Quick Evaluation

## 2016-09-29 LAB — CBC
HEMATOCRIT: 26 % — AB (ref 36.0–46.0)
HEMOGLOBIN: 8.7 g/dL — AB (ref 12.0–15.0)
MCH: 26.1 pg (ref 26.0–34.0)
MCHC: 33.5 g/dL (ref 30.0–36.0)
MCV: 78.1 fL (ref 78.0–100.0)
Platelets: 192 10*3/uL (ref 150–400)
RBC: 3.33 MIL/uL — ABNORMAL LOW (ref 3.87–5.11)
RDW: 17 % — AB (ref 11.5–15.5)
WBC: 13.6 10*3/uL — ABNORMAL HIGH (ref 4.0–10.5)

## 2016-09-29 LAB — HIV ANTIBODY (ROUTINE TESTING W REFLEX): HIV Screen 4th Generation wRfx: NONREACTIVE

## 2016-09-29 MED ORDER — FERROUS SULFATE 325 (65 FE) MG PO TABS
325.0000 mg | ORAL_TABLET | Freq: Two times a day (BID) | ORAL | Status: DC
  Administered 2016-09-29: 325 mg via ORAL
  Filled 2016-09-29 (×2): qty 1

## 2016-09-29 MED ORDER — MAGNESIUM OXIDE 400 (241.3 MG) MG PO TABS
400.0000 mg | ORAL_TABLET | Freq: Every day | ORAL | Status: DC
  Administered 2016-09-29 – 2016-10-01 (×2): 400 mg via ORAL
  Filled 2016-09-29 (×4): qty 1

## 2016-09-29 NOTE — Lactation Note (Signed)
This note was copied from a baby's chart. Lactation Consultation Note  Patient Name: Michelle Ware CLEXN'T Date: 09/29/2016 Reason for consult: Initial assessment;Early term 37-38.6wks;Primapara;Other (Comment) (mom has a Hx of PCOS, had several breast changes with pregnancy )  Baby is 34 hours old, and when [facility] entered the room baby already latched with firm support, and FISH lips, and per mom comfortable with the latch, also mentioned she was having cramping.  LC discussed nutritive vs non - nutritive feeding patterns and baby's body laugh when baby is hungry and when the baby is  Finish feeding. LC enc mom to watch for hanging out latched.  LC recommended since the baby is an Early term infant to feed the 1st breast for 15 -20 mins, ( 30 mins max , supplement ) following the LPT policy ) and pot pump both breast for 15 -20 mins . Per mom using the #27 flanges and they are comfortable.  DEBP had been set up by the [facility] , and per mom had pumped x 3. LC recommended pumping after every feeding,  Until milk comes in and then will be reassessed.  Mom receptive to teaching.  Mother informed of post-discharge support and given phone number to the lactation department, including services for phone call assistance; out-patient appointments; and breastfeeding support group. List of other breastfeeding resources in the community given in the handout. Encouraged mother to call for problems or concerns related to breastfeeding.   Maternal Data Has patient been taught Hand Expression?: Yes (per mom has been shown/ LC reviewed. ) Does the patient have breastfeeding experience prior to this delivery?: No  Feeding Feeding Type:  (baby already latched with swallows after breast compressions  Simultaneous filing. User may not have seen previous data.) Nipple Type: Slow - flow  LATCH Score Latch:  (latched with depth  Simultaneous filing. User may not have seen previous data.)  Audible Swallowing:   (swallows noted with breast compressions Simultaneous filing. User may not have seen previous data.)  Type of Nipple:  (areola compresses well  Simultaneous filing. User may not have seen previous data.)  Comfort (Breast/Nipple): Soft / non-tender  Hold (Positioning):  (was assisted by the [facility] RN  Simultaneous filing. User may not have seen previous data.)     Interventions Interventions: Breast feeding basics reviewed;DEBP;Breast compression  Lactation Tools Discussed/Used Tools: Pump (already set up by the [facility] RN - per mom has pumped x 3 ) WIC Program: No   Consult Status Consult Status: Follow-up Date: 09/30/16 Follow-up type: In-patient    [place] 09/29/2016, 3:02 PM

## 2016-09-29 NOTE — Progress Notes (Addendum)
Order given by Lars Pinks CNM  for dressing.

## 2016-09-29 NOTE — Op Note (Signed)
NAME:  [name], Michelle Ware                     ACCOUNT NO.:  MEDICAL RECORD NO.:  [id number]  LOCATION:                                 FACILITY:  PHYSICIAN:  Servando Salina, M.D.    DATE OF BIRTH:  DATE OF PROCEDURE:  09/28/2016 DATE OF DISCHARGE:                              OPERATIVE REPORT   PREOPERATIVE DIAGNOSES: 1. Previous myomectomy. 2. Chronic hypertension. 3. Morbid obesity. 4. Intrauterine gestation at 37 weeks.  PROCEDURES: 1. Primary cesarean section, Kerr hysterotomy. 2. Left paratubal cyst removal.  POSTOPERATIVE DIAGNOSES: 1. Previous myomectomy. 2. Chronic hypertension. 3. Morbid obesity. 4. Left paratubal cyst. 5. Intrauterine gestation at 37 weeks.  ANESTHESIA:  Spinal.  SURGEON:  Servando Salina, M.D.  ASSISTANT:  Lars Pinks, CNM  DESCRIPTION OF PROCEDURE:  Under adequate spinal anesthesia, the patient was placed in supine position with a left lateral tilt.  She was sterilely prepped and draped in usual fashion.  Indwelling Foley catheter was sterilely placed.  Marcaine 0.25% was injected along the planned Pfannenstiel skin incision site.  Pfannenstiel skin incision was then made, carried down to the rectus fascia.  Rectus fascia was opened transversely.  Rectus fascia was then bluntly and sharply dissected off the rectus muscle in superior and inferior fashion.  The rectus muscles split in the midline.  The parietal peritoneum was entered bluntly and extended.  A self-retaining Alexis retractor was then placed. Vesicouterine peritoneum was opened transversely.  The bladder was displaced inferiorly with blunt dissection.  A curvilinear low transverse uterine incision was then made and extended with bandage scissors.  Artificial rupture of membranes occurred.  Clear amniotic fluid noted.  Subsequent delivery of a live female who was bulb suctioned in the abdomen and after delayed cord clamping, the cord was clamped, cut, the baby was  transferred to the awaiting pediatricians, who assigned Apgars of 8 and 8 at one and five minutes respectively.  The placenta was spontaneous intact, not sent.  Intrauterine cavity was cleaned of debris.  Incision had no extension, was closed in 2 layers, the first layer of 0 Monocryl running lock stitch, second layer was imbricating 0 Monocryl suture.  Normal right tube and ovary were noted. The left ovary was normal.  The left tube had a large 3 cm distal paratubal cyst, for which decision was then made to remove.  While grasping the cyst and using  cautery, the cyst was enucleated from its base and sent intact.  Small bleeders cauterized.  Normal tubes otherwise noted.  The abdomen was irrigated and suctioned of debris. Interceed in inverted T fashion was placed overlying the lower uterine segment.  The Alexis retractor was removed.  The parietal peritoneum was then closed with 2-0 Vicryl.  The rectus fascia was closed with 0 Vicryl x2.  The subcutaneous irrigated, small bleeders cauterized.  Interrupted 2-0 plain sutures placed and the skin approximated using 4-0 Vicryl subcuticular closure.  Benzoin and Steri-Strips were placed.  SPECIMEN:  Left paratubal cyst sent to Pathology.  ESTIMATED BLOOD LOSS:  600 mL.  URINE OUTPUT:  50 mL.  INTRAOPERATIVE FLUID:  1800 mL.  COUNTS:  Sponge and instrument counts x2  were correct.  COMPLICATIONS:  None.  The patient tolerated the procedure well, was transferred to recovery in stable condition.  The baby was transferred to NICU for respiratory issues.     Servando Salina, M.D.     [place]/MEDQ  D:  09/28/2016  T:  09/29/2016  Job:  [id number]

## 2016-09-29 NOTE — Progress Notes (Signed)
POSTOPERATIVE DAY # 1 S/P Primary LTCS for hx. Of Myomectomy and CHTN   S:         Reports feeling okay, minimal sleep overnight   BPs WNL - Aldomet dose held last night for low BP             Tolerating po intake / no nausea / no vomiting / + flatus / no BM  Denies dizziness, SOB, or CP             Bleeding is light             Pain controlled with Motrin             Up ad lib / ambulatory/ voiding QS  Newborn breast feeding with formula supplementation / Circumcision - planning with Dr. Garwin Brothers   O:  VS: BP (!) 126/56   Pulse 90   Temp 98.1 F (36.7 C) (Oral)   Resp 18   Ht 5\' 10"  (1.778 m)   Wt (!) 142 kg (313 lb)   LMP 01/13/2016 (Exact Date)   SpO2 96%   Breastfeeding? Unknown   BMI 44.91 kg/m    LABS:               Recent Labs  09/29/16 0641  WBC 13.6*  HGB 8.7*  PLT 192               Bloodtype: --/--/O POS, O POS (09/07 1320)  Rubella: 1.60 (02/16 1548)                                             I&O: Intake/Output      09/10 0701 - 09/11 0700 09/11 0701 - 09/12 0700   I.V. (mL/kg) 2400 (16.9)    Total Intake(mL/kg) 2400 (16.9)    Urine (mL/kg/hr) 2325    Blood 1124    Total Output 3449     Net -1049                       Physical Exam:             Alert and Oriented X3  Lungs: Clear and unlabored  Heart: regular rate and rhythm / no mumurs  Abdomen: soft, non-tender, non-distended, active bowel sounds in all quadrants             Fundus: firm, non-tender, U-E             Dressing: honeycomb with steri-strips c/d/i              Incision:  approximated with sutures / no erythema / no ecchymosis / no drainage  Perineum: intact  Lochia: appropriate, no clots  Extremities: trace pedal edema, no calf pain or tenderness,  SCDs on   A:        POD # 1 S/P Primary LTCS for hx. Of Myomectomy and CHTN             ABL Anemia - stable, asymptomatic  Chronic Hypertension- BPs stable, on Aldomet TID  P:        Routine postoperative care              Begin  Ferrous Sulfate 325mg  BID with meals  Magnesium oxide 400mg  daily  May shower today  May D/C IV today  Continue current care  May want  early d/c home tomorrow due to pending inclement weather  Lars Pinks, MSN, CNM [facility]

## 2016-09-30 NOTE — Lactation Note (Signed)
This note was copied from a baby's chart. Lactation Consultation Note  Patient Name: Michelle Ware JOITG'P Date: 09/30/2016 Reason for consult: Follow-up assessment;Early term 37-38.6wks;1st time breastfeeding   Follow up with first time mom of 10 hour old early term infant. Infant with 3 BF for 15-30 minutes, 4 attempts, formula via bottle 10-42 cc, 5 voids and 6 stools in last 24 hours. Infant weight 6 lb 6.7 oz with 5% weight loss since birth. LATCH scores 7-8.   Mom reports she attempts to latch infant to breast with each feeding. She reports at times he is frustrated and will not latch so she gives him a bottle. Mom is not pumping at this time, enc mom to pump about every 3 hours for 15 minutes after BF to stimulate milk production. Mom reports she does not feel fuller today. Enc mom that since infant is early term and maternal history of CHTN and PCOS with facial hair present. Mom reports she has no questions/concerns at this time. She declined LC assistance at this time. She reports she is comfortable using pump. Enc mom to call out for feeding assistance as needed.    Maternal Data Does the patient have breastfeeding experience prior to this delivery?: No  Feeding Feeding Type: Bottle Fed - Formula Length of feed: 30 min  LATCH Score Latch: Repeated attempts needed to sustain latch, nipple held in mouth throughout feeding, stimulation needed to elicit sucking reflex.  Audible Swallowing: A few with stimulation  Type of Nipple: Everted at rest and after stimulation  Comfort (Breast/Nipple): Soft / non-tender  Hold (Positioning): Assistance needed to correctly position infant at breast and maintain latch.  LATCH Score: 7  Interventions    Lactation Tools Discussed/Used Pump Review: Setup, frequency, and cleaning Initiated by:: reviewed and encouraged every 2-3 hours post BF   Consult Status Consult Status: Follow-up Date: 10/01/16 Follow-up type:  In-patient    Debby Freiberg Andraya Frigon 09/30/2016, 2:39 PM

## 2016-09-30 NOTE — Progress Notes (Signed)
CSW received consult for Edinburgh Postnatal Depression Scale score of 6, which is below a score of 9 or above/automatic CSW consult.  Please re-consult CSW if concerns arise or by MOB's request.

## 2016-09-30 NOTE — Lactation Note (Signed)
This note was copied from a baby's chart. Lactation Consultation Note  Patient Name: Michelle Ware Date: 09/30/2016 Reason for consult: Follow-up assessment Baby at 60 hr of life. Lactation was called to help latch baby. Upon entry mom was offering a bottle of formula. Mom stated she tried latch but baby got "jittery and fussy" so she offered a bottle. Mom did not use the DEBP "yet". She stated " He gets so mad I don't know that I want to bf. Its easier to give bottles". Discussed baby behavior, feeding frequency, pumping, supplementing volume guidelines, the risks of pacifiers, baby belly size, voids, wt loss, breast changes, and nipple care. Left lactation phone number on the white board for mom to call at the next feeding if she needs latch help.    Maternal Data    Feeding Feeding Type: Bottle Fed - Formula Nipple Type: Slow - flow  LATCH Score                   Interventions    Lactation Tools Discussed/Used     Consult Status Consult Status: Follow-up Date: 10/01/16 Follow-up type: In-patient    Denzil Hughes 09/30/2016, 10:01 PM

## 2016-09-30 NOTE — Progress Notes (Signed)
POSTOPERATIVE DAY # 2 S/P CS  S:         Reports feeling ok             Tolerating po intake / no nausea / no vomiting / + flatus / no BM             Bleeding is light             Pain controlled with motrin             Up ad lib / ambulatory/ voiding QS  Newborn breast feeding   O:  VS: BP (!) 148/61 (BP Location: Right Arm)   Pulse 90   Temp 98.2 F (36.8 C) (Oral)   Resp 18   Ht 5\' 10"  (1.778 m)   Wt (!) 142 kg (313 lb)   LMP 01/13/2016 (Exact Date)   SpO2 99%   Breastfeeding? Unknown   BMI 44.91 kg/m                LABS:               Recent Labs  09/29/16 0641  WBC 13.6*  HGB 8.7*  PLT 192               Bloodtype: --/--/O POS, O POS (09/07 1320)  Rubella: 1.60 (02/16 1548)                                             I&O: Intake/Output      09/11 0701 - 09/12 0700 09/12 0701 - 09/13 0700   Urine (mL/kg/hr) 700 (0.2)    Total Output 700     Net -700                     Physical Exam:             Alert and Oriented X3  Lungs: Clear and unlabored  Heart: regular rate and rhythm / no mumurs  Abdomen: soft, non-tender, non-distended, active BS             Fundus: firm, non-tender, Ueven             Dressing intact              Incision:  approximated with suture / no erythema / no ecchymosis / no drainage  Perineum: intact  Lochia: light  Extremities: edema, no calf pain or tenderness, negative Homans  A:        POD # 2 S/P CS            Chronic hypertension on Aldomet - initial doses held for hypotension immediate post-op   P:        Routine postoperative care              Dr Benjie Karvonen reviewed BP and status             DC tomorrow     Artelia Laroche CNM, MSN, [facility] 09/30/2016, 12:45 PM

## 2016-10-01 MED ORDER — HYDROMORPHONE HCL 4 MG PO TABS: 4.0000 mg | ORAL_TABLET | ORAL | 0 refills | Status: DC | PRN

## 2016-10-01 MED ORDER — MAGNESIUM OXIDE 400 (241.3 MG) MG PO TABS: 400.0000 mg | ORAL_TABLET | Freq: Every day | ORAL | 0 refills | Status: DC

## 2016-10-01 MED ORDER — IBUPROFEN 600 MG PO TABS: 600.0000 mg | ORAL_TABLET | Freq: Four times a day (QID) | ORAL | 0 refills | Status: DC

## 2016-10-01 MED ORDER — FERROUS SULFATE 325 (65 FE) MG PO TABS: 325.0000 mg | ORAL_TABLET | Freq: Two times a day (BID) | ORAL | 0 refills | Status: DC

## 2016-10-01 NOTE — Discharge Summary (Signed)
OB Discharge Summary  Patient Name: Michelle Ware DOB: 1981/01/02 MRN: [id number]  Date of admission: 09/28/2016  Admitting diagnosis: Previous Myomectomy, Chronic Hypertension, Morbid Obesity Intrauterine pregnancy: [redacted]w[redacted]d      Date of discharge: 10/01/2016    Discharge diagnosis: Term Pregnancy Delivered, CHTN and ABL anemia      Prenatal history: G2P1011   EDC : 10/19/2016, by Last Menstrual Period  Prenatal care at [facility]  Primary provider : Brynnly Bonet Prenatal course complicated by AMA, obesity, chronic hypertension, hx myomectomy  Prenatal Labs: ABO, Rh: --/--/O POS, O POS (09/07 1320)  Antibody: NEG (09/07 1320) Rubella: 1.60 (02/16 1548)   RPR: Non Reactive (09/07 1320)  HBsAg: Negative (02/16 1548)  HIV:   negative                                     Hospital course:  Sceduled C/S   36 y.o. yo G2P1011 at [redacted]w[redacted]d was admitted to the hospital 09/28/2016 for scheduled cesarean section with the following indication:Prior Uterine Surgery with myomectomy.  Membrane Rupture Time/Date: 9:47 AM ,09/28/2016  with delivery of a Viable infant on 09/28/2016   Details of operation can be found in separate operative note.    [name] had an uncomplicated postpartum course.  She is ambulating, tolerating a regular diet, passing flatus, and urinating well. Patient is discharged home in stable condition on 10/01/16. Currently taking Aldomet 250 TID (some doses held postpartum due to initial hypotension postop)         Delivering PROVIDER: Miray Mancino                                                            Complications: None  Newborn Data: Live born female  Birth Weight: 6 lb 11.9 oz (3060 g) APGAR: 8, 8  Baby Feeding: Bottle and Breast Disposition:home with mother  Post partum procedures:none  Labs: Lab Results  Component Value Date   WBC 13.6 (H) 09/29/2016   HGB 8.7 (L) 09/29/2016   HCT 26.0 (L) 09/29/2016   MCV 78.1 09/29/2016   PLT 192  09/29/2016   CMP Latest Ref Rng & Units 04/07/2016  Glucose 65 - 99 mg/dL 84  BUN 6 - 20 mg/dL 10  Creatinine 0.57 - 1.00 mg/dL 0.81  Sodium 134 - 144 mmol/L 136  Potassium 3.5 - 5.2 mmol/L 4.4  Chloride 96 - 106 mmol/L 101  CO2 18 - 29 mmol/L 21  Calcium 8.7 - 10.2 mg/dL 9.3  Total Protein 6.0 - 8.5 g/dL 6.8  Total Bilirubin 0.0 - 1.2 mg/dL <0.2  Alkaline Phos 39 - 117 IU/L 30(L)  AST 0 - 40 IU/L 19  ALT 0 - 32 IU/L 14      Physical Exam @ time of discharge:  Vitals:   09/30/16 1055 09/30/16 1600 09/30/16 1939 10/01/16 0548  BP: (!) 148/61 (!) 135/58 140/72 135/73  Pulse: 90 87 86 79  Resp: 18   18  Temp: 98.2 F (36.8 C)   97.8 F (36.6 C)  TempSrc: Oral   Oral  SpO2:      Weight:      Height:        General:  alert, cooperative and no distress Lochia: appropriate Uterine Fundus: firm Perineum: intact Incision: Healing well with no significant drainage Extremities: DVT Evaluation: No evidence of DVT seen on physical exam.   Discharge instructions:  "Baby and Me Booklet" and [place] Booklet  Discharge Medications:  Allergies as of 10/01/2016      Reactions   Hydrocodone Itching      Medication List    STOP taking these medications   aspirin EC 81 MG tablet     TAKE these medications   CITRANATAL 90 DHA 90-1 & 300 MG Misc TAKE 1 TABLET AND TAKE ONE CAPSULE EVERY DAY   ferrous sulfate 325 (65 FE) MG tablet Take 1 tablet (325 mg total) by mouth 2 (two) times daily with a meal.   HYDROmorphone 4 MG tablet Commonly known as:  DILAUDID Take 1 tablet (4 mg total) by mouth every 4 (four) hours as needed for severe pain.   ibuprofen 600 MG tablet Commonly known as:  ADVIL,MOTRIN Take 1 tablet (600 mg total) by mouth every 6 (six) hours.   magnesium oxide 400 (241.3 Mg) MG tablet Commonly known as:  MAG-OX Take 1 tablet (400 mg total) by mouth daily.   methyldopa 250 MG tablet Commonly known as:  ALDOMET TAKE 1 TABLET (250 MG TOTAL) BY MOUTH 3  (THREE) TIMES DAILY.   Vitamin D (Ergocalciferol) 2000 units Caps Take 2,000 Units by mouth daily.            Discharge Care Instructions        Start     Ordered   10/01/16 0000  HYDROmorphone (DILAUDID) 4 MG tablet  Every 4 hours PRN    Question:  Supervising Provider  Answer:  Brien Few   10/01/16 0929   10/01/16 0000  ibuprofen (ADVIL,MOTRIN) 600 MG tablet  Every 6 hours    Question:  Supervising Provider  Answer:  Brien Few   10/01/16 0929   10/01/16 0000  ferrous sulfate 325 (65 FE) MG tablet  2 times daily with meals    Question:  Supervising Provider  Answer:  Brien Few   10/01/16 0929   10/01/16 0000  magnesium oxide (MAG-OX) 400 (241.3 Mg) MG tablet  Daily    Question:  Supervising Provider  Answer:  Brien Few   10/01/16 0929   10/01/16 0000  Discharge instructions    Comments:  [place] Discharge Booklet   10/01/16 0929   10/01/16 0000  Call MD for:    Comments:  Heavy bleeding   10/01/16 0929   10/01/16 0000  Call MD for:  temperature >100.4     10/01/16 0929   10/01/16 0000  Call MD for:  severe uncontrolled pain     10/01/16 0929   10/01/16 0000  Call MD for:  redness, tenderness, or signs of infection (pain, swelling, redness, odor or green/yellow discharge around incision site)     10/01/16 0929   10/01/16 0000  Activity as tolerated     10/01/16 0929   10/01/16 0000  Lifting restrictions    Comments:  Weight restriction of 25 lbs.   10/01/16 0929   10/01/16 0000  Driving restriction    Comments:  Avoid driving for at least 2 weeks.   10/01/16 0929   10/01/16 0000  Sexual acrtivity    Comments:  No sex for 6 weeks   10/01/16 0929   10/01/16 0000  Discharge wound care:    Comments:  Keep incision clean and dry   10/01/16  0349   10/01/16 0000  Diet general     10/01/16 0929      Diet: low salt diet  Activity: Advance as tolerated. Pelvic rest x 6 weeks.   Follow up: 1 week for BP check / possible change in BP  medication (pre-pregnancy used HCTZ)    Signed: Artelia Laroche CNM, MSN, [place] 10/01/2016, 9:30 AM

## 2016-10-01 NOTE — Lactation Note (Signed)
This note was copied from a baby's chart. Lactation Consultation Note  Patient Name: Boy Ayat Drenning QDIYM'E Date: 10/01/2016 Reason for consult: Follow-up assessment;Early term 37-38.6wks;Maternal endocrine disorder Type of Endocrine Disorder?: PCOS  Baby 72 hours old. Mom reports that she has been giving bottles and doing some pumping. Baby cueing to nurse, so assisted with latching baby to right breast in football position. Baby fussy at breast and not willing to latch. Discussed infant behavior with mom as result of bottles and flow--milk in bottle vs breast milk. Assisted mom with pumping and mom able to collect 2 ml of EBM at right breast. Discussed progression of milk coming to volume and supply and demand. Mom states that she really wants to "keep trying to BF." Enc mom to keep putting baby to breast with cues, then supplement with EBM/formula. Enc mom to post-pump followed by hand expression. Enc mom to keep supplementing and pumping until baby closer to due date. Discussed EBM storage and mom aware of OP/BFSG and [place] phone line assistance after D/C.   Maternal Data    Feeding Feeding Type: Breast Fed Length of feed: 0 min  LATCH Score Latch: Too sleepy or reluctant, no latch achieved, no sucking elicited.  Audible Swallowing: None  Type of Nipple: Everted at rest and after stimulation  Comfort (Breast/Nipple): Soft / non-tender  Hold (Positioning): Assistance needed to correctly position infant at breast and maintain latch.  LATCH Score: 5  Interventions Interventions: Breast feeding basics reviewed;Assisted with latch;Skin to skin;Hand express;Breast compression;Adjust position;Support pillows;Position options;Coconut oil  Lactation Tools Discussed/Used     Consult Status Consult Status: PRN    Andres Labrum 10/01/2016, 10:20 AM

## 2016-10-01 NOTE — Progress Notes (Signed)
POSTOPERATIVE DAY # 3 S/P CS  S:         Reports feeling better this am - not as sore             Tolerating po intake / no nausea / no vomiting / + flatus / + BM             Bleeding is light             Pain controlled with motrin and occasional narcotic             Up ad lib / ambulatory/ voiding QS  Newborn formula feeding   O:  VS: BP 135/73   Pulse 79   Temp 97.8 F (36.6 C) (Oral)   Resp 18   Ht 5\' 10"  (1.778 m)   Wt (!) 142 kg (313 lb)   LMP 01/13/2016 (Exact Date)   SpO2 99%   Breastfeeding? Unknown   BMI 44.91 kg/m               BP 135/73 - 140/72 - 135/58 on Aldomet 250 TID                            Physical Exam:             Alert and Oriented X3  Lungs: Clear and unlabored  Heart: regular rate and rhythm / no mumurs  Abdomen: soft, non-tender, non-distended, active BS             Fundus: firm, non-tender, Ueven             Dressing intact              Incision:  approximated with suture / no erythema / no ecchymosis / no drainage  Perineum: intact  Lochia: light  Extremities: trace edema, no calf pain or tenderness, negative Homans  A:        POD # 3 S/P CS            Chronic hypertension stable on Aldomet 250 TID  P:        Routine postoperative care              DC home - [facility] booklet -instructions reviewed                   BP recheck at [facility] next week    Artelia Laroche CNM, MSN, [facility] 10/01/2016, 8:56 AM

## 2016-10-05 ENCOUNTER — Other Ambulatory Visit: Payer: Self-pay | Admitting: Family Medicine

## 2016-10-05 DIAGNOSIS — I10 Essential (primary) hypertension: Secondary | ICD-10-CM

## 2016-10-05 DIAGNOSIS — Z3201 Encounter for pregnancy test, result positive: Secondary | ICD-10-CM

## 2016-10-05 NOTE — Telephone Encounter (Signed)
Patient requesting refill of Methyldopa to [facility].

## 2016-11-07 ENCOUNTER — Other Ambulatory Visit: Payer: Self-pay | Admitting: Family Medicine

## 2016-11-07 DIAGNOSIS — Z3201 Encounter for pregnancy test, result positive: Secondary | ICD-10-CM

## 2016-11-07 DIAGNOSIS — I10 Essential (primary) hypertension: Secondary | ICD-10-CM

## 2016-11-12 ENCOUNTER — Other Ambulatory Visit: Payer: Self-pay | Admitting: Family Medicine

## 2016-11-12 DIAGNOSIS — I10 Essential (primary) hypertension: Secondary | ICD-10-CM

## 2016-11-16 ENCOUNTER — Ambulatory Visit (INDEPENDENT_AMBULATORY_CARE_PROVIDER_SITE_OTHER): Payer: 59 | Admitting: Family Medicine

## 2016-11-16 ENCOUNTER — Encounter: Payer: Self-pay | Admitting: Family Medicine

## 2016-11-16 VITALS — BP 130/76 | HR 72 | Temp 98.4°F | Resp 16 | Ht 70.0 in | Wt 293.7 lb

## 2016-11-16 DIAGNOSIS — L03116 Cellulitis of left lower limb: Secondary | ICD-10-CM

## 2016-11-16 DIAGNOSIS — I10 Essential (primary) hypertension: Secondary | ICD-10-CM

## 2016-11-16 DIAGNOSIS — Z23 Encounter for immunization: Secondary | ICD-10-CM

## 2016-11-16 DIAGNOSIS — A6000 Herpesviral infection of urogenital system, unspecified: Secondary | ICD-10-CM

## 2016-11-16 MED ORDER — DOXYCYCLINE HYCLATE 100 MG PO TABS: 100.0000 mg | ORAL_TABLET | Freq: Two times a day (BID) | ORAL | 0 refills | Status: AC

## 2016-11-16 MED ORDER — HYDROCHLOROTHIAZIDE 12.5 MG PO TABS: 12.5000 mg | ORAL_TABLET | Freq: Every day | ORAL | 0 refills | Status: DC

## 2016-11-16 NOTE — Patient Instructions (Addendum)
Please apply warm compress (Warm/Hot washcloth) 2-3 times a day.  Skin Abscess A skin abscess is an infected area on or under your skin that contains pus and other material. An abscess can happen almost anywhere on your body. Some abscesses break open (rupture) on their own. Most continue to get worse unless they are treated. The infection can spread deeper into the body and into your blood, which can make you feel sick. Treatment usually involves draining the abscess. Follow these instructions at home: Abscess Care  If you have an abscess that has not drained, place a warm, clean, wet washcloth over the abscess several times a day. Do this as told by your doctor.  Follow instructions from your doctor about how to take care of your abscess. Make sure you: ? Cover the abscess with a bandage (dressing). ? Change your bandage or gauze as told by your doctor. ? Wash your hands with soap and water before you change the bandage or gauze. If you cannot use soap and water, use hand sanitizer.  Check your abscess every day for signs that the infection is getting worse. Check for: ? More redness, swelling, or pain. ? More fluid or blood. ? Warmth. ? More pus or a bad smell. Medicines   Take over-the-counter and prescription medicines only as told by your doctor.  If you were prescribed an antibiotic medicine, take it as told by your doctor. Do not stop taking the antibiotic even if you start to feel better. General instructions  To avoid spreading the infection: ? Do not share personal care items, towels, or hot tubs with others. ? Avoid making skin-to-skin contact with other people.  Keep all follow-up visits as told by your doctor. This is important. Contact a doctor if:  You have more redness, swelling, or pain around your abscess.  You have more fluid or blood coming from your abscess.  Your abscess feels warm when you touch it.  You have more pus or a bad smell coming from your  abscess.  You have a fever.  Your muscles ache.  You have chills.  You feel sick. Get help right away if:  You have very bad (severe) pain.  You see red streaks on your skin spreading away from the abscess. This information is not intended to replace advice given to you by your health care provider. Make sure you discuss any questions you have with your health care provider. Document Released: 06/24/2007 Document Revised: 09/01/2015 Document Reviewed: 11/14/2014 Elsevier Interactive Patient Education  [employer].

## 2016-11-16 NOTE — Progress Notes (Signed)
Name: Michelle Ware   MRN: [id number]    DOB: 01/25/1980   Date:11/16/2016       Progress Note  Subjective  Chief Complaint  Chief Complaint  Patient presents with  . Abscess    on inner left thigh fir 1 week    HPI  Patient presents with concern for boil to LEFT inner thigh for several days.  Today she noticed that it is tender.  It came open today while feeding her infant son. She noted mostly sanguinous drainage.  No purulent drainage, fevers/chills, or red streaking from the area. She has a history of boils in the inner thigh and groin area and of genital herpes.   Needs flu shot today - we will administer.  HTN: Patient has HTN, delivered her son 7 weeks ago and is no longer on methyldopa - was given temporary supply of HCTZ 12.5mg  from OB/GYN to get her through to an appointment with her PCP. We will refill today.  She has physical with Dr. Ancil Boozer in December.  She is not breastfeeding.  BP is at goal today; no swelling, chest pain, shortness of breath, palpitations, fatigue, headaches, or blurred vision.   Patient Active Problem List   Diagnosis Date Noted  . Postpartum care following cesarean delivery (9/10) 09/28/2016  . S/P cesarean section: Indication: hx. of myomectomy and CHTN  09/28/2016  . Heart palpitations 04/28/2016  . Pre-existing essential hypertension during pregnancy in second trimester 04/28/2016  . Encounter for supervision of high risk multigravida of advanced maternal age, antepartum 03/31/2016  . Morbid obesity with BMI of 40.0-44.9, adult ([place]) 03/31/2016  . AMA (advanced maternal age) multigravida 22+, first trimester   . Thyroid nodule 07/07/2015  . H/O myomectomy 03/11/2015  . Essential hypertension 11/28/2014  . Obesity, Class III, BMI 40-49.9 (morbid obesity) ([place]) 11/28/2014  . Decreased thyroid stimulating hormone (TSH) level 07/24/2014  . Hypertrichosis 07/24/2014  . Vitamin D deficiency 07/24/2014  . Thyromegaly 07/24/2014  . PCOS (polycystic  ovarian syndrome) 07/24/2014  . History of abnormal cervical Pap smear 11/24/2011  . Genital herpes 11/29/2008    Social History  Substance Use Topics  . Smoking status: Never Smoker  . Smokeless tobacco: Never Used  . Alcohol use No     Current Outpatient Prescriptions:  .  ibuprofen (ADVIL,MOTRIN) 600 MG tablet, Take 1 tablet (600 mg total) by mouth every 6 (six) hours., Disp: 30 tablet, Rfl: 0 .  Vitamin D, Ergocalciferol, 2000 units CAPS, Take 2,000 Units by mouth daily., Disp: , Rfl:  .  ferrous sulfate 325 (65 FE) MG tablet, Take 1 tablet (325 mg total) by mouth 2 (two) times daily with a meal. (Patient not taking: Reported on 11/16/2016), Disp: 60 tablet, Rfl: 0 .  HYDROmorphone (DILAUDID) 4 MG tablet, Take 1 tablet (4 mg total) by mouth every 4 (four) hours as needed for severe pain. (Patient not taking: Reported on 11/16/2016), Disp: 20 tablet, Rfl: 0 .  magnesium oxide (MAG-OX) 400 (241.3 Mg) MG tablet, Take 1 tablet (400 mg total) by mouth daily. (Patient not taking: Reported on 11/16/2016), Disp: 45 tablet, Rfl: 0 .  methyldopa (ALDOMET) 250 MG tablet, TAKE 1 TABLET (250 MG TOTAL) BY MOUTH 3 (THREE) TIMES DAILY. (Patient not taking: Reported on 11/16/2016), Disp: 90 tablet, Rfl: 0 .  Prenat w/o A-FeCbGl-DSS-FA-DHA (CITRANATAL 90 DHA) 90-1 & 300 MG MISC, TAKE 1 TABLET AND TAKE ONE CAPSULE EVERY DAY, Disp: , Rfl: 4  Allergies  Allergen Reactions  . Hydrocodone Itching  ROS  Constitutional: Negative for fever or weight change.  Respiratory: Negative for cough and shortness of breath.   Cardiovascular: Negative for chest pain or palpitations.  Gastrointestinal: Negative for abdominal pain, no bowel changes.  Musculoskeletal: Negative for gait problem or joint swelling.  Skin: Negative for rash. See HPI Neurological: Negative for dizziness or headache.  No other specific complaints in a complete review of systems (except as listed in HPI above).  Objective  Vitals:    11/16/16 1330  BP: 130/76  Pulse: 72  Resp: 16  Temp: 98.4 F (36.9 C)  TempSrc: Oral  SpO2: 97%  Weight: 293 lb 11.2 oz (133.2 kg)  Height: 5\' 10"  (1.778 m)   Body mass index is 42.14 kg/m.  Nursing Note and Vital Signs reviewed.  Physical Exam  Constitutional: Patient appears well-developed and well-nourished. Obese. No distress.  HEENT: head atraumatic, normocephalic Cardiovascular: Normal rate, regular rhythm, S1/S2 present.  No murmur or rub heard. No BLE edema. Pulmonary/Chest: Effort normal and breath sounds clear. No respiratory distress or retractions. Psychiatric: Patient has a normal mood and affect. behavior is normal. Judgment and thought content normal. Skin: No rashes. LEFT upper medial thigh exhibits firm, mildly tender, abscess with surrounding mild erythema. No fluctuance. Central erythematous opening with scant sanguinous discharge. No purulent drainage or red streaking.  Recent Results (from the past 2160 hour(s))  CBC     Status: Abnormal   Collection Time: 09/25/16  1:20 PM  Result Value Ref Range   WBC 8.5 4.0 - 10.5 K/uL   RBC 4.21 3.87 - 5.11 MIL/uL   Hemoglobin 10.7 (L) 12.0 - 15.0 g/dL   HCT 32.7 (L) 36.0 - 46.0 %   MCV 77.7 (L) 78.0 - 100.0 fL   MCH 25.4 (L) 26.0 - 34.0 pg   MCHC 32.7 30.0 - 36.0 g/dL   RDW 16.9 (H) 11.5 - 15.5 %   Platelets 231 150 - 400 K/uL  RPR     Status: None   Collection Time: 09/25/16  1:20 PM  Result Value Ref Range   RPR Ser Ql Non Reactive Non Reactive    Comment: (NOTE) Performed At: [facility] [place], [place] [id number] Lindon Romp MD [id number]   Type and screen     Status: None   Collection Time: 09/25/16  1:20 PM  Result Value Ref Range   ABO/RH(D) O POS    Antibody Screen NEG    Sample Expiration 09/28/2016   ABO/Rh     Status: None   Collection Time: 09/25/16  1:20 PM  Result Value Ref Range   ABO/RH(D) O POS   HIV antibody     Status: None   Collection Time:  09/28/16  8:00 AM  Result Value Ref Range   HIV Screen 4th Generation wRfx Non Reactive Non Reactive    Comment: (NOTE) Performed At: [facility] [place], [place] [id number] Lindon Romp MD OF:[id number]   CBC     Status: Abnormal   Collection Time: 09/29/16  6:41 AM  Result Value Ref Range   WBC 13.6 (H) 4.0 - 10.5 K/uL   RBC 3.33 (L) 3.87 - 5.11 MIL/uL   Hemoglobin 8.7 (L) 12.0 - 15.0 g/dL   HCT 26.0 (L) 36.0 - 46.0 %   MCV 78.1 78.0 - 100.0 fL   MCH 26.1 26.0 - 34.0 pg   MCHC 33.5 30.0 - 36.0 g/dL   RDW 17.0 (H) 11.5 - 15.5 %  Platelets 192 150 - 400 K/uL     Assessment & Plan  1. Cellulitis of left lower extremity - doxycycline (VIBRA-TABS) 100 MG tablet; Take 1 tablet (100 mg total) by mouth 2 (two) times daily.  Dispense: 14 tablet; Refill: 0 - Advised warm/hot compresses 2-3 times daily.   2. Essential hypertension - hydrochlorothiazide (HYDRODIURIL) 12.5 MG tablet; Take 1 tablet (12.5 mg total) by mouth daily.  Dispense: 60 tablet; Refill: 0  3. Need for influenza vaccination - Flu Vaccine QUAD 6+ mos PF IM (Fluarix Quad PF)  4. Genital herpes simplex, unspecified site Advised cellulitis/abscess does not appear to be related to HSV infection. Continue to monitor for HSV exacerbation infection and stress can cause outbreaks to occur.  -Red flags and when to present for emergency care or RTC including fever >101.72F, chest pain, shortness of breath, new/worsening/un-resolving symptoms, red streaking, swelling to area, reviewed with patient at time of visit. Follow up and care instructions discussed and provided in AVS.

## 2016-12-04 ENCOUNTER — Encounter: Payer: Self-pay | Admitting: Family Medicine

## 2016-12-04 ENCOUNTER — Ambulatory Visit: Payer: 59 | Admitting: Family Medicine

## 2016-12-04 VITALS — BP 120/72 | HR 86 | Temp 98.5°F | Resp 16 | Ht 70.0 in | Wt 296.5 lb

## 2016-12-04 DIAGNOSIS — R142 Eructation: Secondary | ICD-10-CM | POA: Diagnosis not present

## 2016-12-04 DIAGNOSIS — R141 Gas pain: Secondary | ICD-10-CM | POA: Diagnosis not present

## 2016-12-04 DIAGNOSIS — R143 Flatulence: Secondary | ICD-10-CM | POA: Diagnosis not present

## 2016-12-04 DIAGNOSIS — R109 Unspecified abdominal pain: Secondary | ICD-10-CM | POA: Diagnosis not present

## 2016-12-04 MED ORDER — DICYCLOMINE HCL 20 MG PO TABS: 20.0000 mg | ORAL_TABLET | Freq: Three times a day (TID) | ORAL | 0 refills | Status: DC | PRN

## 2016-12-04 NOTE — Progress Notes (Signed)
Name: Michelle Ware   MRN: [id number]    DOB: 08/25/1980   Date:12/04/2016       Progress Note  Subjective  Chief Complaint  Chief Complaint  Patient presents with  . Abdominal Pain    pain moves to back with gas    HPI  Patient presents with 4 days of new onset lower abdominal pain - it worsened yesterday and pain moved to her back; today it's a little better and only a small amount of back pain.  Worse when bending over and picking up her 19 month old son and when she raises her legs.  She has also noticed increased flatulence and belching.  She denies nausea, vomiting, or diarrhea, she denies constipation - Last BM was this morning and was normal - no blood in stool/no dark and tarry stools. No urinary symptoms - no dysuria, frequency/urgency, no hematuria.  Patient had C-section 2 months ago, and states that scar has been healing without issue and has not had any issues with her abdominal muscles.  Patient is not breastfeeding.  Patient Active Problem List   Diagnosis Date Noted  . Postpartum care following cesarean delivery (9/10) 09/28/2016  . S/P cesarean section: Indication: hx. of myomectomy and CHTN  09/28/2016  . Tenosynovitis of wrist 05/21/2016  . Heart palpitations 04/28/2016  . Morbid obesity with BMI of 40.0-44.9, adult ([place]) 03/31/2016  . Thyroid nodule 07/07/2015  . H/O myomectomy 03/11/2015  . Essential hypertension 11/28/2014  . Obesity, Class III, BMI 40-49.9 (morbid obesity) ([place]) 11/28/2014  . Decreased thyroid stimulating hormone (TSH) level 07/24/2014  . Hypertrichosis 07/24/2014  . Vitamin D deficiency 07/24/2014  . Thyromegaly 07/24/2014  . PCOS (polycystic ovarian syndrome) 07/24/2014  . History of abnormal cervical Pap smear 11/24/2011  . Genital herpes 11/29/2008    Social History   Tobacco Use  . Smoking status: Never Smoker  . Smokeless tobacco: Never Used  Substance Use Topics  . Alcohol use: No    Alcohol/week: 0.0 oz     Current  Outpatient Medications:  .  hydrochlorothiazide (HYDRODIURIL) 12.5 MG tablet, Take 1 tablet (12.5 mg total) by mouth daily., Disp: 60 tablet, Rfl: 0 .  ibuprofen (ADVIL,MOTRIN) 600 MG tablet, Take 1 tablet (600 mg total) by mouth every 6 (six) hours., Disp: 30 tablet, Rfl: 0 .  Vitamin D, Ergocalciferol, 2000 units CAPS, Take 2,000 Units by mouth daily., Disp: , Rfl:  .  valACYclovir (VALTREX) 500 MG tablet, Take 500 mg 2 (two) times daily by mouth., Disp: , Rfl: 1  Allergies  Allergen Reactions  . Hydrocodone Itching    ROS  Ten systems reviewed and is negative except as mentioned in HPI  Objective  Vitals:   12/04/16 1337  BP: 120/72  Pulse: 86  Resp: 16  Temp: 98.5 F (36.9 C)  TempSrc: Oral  SpO2: 96%  Weight: 296 lb 8 oz (134.5 kg)  Height: 5\' 10"  (1.778 m)   Body mass index is 42.54 kg/m.  Nursing Note and Vital Signs reviewed.  Physical Exam  Constitutional: Patient appears well-developed and well-nourished. Obese No distress.  HEENT: head atraumatic, normocephalic Cardiovascular: Normal rate, regular rhythm, S1/S2 present.  No murmur or rub heard. No BLE edema. Pulmonary/Chest: Effort normal and breath sounds clear. No respiratory distress or retractions. Abdominal: Soft and non-tender to palpation, bowel sounds present x4 quadrants.  No CVA Tenderness Psychiatric: Patient has a normal mood and affect. behavior is normal. Judgment and thought content normal.  Recent Results (from the  past 2160 hour(s))  CBC     Status: Abnormal   Collection Time: 09/25/16  1:20 PM  Result Value Ref Range   WBC 8.5 4.0 - 10.5 K/uL   RBC 4.21 3.87 - 5.11 MIL/uL   Hemoglobin 10.7 (L) 12.0 - 15.0 g/dL   HCT 32.7 (L) 36.0 - 46.0 %   MCV 77.7 (L) 78.0 - 100.0 fL   MCH 25.4 (L) 26.0 - 34.0 pg   MCHC 32.7 30.0 - 36.0 g/dL   RDW 16.9 (H) 11.5 - 15.5 %   Platelets 231 150 - 400 K/uL  RPR     Status: None   Collection Time: 09/25/16  1:20 PM  Result Value Ref Range   RPR Ser Ql  Non Reactive Non Reactive    Comment: (NOTE) Performed At: [facility] [place], [place] [id number] Lindon Romp MD [id number]   Type and screen     Status: None   Collection Time: 09/25/16  1:20 PM  Result Value Ref Range   ABO/RH(D) O POS    Antibody Screen NEG    Sample Expiration 09/28/2016   ABO/Rh     Status: None   Collection Time: 09/25/16  1:20 PM  Result Value Ref Range   ABO/RH(D) O POS   HIV antibody     Status: None   Collection Time: 09/28/16  8:00 AM  Result Value Ref Range   HIV Screen 4th Generation wRfx Non Reactive Non Reactive    Comment: (NOTE) Performed At: [facility] [place], [place] [id number] Lindon Romp MD [id number]   CBC     Status: Abnormal   Collection Time: 09/29/16  6:41 AM  Result Value Ref Range   WBC 13.6 (H) 4.0 - 10.5 K/uL   RBC 3.33 (L) 3.87 - 5.11 MIL/uL   Hemoglobin 8.7 (L) 12.0 - 15.0 g/dL   HCT 26.0 (L) 36.0 - 46.0 %   MCV 78.1 78.0 - 100.0 fL   MCH 26.1 26.0 - 34.0 pg   MCHC 33.5 30.0 - 36.0 g/dL   RDW 17.0 (H) 11.5 - 15.5 %   Platelets 192 150 - 400 K/uL     Assessment & Plan  1. Flatulence, eructation and gas pain - Discussed OTC remedies including gas-x and beano.  Also discussed dietary modifications to decrease symptoms.  2. Abdominal cramping - dicyclomine (BENTYL) 20 MG tablet; Take 1 tablet (20 mg total) 3 (three) times daily as needed by mouth for spasms.  Dispense: 20 tablet; Refill: 0  - Reassured pt that her symptoms appear to be improving from yesterday's pain, her abdomen is soft, and non-tender, and she is not exhibiting any acutely emergent symptoms at this time. We will trial the plan above, and if she worsens or does not improve, she will RTC based on red flags below.  -Red flags and when to present for emergency care or RTC including fever >101.7F, chest pain, shortness of breath, severe abdominal/back pain, urinary symptoms, bowel changes,  nausea/vomiting, new/worsening/un-resolving symptoms, reviewed with patient at time of visit. Follow up and care instructions discussed and provided in AVS.

## 2016-12-04 NOTE — Patient Instructions (Addendum)
Abdominal Pain, Adult Many things can cause belly (abdominal) pain. Most times, belly pain is not dangerous. Many cases of belly pain can be watched and treated at home. Sometimes belly pain is serious, though. Your doctor will try to find the cause of your belly pain. Follow these instructions at home:  Take over-the-counter and prescription medicines only as told by your doctor. Do not take medicines that help you poop (laxatives) unless told to by your doctor.  Drink enough fluid to keep your pee (urine) clear or pale yellow.  Watch your belly pain for any changes.  Keep all follow-up visits as told by your doctor. This is important. Contact a doctor if:  Your belly pain changes or gets worse.  You are not hungry, or you lose weight without trying.  You are having trouble pooping (constipated) or have watery poop (diarrhea) for more than 2-3 days.  You have pain when you pee or poop.  Your belly pain wakes you up at night.  Your pain gets worse with meals, after eating, or with certain foods.  You are throwing up and cannot keep anything down.  You have a fever. Get help right away if:  Your pain does not go away as soon as your doctor says it should.  You cannot stop throwing up.  Your pain is only in areas of your belly, such as the right side or the left lower part of the belly.  You have bloody or black poop, or poop that looks like tar.  You have very bad pain, cramping, or bloating in your belly.  You have signs of not having enough fluid or water in your body (dehydration), such as: ? Dark pee, very little pee, or no pee. ? Cracked lips. ? Dry mouth. ? Sunken eyes. ? Sleepiness. ? Weakness. This information is not intended to replace advice given to you by your health care provider. Make sure you discuss any questions you have with your health care provider. Document Released: 06/24/2007 Document Revised: 07/26/2015 Document Reviewed: 06/19/2015 Elsevier  Interactive Patient Education  2017 [place] is a feeling of pain, discomfort, burning, or fullness in the upper part of your belly (abdomen). It can come and go. It may occur often or rarely. Indigestion tends to happen while you are eating or right after you have finished eating. It may be worse at night and while bending over or lying down. Follow these instructions at home: Take these actions to lessen your pain or discomfort and to help avoid problems. Diet  Follow a diet as told by your doctor. You may need to avoid foods and drinks such as: ? Coffee and tea (with or without caffeine). ? Drinks that contain alcohol. ? Energy drinks and sports drinks. ? Carbonated drinks or sodas. ? Chocolate and cocoa. ? Peppermint and mint flavorings. ? Garlic and onions. ? Horseradish. ? Spicy and acidic foods, such as peppers, chili powder, curry powder, vinegar, hot sauces, and BBQ sauce. ? Citrus fruit juices and citrus fruits, such as oranges, lemons, and limes. ? Tomato-based foods, such as red sauce, chili, salsa, and pizza with red sauce. ? Fried and fatty foods, such as donuts, french fries, potato chips, and high-fat dressings. ? High-fat meats, such as hot dogs, rib eye steak, sausage, ham, and bacon. ? High-fat dairy items, such as whole milk, butter, and cream cheese.  Eat small meals often. Avoid eating large meals.  Avoid drinking large amounts of liquid with your  meals.  Avoid eating meals during the 2-3 hours before bedtime.  Avoid lying down right after you eat.  Do not exercise right after you eat. General instructions  Pay attention to any changes in your symptoms.  Take over-the-counter and prescription medicines only as told by your doctor. Do not take aspirin, ibuprofen, or other NSAIDs unless your doctor says it is okay.  Do not use any tobacco products, including cigarettes, chewing tobacco, and e-cigarettes. If you need help  quitting, ask your doctor.  Wear loose clothes. Do not wear anything tight around your waist.  Raise (elevate) the head of your bed about 6 inches (15 cm).  Try to lower your stress. If you need help doing this, ask your doctor.  If you are overweight, lose an amount of weight that is healthy for you. Ask your doctor about a safe weight loss goal.  Keep all follow-up visits as told by your doctor. This is important. Contact a doctor if:  You have new symptoms.  You lose weight and you do not know why it is happening.  You have trouble swallowing, or it hurts to swallow.  Your symptoms do not get better with treatment.  Your symptoms last for more than two days.  You have a fever.  You throw up (vomit). Get help right away if:  You have pain in your arms, neck, jaw, teeth, or back.  You feel sweaty, dizzy, or light-headed.  You pass out (faint).  You have chest pain or shortness of breath.  You cannot stop throwing up, or you throw up blood.  Your poop (stool) is bloody or black.  You have very bad pain in your belly. This information is not intended to replace advice given to you by your health care provider. Make sure you discuss any questions you have with your health care provider. Document Released: 02/07/2010 Document Revised: 06/13/2015 Document Reviewed: 05/02/2014 Elsevier Interactive Patient Education  [employer].

## 2016-12-22 ENCOUNTER — Encounter: Payer: Self-pay | Admitting: Family Medicine

## 2016-12-22 ENCOUNTER — Ambulatory Visit (INDEPENDENT_AMBULATORY_CARE_PROVIDER_SITE_OTHER): Payer: 59 | Admitting: Family Medicine

## 2016-12-22 VITALS — BP 132/86 | HR 78 | Temp 98.1°F | Resp 12 | Ht 69.0 in | Wt 298.1 lb

## 2016-12-22 DIAGNOSIS — N944 Primary dysmenorrhea: Secondary | ICD-10-CM | POA: Diagnosis not present

## 2016-12-22 DIAGNOSIS — E010 Iodine-deficiency related diffuse (endemic) goiter: Secondary | ICD-10-CM | POA: Diagnosis not present

## 2016-12-22 DIAGNOSIS — Z01419 Encounter for gynecological examination (general) (routine) without abnormal findings: Secondary | ICD-10-CM | POA: Diagnosis not present

## 2016-12-22 DIAGNOSIS — D508 Other iron deficiency anemias: Secondary | ICD-10-CM

## 2016-12-22 DIAGNOSIS — M65949 Unspecified synovitis and tenosynovitis, unspecified hand: Secondary | ICD-10-CM

## 2016-12-22 DIAGNOSIS — M659 Synovitis and tenosynovitis, unspecified: Secondary | ICD-10-CM | POA: Diagnosis not present

## 2016-12-22 DIAGNOSIS — E66813 Obesity, class 3: Secondary | ICD-10-CM

## 2016-12-22 DIAGNOSIS — I10 Essential (primary) hypertension: Secondary | ICD-10-CM

## 2016-12-22 DIAGNOSIS — E8881 Metabolic syndrome: Secondary | ICD-10-CM | POA: Diagnosis not present

## 2016-12-22 MED ORDER — SEMAGLUTIDE(0.25 OR 0.5MG/DOS) 2 MG/1.5ML ~~LOC~~ SOPN: 0.5000 mg | PEN_INJECTOR | SUBCUTANEOUS | 2 refills | Status: DC

## 2016-12-22 MED ORDER — HYDROCHLOROTHIAZIDE 12.5 MG PO TABS: 12.5000 mg | ORAL_TABLET | Freq: Every day | ORAL | 1 refills | Status: DC

## 2016-12-22 NOTE — Progress Notes (Signed)
Name: Oneka Parada   MRN: [id number]    DOB: Oct 16, 1980   Date:12/22/2016       Progress Note  Subjective  Chief Complaint  Chief Complaint  Patient presents with  . Annual Exam  . Hypertension  . Obesity    HPI   Patient presents for annual CPE and regular follow up  Well Woman: she is not sexually active, no vaginal problems or discharge, no recent herpes outbreaks and only takes medication prn. She delivered her first son by cesarean 09/2016 and has not had a cycle since - advised to contact OB if no cycles after 3 months of delivery. No breast lumps. She has a history of abnormal pap smear, but remotely, had a pap smear done recently by OB and per patient was normal ( records not available)   Thyroid goiter: saw  Dr. Gabriel Carina, biopsy negative, she has a large goiter and advised to have thyroidectomy seeing surgeon next week.   HTN: taking medication daily, no chest pain, palpitation, no side effects of medication, only on HCTZ at this time.   Obesity: she has a long history of obesity, started at middle school. She lost weight on her senior year of HS - by eating lean cuisine meals, but gained it back a few years later. Highest weight was low 300lbs in High School. Weight has been stable over the past year. She wants to lose weight again. She has not been physically but will resume it. She has been eating fast food but will start planning meals.   Metabolic syndrome/PCOS: she has hypertrichosis, oligomenorrhea ( since delivery), acanthosis nigricans, increase in abdominal girth and also HTN, hgb A1C has been normal and there is no history of DM in her family. No polyphagia, polydipsia or polyuria.   S/p Myomectomy in Feb 2017, doing well, cycles are back to normal. She had it done after miscarriage last fall. She lost blood during the procedure and was anemic, she has stopped taking supplements because of constipation, also found to stay anemic throughout her pregnancy and had iron  infusion in her third trimester. She is not taking any supplements at this time.  Tenosynovitis: started April 2018 seen Ortho, was given steroid injection and improved symptoms for a couple a months, we will try brace and topical medication   Exercise: not currently exercising, but will resume walking with her son  USPSTF grade A and B recommendations  Depression:  Depression screen [facility] 2/9 12/22/2016 02/25/2016 12/20/2015 05/23/2015 10/17/2014  Decreased Interest 0 0 0 0 0  Down, Depressed, Hopeless 0 0 0 0 0  PHQ - 2 Score 0 0 0 0 0   Hypertension: BP Readings from Last 3 Encounters:  12/22/16 132/86  12/04/16 120/72  11/16/16 130/76   Obesity: Wt Readings from Last 3 Encounters:  12/22/16 298 lb 1.6 oz (135.2 kg)  12/04/16 296 lb 8 oz (134.5 kg)  11/16/16 293 lb 11.2 oz (133.2 kg)   BMI Readings from Last 3 Encounters:  12/22/16 44.02 kg/m  12/04/16 42.54 kg/m  11/16/16 42.14 kg/m    Alcohol: seldom Tobacco use: none HIV, hep B, hep C: up to date STD testing and prevention (chl/gon/syphilis): no intercourse since pregnancy and negative tests Intimate partner violence: negative Sexual History/Pain during Intercourse:not currently, discussed returning for contraception if she decides to become sexually active, always use condoms  Menstrual History/LMP/Abnormal Bleeding: no cycles since delivery and post-partum bleed.  Incontinence Symptoms: no bladder problems    Advanced Care Planning: A  voluntary discussion about advance care planning including the explanation and discussion of advance directives.  Discussed health care proxy and Living will, and the patient was able to identify a health care proxy as her parents  Patient does not have a living will at present time. If patient does have living will, I have requested they bring this to the clinic to be scanned in to their chart.  Breast cancer: not indicated based on age and risk factors  Cervical cancer screening: we  will get copy of results  Lipids:  Lab Results  Component Value Date   CHOL 154 11/22/2012   Lab Results  Component Value Date   HDL 62 11/22/2012   Lab Results  Component Value Date   LDLCALC 83 11/22/2012   Lab Results  Component Value Date   TRIG 44 11/22/2012   No results found for: CHOLHDL No results found for: LDLDIRECT  Glucose:  Glucose  Date Value Ref Range Status  04/07/2016 84 65 - 99 mg/dL Final    Colorectal cancer: we will check hemoccult stools because of anemia and if positive refer for colonoscopy  ECG:04/2016   Patient Active Problem List   Diagnosis Date Noted  . S/P cesarean section: Indication: hx. of myomectomy and CHTN  09/28/2016  . Tenosynovitis of wrist 05/21/2016  . Heart palpitations 04/28/2016  . Morbid obesity with BMI of 40.0-44.9, adult ([place]) 03/31/2016  . Thyroid nodule 07/07/2015  . H/O myomectomy 03/11/2015  . Essential hypertension 11/28/2014  . Obesity, Class III, BMI 40-49.9 (morbid obesity) ([place]) 11/28/2014  . Decreased thyroid stimulating hormone (TSH) level 07/24/2014  . Hypertrichosis 07/24/2014  . Vitamin D deficiency 07/24/2014  . Thyromegaly 07/24/2014  . PCOS (polycystic ovarian syndrome) 07/24/2014  . History of abnormal cervical Pap smear 11/24/2011  . Genital herpes 11/29/2008    Past Surgical History:  Procedure Laterality Date  . BIOPSY THYROID  07/2015   Dr. Gabriel Carina  . CESAREAN SECTION N/A 09/28/2016   Procedure: Primary CESAREAN SECTION;  Surgeon: Servando Salina, MD;  Location: [facility];  Service: Obstetrics;  Laterality: N/A;  EDD: 10/19/16 Allergy: Hydrocodone-Acetaminophen  . LAPAROSCOPIC GELPORT ASSISTED MYOMECTOMY N/A 03/11/2015   Procedure: LAPAROSCOPIC  MYOMECTOMY--attempted;  Surgeon: Rubie Maid, MD;  Location: [facility];  Service: Gynecology;  Laterality: N/A;  . LAPAROTOMY N/A 03/11/2015   Procedure: LAPAROTOMY--MYOMECTOMY;  Surgeon: Rubie Maid, MD;  Location: [facility];  Service:  Gynecology;  Laterality: N/A;  . MOUTH SURGERY  1996    Family History  Problem Relation Age of Onset  . Thyroid disease Mother   . Hypertension Mother   . Cancer Maternal Aunt   . Cancer Maternal Grandfather     Social History   Socioeconomic History  . Marital status: Single    Spouse name: Not on file  . Number of children: 1  . Years of education: Not on file  . Highest education level: Bachelor's degree (e.g., BA, AB, BS)  Social Needs  . Financial resource strain: Not hard at all  . Food insecurity - worry: Never true  . Food insecurity - inability: Never true  . Transportation needs - medical: No  . Transportation needs - non-medical: No  Occupational History  . Occupation: Glass blower/designer    Comment: works for [employer] in [employer]  . Smoking status: Never Smoker  . Smokeless tobacco: Never Used  Substance and Sexual Activity  . Alcohol use: Yes    Comment: occasionally, about once a month  .  Drug use: No  . Sexual activity: Not Currently    Partners: Male    Birth control/protection: Condom, Abstinence  Other Topics Concern  . 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     Current Outpatient Medications:  .  hydrochlorothiazide (HYDRODIURIL) 12.5 MG tablet, Take 1 tablet (12.5 mg total) by mouth daily., Disp: 90 tablet, Rfl: 1 .  ibuprofen (ADVIL,MOTRIN) 600 MG tablet, Take 1 tablet (600 mg total) by mouth every 6 (six) hours., Disp: 30 tablet, Rfl: 0 .  Semaglutide (OZEMPIC) 0.25 or 0.5 MG/DOSE SOPN, Inject 0.5 mg into the skin once a week., Disp: 2 pen, Rfl: 2 .  valACYclovir (VALTREX) 500 MG tablet, Take 500 mg 2 (two) times daily by mouth., Disp: , Rfl: 1 .  Vitamin D, Ergocalciferol, 2000 units CAPS, Take 2,000 Units by mouth daily., Disp: , Rfl:    Allergies  Allergen Reactions  . Hydrocodone Itching     ROS   Constitutional: Negative for fever or weight change.  Respiratory: Negative for cough and shortness of breath.   Cardiovascular: Negative for chest pain or palpitations.  Gastrointestinal: Negative for abdominal pain, no bowel changes.  Musculoskeletal: Negative for gait problem or joint swelling.  Skin: Negative for rash.  Neurological: Negative for dizziness or headache.  No other specific complaints in a complete review of systems (except as listed in HPI above).   Objective  Vitals:   12/22/16 0841  BP: 132/86  Pulse: 78  Resp: 12  Temp: 98.1 F (36.7 C)  TempSrc: Oral  SpO2: 97%  Weight: 298 lb 1.6 oz (135.2 kg)  Height: 5\' 9"  (1.753 m)    Body mass index is 44.02 kg/m.  Physical Exam  Constitutional: Patient appears well-developed and obese  No distress.  HENT: Head: Normocephalic and atraumatic. Ears: B TMs ok, no erythema or effusion; Nose: Nose normal. Mouth/Throat: Oropharynx is clear and moist. No oropharyngeal exudate.  Eyes: Conjunctivae and EOM are normal. Pupils are equal, round, and reactive to light. No scleral icterus.  Neck: Normal range of motion. Neck supple. No JVD present.  thyromegaly present.  Cardiovascular: Normal rate, regular rhythm and normal heart sounds.  No murmur heard. No BLE edema. Pulmonary/Chest: Effort normal and breath sounds normal. No respiratory distress. Abdominal: Soft. Bowel sounds are normal, no distension. There is no tenderness. no masses Breast: no lumps or masses, no nipple discharge or rashes FEMALE GENITALIA:  Not done RECTAL: not done Musculoskeletal: Normal range of motion, no joint effusions. No gross deformities Neurological: he is alert and oriented to person, place, and time. No cranial nerve deficit. Coordination, balance, strength, speech and gait are normal.  Skin: Skin is warm and dry. No rash noted. No erythema.  Psychiatric: Patient  has a normal mood and affect. behavior is normal. Judgment and thought content normal.   Recent Results (from the past 2160 hour(s))  CBC     Status: Abnormal   Collection Time: 09/25/16  1:20 PM  Result Value Ref Range   WBC 8.5 4.0 - 10.5 K/uL   RBC 4.21 3.87 - 5.11 MIL/uL   Hemoglobin 10.7 (L) 12.0 - 15.0 g/dL   HCT 32.7 (L) 36.0 - 46.0 %   MCV 77.7 (L) 78.0 - 100.0 fL   MCH 25.4 (L)  26.0 - 34.0 pg   MCHC 32.7 30.0 - 36.0 g/dL   RDW 16.9 (H) 11.5 - 15.5 %   Platelets 231 150 - 400 K/uL  RPR     Status: None   Collection Time: 09/25/16  1:20 PM  Result Value Ref Range   RPR Ser Ql Non Reactive Non Reactive    Comment: (NOTE) Performed At: [facility] [place], [place] [id number] Lindon Romp MD [id number]   Type and screen     Status: None   Collection Time: 09/25/16  1:20 PM  Result Value Ref Range   ABO/RH(D) O POS    Antibody Screen NEG    Sample Expiration 09/28/2016   ABO/Rh     Status: None   Collection Time: 09/25/16  1:20 PM  Result Value Ref Range   ABO/RH(D) O POS   HIV antibody     Status: None   Collection Time: 09/28/16  8:00 AM  Result Value Ref Range   HIV Screen 4th Generation wRfx Non Reactive Non Reactive    Comment: (NOTE) Performed At: [facility] [place], [place] [id number] Lindon Romp MD [id number]   CBC     Status: Abnormal   Collection Time: 09/29/16  6:41 AM  Result Value Ref Range   WBC 13.6 (H) 4.0 - 10.5 K/uL   RBC 3.33 (L) 3.87 - 5.11 MIL/uL   Hemoglobin 8.7 (L) 12.0 - 15.0 g/dL   HCT 26.0 (L) 36.0 - 46.0 %   MCV 78.1 78.0 - 100.0 fL   MCH 26.1 26.0 - 34.0 pg   MCHC 33.5 30.0 - 36.0 g/dL   RDW 17.0 (H) 11.5 - 15.5 %   Platelets 192 150 - 400 K/uL     PHQ2/9: Depression screen [facility] 2/9 12/22/2016 02/25/2016 12/20/2015 05/23/2015 10/17/2014  Decreased Interest 0 0 0 0 0  Down, Depressed, Hopeless 0 0 0 0 0  PHQ - 2 Score 0 0 0 0 0    Fall Risk: Fall Risk  12/22/2016  02/25/2016 12/20/2015 05/23/2015 10/17/2014  Falls in the past year? No No No No No    Functional Status Survey: Is the patient deaf or have difficulty hearing?: No Does the patient have difficulty seeing, even when wearing glasses/contacts?: No Does the patient have difficulty concentrating, remembering, or making decisions?: No Does the patient have difficulty walking or climbing stairs?: No Does the patient have difficulty dressing or bathing?: No Does the patient have difficulty doing errands alone such as visiting a doctor's office or shopping?: No   Assessment & Plan  1. Well woman exam  - Lipid panel  2. Essential hypertension  - COMPLETE METABOLIC PANEL WITH GFR - hydrochlorothiazide (HYDRODIURIL) 12.5 MG tablet; Take 1 tablet (12.5 mg total) by mouth daily.  Dispense: 90 tablet; Refill: 1  3. Primary dysmenorrhea  Continue prn ibuprofen   4. Tenosynovitis of thumb  - discussed brace, can use topical medication otc  5. Thyromegaly  Having thyroidectomy next week    6. Other iron deficiency anemia  - CBC with Differential/Platelet - Iron, TIBC and Ferritin Panel -hemoccult cards X3   7. Metabolic syndrome  - Hemoglobin A1c - Insulin, random - Semaglutide (OZEMPIC) 0.25 or 0.5 MG/DOSE SOPN; Inject 0.5 mg into the skin once a week.  Dispense: 2 pen; Refill: 2 She denies family history of thyroid cancer or personal history of pancreatitis  8. Obesity, Class III, BMI 40-49.9 (morbid obesity) ([place])  Discussed with  the patient the risk posed by an increased BMI. Discussed importance of portion control, calorie counting and at least 150 minutes of physical activity weekly. Avoid sweet beverages and drink more water. Eat at least 6 servings of fruit and vegetables daily

## 2016-12-23 LAB — COMPLETE METABOLIC PANEL WITH GFR
AG RATIO: 1.4 (calc) (ref 1.0–2.5)
ALBUMIN MSPROF: 4.3 g/dL (ref 3.6–5.1)
ALT: 11 U/L (ref 6–29)
AST: 15 U/L (ref 10–30)
Alkaline phosphatase (APISO): 57 U/L (ref 33–115)
BILIRUBIN TOTAL: 0.3 mg/dL (ref 0.2–1.2)
BUN: 15 mg/dL (ref 7–25)
CHLORIDE: 104 mmol/L (ref 98–110)
CO2: 28 mmol/L (ref 20–32)
Calcium: 9.6 mg/dL (ref 8.6–10.2)
Creat: 0.95 mg/dL (ref 0.50–1.10)
GFR, EST AFRICAN AMERICAN: 88 mL/min/{1.73_m2} (ref >=60)
GFR, EST NON AFRICAN AMERICAN: 76 mL/min/{1.73_m2} (ref >=60)
Globulin: 3 g/dL (calc) (ref 1.9–3.7)
Glucose, Bld: 83 mg/dL (ref 65–99)
POTASSIUM: 4.2 mmol/L (ref 3.5–5.3)
Sodium: 137 mmol/L (ref 135–146)
TOTAL PROTEIN: 7.3 g/dL (ref 6.1–8.1)

## 2016-12-23 LAB — IRON,TIBC AND FERRITIN PANEL
%SAT: 22 % (calc) (ref 11–50)
FERRITIN: 97 ng/mL (ref 10–154)
IRON: 72 ug/dL (ref 40–190)
TIBC: 322 ug/dL (ref 250–450)

## 2016-12-23 LAB — CBC WITH DIFFERENTIAL/PLATELET
BASOS PCT: 0.6 %
Basophils Absolute: 40 cells/uL (ref 0–200)
EOS ABS: 67 {cells}/uL (ref 15–500)
Eosinophils Relative: 1 %
HEMATOCRIT: 37.1 % (ref 35.0–45.0)
HEMOGLOBIN: 12 g/dL (ref 11.7–15.5)
LYMPHS ABS: 2841 {cells}/uL (ref 850–3900)
MCH: 25.3 pg — AB (ref 27.0–33.0)
MCHC: 32.3 g/dL (ref 32.0–36.0)
MCV: 78.1 fL — AB (ref 80.0–100.0)
MPV: 10.1 fL (ref 7.5–12.5)
Monocytes Relative: 8.2 %
NEUTROS ABS: 3203 {cells}/uL (ref 1500–7800)
Neutrophils Relative %: 47.8 %
Platelets: 276 10*3/uL (ref 140–400)
RBC: 4.75 10*6/uL (ref 3.80–5.10)
RDW: 13.1 % (ref 11.0–15.0)
Total Lymphocyte: 42.4 %
WBC: 6.7 10*3/uL (ref 3.8–10.8)
WBCMIX: 549 {cells}/uL (ref 200–950)

## 2016-12-23 LAB — INSULIN, RANDOM: Insulin: 12 u[IU]/mL (ref 2.0–19.6)

## 2016-12-23 LAB — LIPID PANEL
Cholesterol: 158 mg/dL (ref <200)
HDL: 66 mg/dL (ref >50)
LDL CHOLESTEROL (CALC): 79 mg/dL
NON-HDL CHOLESTEROL (CALC): 92 mg/dL (ref <130)
Total CHOL/HDL Ratio: 2.4 (calc) (ref <5.0)
Triglycerides: 53 mg/dL (ref <150)

## 2016-12-23 LAB — HEMOGLOBIN A1C
EAG (MMOL/L): 6.2 (calc)
Hgb A1c MFr Bld: 5.5 % of total Hgb (ref <5.7)
Mean Plasma Glucose: 111 (calc)

## 2017-01-14 ENCOUNTER — Encounter: Payer: Self-pay | Admitting: Family Medicine

## 2017-01-14 ENCOUNTER — Ambulatory Visit: Payer: 59 | Admitting: Family Medicine

## 2017-01-14 VITALS — BP 120/80 | HR 89 | Temp 97.6°F | Resp 14 | Wt 304.4 lb

## 2017-01-14 DIAGNOSIS — J029 Acute pharyngitis, unspecified: Secondary | ICD-10-CM | POA: Diagnosis not present

## 2017-01-14 DIAGNOSIS — J3489 Other specified disorders of nose and nasal sinuses: Secondary | ICD-10-CM | POA: Diagnosis not present

## 2017-01-14 LAB — POCT RAPID STREP A (OFFICE): RAPID STREP A SCREEN: NEGATIVE

## 2017-01-14 NOTE — Patient Instructions (Addendum)
Use flonase daily.  Take Allergy Medication (Allegra, Claritin, Zyrtec, or Xyzal) daily. Drink plenty of fluids and get plenty of rest.  Cool Mist Vaporizer A cool mist vaporizer is a device that releases a cool mist into the air. If you have a cough or a cold, using a vaporizer may help relieve your symptoms. The mist adds moisture to the air, which may help thin your mucus and make it less sticky. When your mucus is thin and less sticky, it easier for you to breathe and to cough up secretions. Do not use a vaporizer if you are allergic to mold. Follow these instructions at home:  Follow the instructions that come with the vaporizer.  Do not use anything other than distilled water in the vaporizer.  Do not run the vaporizer all of the time. Doing that can cause mold or bacteria to grow in the vaporizer.  Clean the vaporizer after each time that you use it.  Clean and dry the vaporizer well before storing it.  Stop using the vaporizer if your breathing symptoms get worse. This information is not intended to replace advice given to you by your health care provider. Make sure you discuss any questions you have with your health care provider. Document Released: 10/03/2003 Document Revised: 07/26/2015 Document Reviewed: 04/06/2015 Elsevier Interactive Patient Education  2018 [employer].   Upper Respiratory Infection, Adult Most upper respiratory infections (URIs) are caused by a virus. A URI affects the nose, throat, and upper air passages. The most common type of URI is often called "the common cold." Follow these instructions at home:  Take medicines only as told by your doctor.  Gargle warm saltwater or take cough drops to comfort your throat as told by your doctor.  Use a warm mist humidifier or inhale steam from a shower to increase air moisture. This may make it easier to breathe.  Drink enough fluid to keep your pee (urine) clear or pale yellow.  Eat soups and other clear  broths.  Have a healthy diet.  Rest as needed.  Go back to work when your fever is gone or your doctor says it is okay. ? You may need to stay home longer to avoid giving your URI to others. ? You can also wear a face mask and wash your hands often to prevent spread of the virus.  Use your inhaler more if you have asthma.  Do not use any tobacco products, including cigarettes, chewing tobacco, or electronic cigarettes. If you need help quitting, ask your doctor. Contact a doctor if:  You are getting worse, not better.  Your symptoms are not helped by medicine.  You have chills.  You are getting more short of breath.  You have brown or red mucus.  You have yellow or brown discharge from your nose.  You have pain in your face, especially when you bend forward.  You have a fever.  You have puffy (swollen) neck glands.  You have pain while swallowing.  You have white areas in the back of your throat. Get help right away if:  You have very bad or constant: ? Headache. ? Ear pain. ? Pain in your forehead, behind your eyes, and over your cheekbones (sinus pain). ? Chest pain.  You have long-lasting (chronic) lung disease and any of the following: ? Wheezing. ? Long-lasting cough. ? Coughing up blood. ? A change in your usual mucus.  You have a stiff neck.  You have changes in your: ? Vision. ?  Hearing. ? Thinking. ? Mood. This information is not intended to replace advice given to you by your health care provider. Make sure you discuss any questions you have with your health care provider. Document Released: 06/24/2007 Document Revised: 09/08/2015 Document Reviewed: 04/12/2013 Elsevier Interactive Patient Education  2018 [employer].

## 2017-01-14 NOTE — Progress Notes (Signed)
Name: Michelle Ware   MRN: [id number]    DOB: 01-03-1981   Date:01/14/2017       Progress Note  Subjective  Chief Complaint  Chief Complaint  Patient presents with  . Sore Throat  . Fatigue    HPI  Patient presents with concern for strep throat and mono - she reports sore throat, rhinorrhea, and fatigue x1 day.  She has been around a family member with strep and briefly around 2 family members with mono.  She has a 66mo old baby at home and wants to be sure she does not have either of these.  Patient Active Problem List   Diagnosis Date Noted  . S/P cesarean section: Indication: hx. of myomectomy and CHTN  09/28/2016  . Tenosynovitis of wrist 05/21/2016  . Heart palpitations 04/28/2016  . Morbid obesity with BMI of 40.0-44.9, adult ([place]) 03/31/2016  . Thyroid nodule 07/07/2015  . H/O myomectomy 03/11/2015  . Essential hypertension 11/28/2014  . Obesity, Class III, BMI 40-49.9 (morbid obesity) ([place]) 11/28/2014  . Decreased thyroid stimulating hormone (TSH) level 07/24/2014  . Hypertrichosis 07/24/2014  . Vitamin D deficiency 07/24/2014  . Thyromegaly 07/24/2014  . PCOS (polycystic ovarian syndrome) 07/24/2014  . History of abnormal cervical Pap smear 11/24/2011  . Genital herpes 11/29/2008   Social History   Tobacco Use  . Smoking status: Never Smoker  . Smokeless tobacco: Never Used  Substance Use Topics  . Alcohol use: Yes    Comment: occasionally, about once a month    Current Outpatient Medications:  .  hydrochlorothiazide (HYDRODIURIL) 12.5 MG tablet, Take 1 tablet (12.5 mg total) by mouth daily., Disp: 90 tablet, Rfl: 1 .  ibuprofen (ADVIL,MOTRIN) 600 MG tablet, Take 1 tablet (600 mg total) by mouth every 6 (six) hours., Disp: 30 tablet, Rfl: 0 .  Semaglutide (OZEMPIC) 0.25 or 0.5 MG/DOSE SOPN, Inject 0.5 mg into the skin once a week., Disp: 2 pen, Rfl: 2 .  valACYclovir (VALTREX) 500 MG tablet, Take 500 mg 2 (two) times daily by mouth., Disp: , Rfl: 1 .  Vitamin D,  Ergocalciferol, 2000 units CAPS, Take 2,000 Units by mouth daily., Disp: , Rfl:   Allergies  Allergen Reactions  . Hydrocodone Itching    ROS  Constitutional: Negative for fever/chills or weight change.  Respiratory: Negative for cough and shortness of breath.   Cardiovascular: Negative for chest pain or palpitations.  Gastrointestinal: Negative for abdominal pain, no bowel changes, no NVD.  Musculoskeletal: Negative for gait problem or joint swelling.  Skin: Negative for rash.  Neurological: Negative for dizziness/lightheadedness.  Endorses headache.  No other specific complaints in a complete review of systems (except as listed in HPI above).  Objective  Vitals:   01/14/17 1334  BP: 120/80  Pulse: 89  Resp: 14  Temp: 97.6 F (36.4 C)  TempSrc: Oral  SpO2: 98%  Weight: (!) 304 lb 6.4 oz (138.1 kg)   Body mass index is 44.95 kg/m.  Nursing Note and Vital Signs reviewed.  Physical Exam Constitutional: Patient appears well-developed and well-nourished. Obese.  No distress.  HEENT: head atraumatic, normocephalic, pupils equal and reactive to light, EOM's intact, TM's without erythema or bulging, no maxillary or frontal sinus pain on palpation, neck supple with very mild RIGHT sided lymphadenopathy, oropharynx mildly erythematous and moist without exudate. Turbinates inflamed bilaterally. Cardiovascular: Normal rate, regular rhythm, S1/S2 present.  No murmur or rub heard. No BLE edema. Pulmonary/Chest: Effort normal and breath sounds clear. No respiratory distress or retractions. Abdomen:  Soft, non-tender, no HSM or masses. Psychiatric: Patient has a normal mood and affect. behavior is normal. Judgment and thought content normal.  Recent Results (from the past 2160 hour(s))  CBC with Differential/Platelet     Status: Abnormal   Collection Time: 12/22/16  9:53 AM  Result Value Ref Range   WBC 6.7 3.8 - 10.8 Thousand/uL   RBC 4.75 3.80 - 5.10 Million/uL   Hemoglobin 12.0  11.7 - 15.5 g/dL   HCT 37.1 35.0 - 45.0 %   MCV 78.1 (L) 80.0 - 100.0 fL   MCH 25.3 (L) 27.0 - 33.0 pg   MCHC 32.3 32.0 - 36.0 g/dL   RDW 13.1 11.0 - 15.0 %   Platelets 276 140 - 400 Thousand/uL   MPV 10.1 7.5 - 12.5 fL   Neutro Abs 3,203 1,500 - 7,800 cells/uL   Lymphs Abs 2,841 850 - 3,900 cells/uL   WBC mixed population 549 200 - 950 cells/uL   Eosinophils Absolute 67 15 - 500 cells/uL   Basophils Absolute 40 0 - 200 cells/uL   Neutrophils Relative % 47.8 %   Total Lymphocyte 42.4 %   Monocytes Relative 8.2 %   Eosinophils Relative 1.0 %   Basophils Relative 0.6 %  COMPLETE METABOLIC PANEL WITH GFR     Status: None   Collection Time: 12/22/16  9:53 AM  Result Value Ref Range   Glucose, Bld 83 65 - 99 mg/dL    Comment: .            Fasting reference interval .    BUN 15 7 - 25 mg/dL   Creat 0.95 0.50 - 1.10 mg/dL   GFR, Est Non African American 76 > OR = 60 mL/min/1.68m2   GFR, Est African American 88 > OR = 60 mL/min/1.45m2   BUN/Creatinine Ratio NOT APPLICABLE 6 - 22 (calc)   Sodium 137 135 - 146 mmol/L   Potassium 4.2 3.5 - 5.3 mmol/L   Chloride 104 98 - 110 mmol/L   CO2 28 20 - 32 mmol/L   Calcium 9.6 8.6 - 10.2 mg/dL   Total Protein 7.3 6.1 - 8.1 g/dL   Albumin 4.3 3.6 - 5.1 g/dL   Globulin 3.0 1.9 - 3.7 g/dL (calc)   AG Ratio 1.4 1.0 - 2.5 (calc)   Total Bilirubin 0.3 0.2 - 1.2 mg/dL   Alkaline phosphatase (APISO) 57 33 - 115 U/L   AST 15 10 - 30 U/L   ALT 11 6 - 29 U/L  Hemoglobin A1c     Status: None   Collection Time: 12/22/16  9:53 AM  Result Value Ref Range   Hgb A1c MFr Bld 5.5 <5.7 % of total Hgb    Comment: For the purpose of screening for the presence of diabetes: . <5.7%       Consistent with the absence of diabetes 5.7-6.4%    Consistent with increased risk for diabetes             (prediabetes) > or =6.5%  Consistent with diabetes . This assay result is consistent with a decreased risk of diabetes. . Currently, no consensus exists  regarding use of hemoglobin A1c for diagnosis of diabetes in children. . According to American Diabetes Association (ADA) guidelines, hemoglobin A1c <7.0% represents optimal control in non-pregnant diabetic patients. Different metrics may apply to specific patient populations.  Standards of Medical Care in Diabetes(ADA). .    Mean Plasma Glucose 111 (calc)   eAG (mmol/L) 6.2 (calc)  Lipid  panel     Status: None   Collection Time: 12/22/16  9:53 AM  Result Value Ref Range   Cholesterol 158 <200 mg/dL   HDL 66 >50 mg/dL   Triglycerides 53 <150 mg/dL   LDL Cholesterol (Calc) 79 mg/dL (calc)    Comment: Reference range: <100 . Desirable range <100 mg/dL for primary prevention;   <70 mg/dL for patients with CHD or diabetic patients  with > or = 2 CHD risk factors. Marland Kitchen LDL-C is now calculated using the Martin-Hopkins  calculation, which is a validated novel method providing  better accuracy than the Friedewald equation in the  estimation of LDL-C.  Cresenciano Genre et al. Annamaria Helling. 7425;956(38): 2061-2068  (http://education.[facility].com/faq/FAQ164)    Total CHOL/HDL Ratio 2.4 <5.0 (calc)   Non-HDL Cholesterol (Calc) 92 <130 mg/dL (calc)    Comment: For patients with diabetes plus 1 major ASCVD risk  factor, treating to a non-HDL-C goal of <100 mg/dL  (LDL-C of <70 mg/dL) is considered a therapeutic  option.   Insulin, random     Status: None   Collection Time: 12/22/16  9:53 AM  Result Value Ref Range   Insulin 12.0 2.0 - 19.6 uIU/mL    Comment: This insulin assay shows strong cross-reactivity for some insulin analogs (lispro, aspart, and glargine) and much lower cross-reactivity with others (detemir, glulisine).   Iron, TIBC and Ferritin Panel     Status: None   Collection Time: 12/22/16  9:53 AM  Result Value Ref Range   Iron 72 40 - 190 mcg/dL   TIBC 322 250 - 450 mcg/dL (calc)   %SAT 22 11 - 50 % (calc)   Ferritin 97 10 - 154 ng/mL  POCT rapid strep A     Status:  Normal   Collection Time: 01/14/17  2:19 PM  Result Value Ref Range   Rapid Strep A Screen Negative Negative     Assessment & Plan  1. Sore throat - POCT rapid strep A 2. Rhinorrhea Flonase OTC OTC Antihistamine  - Collaborative discussion regarding mono transmission and low likelihood of mono at this point based on mild symptoms, unremarkable examination, and short course of illness.  Pt is comfortable not having laboratory testing today, and returning if not improving. Recommend excellent handwashing and cleaning the home to help prevent spread of illness to her son. Advised likely a viral illness at this time.  Pt declines Rx for flonase or Antihistamine, will take OTC Flonase and antihistamine that she has at home.  -Red flags and when to present for emergency care or RTC including fever >101.54F, chest pain, shortness of breath, new/worsening/un-resolving symptoms, reviewed with patient at time of visit. Follow up and care instructions discussed and provided in AVS.

## 2017-02-02 ENCOUNTER — Ambulatory Visit: Payer: 59 | Admitting: Family Medicine

## 2017-03-05 HISTORY — PX: TOTAL THYROIDECTOMY: SHX2547

## 2017-03-23 ENCOUNTER — Ambulatory Visit: Payer: Self-pay | Admitting: Family Medicine

## 2017-03-25 ENCOUNTER — Encounter: Payer: Self-pay | Admitting: Family Medicine

## 2017-03-25 ENCOUNTER — Ambulatory Visit: Payer: 59 | Admitting: Family Medicine

## 2017-03-25 VITALS — BP 138/84 | HR 83 | Temp 97.8°F | Resp 16 | Ht 69.0 in | Wt 308.9 lb

## 2017-03-25 DIAGNOSIS — J301 Allergic rhinitis due to pollen: Secondary | ICD-10-CM | POA: Diagnosis not present

## 2017-03-25 DIAGNOSIS — E8881 Metabolic syndrome: Secondary | ICD-10-CM

## 2017-03-25 DIAGNOSIS — Z9889 Other specified postprocedural states: Secondary | ICD-10-CM | POA: Diagnosis not present

## 2017-03-25 DIAGNOSIS — Z9009 Acquired absence of other part of head and neck: Secondary | ICD-10-CM

## 2017-03-25 DIAGNOSIS — E890 Postprocedural hypothyroidism: Secondary | ICD-10-CM | POA: Diagnosis not present

## 2017-03-25 MED ORDER — LEVOCETIRIZINE DIHYDROCHLORIDE 5 MG PO TABS: 5.0000 mg | ORAL_TABLET | Freq: Every evening | ORAL | 2 refills | Status: DC

## 2017-03-25 NOTE — Progress Notes (Signed)
Name: Michelle Ware   MRN: [id number]    DOB: 01/24/1980   Date:03/25/2017       Progress Note  Subjective  Chief Complaint  Chief Complaint  Patient presents with  . URI    Onset-1 week, patient had thyroidectomy and has been having a productive dark yellow phlegm since, sinus pain, congestion. Has tried Mucinex and Sudafed but nothing has helped her symptoms-her symptoms are getting worst.    HPI  Obesity:she has a long history of obesity, started at middle school. She lost weight on her senior year of HS - by eating lean cuisine meals, but gained it back a few years later. Highest weight was 310 lbs on Mar 05 2017.  Weight was stable over the past year, but gained 10 lbs since Dec. She states she has been drinking sweet tea again.  She has not been physically active. She never started Ozempic because she read about thyroid problems and would like to discuss it today  Metabolic syndrome/PCOS: she has hypertrichosis, oligomenorrhea,acanthosis nigricans, increase in abdominal girth and also HTN, hgb A1C has been normal and there is no history of DM in her family. No polyphagia, polydipsia or polyuria. We will start Ozempic  AR: she states right after thyroidectomy she developed a productive cough, that has improved, but now she has clear rhinorrhea. Also has post-nasal drainage, no pruritus. She also has some nasal congestion and mild right facial pain. No fever or chills. Appetite is normal   Thyroidectomy: Feb 15 th, 2019 - because of size of goiter, last TSH was low, dose adjusted by surgeon last week, needs to recheck in 6 weeks   Patient Active Problem List   Diagnosis Date Noted  . Post-surgical hypothyroidism 03/25/2017  . S/P cesarean section: Indication: hx. of myomectomy and CHTN  09/28/2016  . Tenosynovitis of wrist 05/21/2016  . Morbid obesity with BMI of 40.0-44.9, adult ([place]) 03/31/2016  . H/O myomectomy 03/11/2015  . Essential hypertension 11/28/2014  . Obesity, Class III,  BMI 40-49.9 (morbid obesity) ([place]) 11/28/2014  . Decreased thyroid stimulating hormone (TSH) level 07/24/2014  . Hypertrichosis 07/24/2014  . Vitamin D deficiency 07/24/2014  . PCOS (polycystic ovarian syndrome) 07/24/2014  . History of abnormal cervical Pap smear 11/24/2011  . Genital herpes 11/29/2008    Past Surgical History:  Procedure Laterality Date  . BIOPSY THYROID  07/2015   Dr. Gabriel Carina  . CESAREAN SECTION N/A 09/28/2016   Procedure: Primary CESAREAN SECTION;  Surgeon: Servando [place], MD;  Location: [facility];  Service: Obstetrics;  Laterality: N/A;  EDD: 10/19/16 Allergy: Hydrocodone-Acetaminophen  . LAPAROSCOPIC GELPORT ASSISTED MYOMECTOMY N/A 03/11/2015   Procedure: LAPAROSCOPIC  MYOMECTOMY--attempted;  Surgeon: Rubie Maid, MD;  Location: [facility];  Service: Gynecology;  Laterality: N/A;  . LAPAROTOMY N/A 03/11/2015   Procedure: LAPAROTOMY--MYOMECTOMY;  Surgeon: Rubie Maid, MD;  Location: [facility];  Service: Gynecology;  Laterality: N/A;  . MOUTH SURGERY  1996  . TOTAL THYROIDECTOMY Bilateral 03/05/2017   Dr. Maudie Mercury [facility]    Family History  Problem Relation Age of Onset  . Thyroid disease Mother   . Hypertension Mother   . Cancer Maternal Aunt   . Cancer Maternal Grandfather     Social History   Socioeconomic History  . Marital status: Single    Spouse name: Not on file  . Number of children: 1  . Years of education: Not on file  . Highest education level: Bachelor's degree (e.g., BA, AB, BS)  Social Needs  .  Financial resource strain: Not hard at all  . Food insecurity - worry: Never true  . Food insecurity - inability: Never true  . Transportation needs - medical: No  . Transportation needs - non-medical: No  Occupational History  . Occupation: Glass blower/designer    Comment: works for [employer] in [employer]  . Smoking status: Never Smoker  . Smokeless tobacco: Never Used  Substance and Sexual Activity  . Alcohol use: Yes     Comment: occasionally, about once a month  . Drug use: No  . Sexual activity: Not Currently    Partners: Male    Birth control/protection: Condom  Other Topics Concern  . 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     Current Outpatient Medications:  .  fluticasone (FLONASE) 50 MCG/ACT nasal spray, Place 2 sprays into both nostrils as needed., Disp: , Rfl:  .  hydrochlorothiazide (HYDRODIURIL) 12.5 MG tablet, Take 1 tablet (12.5 mg total) by mouth daily., Disp: 90 tablet, Rfl: 1 .  levothyroxine (SYNTHROID, LEVOTHROID) 175 MCG tablet, Take by mouth., Disp: , Rfl:  .  Semaglutide (OZEMPIC) 0.25 or 0.5 MG/DOSE SOPN, Inject 0.5 mg into the skin once a week., Disp: 2 pen, Rfl: 2 .  valACYclovir (VALTREX) 500 MG tablet, Take 500 mg 2 (two) times daily by mouth., Disp: , Rfl: 1 .  Vitamin D, Ergocalciferol, 2000 units CAPS, Take 2,000 Units by mouth daily., Disp: , Rfl:   Allergies  Allergen Reactions  . Hydrocodone Itching     ROS  Constitutional: Negative for fever or weight change.  Respiratory: Positive  for cough but no  shortness of breath.   Cardiovascular: Negative for chest pain or palpitations.  Gastrointestinal: Negative for abdominal pain, no bowel changes.  Musculoskeletal: Negative for gait problem or joint swelling.  Skin: Negative for rash.  Neurological: Negative for dizziness or headache.  No other specific complaints in a complete review of systems (except as listed in HPI above).  Objective  Vitals:   03/25/17 1007  BP: 138/84  Pulse: 83  Resp: 16  Temp: 97.8 F (36.6 C)  TempSrc: Oral  SpO2: 99%  Weight: (!) 308 lb 14.4 oz (140.1 kg)  Height: 5\' 9"  (1.753 m)    Body mass index is 45.62 kg/m.  Physical Exam  Constitutional: Patient appears well-developed and  well-nourished. Obese  No distress.  HEENT: head atraumatic, normocephalic, pupils equal and reactive to light, ears normal TM bilatearlly neck supple, throat within normal limits, clear rhinorrhea, boggy turbinates Cardiovascular: Normal rate, regular rhythm and normal heart sounds.  No murmur heard. No BLE edema. Pulmonary/Chest: Effort normal and breath sounds normal. No respiratory distress. Abdominal: Soft.  There is no tenderness. Psychiatric: Patient has a normal mood and affect. behavior is normal. Judgment and thought content normal. Skin: hypertrichosis on face , healing scar from thyroidectomy   Recent Results (from the past 2160 hour(s))  POCT rapid strep A     Status: Normal   Collection Time: 01/14/17  2:19 PM  Result Value Ref Range   Rapid Strep A Screen Negative Negative     PHQ2/9: Depression screen [facility] 2/9 03/25/2017 12/22/2016 02/25/2016 12/20/2015 05/23/2015  Decreased Interest 0 0 0 0 0  Down, Depressed, Hopeless 0 0 0 0  0  PHQ - 2 Score 0 0 0 0 0     Fall Risk: Fall Risk  03/25/2017 01/14/2017 12/22/2016 02/25/2016 12/20/2015  Falls in the past year? No No No No No     Functional Status Survey: Is the patient deaf or have difficulty hearing?: No Does the patient have difficulty seeing, even when wearing glasses/contacts?: No Does the patient have difficulty concentrating, remembering, or making decisions?: No Does the patient have difficulty walking or climbing stairs?: No Does the patient have difficulty dressing or bathing?: No Does the patient have difficulty doing errands alone such as visiting a doctor's office or shopping?: No    Assessment & Plan   1. Obesity, Class III, BMI 40-49.9 (morbid obesity) ([place])  She never started Ozempic was worried about side effects, discussed it with her today and we will start first dose in the office today.   2. History of thyroidectomy   03/05/2017 on synthroid 175 mcg , following up with me in May   3. Seasonal  allergic rhinitis due to pollen  Resume Flonase - levocetirizine (XYZAL) 5 MG tablet; Take 1 tablet (5 mg total) by mouth every evening.  Dispense: 30 tablet; Refill: 2  4. Metabolic syndrome  PCO's ,HTN, increase in abdominal girth. We will start ozempic   5. Post-surgical hypothyroidism  Continue thyroid medication, last TSH was 0.354, last week she was on 200 mcg and is now on 175 mcg

## 2017-05-06 ENCOUNTER — Telehealth: Payer: Self-pay | Admitting: Emergency Medicine

## 2017-05-10 ENCOUNTER — Ambulatory Visit: Payer: 59 | Admitting: Family Medicine

## 2017-07-14 ENCOUNTER — Ambulatory Visit: Payer: 59 | Admitting: Family Medicine

## 2017-07-14 ENCOUNTER — Encounter

## 2017-08-03 ENCOUNTER — Encounter: Payer: Self-pay | Admitting: Family Medicine

## 2017-08-05 ENCOUNTER — Encounter: Payer: Self-pay | Admitting: Family Medicine

## 2017-08-05 ENCOUNTER — Ambulatory Visit: Payer: 59 | Admitting: Family Medicine

## 2017-08-05 VITALS — BP 130/80 | HR 91 | Temp 98.3°F | Resp 16 | Ht 69.0 in | Wt 318.6 lb

## 2017-08-05 DIAGNOSIS — E890 Postprocedural hypothyroidism: Secondary | ICD-10-CM | POA: Insufficient documentation

## 2017-08-05 DIAGNOSIS — Z23 Encounter for immunization: Secondary | ICD-10-CM

## 2017-08-05 DIAGNOSIS — S91311A Laceration without foreign body, right foot, initial encounter: Secondary | ICD-10-CM | POA: Diagnosis not present

## 2017-08-05 DIAGNOSIS — Z9889 Other specified postprocedural states: Secondary | ICD-10-CM

## 2017-08-05 DIAGNOSIS — Z9009 Acquired absence of other part of head and neck: Secondary | ICD-10-CM | POA: Insufficient documentation

## 2017-08-05 DIAGNOSIS — A6000 Herpesviral infection of urogenital system, unspecified: Secondary | ICD-10-CM | POA: Diagnosis not present

## 2017-08-05 MED ORDER — VALACYCLOVIR HCL 500 MG PO TABS: 500.0000 mg | ORAL_TABLET | Freq: Two times a day (BID) | ORAL | 1 refills | Status: DC

## 2017-08-05 NOTE — Progress Notes (Signed)
Name: Michelle Ware   MRN: [id number]    DOB: Mar 15, 1980   Date:08/05/2017       Progress Note  Subjective  Chief Complaint  Chief Complaint  Patient presents with  . Hypothyroidism  . Laceration    on right foot  . Hemorrhoids    HPI  Laceration to the RIGHT FOOT: she stepped on a nail at a festival 07/31/2017.  She reports some bleeding at initial injury but it has been well controlled since then. Injury is to the medial heel.   Thyroidectomy: February 2019; she was told to have her levels to rechecked today.  Taking 147mcg Synthroid daily, rarely misses a dose.  Endorses weight gain, fatigue, and some dizziness, endorses brittle hair/increased shedding of hair, and intermittent constipation.  Denies skin changes.  We will check TSH today as she is discharged from her surgeon's office and we are now managing her thyroid medication.  Possible Hemorrhoid - First noticed some rectal itching last week.  She has been constipated for about a week, but it has improved in recent days. The area is now sore; she did have a couple of episodes of bright red blood on the toilet paper last week, but denies blood in stool or dark and tarry stools.   Patient Active Problem List   Diagnosis Date Noted  . Post-surgical hypothyroidism 03/25/2017  . S/P cesarean section: Indication: hx. of myomectomy and CHTN  09/28/2016  . Tenosynovitis of wrist 05/21/2016  . Morbid obesity with BMI of 40.0-44.9, adult ([place]) 03/31/2016  . H/O myomectomy 03/11/2015  . Essential hypertension 11/28/2014  . Obesity, Class III, BMI 40-49.9 (morbid obesity) ([place]) 11/28/2014  . Decreased thyroid stimulating hormone (TSH) level 07/24/2014  . Hypertrichosis 07/24/2014  . Vitamin D deficiency 07/24/2014  . PCOS (polycystic ovarian syndrome) 07/24/2014  . History of abnormal cervical Pap smear 11/24/2011  . Genital herpes 11/29/2008    Social History   Tobacco Use  . Smoking status: Never Smoker  . Smokeless tobacco:  Never Used  Substance Use Topics  . Alcohol use: Yes    Comment: occasionally, about once a month     Current Outpatient Medications:  .  hydrochlorothiazide (HYDRODIURIL) 12.5 MG tablet, Take 1 tablet (12.5 mg total) by mouth daily., Disp: 90 tablet, Rfl: 1 .  levothyroxine (SYNTHROID, LEVOTHROID) 175 MCG tablet, Take by mouth., Disp: , Rfl:  .  Vitamin D, Ergocalciferol, 2000 units CAPS, Take 2,000 Units by mouth daily., Disp: , Rfl:  .  fluticasone (FLONASE) 50 MCG/ACT nasal spray, Place 2 sprays into both nostrils as needed., Disp: , Rfl:  .  levocetirizine (XYZAL) 5 MG tablet, Take 1 tablet (5 mg total) by mouth every evening. (Patient not taking: Reported on 08/05/2017), Disp: 30 tablet, Rfl: 2 .  Semaglutide (OZEMPIC) 0.25 or 0.5 MG/DOSE SOPN, Inject 0.5 mg into the skin once a week. (Patient not taking: Reported on 08/05/2017), Disp: 2 pen, Rfl: 2 .  valACYclovir (VALTREX) 500 MG tablet, Take 500 mg 2 (two) times daily by mouth., Disp: , Rfl: 1  Allergies  Allergen Reactions  . Hydrocodone Itching    ROS  Constitutional: Negative for fever; positive for weight change and fatigue.  Respiratory: Negative for cough and shortness of breath.   Cardiovascular: Negative for chest pain or palpitations.  Gastrointestinal: Negative for abdominal pain, no bowel changes.  Musculoskeletal: Negative for gait problem or joint swelling.  Skin: Negative for rash.  Neurological: Negative for dizziness or headache.  No other specific complaints  in a complete review of systems (except as listed in HPI above).   Objective  Vitals:   08/05/17 1445  BP: 130/80  Pulse: 91  Resp: 16  Temp: 98.3 F (36.8 C)  TempSrc: Oral  SpO2: 97%  Weight: (!) 318 lb 9.6 oz (144.5 kg)  Height: 5\' 9"  (1.753 m)   Body mass index is 47.05 kg/m.  Nursing Note and Vital Signs reviewed.  Physical Exam  Constitutional: She is oriented to person, place, and time. She appears well-developed and  well-nourished. No distress.  HENT:  Head: Normocephalic and atraumatic.  Right Ear: External ear normal.  Left Ear: External ear normal.  Nose: Nose normal.  Mouth/Throat: Oropharynx is clear and moist. No oropharyngeal exudate.  Eyes: Pupils are equal, round, and reactive to light. Conjunctivae and EOM are normal.  Neck: Normal range of motion. Neck supple. No JVD present. No thyromegaly present.  Cardiovascular: Normal rate, regular rhythm and normal heart sounds.  Pulmonary/Chest: Effort normal and breath sounds normal. She has no wheezes. She has no rales. She exhibits no tenderness.  Abdominal: Soft. Bowel sounds are normal.  Genitourinary:     Musculoskeletal: Normal range of motion. She exhibits no edema or tenderness.  Lymphadenopathy:    She has no cervical adenopathy.  Neurological: She is alert and oriented to person, place, and time. No cranial nerve deficit.  Skin: Skin is warm and dry. Capillary refill takes less than 2 seconds. Abrasion noted. No rash noted.     Psychiatric: She has a normal mood and affect. Her behavior is normal. Judgment and thought content normal.  Nursing note and vitals reviewed.    No results found for this or any previous visit (from the past 72 hour(s)).  Assessment & Plan  1. History of thyroidectomy - We will check level today.  Follow up in 6 weeks with PCP to ensure repeat testing. - TSH  2. Foot laceration, right, initial encounter - Tdap vaccine greater than or equal to 7yo IM  3. Genital herpes simplex, unspecified site - Advised lesion appears herpetic. She has history genital herpes and needs Valtrex refill. We will refill today. - valACYclovir (VALTREX) 500 MG tablet; Take 1 tablet (500 mg total) by mouth 2 (two) times daily.  Dispense: 30 tablet; Refill: 1  4. Need for Tdap vaccination - Last TDAP was 2013, recommend booster today due to injury via nail. - Tdap vaccine greater than or equal to 7yo IM

## 2017-08-06 LAB — TSH: TSH: 5.72 mIU/L — ABNORMAL HIGH

## 2017-09-15 ENCOUNTER — Encounter: Payer: Self-pay | Admitting: Family Medicine

## 2017-09-15 ENCOUNTER — Ambulatory Visit: Payer: 59 | Admitting: Family Medicine

## 2017-09-15 VITALS — BP 138/74 | HR 88 | Temp 97.9°F | Resp 18 | Ht 69.0 in | Wt 319.2 lb

## 2017-09-15 DIAGNOSIS — E890 Postprocedural hypothyroidism: Secondary | ICD-10-CM

## 2017-09-15 DIAGNOSIS — E559 Vitamin D deficiency, unspecified: Secondary | ICD-10-CM

## 2017-09-15 DIAGNOSIS — I10 Essential (primary) hypertension: Secondary | ICD-10-CM

## 2017-09-15 DIAGNOSIS — E8881 Metabolic syndrome: Secondary | ICD-10-CM

## 2017-09-15 DIAGNOSIS — Z9889 Other specified postprocedural states: Secondary | ICD-10-CM | POA: Diagnosis not present

## 2017-09-15 DIAGNOSIS — A6000 Herpesviral infection of urogenital system, unspecified: Secondary | ICD-10-CM

## 2017-09-15 DIAGNOSIS — Z9009 Acquired absence of other part of head and neck: Secondary | ICD-10-CM

## 2017-09-15 DIAGNOSIS — J301 Allergic rhinitis due to pollen: Secondary | ICD-10-CM

## 2017-09-15 MED ORDER — HYDROCHLOROTHIAZIDE 12.5 MG PO TABS: 12.5000 mg | ORAL_TABLET | Freq: Every day | ORAL | 1 refills | Status: DC

## 2017-09-15 NOTE — Progress Notes (Signed)
Name: Michelle Ware   MRN: [id number]    DOB: 19-Feb-1980   Date:09/15/2017       Progress Note  Subjective  Chief Complaint  Chief Complaint  Patient presents with  . Medication Refill  . Follow-up    6 week F/U-Changed dosage  . Hypothyroidism    Is steady gaining weight, hair loss and constipation.    HPI  Obesity:she has a long history of obesity, started at middle school. She lost weight on her senior year of HS - by eating lean cuisine meals, but gained it back a few years later. Highest weight is today at 319 lbs. She had thyroidectomy Feb 2019, had a child 09/2016. She is not drinking sweet beverages on a regular basis. She has been eating fast food but is trying to meal prep again   Metabolic syndrome/PCOS: she has hypertrichosis, oligomenorrhea,acanthosis nigricans, increase in abdominal girth and also HTN, hgb A1C has been normal and there is no history of DM in her family. No polyphagia, polydipsia or polyuria. She used Ozempic before pregnancy but not since she had her baby, she states keeps forgetting to use it, advised to try using on the weekend   AR: doing well at this time, no rhinorrhea or congestion and has not been taking medications. Only takes it prn   Thyroidectomy: Feb 15 th, 2019 - because of size of goiter, last TSH was high, she has been taking one pill daily and one and half twice a week but she just got the message a couple of weeks ago and we will hold off on rechecking level until next visit. She has gained and noticed constipation. She is also feeling more tired than usual   Patient Active Problem List   Diagnosis Date Noted  . History of thyroidectomy 08/05/2017  . Post-surgical hypothyroidism 03/25/2017  . S/P cesarean section: Indication: hx. of myomectomy and CHTN  09/28/2016  . Tenosynovitis of wrist 05/21/2016  . Morbid obesity with BMI of 40.0-44.9, adult ([place]) 03/31/2016  . H/O myomectomy 03/11/2015  . Essential hypertension 11/28/2014  .  Obesity, Class III, BMI 40-49.9 (morbid obesity) ([place]) 11/28/2014  . Decreased thyroid stimulating hormone (TSH) level 07/24/2014  . Hypertrichosis 07/24/2014  . Vitamin D deficiency 07/24/2014  . PCOS (polycystic ovarian syndrome) 07/24/2014  . History of abnormal cervical Pap smear 11/24/2011  . Genital herpes 11/29/2008    Past Surgical History:  Procedure Laterality Date  . BIOPSY THYROID  07/2015   Dr. Gabriel Carina  . CESAREAN SECTION N/A 09/28/2016   Procedure: Primary CESAREAN SECTION;  Surgeon: Servando Salina, MD;  Location: [place];  Service: Obstetrics;  Laterality: N/A;  EDD: 10/19/16 Allergy: Hydrocodone-Acetaminophen  . LAPAROSCOPIC GELPORT ASSISTED MYOMECTOMY N/A 03/11/2015   Procedure: LAPAROSCOPIC  MYOMECTOMY--attempted;  Surgeon: Rubie Maid, MD;  Location: [facility];  Service: Gynecology;  Laterality: N/A;  . LAPAROTOMY N/A 03/11/2015   Procedure: LAPAROTOMY--MYOMECTOMY;  Surgeon: Rubie Maid, MD;  Location: [facility];  Service: Gynecology;  Laterality: N/A;  . MOUTH SURGERY  1996  . TOTAL THYROIDECTOMY Bilateral 03/05/2017   Dr. Maudie Mercury [facility]    Family History  Problem Relation Age of Onset  . Thyroid disease Mother   . Hypertension Mother   . Cancer Maternal Aunt   . Cancer Maternal Grandfather     Social History   Socioeconomic History  . Marital status: Single    Spouse name: Not on file  . Number of children: 1  . Years of education: Not on file  .  Highest education level: Bachelor's degree (e.g., BA, AB, BS)  Occupational History  . Occupation: Glass blower/designer    Comment: works for [employer] in [employer]  . Financial resource strain: Not hard at all  . Food insecurity:    Worry: Never true    Inability: Never true  . Transportation needs:    Medical: No    Non-medical: No  Tobacco Use  . Smoking status: Never Smoker  . Smokeless tobacco: Never Used  Substance and Sexual Activity  . Alcohol use: Yes    Comment:  occasionally, about once a month  . Drug use: No  . Sexual activity: Not Currently    Partners: Male    Birth control/protection: Condom  Lifestyle  . Physical activity:    Days per week: 0 days    Minutes per session: 0 min  . Stress: Not at all  Relationships  . Social connections:    Talks on phone: More than three times a week    Gets together: Once a week    Attends religious service: More than 4 times per year    Active member of club or organization: Yes    Attends meetings of clubs or organizations: More than 4 times per year    Relationship status: Never married  . Intimate partner violence:    Fear of current or ex partner: No    Emotionally abused: No    Physically abused: No    Forced sexual activity: No  Other Topics Concern  . 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     Current Outpatient Medications:  .  fluticasone (FLONASE) 50 MCG/ACT nasal spray, Place 2 sprays into both nostrils as needed., Disp: , Rfl:  .  hydrochlorothiazide (HYDRODIURIL) 12.5 MG tablet, Take 1 tablet (12.5 mg total) by mouth daily., Disp: 90 tablet, Rfl: 1 .  levothyroxine (SYNTHROID, LEVOTHROID) 175 MCG tablet, Take by mouth., Disp: , Rfl:  .  valACYclovir (VALTREX) 500 MG tablet, Take 1 tablet (500 mg total) by mouth 2 (two) times daily., Disp: 30 tablet, Rfl: 1 .  Vitamin D, Ergocalciferol, 2000 units CAPS, Take 2,000 Units by mouth daily., Disp: , Rfl:  .  levocetirizine (XYZAL) 5 MG tablet, Take 1 tablet (5 mg total) by mouth every evening. (Patient not taking: Reported on 08/05/2017), Disp: 30 tablet, Rfl: 2 .  Semaglutide (OZEMPIC) 0.25 or 0.5 MG/DOSE SOPN, Inject 0.5 mg into the skin once a week. (Patient not taking: Reported on 08/05/2017), Disp: 2 pen, Rfl: 2  Allergies  Allergen Reactions  .  Hydrocodone Itching     ROS  Constitutional: Negative for fever, positive for  weight change.  Respiratory: Negative for cough and shortness of breath.   Cardiovascular: Negative for chest pain or palpitations.  Gastrointestinal: Negative for abdominal pain, no bowel changes.  Musculoskeletal: Negative for gait problem or joint swelling.  Skin: Negative for rash.  Neurological: Negative for dizziness or headache.  No other specific complaints in a complete review of systems (except as listed in HPI above).  Objective  Vitals:   09/15/17 1527  BP: 138/74  Pulse: 88  Resp: 18  Temp: 97.9 F (36.6 C)  TempSrc: Oral  SpO2: 98%  Weight: (!) 319 lb 3.2 oz (144.8 kg)  Height: 5\' 9"  (1.753 m)    Body mass index is 47.14 kg/m.  Physical Exam  Constitutional: Patient appears well-developed and well-nourished. Obese  No distress.  HEENT: head atraumatic, normocephalic, pupils equal and reactive to light,  neck supple, throat within normal limits Cardiovascular: Normal rate, regular rhythm and normal heart sounds.  No murmur heard. No BLE edema. Pulmonary/Chest: Effort normal and breath sounds normal. No respiratory distress. Abdominal: Soft.  There is no tenderness. Skin: hypertrichosis. Psychiatric: Patient has a normal mood and affect. behavior is normal. Judgment and thought content normal.  Recent Results (from the past 2160 hour(s))  TSH     Status: Abnormal   Collection Time: 08/05/17  3:35 PM  Result Value Ref Range   TSH 5.72 (H) mIU/L    Comment:           Reference Range .           > or = 20 Years  0.40-4.50 .                Pregnancy Ranges           First trimester    0.26-2.66           Second trimester   0.55-2.73           Third trimester    0.43-2.91      PHQ2/9: Depression screen [facility] 2/9 08/05/2017 03/25/2017 12/22/2016 02/25/2016 12/20/2015  Decreased Interest 0 0 0 0 0  Down, Depressed, Hopeless 0 0 0 0 0  PHQ - 2 Score 0 0 0 0 0  Altered sleeping 0  - - - -  Tired, decreased energy 0 - - - -  Change in appetite 0 - - - -  Feeling bad or failure about yourself  0 - - - -  Trouble concentrating 0 - - - -  Moving slowly or fidgety/restless 0 - - - -  Suicidal thoughts 0 - - - -  PHQ-9 Score 0 - - - -  Difficult doing work/chores Not difficult at all - - - -     Fall Risk: Fall Risk  08/05/2017 03/25/2017 01/14/2017 12/22/2016 02/25/2016  Falls in the past year? No No No No No     Functional Status Survey: Is the patient deaf or have difficulty hearing?: No Does the patient have difficulty seeing, even when wearing glasses/contacts?: No Does the patient have difficulty concentrating, remembering, or making decisions?: No Does the patient have difficulty walking or climbing stairs?: No Does the patient have difficulty dressing or bathing?: No Does the patient have difficulty doing errands alone such as visiting a doctor's office or shopping?: No    Assessment & Plan  1. Essential hypertension  - hydrochlorothiazide (HYDRODIURIL) 12.5 MG tablet; Take 1 tablet (12.5 mg total) by mouth daily.  Dispense: 90 tablet; Refill: 1  2. History of thyroidectomy  Taking medication 175 mcg daily and extra half twice a week , just changed dose two weeks ago we will recheck on her CPE in 6 weeks   3. Genital herpes simplex, unspecified site  stable  4. Obesity, Class III, BMI 40-49.9 (morbid obesity) ([place])  She continues to gain weight   5. Seasonal allergic rhinitis due to pollen  stable  6. Metabolic syndrome  She is currently not taking Ozempic because she forgets to take it, she states Ozempic helps curb her appetite. She states she will try again on Sundays   7. Vitamin D deficiency

## 2017-10-27 ENCOUNTER — Encounter: Payer: Self-pay | Admitting: Family Medicine

## 2017-10-27 ENCOUNTER — Ambulatory Visit: Payer: 59 | Admitting: Family Medicine

## 2017-10-27 VITALS — BP 138/80 | HR 84 | Temp 98.1°F | Resp 16 | Ht 69.0 in | Wt 317.2 lb

## 2017-10-27 DIAGNOSIS — Z9009 Acquired absence of other part of head and neck: Secondary | ICD-10-CM

## 2017-10-27 DIAGNOSIS — Z23 Encounter for immunization: Secondary | ICD-10-CM

## 2017-10-27 DIAGNOSIS — R1013 Epigastric pain: Secondary | ICD-10-CM

## 2017-10-27 DIAGNOSIS — E890 Postprocedural hypothyroidism: Secondary | ICD-10-CM

## 2017-10-27 LAB — TSH: TSH: 5.97 mIU/L — ABNORMAL HIGH

## 2017-10-27 MED ORDER — LEVOTHYROXINE SODIUM 175 MCG PO TABS: 175.0000 ug | ORAL_TABLET | Freq: Every day | ORAL | 0 refills | Status: DC

## 2017-10-27 NOTE — Patient Instructions (Signed)

## 2017-10-27 NOTE — Progress Notes (Signed)
Name: Michelle Ware   MRN: [id number]    DOB: [birth year]   Date:10/27/2017       Progress Note  Subjective  Chief Complaint  Chief Complaint  Patient presents with  . Follow-up    Will have chest tightness in the afternoons and after she burps it goes away.  . Hypothyroidism    Here to recheck TSH level since the last one was too high and Raelyn Ensign changed dosage-    HPI    Thyroidectomy: Feb 15 th, 2019 - because of size of goiter, last TSH was high, she was taking one pill daily 175 mcg  and one and half twice a week however she got a new rx that said once a day, therefore over the past 2 weeks she is only taking 175 mcg daily. She has been feeling more tired than usual. Weight is down 2 lbs.   Dyspepsia: she sates usually at work after her break, she states has to eat fast and feels bloated, once had regurgitation, no dysphagia, change in bowel movements. No heartburn or abdominal pain but has intermittent chest tightness that resolves when she burps.   Patient Active Problem List   Diagnosis Date Noted  . History of thyroidectomy 08/05/2017  . Post-surgical hypothyroidism 03/25/2017  . S/P cesarean section: Indication: hx. of myomectomy and CHTN  09/28/2016  . Tenosynovitis of wrist 05/21/2016  . Morbid obesity with BMI of 40.0-44.9, adult ([place]) 03/31/2016  . H/O myomectomy 03/11/2015  . Essential hypertension 11/28/2014  . Obesity, Class III, BMI 40-49.9 (morbid obesity) ([place]) 11/28/2014  . Decreased thyroid stimulating hormone (TSH) level 07/24/2014  . Hypertrichosis 07/24/2014  . Vitamin D deficiency 07/24/2014  . PCOS (polycystic ovarian syndrome) 07/24/2014  . History of abnormal cervical Pap smear 11/24/2011  . Genital herpes 11/29/2008    Past Surgical History:  Procedure Laterality Date  . BIOPSY THYROID  07/2015   Dr. Gabriel Carina  . CESAREAN SECTION N/A 09/28/2016   Procedure: Primary CESAREAN SECTION;  Surgeon: Servando Salina, MD;  Location: [place];   Service: Obstetrics;  Laterality: N/A;  EDD: 10/19/16 Allergy: Hydrocodone-Acetaminophen  . LAPAROSCOPIC GELPORT ASSISTED MYOMECTOMY N/A 03/11/2015   Procedure: LAPAROSCOPIC  MYOMECTOMY--attempted;  Surgeon: Rubie Maid, MD;  Location: [facility];  Service: Gynecology;  Laterality: N/A;  . LAPAROTOMY N/A 03/11/2015   Procedure: LAPAROTOMY--MYOMECTOMY;  Surgeon: Rubie Maid, MD;  Location: [facility];  Service: Gynecology;  Laterality: N/A;  . MOUTH SURGERY  1996  . TOTAL THYROIDECTOMY Bilateral 03/05/2017   Dr. Maudie Mercury [facility]    Family History  Problem Relation Age of Onset  . Thyroid disease Mother   . Hypertension Mother   . Cancer Maternal Aunt   . Cancer Maternal Grandfather     Social History   Socioeconomic History  . Marital status: Single    Spouse name: Not on file  . Number of children: 1  . Years of education: Not on file  . Highest education level: Bachelor's degree (e.g., BA, AB, BS)  Occupational History  . Occupation: Glass blower/designer    Comment: works for [employer] in [employer]  . Financial resource strain: Not hard at all  . Food insecurity:    Worry: Never true    Inability: Never true  . Transportation needs:    Medical: No    Non-medical: No  Tobacco Use  . Smoking status: Never Smoker  . Smokeless tobacco: Never Used  Substance and Sexual Activity  . Alcohol use:  Yes    Comment: occasionally, about once a month  . Drug use: No  . Sexual activity: Not Currently    Partners: Male    Birth control/protection: Condom  Lifestyle  . Physical activity:    Days per week: 0 days    Minutes per session: 0 min  . Stress: Not at all  Relationships  . Social connections:    Talks on phone: More than three times a week    Gets together: Once a week    Attends religious service: More than 4 times per year    Active member of club or organization: Yes    Attends meetings of clubs or organizations: More than 4 times per year    Relationship  status: Never married  . Intimate partner violence:    Fear of current or ex partner: No    Emotionally abused: No    Physically abused: No    Forced sexual activity: No  Other Topics Concern  . 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     Current Outpatient Medications:  .  hydrochlorothiazide (HYDRODIURIL) 12.5 MG tablet, Take 1 tablet (12.5 mg total) by mouth daily., Disp: 90 tablet, Rfl: 1 .  levothyroxine (SYNTHROID, LEVOTHROID) 175 MCG tablet, Take by mouth., Disp: , Rfl:  .  valACYclovir (VALTREX) 500 MG tablet, Take 1 tablet (500 mg total) by mouth 2 (two) times daily., Disp: 30 tablet, Rfl: 1 .  fluticasone (FLONASE) 50 MCG/ACT nasal spray, Place 2 sprays into both nostrils as needed., Disp: , Rfl:  .  levocetirizine (XYZAL) 5 MG tablet, Take 1 tablet (5 mg total) by mouth every evening. (Patient not taking: Reported on 08/05/2017), Disp: 30 tablet, Rfl: 2 .  Semaglutide (OZEMPIC) 0.25 or 0.5 MG/DOSE SOPN, Inject 0.5 mg into the skin once a week. (Patient not taking: Reported on 10/27/2017), Disp: 2 pen, Rfl: 2 .  Vitamin D, Ergocalciferol, 2000 units CAPS, Take 2,000 Units by mouth daily., Disp: , Rfl:   Allergies  Allergen Reactions  . Hydrocodone Itching    I personally reviewed active problem list, medication list, allergies, family history, social history with the patient/caregiver today.   ROS  Constitutional: Negative for fever or weight change.  Respiratory: Negative for cough and shortness of breath.   Cardiovascular: Negative for chest pain or palpitations.  Gastrointestinal: Negative for abdominal pain, no bowel changes.  Musculoskeletal: Negative for gait problem or joint swelling.  Skin: Negative for rash. She has hypertrichosis  Neurological: Negative for dizziness or  headache.  No other specific complaints in a complete review of systems (except as listed in HPI above).  Objective  Vitals:   10/27/17 1530 10/27/17 1543  BP: (!) 144/86 138/80  Pulse: 84   Resp: 16   Temp: 98.1 F (36.7 C)   TempSrc: Oral   SpO2: 99%   Weight: (!) 317 lb 3.2 oz (143.9 kg)   Height: 5\' 9"  (1.753 m)     Body mass index is 46.84 kg/m.  Physical Exam  Constitutional: Patient appears well-developed and well-nourished. Obese  No distress.  HEENT: head atraumatic, normocephalic, pupils equal and reactive to light,  neck supple, throat within normal limits, hypertrichosis  Cardiovascular: Normal rate, regular rhythm and normal heart sounds.  No murmur heard. No  BLE edema. Pulmonary/Chest: Effort normal and breath sounds normal. No respiratory distress. Abdominal: Soft.  There is no tenderness. Psychiatric: Patient has a normal mood and affect. behavior is normal. Judgment and thought content normal.  Recent Results (from the past 2160 hour(s))  TSH     Status: Abnormal   Collection Time: 08/05/17  3:35 PM  Result Value Ref Range   TSH 5.72 (H) mIU/L    Comment:           Reference Range .           > or = 20 Years  0.40-4.50 .                Pregnancy Ranges           First trimester    0.26-2.66           Second trimester   0.55-2.73           Third trimester    0.43-2.91       PHQ2/9: Depression screen [facility] 2/9 10/27/2017 08/05/2017 03/25/2017 12/22/2016 02/25/2016  Decreased Interest 1 0 0 0 0  Down, Depressed, Hopeless 0 0 0 0 0  PHQ - 2 Score 1 0 0 0 0  Altered sleeping 0 0 - - -  Tired, decreased energy 2 0 - - -  Change in appetite 2 0 - - -  Feeling bad or failure about yourself  0 0 - - -  Trouble concentrating 0 0 - - -  Moving slowly or fidgety/restless 0 0 - - -  Suicidal thoughts 0 0 - - -  PHQ-9 Score 5 0 - - -  Difficult doing work/chores Somewhat difficult Not difficult at all - - -     Fall Risk: Fall Risk  10/27/2017 08/05/2017  03/25/2017 01/14/2017 12/22/2016  Falls in the past year? No No No No No      Assessment & Plan   1. Post-surgical hypothyroidism  - TSH - levothyroxine (SYNTHROID, LEVOTHROID) 175 MCG tablet; Take 1 tablet (175 mcg total) by mouth daily. And one extra half on Tuesday and Thursdays  Dispense: 35 tablet; Refill: 0  2. Need for immunization against influenza  She will get it at work next week   3. History of thyroidectomy  - TSH - levothyroxine (SYNTHROID, LEVOTHROID) 175 MCG tablet; Take 1 tablet (175 mcg total) by mouth daily. And one extra half on Tuesday and Thursdays  Dispense: 35 tablet; Refill: 0  4. Dyspepsia  Advised to try to eat slower, take a tums or rollaid to work and take prn if symptoms occurs, and if no improvement let me know

## 2017-10-28 ENCOUNTER — Other Ambulatory Visit: Payer: Self-pay | Admitting: Family Medicine

## 2017-10-28 DIAGNOSIS — Z9009 Acquired absence of other part of head and neck: Secondary | ICD-10-CM

## 2017-10-28 DIAGNOSIS — E890 Postprocedural hypothyroidism: Secondary | ICD-10-CM

## 2017-10-28 MED ORDER — LEVOTHYROXINE SODIUM 200 MCG PO TABS: 200.0000 ug | ORAL_TABLET | Freq: Every day | ORAL | 0 refills | Status: DC

## 2017-11-15 ENCOUNTER — Encounter: Payer: Self-pay | Admitting: Family Medicine

## 2017-11-15 ENCOUNTER — Other Ambulatory Visit: Payer: Self-pay | Admitting: Family Medicine

## 2017-11-15 DIAGNOSIS — Z9009 Acquired absence of other part of head and neck: Secondary | ICD-10-CM

## 2017-11-15 DIAGNOSIS — E890 Postprocedural hypothyroidism: Secondary | ICD-10-CM

## 2017-11-15 MED ORDER — LEVOTHYROXINE SODIUM 200 MCG PO TABS: 200.0000 ug | ORAL_TABLET | Freq: Every day | ORAL | 1 refills | Status: DC

## 2017-12-07 ENCOUNTER — Other Ambulatory Visit: Payer: Self-pay | Admitting: Family Medicine

## 2017-12-07 DIAGNOSIS — E890 Postprocedural hypothyroidism: Secondary | ICD-10-CM

## 2017-12-07 DIAGNOSIS — Z9009 Acquired absence of other part of head and neck: Secondary | ICD-10-CM

## 2017-12-07 NOTE — Telephone Encounter (Signed)
Needs tsh done

## 2017-12-07 NOTE — Telephone Encounter (Signed)
Mailbox was full, so was unable to leave voicemail.

## 2017-12-08 NOTE — Telephone Encounter (Signed)
Yes. We can do it on her next visit

## 2017-12-08 NOTE — Telephone Encounter (Signed)
Pt returned call. Advised she needed to come in for TSH to be rechecked, however I am not seeing orders in system. Pt states that she has not requested a refill and this must be on autofill from pharmacy.  Pt wants to know if she can have this blood work done the day she has her physical.

## 2017-12-15 ENCOUNTER — Other Ambulatory Visit: Payer: Self-pay | Admitting: Family Medicine

## 2017-12-15 DIAGNOSIS — E890 Postprocedural hypothyroidism: Secondary | ICD-10-CM

## 2017-12-15 LAB — TSH: TSH: 3.66 m[IU]/L

## 2017-12-17 ENCOUNTER — Other Ambulatory Visit: Payer: Self-pay | Admitting: Family Medicine

## 2017-12-17 DIAGNOSIS — Z9009 Acquired absence of other part of head and neck: Secondary | ICD-10-CM

## 2017-12-17 DIAGNOSIS — E890 Postprocedural hypothyroidism: Secondary | ICD-10-CM

## 2017-12-17 MED ORDER — LEVOTHYROXINE SODIUM 200 MCG PO TABS: 200.0000 ug | ORAL_TABLET | Freq: Every day | ORAL | 1 refills | Status: DC

## 2017-12-23 ENCOUNTER — Encounter: Payer: 59 | Admitting: Family Medicine

## 2017-12-24 NOTE — Telephone Encounter (Signed)
Erroneous Entry  

## 2017-12-27 ENCOUNTER — Other Ambulatory Visit (HOSPITAL_COMMUNITY)
Admission: RE | Admit: 2017-12-27 | Discharge: 2017-12-27 | Disposition: A | Discharge status: Home / Self Care | Payer: 59 | Source: Ambulatory Visit | Attending: Family Medicine | Admitting: Family Medicine

## 2017-12-27 ENCOUNTER — Ambulatory Visit (INDEPENDENT_AMBULATORY_CARE_PROVIDER_SITE_OTHER): Payer: 59 | Admitting: Family Medicine

## 2017-12-27 ENCOUNTER — Encounter: Payer: Self-pay | Admitting: Family Medicine

## 2017-12-27 VITALS — BP 146/82 | HR 85 | Temp 97.7°F | Resp 16 | Ht 69.0 in | Wt 323.6 lb

## 2017-12-27 DIAGNOSIS — Z13220 Encounter for screening for lipoid disorders: Secondary | ICD-10-CM

## 2017-12-27 DIAGNOSIS — Z01419 Encounter for gynecological examination (general) (routine) without abnormal findings: Secondary | ICD-10-CM | POA: Diagnosis not present

## 2017-12-27 DIAGNOSIS — E559 Vitamin D deficiency, unspecified: Secondary | ICD-10-CM

## 2017-12-27 DIAGNOSIS — Z124 Encounter for screening for malignant neoplasm of cervix: Secondary | ICD-10-CM | POA: Insufficient documentation

## 2017-12-27 DIAGNOSIS — I10 Essential (primary) hypertension: Secondary | ICD-10-CM

## 2017-12-27 DIAGNOSIS — Z23 Encounter for immunization: Secondary | ICD-10-CM

## 2017-12-27 DIAGNOSIS — E8881 Metabolic syndrome: Secondary | ICD-10-CM

## 2017-12-27 DIAGNOSIS — Z1159 Encounter for screening for other viral diseases: Secondary | ICD-10-CM

## 2017-12-27 MED ORDER — TRIAMTERENE-HCTZ 37.5-25 MG PO CAPS: 1.0000 | ORAL_CAPSULE | Freq: Every day | ORAL | 0 refills | Status: DC

## 2017-12-27 NOTE — Progress Notes (Signed)
Name: Michelle Ware   MRN: [id number]    DOB: 06-16-1980   Date:12/27/2017       Progress Note  Subjective  Chief Complaint  Chief Complaint  Patient presents with  . Annual Exam    HPI   Patient presents for annual CPE and follow up  HTN: bp above goal, we will change to triamterene hctz, check labs today, no chest pain or palpitation   Vagina discharge: fishy odor started a couple of weeks ago, mild discharge, no pruritis.  Obesity:she has a long history of obesity, started at middle school. She lost weight on her senior year of HS - by eating lean cuisine meals, but gained it back a few years later. Highest weight is today at 319 lbs. She had thyroidectomy Feb 2019, had a child 09/2016. She is not drinking sweet beverages on a regular basis. She has been eating fast food but is trying to meal prep again, she eats when bored or stressed. Discussed mindfulness.   Metabolic syndrome/PCOS: she has hypertrichosis, oligomenorrhea,acanthosis nigricans, increase in abdominal girth and also HTN, hgb A1C has been normal and there is no history of DM in her family. No polyphagia, polydipsia or polyuria. She used Ozempic before pregnancy but not since she had her baby 15 months ago  she states keeps forgetting to use it. It helped with weight loss.   Diet: she states she likes to eat and eats when emotional. She will contact therapist through her employer  Exercise: not currently        Office Visit from 08/05/2017 in [facility]  AUDIT-C Score  0     Depression:  Depression screen [facility] 2/9 12/27/2017 10/27/2017 08/05/2017 03/25/2017 12/22/2016  Decreased Interest 0 1 0 0 0  Down, Depressed, Hopeless 0 0 0 0 0  PHQ - 2 Score 0 1 0 0 0  Altered sleeping 1 0 0 - -  Tired, decreased energy 0 2 0 - -  Change in appetite 3 2 0 - -  Feeling bad or failure about yourself  3 0 0 - -  Trouble concentrating 0 0 0 - -  Moving slowly or fidgety/restless 0 0 0 - -  Suicidal thoughts  0 0 0 - -  PHQ-9 Score 7 5 0 - -  Difficult doing work/chores Not difficult at all Somewhat difficult Not difficult at all - -   Hypertension: BP Readings from Last 3 Encounters:  12/27/17 (!) 146/82  10/27/17 138/80  09/15/17 138/74   Obesity: Wt Readings from Last 3 Encounters:  12/27/17 (!) 323 lb 9.6 oz (146.8 kg)  10/27/17 (!) 317 lb 3.2 oz (143.9 kg)  09/15/17 (!) 319 lb 3.2 oz (144.8 kg)   BMI Readings from Last 3 Encounters:  12/27/17 47.79 kg/m  10/27/17 46.84 kg/m  09/15/17 47.14 kg/m    Hep C Screening:  STD testing and prevention (HIV/chl/gon/syphilis): she is not interested today  Intimate partner violence: negative  Sexual History/Pain during Intercourse: no Menstrual History/LMP/Abnormal Bleeding: regular cycles Incontinence Symptoms: some urgency   Advanced Care Planning: A voluntary discussion about advance care planning including the explanation and discussion of advance directives.  Discussed health care proxy and Living will, and the patient was able to identify a health care proxy as mother .  Patient does not have a living will at present time.  Breast cancer: start age 8  BRCA gene screening: N/A Cervical cancer screening: today  Per patient request   Lipids:  Lab  Results  Component Value Date   CHOL 158 12/22/2016   CHOL 154 11/22/2012   Lab Results  Component Value Date   HDL 66 12/22/2016   HDL 62 11/22/2012   Lab Results  Component Value Date   LDLCALC 79 12/22/2016   LDLCALC 83 11/22/2012   Lab Results  Component Value Date   TRIG 53 12/22/2016   TRIG 44 11/22/2012   Lab Results  Component Value Date   CHOLHDL 2.4 12/22/2016   No results found for: LDLDIRECT  Glucose:  Glucose  Date Value Ref Range Status  04/07/2016 84 65 - 99 mg/dL Final   Glucose, Bld  Date Value Ref Range Status  12/22/2016 83 65 - 99 mg/dL Final    Comment:    .            Fasting reference interval .     Skin cancer: discussed atypical  lesions   Patient Active Problem List   Diagnosis Date Noted  . History of thyroidectomy 08/05/2017  . Post-surgical hypothyroidism 03/25/2017  . S/P cesarean section: Indication: hx. of myomectomy and CHTN  09/28/2016  . Tenosynovitis of wrist 05/21/2016  . Morbid obesity with BMI of 40.0-44.9, adult ([place]) 03/31/2016  . H/O myomectomy 03/11/2015  . Essential hypertension 11/28/2014  . Obesity, Class III, BMI 40-49.9 (morbid obesity) ([place]) 11/28/2014  . Decreased thyroid stimulating hormone (TSH) level 07/24/2014  . Hypertrichosis 07/24/2014  . Vitamin D deficiency 07/24/2014  . PCOS (polycystic ovarian syndrome) 07/24/2014  . History of abnormal cervical Pap smear 11/24/2011  . Genital herpes 11/29/2008    Past Surgical History:  Procedure Laterality Date  . BIOPSY THYROID  07/2015   Dr. Gabriel Carina  . CESAREAN SECTION N/A 09/28/2016   Procedure: Primary CESAREAN SECTION;  Surgeon: Servando Salina, MD;  Location: [facility];  Service: Obstetrics;  Laterality: N/A;  EDD: 10/19/16 Allergy: Hydrocodone-Acetaminophen  . LAPAROSCOPIC GELPORT ASSISTED MYOMECTOMY N/A 03/11/2015   Procedure: LAPAROSCOPIC  MYOMECTOMY--attempted;  Surgeon: Rubie Maid, MD;  Location: [facility];  Service: Gynecology;  Laterality: N/A;  . LAPAROTOMY N/A 03/11/2015   Procedure: LAPAROTOMY--MYOMECTOMY;  Surgeon: Rubie Maid, MD;  Location: [facility];  Service: Gynecology;  Laterality: N/A;  . MOUTH SURGERY  1996  . TOTAL THYROIDECTOMY Bilateral 03/05/2017   Dr. Maudie [facility]    Family History  Problem Relation Age of Onset  . Thyroid disease Mother   . Hypertension Mother   . Cancer Maternal Aunt   . Cancer Maternal Grandfather   . Prostate cancer Maternal Grandfather   . Stroke Maternal Grandmother   . Heart failure Paternal Grandfather   . Liver disease Brother        Fatty Liver    Social History   Socioeconomic History  . Marital status: Single    Spouse name: Not on file  . Number of  children: 1  . Years of education: Not on file  . Highest education level: Bachelor's degree (e.g., BA, AB, BS)  Occupational History  . Occupation: Glass blower/designer    Comment: works for [employer] in [employer]  . Financial resource strain: Not hard at all  . Food insecurity:    Worry: Never true    Inability: Never true  . Transportation needs:    Medical: No    Non-medical: No  Tobacco Use  . Smoking status: Never Smoker  . Smokeless tobacco: Never Used  Substance and Sexual Activity  . Alcohol use: Yes    Comment:  occasionally, about once a month  . Drug use: No  . Sexual activity: Not Currently    Partners: Male    Birth control/protection: Condom  Lifestyle  . Physical activity:    Days per week: 0 days    Minutes per session: 0 min  . Stress: Not at all  Relationships  . Social connections:    Talks on phone: More than three times a week    Gets together: Once a week    Attends religious service: More than 4 times per year    Active member of club or organization: Yes    Attends meetings of clubs or organizations: More than 4 times per year    Relationship status: Never married  . Intimate partner violence:    Fear of current or ex partner: No    Emotionally abused: No    Physically abused: No    Forced sexual activity: No  Other Topics Concern  . 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     Current Outpatient Medications:  .  fluticasone (FLONASE) 50 MCG/ACT nasal spray, Place 2 sprays into both nostrils as needed., Disp: , Rfl:  .  hydrochlorothiazide (HYDRODIURIL) 12.5 MG tablet, Take 1 tablet (12.5 mg total) by mouth daily., Disp: 90 tablet, Rfl: 1 .  levocetirizine (XYZAL) 5 MG tablet, Take 1 tablet (5 mg total) by mouth every evening., Disp: 30 tablet,  Rfl: 2 .  levothyroxine (SYNTHROID, LEVOTHROID) 200 MCG tablet, Take 1 tablet (200 mcg total) by mouth daily., Disp: 30 tablet, Rfl: 1 .  valACYclovir (VALTREX) 500 MG tablet, Take 1 tablet (500 mg total) by mouth 2 (two) times daily., Disp: 30 tablet, Rfl: 1 .  Vitamin D, Ergocalciferol, 2000 units CAPS, Take 2,000 Units by mouth daily., Disp: , Rfl:  .  Semaglutide (OZEMPIC) 0.25 or 0.5 MG/DOSE SOPN, Inject 0.5 mg into the skin once a week. (Patient not taking: Reported on 10/27/2017), Disp: 2 pen, Rfl: 2  Allergies  Allergen Reactions  . Hydrocodone Itching     ROS  Constitutional: Negative for fever or weight change.  Respiratory: Negative for cough and shortness of breath.   Cardiovascular: Negative for chest pain or palpitations.  Gastrointestinal: Negative for abdominal pain, no bowel changes.  Musculoskeletal: Negative for gait problem or joint swelling.  Skin: Negative for rash.  Neurological: Negative for dizziness or headache.  No other specific complaints in a complete review of systems (except as listed in HPI above).  Objective  Vitals:   12/27/17 1113  BP: (!) 146/82  Pulse: 85  Resp: 16  Temp: 97.7 F (36.5 C)  TempSrc: Oral  SpO2: 99%  Weight: (!) 323 lb 9.6 oz (146.8 kg)  Height: '5\' 9"'  (1.753 m)    Body mass index is 47.79 kg/m.  Physical Exam  Constitutional: Patient appears well-developed and well-nourished. No distress.  HENT: Head: Normocephalic and atraumatic. Ears: B TMs ok, no erythema or effusion; Nose: Nose normal. Mouth/Throat: Oropharynx is clear and moist. No oropharyngeal exudate.  Eyes: Conjunctivae and EOM are normal. Pupils are equal, round, and reactive to light. No scleral icterus.  Neck: Normal range of motion. Neck supple. No JVD present. No thyromegaly present.  Cardiovascular: Normal rate, regular rhythm and normal heart sounds.  No murmur heard.  No BLE edema. Pulmonary/Chest: Effort normal and breath sounds normal. No respiratory  distress. Abdominal: Soft. Bowel sounds are normal, no distension. There is no tenderness. no masses Breast: no lumps or masses, no nipple discharge or rashes FEMALE GENITALIA:  External genitalia normal External urethra normal Vaginal vault normal without discharge or lesions Cervix normal without discharge or lesions Bimanual exam normal without masses RECTAL: no rectal masses or hemorrhoids Musculoskeletal: Normal range of motion, no joint effusions. No gross deformities Neurological: he is alert and oriented to person, place, and time. No cranial nerve deficit. Coordination, balance, strength, speech and gait are normal.  Skin: Skin is warm and dry. No rash noted. No erythema.  Psychiatric: Patient has a normal mood and affect. behavior is normal. Judgment and thought content normal.   Recent Results (from the past 2160 hour(s))  TSH     Status: Abnormal   Collection Time: 10/27/17  4:00 PM  Result Value Ref Range   TSH 5.97 (H) mIU/L    Comment:           Reference Range .           > or = 20 Years  0.40-4.50 .                Pregnancy Ranges           First trimester    0.26-2.66           Second trimester   0.55-2.73           Third trimester    0.43-2.91   TSH     Status: None   Collection Time: 12/15/17  3:39 PM  Result Value Ref Range   TSH 3.66 mIU/L    Comment:           Reference Range .           > or = 20 Years  0.40-4.50 .                Pregnancy Ranges           First trimester    0.26-2.66           Second trimester   0.55-2.73           Third trimester    0.43-2.91      PHQ2/9: Depression screen [facility] 2/9 12/27/2017 10/27/2017 08/05/2017 03/25/2017 12/22/2016  Decreased Interest 0 1 0 0 0  Down, Depressed, Hopeless 0 0 0 0 0  PHQ - 2 Score 0 1 0 0 0  Altered sleeping 1 0 0 - -  Tired, decreased energy 0 2 0 - -  Change in appetite 3 2 0 - -  Feeling bad or failure about yourself  3 0 0 - -  Trouble concentrating 0 0 0 - -  Moving slowly or  fidgety/restless 0 0 0 - -  Suicidal thoughts 0 0 0 - -  PHQ-9 Score 7 5 0 - -  Difficult doing work/chores Not difficult at all Somewhat difficult Not difficult at all - -    Fall Risk: Fall Risk  12/27/2017 10/27/2017 08/05/2017 03/25/2017 01/14/2017  Falls in the past year? 0 No No No No  Number falls in past yr: 0 - - - -  Injury with Fall? 0 - - - -   Functional Status Survey: Is the patient deaf or have difficulty hearing?: No Does the patient have difficulty seeing, even when wearing glasses/contacts?: No Does the patient have  difficulty concentrating, remembering, or making decisions?: No Does the patient have difficulty walking or climbing stairs?: No Does the patient have difficulty dressing or bathing?: No Does the patient have difficulty doing errands alone such as visiting a doctor's office or shopping?: No  Assessment & Plan  1. Well woman exam  - CBC with Differential/Platelet - COMPLETE METABOLIC PANEL WITH GFR - Lipid panel - Hemoglobin A1c  2. Obesity, Class III, BMI 40-49.9 (morbid obesity) ([place])  Discussed with the patient the risk posed by an increased BMI. Discussed importance of portion control, calorie counting and at least 150 minutes of physical activity weekly. Avoid sweet beverages and drink more water. Eat at least 6 servings of fruit and vegetables daily   3. Essential hypertension  - CBC with Differential/Platelet - COMPLETE METABOLIC PANEL WITH GFR Stop hctz and start Maxzide   4. Vitamin D deficiency  Taking otc supplementation   5. Metabolic syndrome  - Hemoglobin A1c  6. Lipid screening  - Lipid panel  7. Cervical cancer screening  - Cytology - PAP   -USPSTF grade A and B recommendations reviewed with patient; age-appropriate recommendations, preventive care, screening tests, etc discussed and encouraged; healthy living encouraged; see AVS for patient education given to patient -Discussed importance of 150 minutes of physical  activity weekly, eat two servings of fish weekly, eat one serving of tree nuts ( cashews, pistachios, pecans, almonds.Marland Kitchen) every other day, eat 6 servings of fruit/vegetables daily and drink plenty of water and avoid sweet beverages.

## 2017-12-27 NOTE — Patient Instructions (Signed)
Preventive Care 18-39 Years, Female Preventive care refers to lifestyle choices and visits with your health care provider that can promote health and wellness. What does preventive care include?  A yearly physical exam. This is also called an annual well check.  Dental exams once or twice a year.  Routine eye exams. Ask your health care provider how often you should have your eyes checked.  Personal lifestyle choices, including: ? Daily care of your teeth and gums. ? Regular physical activity. ? Eating a healthy diet. ? Avoiding tobacco and drug use. ? Limiting alcohol use. ? Practicing safe sex. ? Taking vitamin and mineral supplements as recommended by your health care provider. What happens during an annual well check? The services and screenings done by your health care provider during your annual well check will depend on your age, overall health, lifestyle risk factors, and family history of disease. Counseling Your health care provider may ask you questions about your:  Alcohol use.  Tobacco use.  Drug use.  Emotional well-being.  Home and relationship well-being.  Sexual activity.  Eating habits.  Work and work Statistician.  Method of birth control.  Menstrual cycle.  Pregnancy history.  Screening You may have the following tests or measurements:  Height, weight, and BMI.  Diabetes screening. This is done by checking your blood sugar (glucose) after you have not eaten for a while (fasting).  Blood pressure.  Lipid and cholesterol levels. These may be checked every 5 years starting at age 66.  Skin check.  Hepatitis C blood test.  Hepatitis B blood test.  Sexually transmitted disease (STD) testing.  BRCA-related cancer screening. This may be done if you have a family history of breast, ovarian, tubal, or peritoneal cancers.  Pelvic exam and Pap test. This may be done every 3 years starting at age 40. Starting at age 59, this may be done every 5  years if you have a Pap test in combination with an HPV test.  Discuss your test results, treatment options, and if necessary, the need for more tests with your health care provider. Vaccines Your health care provider may recommend certain vaccines, such as:  Influenza vaccine. This is recommended every year.  Tetanus, diphtheria, and acellular pertussis (Tdap, Td) vaccine. You may need a Td booster every 10 years.  Varicella vaccine. You may need this if you have not been vaccinated.  HPV vaccine. If you are 69 or younger, you may need three doses over 6 months.  Measles, mumps, and rubella (MMR) vaccine. You may need at least one dose of MMR. You may also need a second dose.  Pneumococcal 13-valent conjugate (PCV13) vaccine. You may need this if you have certain conditions and were not previously vaccinated.  Pneumococcal polysaccharide (PPSV23) vaccine. You may need one or two doses if you smoke cigarettes or if you have certain conditions.  Meningococcal vaccine. One dose is recommended if you are age 27-21 years and a first-year college student living in a residence hall, or if you have one of several medical conditions. You may also need additional booster doses.  Hepatitis A vaccine. You may need this if you have certain conditions or if you travel or work in places where you may be exposed to hepatitis A.  Hepatitis B vaccine. You may need this if you have certain conditions or if you travel or work in places where you may be exposed to hepatitis B.  Haemophilus influenzae type b (Hib) vaccine. You may need this if  you have certain risk factors.  Talk to your health care provider about which screenings and vaccines you need and how often you need them. This information is not intended to replace advice given to you by your health care provider. Make sure you discuss any questions you have with your health care provider. Document Released: 03/03/2001 Document Revised: 09/25/2015  Document Reviewed: 11/06/2014 Elsevier Interactive Patient Education  [employer].

## 2017-12-28 LAB — CBC WITH DIFFERENTIAL/PLATELET
Basophils Absolute: 43 cells/uL (ref 0–200)
Basophils Relative: 0.6 %
Eosinophils Absolute: 79 cells/uL (ref 15–500)
Eosinophils Relative: 1.1 %
HCT: 35.1 % (ref 35.0–45.0)
HEMOGLOBIN: 10.9 g/dL — AB (ref 11.7–15.5)
Lymphs Abs: 2916 cells/uL (ref 850–3900)
MCH: 24.4 pg — ABNORMAL LOW (ref 27.0–33.0)
MCHC: 31.1 g/dL — ABNORMAL LOW (ref 32.0–36.0)
MCV: 78.7 fL — ABNORMAL LOW (ref 80.0–100.0)
MPV: 9.9 fL (ref 7.5–12.5)
Monocytes Relative: 6.4 %
NEUTROS ABS: 3701 {cells}/uL (ref 1500–7800)
Neutrophils Relative %: 51.4 %
Platelets: 304 10*3/uL (ref 140–400)
RBC: 4.46 10*6/uL (ref 3.80–5.10)
RDW: 13.8 % (ref 11.0–15.0)
Total Lymphocyte: 40.5 %
WBC mixed population: 461 cells/uL (ref 200–950)
WBC: 7.2 10*3/uL (ref 3.8–10.8)

## 2017-12-28 LAB — COMPLETE METABOLIC PANEL WITH GFR
AG Ratio: 1.4 (calc) (ref 1.0–2.5)
ALT: 16 U/L (ref 6–29)
AST: 23 U/L (ref 10–30)
Albumin: 4.2 g/dL (ref 3.6–5.1)
Alkaline phosphatase (APISO): 46 U/L (ref 33–115)
BUN: 12 mg/dL (ref 7–25)
CALCIUM: 9 mg/dL (ref 8.6–10.2)
CO2: 27 mmol/L (ref 20–32)
Chloride: 106 mmol/L (ref 98–110)
Creat: 0.91 mg/dL (ref 0.50–1.10)
GFR, EST AFRICAN AMERICAN: 92 mL/min/{1.73_m2} (ref >=60)
GFR, EST NON AFRICAN AMERICAN: 79 mL/min/{1.73_m2} (ref >=60)
Globulin: 2.9 g/dL (calc) (ref 1.9–3.7)
Glucose, Bld: 83 mg/dL (ref 65–99)
Potassium: 4.1 mmol/L (ref 3.5–5.3)
Sodium: 142 mmol/L (ref 135–146)
Total Bilirubin: 0.2 mg/dL (ref 0.2–1.2)
Total Protein: 7.1 g/dL (ref 6.1–8.1)

## 2017-12-28 LAB — LIPID PANEL
Cholesterol: 136 mg/dL (ref <200)
HDL: 51 mg/dL (ref >50)
LDL Cholesterol (Calc): 70 mg/dL (calc)
Non-HDL Cholesterol (Calc): 85 mg/dL (calc) (ref <130)
Total CHOL/HDL Ratio: 2.7 (calc) (ref <5.0)
Triglycerides: 70 mg/dL (ref <150)

## 2017-12-28 LAB — VITAMIN D 25 HYDROXY (VIT D DEFICIENCY, FRACTURES): VIT D 25 HYDROXY: 11 ng/mL — AB (ref 30–100)

## 2017-12-28 LAB — HEPATITIS C ANTIBODY
Hepatitis C Ab: NONREACTIVE
SIGNAL TO CUT-OFF: 0.01 (ref <1.00)

## 2017-12-29 ENCOUNTER — Other Ambulatory Visit: Payer: Self-pay | Admitting: Family Medicine

## 2017-12-29 MED ORDER — VITAMIN D (ERGOCALCIFEROL) 1.25 MG (50000 UNIT) PO CAPS: 50000.0000 [IU] | ORAL_CAPSULE | ORAL | 0 refills | Status: DC

## 2017-12-30 LAB — CYTOLOGY - PAP
Adequacy: ABSENT
Bacterial vaginitis: POSITIVE — AB
CANDIDA VAGINITIS: NEGATIVE
Chlamydia: NEGATIVE
Diagnosis: NEGATIVE
NEISSERIA GONORRHEA: NEGATIVE
TRICH (WINDOWPATH): NEGATIVE

## 2018-01-02 ENCOUNTER — Other Ambulatory Visit: Payer: Self-pay | Admitting: Family Medicine

## 2018-01-12 ENCOUNTER — Other Ambulatory Visit: Payer: Self-pay | Admitting: Family Medicine

## 2018-01-12 DIAGNOSIS — Z9009 Acquired absence of other part of head and neck: Secondary | ICD-10-CM

## 2018-01-12 DIAGNOSIS — E890 Postprocedural hypothyroidism: Secondary | ICD-10-CM

## 2018-01-12 IMAGING — US US SOFT TISSUE HEAD/NECK
1 series · 13 of 25 positions shown · non-contrast
Comparison: None.

CLINICAL DATA: Enlarged thyroid.

EXAM:
THYROID ULTRASOUND
TECHNIQUE: Ultrasound examination of the thyroid gland and adjacent soft
tissues was performed.

[Series 1: us soft tissue head/neck · 0.09mm/px · 13 of 126 slices shown]
[im 1/126]
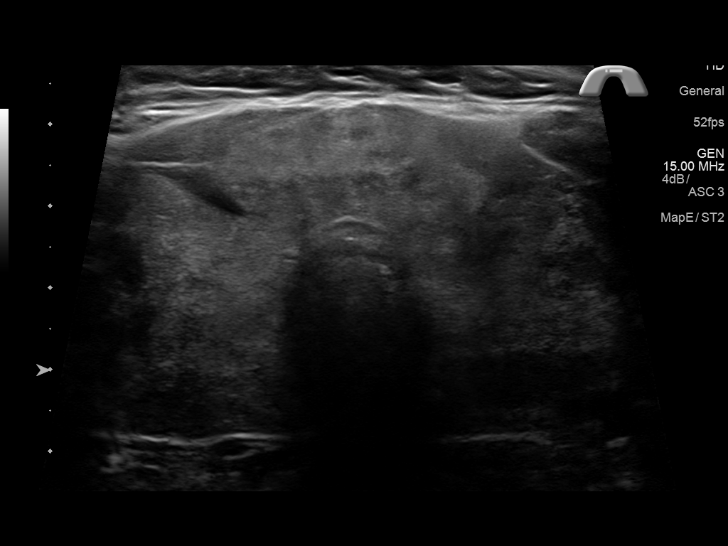
[im 11/126]
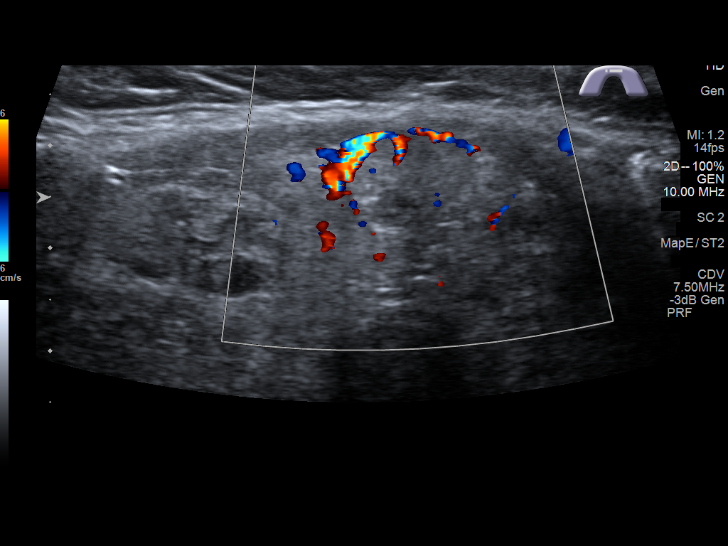
[im 21/126]
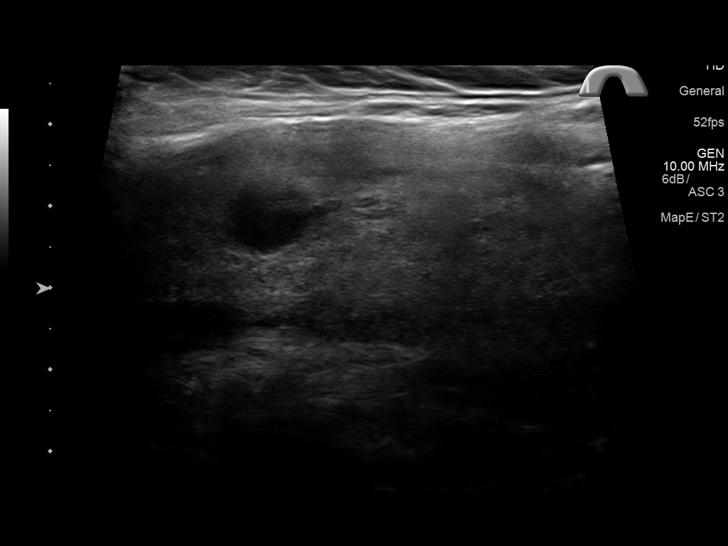
[im 32/126]
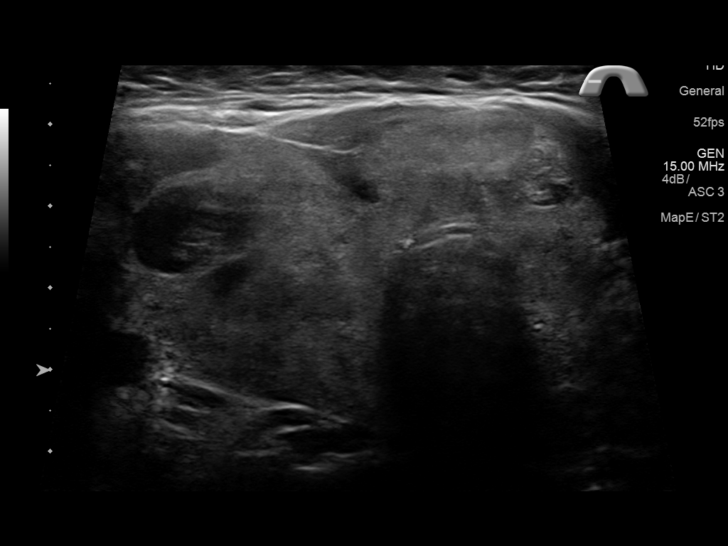
[im 42/126]
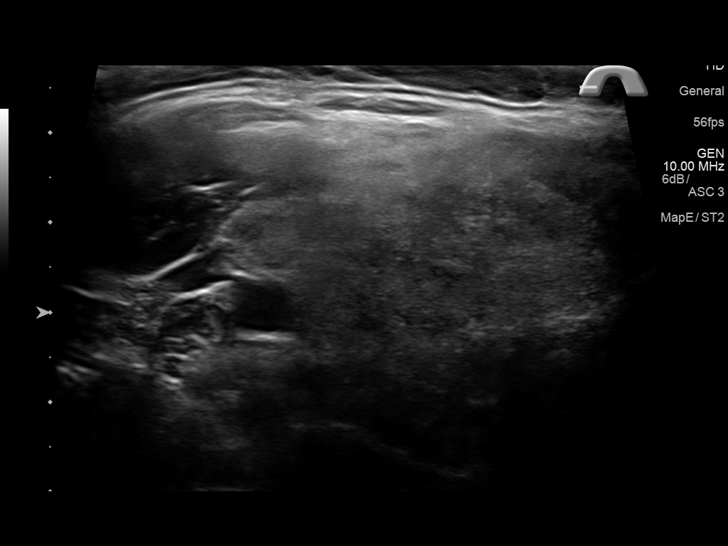
[im 53/126]
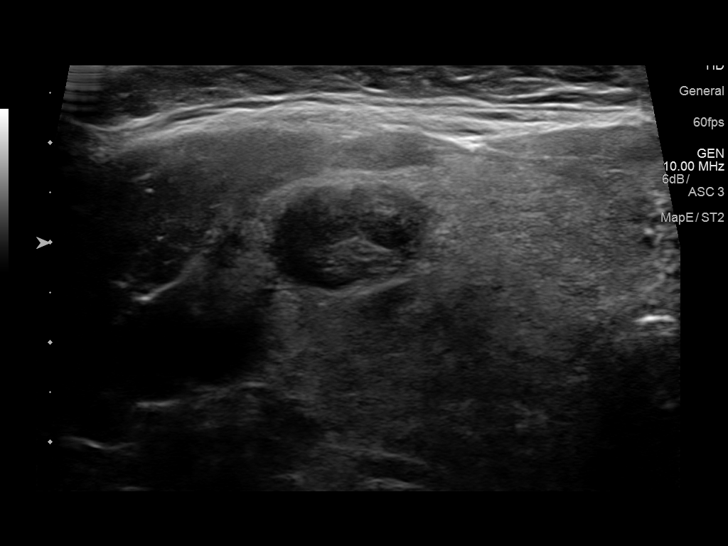
[im 63/126]
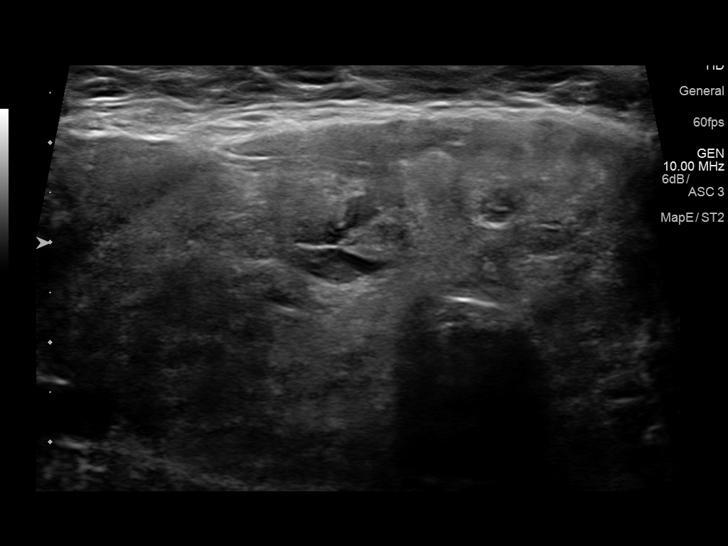
[im 73/126]
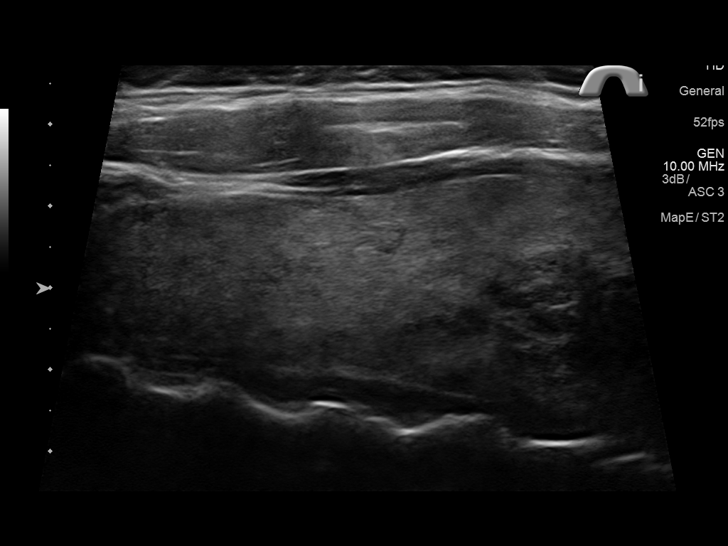
[im 84/126]
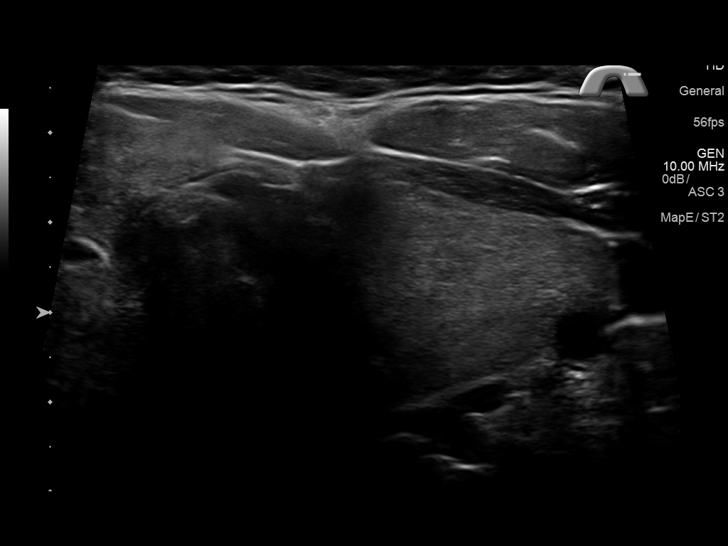
[im 94/126]
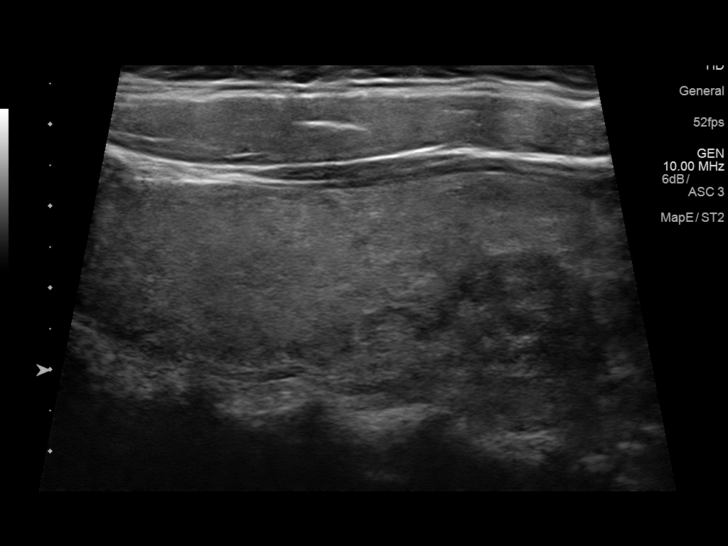
[im 105/126]
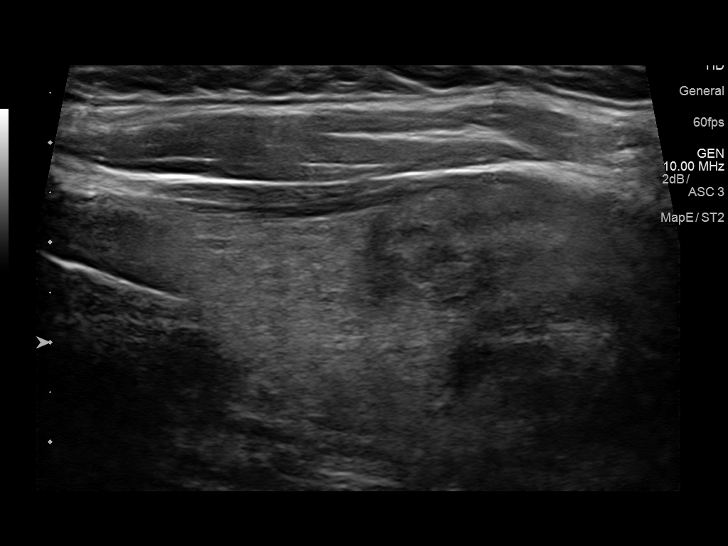
[im 115/126]
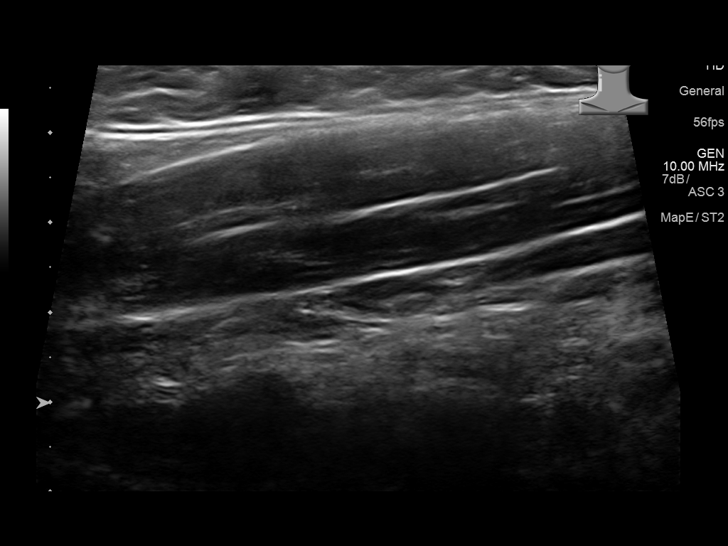
[im 126/126]
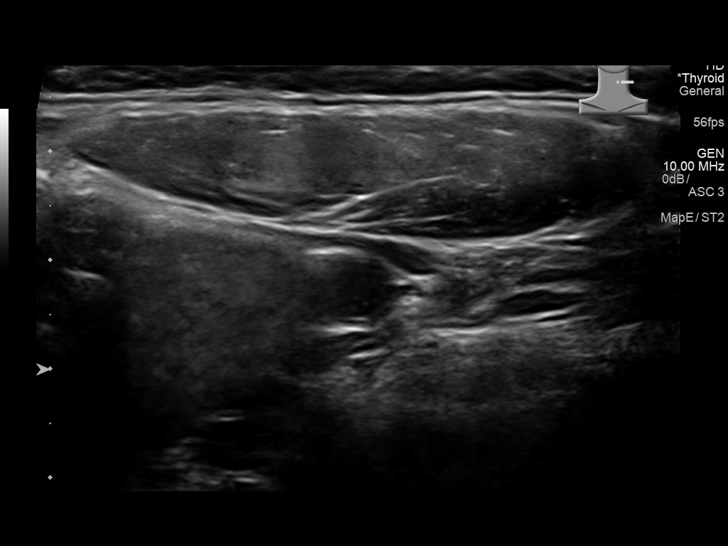

[13 of 25 positions shown; findings below may reference images not displayed]

FINDINGS: Right thyroid lobe

Measurements: 8.3 x 3.3 x 3.2 cm. Right thyroid tissue is
heterogeneous. Heterogeneous hypoechoic nodule in the right thyroid
lobe measures 1.7 x 1.0 x 1.6 cm. There is medial right thyroid
nodule that is heterogeneous and measures 1.3 x 1.2 x 1.1 cm.

Left thyroid lobe

Measurements: 7.4 x 2.5 x 3.6 cm. Left thyroid tissue is
heterogeneous. Heterogeneous nodule in the inferior left thyroid
lobe measures 2.1 x 2.2 x 2.2 cm. Nodule in the superior left
thyroid lobe is slightly hyperechoic and measures 1.6 x 1.8 x
cm.

Isthmus

Thickness: 1.6 cm. This tissue is heterogeneous. Isoechoic subtle
nodule measure 1.3 x 1.1 x 1.0 cm.

Lymphadenopathy

None visualized.
IMPRESSION: Multinodular goiter. The 1.7 cm right thyroid nodule meets criteria
for ultrasound-guided biopsy. The two left thyroid nodules also meet
criteria for ultrasound-guided biopsy. This recommendation follows
the consensus statement: Management of Thyroid Nodules Detected at
US: Society of Radiologists in Ultrasound Consensus Conference

## 2018-01-13 NOTE — Telephone Encounter (Signed)
Refill request for thyroid medication: Levothyroxine 200 mcg  Last Physical: 12/27/2017  Lab Results  Component Value Date   TSH 3.66 12/15/2017    Follow-ups on file. 03/29/2018

## 2018-01-21 ENCOUNTER — Ambulatory Visit (INDEPENDENT_AMBULATORY_CARE_PROVIDER_SITE_OTHER): Payer: 59

## 2018-01-21 VITALS — BP 136/86 | HR 79

## 2018-01-21 DIAGNOSIS — I10 Essential (primary) hypertension: Secondary | ICD-10-CM

## 2018-01-21 NOTE — Progress Notes (Signed)
Patient is here for a blood pressure check. Patient denies chest pain, palpitations, shortness of breath or visual disturbances. She does complain of having a headache.  At previous visit blood pressure was 146/82 with a heart rate of 85. Today during nurse visit first check blood pressure was 146/88 with heart rate of 77. After resting for 10 minutes it was 136/86 and heart rate was 79. She does take blood pressure medications as prescribed with no missed doses of medication.

## 2018-01-25 ENCOUNTER — Ambulatory Visit: Payer: 59 | Admitting: Family Medicine

## 2018-01-26 ENCOUNTER — Encounter: Payer: Self-pay | Admitting: Family Medicine

## 2018-01-26 ENCOUNTER — Ambulatory Visit: Payer: 59 | Admitting: Family Medicine

## 2018-01-26 VITALS — BP 124/70 | HR 83 | Temp 98.3°F | Resp 16 | Ht 69.0 in | Wt 323.5 lb

## 2018-01-26 DIAGNOSIS — R0981 Nasal congestion: Secondary | ICD-10-CM | POA: Diagnosis not present

## 2018-01-26 DIAGNOSIS — J301 Allergic rhinitis due to pollen: Secondary | ICD-10-CM

## 2018-01-26 DIAGNOSIS — R51 Headache: Secondary | ICD-10-CM | POA: Diagnosis not present

## 2018-01-26 DIAGNOSIS — R519 Headache, unspecified: Secondary | ICD-10-CM

## 2018-01-26 DIAGNOSIS — J029 Acute pharyngitis, unspecified: Secondary | ICD-10-CM | POA: Diagnosis not present

## 2018-01-26 MED ORDER — FLUTICASONE PROPIONATE 50 MCG/ACT NA SUSP: 2.0000 | NASAL | 3 refills | Status: DC | PRN

## 2018-01-26 MED ORDER — LEVOCETIRIZINE DIHYDROCHLORIDE 5 MG PO TABS: 5.0000 mg | ORAL_TABLET | Freq: Every evening | ORAL | 2 refills | Status: DC

## 2018-01-26 NOTE — Patient Instructions (Signed)
Headache Tips:  Keep a headache log: Record the date and time your headaches start and end, the severity of your pain (0-10), any possible triggers, what treatments you tried, and if any of those treatments worked.  Common Headache Triggers: Certain food additives, alcohol, artificial sweeteners (like aspartame), caffeine (overuse and when you stop suddenly), delayed or skipped meals, exercise, certain foods (chocolate, soft cheeses, red wine), bright lights, loud noises, menses, certain odors, oral contraceptives, depression and anxiety, sleep issues, smoke inhalation (smoking or second-hand smoke), stress, weather changes  Supplements known to reduce headaches and migraines are: Riboflavin - Vitamin B2 - goal is 200mg /day twice per day with food Magnesium - goal is 200mg  twice per day with food OR MigreLief - 1 tablet twice per day with food. This is a combination of the Riboflavin and Magnesium. It is only available at some health food stores and from [employer].   Please work to reduce your stress by taking at least 30 minutes to exercise, stretch, and/or meditate every day.  Drink plenty of water throughout the day, do not skip meals, and eat healthy foods that are not fried, fatty, high in sugar, or processed.

## 2018-01-26 NOTE — Progress Notes (Signed)
Name: Michelle Ware   MRN: [id number]    DOB: December 12, 1980   Date:01/26/2018       Progress Note  Subjective  Chief Complaint  Chief Complaint  Patient presents with  . Headache    for 2 weeks    HPI  Headaches - she notes new onset frontal/sinus headaches that were intermittent for about two weeks - pain was throbbing/dull. Headaches finally stopped on 01/21/2017 after she started her menses.  She took a BC powder 1 day and this did help, otherwise did not take anything.  She endorses a history of similar headaches associated with her cycle. She also notes cold symptoms started 2 days ago - scratchy throat, nasal congestion, and sneezing.  Denies vision changes, slurred speech, confusion, facial droop, extremity weakness.  Patient Active Problem List   Diagnosis Date Noted  . History of thyroidectomy 08/05/2017  . Post-surgical hypothyroidism 03/25/2017  . S/P cesarean section: Indication: hx. of myomectomy and CHTN  09/28/2016  . Tenosynovitis of wrist 05/21/2016  . Morbid obesity with BMI of 40.0-44.9, adult ([place]) 03/31/2016  . H/O myomectomy 03/11/2015  . Essential hypertension 11/28/2014  . Obesity, Class III, BMI 40-49.9 (morbid obesity) ([place]) 11/28/2014  . Decreased thyroid stimulating hormone (TSH) level 07/24/2014  . Hypertrichosis 07/24/2014  . Vitamin D deficiency 07/24/2014  . PCOS (polycystic ovarian syndrome) 07/24/2014  . History of abnormal cervical Pap smear 11/24/2011  . Genital herpes 11/29/2008    Social History   Tobacco Use  . Smoking status: Never Smoker  . Smokeless tobacco: Never Used  Substance Use Topics  . Alcohol use: Yes    Comment: occasionally, about once a month     Current Outpatient Medications:  .  fluticasone (FLONASE) 50 MCG/ACT nasal spray, Place 2 sprays into both nostrils as needed., Disp: , Rfl:  .  levocetirizine (XYZAL) 5 MG tablet, Take 1 tablet (5 mg total) by mouth every evening., Disp: 30 tablet, Rfl: 2 .  levothyroxine  (SYNTHROID, LEVOTHROID) 200 MCG tablet, TAKE 1 TABLET BY MOUTH EVERY DAY, Disp: 30 tablet, Rfl: 2 .  triamterene-hydrochlorothiazide (DYAZIDE) 37.5-25 MG capsule, Take 1 each (1 capsule total) by mouth daily. In place of hctz, Disp: 90 capsule, Rfl: 0 .  valACYclovir (VALTREX) 500 MG tablet, Take 1 tablet (500 mg total) by mouth 2 (two) times daily., Disp: 30 tablet, Rfl: 1 .  Vitamin D, Ergocalciferol, (DRISDOL) 1.25 MG (50000 UT) CAPS capsule, Take 1 capsule (50,000 Units total) by mouth every 7 (seven) days., Disp: 12 capsule, Rfl: 0 .  Vitamin D, Ergocalciferol, 2000 units CAPS, Take 2,000 Units by mouth daily., Disp: , Rfl:  .  Semaglutide (OZEMPIC) 0.25 or 0.5 MG/DOSE SOPN, Inject 0.5 mg into the skin once a week. (Patient not taking: Reported on 10/27/2017), Disp: 2 pen, Rfl: 2  Allergies  Allergen Reactions  . Hydrocodone Itching    I personally reviewed active problem list, medication list, allergies, lab results with the patient/caregiver today.  ROS  Constitutional: Negative for fever or weight change.  Respiratory: Negative for cough and shortness of breath.   Cardiovascular: Negative for chest pain or palpitations.  Gastrointestinal: Negative for abdominal pain, no bowel changes.  Musculoskeletal: Negative for gait problem or joint swelling.  Skin: Negative for rash.  Neurological: Negative for dizziness or headache.  No other specific complaints in a complete review of systems (except as listed in HPI above).  Objective  Vitals:   01/26/18 0730  BP: 124/70  Pulse: 83  Resp:  16  Temp: 98.3 F (36.8 C)  TempSrc: Oral  SpO2: 99%  Weight: (!) 323 lb 8 oz (146.7 kg)  Height: 5\' 9"  (1.753 m)    Body mass index is 47.77 kg/m.  Nursing Note and Vital Signs reviewed.  Physical Exam  Constitutional: Patient appears well-developed and well-nourished. No distress.  HENT: Head: Normocephalic and atraumatic. Ears: bilateral TMs with no erythema or effusion; Nose: Nose  normal. Mouth/Throat: Oropharynx is clear and moist. No oropharyngeal exudate or tonsillar swelling. Voice quality is slightly hoarse. Eyes: Conjunctivae and EOM are normal. No scleral icterus.  Pupils are equal, round, and reactive to light.  Neck: Normal range of motion. Neck supple. No JVD present. No thyromegaly present.  Cardiovascular: Normal rate, regular rhythm and normal heart sounds.  No murmur heard. No BLE edema. Pulmonary/Chest: Effort normal and breath sounds normal. No respiratory distress. Musculoskeletal: Normal range of motion, no joint effusions. No gross deformities Neurological: Pt is alert and oriented to person, place, and time. No cranial nerve deficit. Coordination, balance, strength, speech and gait are normal.  Skin: Skin is warm and dry. No rash noted. No erythema.  Psychiatric: Patient has a normal mood and affect. behavior is normal. Judgment and thought content normal.  No results found for this or any previous visit (from the past 72 hour(s)).  Assessment & Plan  1. Nonintractable episodic headache, unspecified headache type - Headache triggers handout provided, keep headache diary if headaches return.  - Tylenol or Aleve PRN for headaches.  Did discuss rebound headaches in detail.  2. Sore throat - Humidifier, throat lozenges, Tylenol/Advil PRN, flonase, Xyzal  3. Nasal congestion - Flonase per orders.  4. Seasonal allergic rhinitis due to pollen - levocetirizine (XYZAL) 5 MG tablet; Take 1 tablet (5 mg total) by mouth every evening.  Dispense: 30 tablet; Refill: 2  -Red flags and when to present for emergency care or RTC including fever >101.19F, chest pain, shortness of breath, new/worsening/un-resolving symptoms, signs or symptoms of stroke reviewed with patient at time of visit. Follow up and care instructions discussed and provided in AVS.

## 2018-02-07 ENCOUNTER — Other Ambulatory Visit: Payer: Self-pay | Admitting: Family Medicine

## 2018-02-07 DIAGNOSIS — I10 Essential (primary) hypertension: Secondary | ICD-10-CM

## 2018-02-09 ENCOUNTER — Other Ambulatory Visit: Payer: Self-pay | Admitting: Family Medicine

## 2018-02-09 DIAGNOSIS — I10 Essential (primary) hypertension: Secondary | ICD-10-CM

## 2018-02-09 NOTE — Telephone Encounter (Signed)
Refill request for Hypertension medication:  Dyazide 37.5-25 mg  Last office visit pertaining to hypertension: 12/27/2017  BP Readings from Last 3 Encounters:  01/26/18 124/70  01/21/18 136/86  12/27/17 (!) 146/82     Lab Results  Component Value Date   CREATININE 0.91 12/27/2017   BUN 12 12/27/2017   NA 142 12/27/2017   K 4.1 12/27/2017   CL 106 12/27/2017   CO2 27 12/27/2017   Follow-ups on file. 03/29/2018

## 2018-03-23 ENCOUNTER — Other Ambulatory Visit: Payer: Self-pay | Admitting: Family Medicine

## 2018-03-24 ENCOUNTER — Other Ambulatory Visit: Payer: Self-pay | Admitting: Family Medicine

## 2018-03-24 DIAGNOSIS — Z9009 Acquired absence of other part of head and neck: Secondary | ICD-10-CM

## 2018-03-24 DIAGNOSIS — E890 Postprocedural hypothyroidism: Secondary | ICD-10-CM

## 2018-03-24 NOTE — Telephone Encounter (Signed)
Refill request for thyroid medication: Levothyroxine 200 mg  Last Physical: 12/27/2017   Lab Results  Component Value Date   TSH 3.66 12/15/2017    Follow-ups on file. 03/29/2018

## 2018-03-29 ENCOUNTER — Ambulatory Visit: Payer: 59 | Admitting: Family Medicine

## 2018-04-26 ENCOUNTER — Telehealth: Payer: Self-pay | Admitting: Family Medicine

## 2018-04-26 NOTE — Telephone Encounter (Signed)
Please advise on refill request. Per lab note looks like this may have been a short term then pt tries otc meds

## 2018-05-29 ENCOUNTER — Other Ambulatory Visit: Payer: Self-pay | Admitting: Family Medicine

## 2018-05-29 DIAGNOSIS — I10 Essential (primary) hypertension: Secondary | ICD-10-CM

## 2018-06-01 ENCOUNTER — Other Ambulatory Visit: Payer: Self-pay | Admitting: Family Medicine

## 2018-06-01 DIAGNOSIS — E890 Postprocedural hypothyroidism: Secondary | ICD-10-CM

## 2018-06-01 DIAGNOSIS — Z9009 Acquired absence of other part of head and neck: Secondary | ICD-10-CM

## 2018-06-01 NOTE — Telephone Encounter (Signed)
Refill request for thyroid medication: Levothyroxine 200 mcg   Lab Results  Component Value Date   TSH 3.66 12/15/2017    Follow-ups on file. 08/09/2018

## 2018-06-14 ENCOUNTER — Encounter: Payer: Self-pay | Admitting: Podiatry

## 2018-06-14 ENCOUNTER — Other Ambulatory Visit: Payer: Self-pay

## 2018-06-14 ENCOUNTER — Ambulatory Visit: Payer: 59 | Admitting: Podiatry

## 2018-06-14 VITALS — BP 132/71 | HR 98 | Temp 97.3°F

## 2018-06-14 DIAGNOSIS — L603 Nail dystrophy: Secondary | ICD-10-CM

## 2018-06-14 NOTE — Patient Instructions (Signed)
Urea 40% topical gel

## 2018-06-16 NOTE — Progress Notes (Signed)
   HPI: 38 year old female presenting today as a new patient with a chief complaint of thickening and discoloration of the right great toenail that began several months ago. She states she has had both of her big toenails removed in the past and the right one has grown back dystrophic. She denies pain unless she accidentally hits the nail against something. She has not done anything for treatment. Patient is here for further evaluation and treatment.   Past Medical History:  Diagnosis Date  . AMA (advanced maternal age) multigravida 33+, first trimester   . Anemia   . Dermatophytosis of foot   . Fibroid   . Goiter   . Gross hematuria   . Herpes, genital   . Hypertension   . Low grade squamous intraepithelial lesion (LGSIL)   . Morbid obesity with BMI of 40.0-44.9, adult ([place])   . Polycystic ovarian syndrome   . Polycystic ovaries   . Vaginal Pap smear, abnormal   . Vitamin D deficiency      Physical Exam: General: The patient is alert and oriented x3 in no acute distress.  Dermatology: Hyperkeratotic, discolored, thickened, onychodystrophy of the right great toenail. Skin is warm, dry and supple bilateral lower extremities. Negative for open lesions or macerations.  Vascular: Palpable pedal pulses bilaterally. No edema or erythema noted. Capillary refill within normal limits.  Neurological: Epicritic and protective threshold grossly intact bilaterally.   Musculoskeletal Exam: Range of motion within normal limits to all pedal and ankle joints bilateral. Muscle strength 5/5 in all groups bilateral.   Assessment: 1. Onychodystrophic nail right hallux   Plan of Care:  1. Patient evaluated.  2. Mechanical debridement of right great toenail performed using a nail nipper. Filed with dremel without incident.  3. Recommended OTC Urea 40% topical gel.  4. Return to clinic as needed.      Edrick Kins, DPM [facility]  Dr. Edrick Kins, DPM    [address], [place] [address]                Office [phone]  Fax [phone]

## 2018-06-28 ENCOUNTER — Ambulatory Visit: Payer: Self-pay

## 2018-06-28 NOTE — Telephone Encounter (Signed)
Patient called, mailbox full, unable to leave message.   Summary: right abdomen pain   Pt called and stated that she is having right abdomen pain. Pt would like a call back regarding

## 2018-06-28 NOTE — Telephone Encounter (Signed)
Patient called to discuss her abdominal pain. She says the pain is a dull, soreness type pain to the right of her belly button, she says a mild pain. She says she thought it was gas, but it's been there all day since this morning. She says it doesn't radiate anywhere else. She says nothing makes it worse or better, she hasn't taken anything for the pain. Denies any other symptoms, no vomiting, no urine symptoms, no constipation or diarrhea. I advised she could go to the UC, she says she would rather wait to see if there is a cancellation at the office because she says she was told no openings this week. I advised I will send this over to the office and someone will call tomorrow to let her know about an appointment or Dr. Ancil Boozer recommendation, she verbalized understanding.   Reason for Disposition . [1] MILD-MODERATE pain AND [2] constant AND [3] present > 2 hours  Answer Assessment - Initial Assessment Questions 1. LOCATION: "Where does it hurt?"      To the right of of the belly button 2. RADIATION: "Does the pain shoot anywhere else?" (e.g., chest, back)     No 3. ONSET: "When did the pain begin?" (e.g., minutes, hours or days ago)      This morning 4. SUDDEN: "Gradual or sudden onset?"     Sudden 5. PATTERN "Does the pain come and go, or is it constant?"    - If constant: "Is it getting better, staying the same, or worsening?"      (Note: Constant means the pain never goes away completely; most serious pain is constant and it progresses)     - If intermittent: "How long does it last?" "Do you have pain now?"     (Note: Intermittent means the pain goes away completely between bouts)     Constant 6. SEVERITY: "How bad is the pain?"  (e.g., Scale 1-10; mild, moderate, or severe)   - MILD (1-3): doesn't interfere with normal activities, abdomen soft and not tender to touch    - MODERATE (4-7): interferes with normal activities or awakens from sleep, tender to touch    - SEVERE (8-10):  excruciating pain, doubled over, unable to do any normal activities      Mild, dull soreness feeling 7. RECURRENT SYMPTOM: "Have you ever had this type of abdominal pain before?" If so, ask: "When was the last time?" and "What happened that time?"      No 8. CAUSE: "What do you think is causing the abdominal pain?"     I don't know 9. RELIEVING/AGGRAVATING FACTORS: "What makes it better or worse?" (e.g., movement, antacids, bowel movement)     Nothing 10. OTHER SYMPTOMS: "Has there been any vomiting, diarrhea, constipation, or urine problems?"     No 11. PREGNANCY: "Is there any chance you are pregnant?" "When was your last menstrual period?"      No  Protocols used: ABDOMINAL PAIN - [facility]

## 2018-07-01 ENCOUNTER — Encounter: Payer: Self-pay | Admitting: Family Medicine

## 2018-07-01 ENCOUNTER — Ambulatory Visit: Payer: 59 | Admitting: Family Medicine

## 2018-07-01 ENCOUNTER — Other Ambulatory Visit: Payer: Self-pay

## 2018-07-01 VITALS — BP 112/72 | HR 86 | Temp 98.2°F | Resp 16 | Ht 69.0 in | Wt 316.7 lb

## 2018-07-01 DIAGNOSIS — K5900 Constipation, unspecified: Secondary | ICD-10-CM

## 2018-07-01 DIAGNOSIS — R109 Unspecified abdominal pain: Secondary | ICD-10-CM | POA: Diagnosis not present

## 2018-07-01 MED ORDER — POLYETHYLENE GLYCOL 3350 17 GM/SCOOP PO POWD: ORAL | 1 refills | Status: DC

## 2018-07-01 NOTE — Progress Notes (Signed)
Name: Michelle Ware   MRN: [id number]    DOB: 08-05-1980   Date:07/01/2018       Progress Note  Subjective  Chief Complaint  Chief Complaint  Patient presents with  . Pain    right side of belly button    HPI  PT presents with concern for RIGHT central abdominal pain for about 4 days.  She notes the pain worsened and worsened over the course of a few days, but has now improved significantly since this morning.  She has had a lot more gas recently and thinks this may have been contributed.  Denies fevers/chills, NV or diarrhea.  Has had some constipation without blood in stool or dark and tarry stools.   Patient Active Problem List   Diagnosis Date Noted  . History of thyroidectomy 08/05/2017  . Post-surgical hypothyroidism 03/25/2017  . S/P cesarean section: Indication: hx. of myomectomy and CHTN  09/28/2016  . Tenosynovitis of wrist 05/21/2016  . Morbid obesity with BMI of 40.0-44.9, adult ([place]) 03/31/2016  . H/O myomectomy 03/11/2015  . Essential hypertension 11/28/2014  . Obesity, Class III, BMI 40-49.9 (morbid obesity) ([place]) 11/28/2014  . Decreased thyroid stimulating hormone (TSH) level 07/24/2014  . Hypertrichosis 07/24/2014  . Vitamin D deficiency 07/24/2014  . PCOS (polycystic ovarian syndrome) 07/24/2014  . History of abnormal cervical Pap smear 11/24/2011  . Genital herpes 11/29/2008    Social History   Tobacco Use  . Smoking status: Never Smoker  . Smokeless tobacco: Never Used  Substance Use Topics  . Alcohol use: Yes    Comment: occasionally, about once a month     Current Outpatient Medications:  .  fluticasone (FLONASE) 50 MCG/ACT nasal spray, Place 2 sprays into both nostrils as needed., Disp: 16 g, Rfl: 3 .  levocetirizine (XYZAL) 5 MG tablet, Take 1 tablet (5 mg total) by mouth every evening., Disp: 30 tablet, Rfl: 2 .  levothyroxine (SYNTHROID) 200 MCG tablet, TAKE 1 TABLET BY MOUTH EVERY DAY, Disp: 30 tablet, Rfl: 0 .   triamterene-hydrochlorothiazide (DYAZIDE) 37.5-25 MG capsule, TAKE 1 CAPSULE BY MOUTH EVERY DAY, Disp: 30 capsule, Rfl: 1 .  valACYclovir (VALTREX) 500 MG tablet, Take 1 tablet (500 mg total) by mouth 2 (two) times daily., Disp: 30 tablet, Rfl: 1 .  Vitamin D, Ergocalciferol, (DRISDOL) 1.25 MG (50000 UT) CAPS capsule, TAKE 1 CAPSULE (50,000 UNITS TOTAL) BY MOUTH EVERY 7 (SEVEN) DAYS., Disp: 4 capsule, Rfl: 2 .  Vitamin D, Ergocalciferol, 2000 units CAPS, Take 2,000 Units by mouth daily., Disp: , Rfl:  .  Semaglutide (OZEMPIC) 0.25 or 0.5 MG/DOSE SOPN, Inject 0.5 mg into the skin once a week., Disp: 2 pen, Rfl: 2  Allergies  Allergen Reactions  . Hydrocodone Itching    I personally reviewed active problem list, medication list, allergies, notes from last encounter, lab results with the patient/caregiver today.  ROS  Constitutional: Negative for fever or weight change.  Respiratory: Negative for cough and shortness of breath.   Cardiovascular: Negative for chest pain or palpitations.  Gastrointestinal: + for abdominal pain, +for bowel changes.  Musculoskeletal: Negative for gait problem or joint swelling.  Skin: Negative for rash.  Neurological: Negative for dizziness or headache.  No other specific complaints in a complete review of systems (except as listed in HPI above).  Objective  Vitals:   07/01/18 0931  BP: 112/72  Pulse: 86  Resp: 16  Temp: 98.2 F (36.8 C)  TempSrc: Oral  SpO2: 99%  Weight: (!) 316 lb  11.2 oz (143.7 kg)  Height: 5\' 9"  (1.753 m)    Body mass index is 46.77 kg/m.  Nursing Note and Vital Signs reviewed.  Physical Exam  Constitutional: Patient appears well-developed and well-nourished. Obese No distress.  HEENT: head atraumatic, normocephalic Cardiovascular: Normal rate, regular rhythm and normal heart sounds.  No murmur heard. No BLE edema. Pulmonary/Chest: Effort normal and breath sounds clear bilaterally. No respiratory distress. Abdominal:  Soft, bowel sounds normal, there is no tenderness, no HSM, No CVA tendernes Psychiatric: Patient has a normal mood and affect. behavior is normal. Judgment and thought content normal.  No results found for this or any previous visit (from the past 72 hour(s)).  Assessment & Plan  1. Constipation, unspecified constipation type - polyethylene glycol powder (GLYCOLAX/MIRALAX) 17 GM/SCOOP powder; Take twice daily until bowels are regular (About 7 days), then decrease to once daily  Dispense: 850 g; Refill: 1  2. Right sided abdominal pain - COMPLETE METABOLIC PANEL WITH GFR - CBC with Differential/Platelet  -Red flags and when to present for emergency care or RTC including fever >101.29F, chest pain, shortness of breath, new/worsening/un-resolving symptoms, reviewed with patient at time of visit. Follow up and care instructions discussed and provided in AVS.

## 2018-07-02 LAB — CBC WITH DIFFERENTIAL/PLATELET
Absolute Monocytes: 631 cells/uL (ref 200–950)
Basophils Absolute: 46 cells/uL (ref 0–200)
Basophils Relative: 0.6 %
Eosinophils Absolute: 62 cells/uL (ref 15–500)
Eosinophils Relative: 0.8 %
HCT: 34.6 % — ABNORMAL LOW (ref 35.0–45.0)
Hemoglobin: 11 g/dL — ABNORMAL LOW (ref 11.7–15.5)
Lymphs Abs: 2803 cells/uL (ref 850–3900)
MCH: 24.7 pg — ABNORMAL LOW (ref 27.0–33.0)
MCHC: 31.8 g/dL — ABNORMAL LOW (ref 32.0–36.0)
MCV: 77.8 fL — ABNORMAL LOW (ref 80.0–100.0)
MPV: 10.1 fL (ref 7.5–12.5)
Monocytes Relative: 8.2 %
Neutro Abs: 4158 cells/uL (ref 1500–7800)
Neutrophils Relative %: 54 %
Platelets: 314 10*3/uL (ref 140–400)
RBC: 4.45 10*6/uL (ref 3.80–5.10)
RDW: 13.8 % (ref 11.0–15.0)
Total Lymphocyte: 36.4 %
WBC: 7.7 10*3/uL (ref 3.8–10.8)

## 2018-07-02 LAB — COMPLETE METABOLIC PANEL WITH GFR
AG Ratio: 1.6 (calc) (ref 1.0–2.5)
ALT: 12 U/L (ref 6–29)
AST: 19 U/L (ref 10–30)
Albumin: 4.2 g/dL (ref 3.6–5.1)
Alkaline phosphatase (APISO): 49 U/L (ref 31–125)
BUN: 12 mg/dL (ref 7–25)
CO2: 28 mmol/L (ref 20–32)
Calcium: 8.7 mg/dL (ref 8.6–10.2)
Chloride: 104 mmol/L (ref 98–110)
Creat: 1.02 mg/dL (ref 0.50–1.10)
GFR, Est African American: 80 mL/min/{1.73_m2} (ref >=60)
GFR, Est Non African American: 69 mL/min/{1.73_m2} (ref >=60)
Globulin: 2.6 g/dL (calc) (ref 1.9–3.7)
Glucose, Bld: 86 mg/dL (ref 65–99)
Potassium: 3.7 mmol/L (ref 3.5–5.3)
Sodium: 139 mmol/L (ref 135–146)
Total Bilirubin: 0.2 mg/dL (ref 0.2–1.2)
Total Protein: 6.8 g/dL (ref 6.1–8.1)

## 2018-07-04 ENCOUNTER — Other Ambulatory Visit: Payer: Self-pay | Admitting: Family Medicine

## 2018-07-04 ENCOUNTER — Ambulatory Visit: Payer: 59 | Admitting: Family Medicine

## 2018-07-04 DIAGNOSIS — I10 Essential (primary) hypertension: Secondary | ICD-10-CM

## 2018-07-04 DIAGNOSIS — Z9009 Acquired absence of other part of head and neck: Secondary | ICD-10-CM

## 2018-07-04 DIAGNOSIS — E890 Postprocedural hypothyroidism: Secondary | ICD-10-CM

## 2018-08-02 ENCOUNTER — Other Ambulatory Visit: Payer: Self-pay

## 2018-08-02 ENCOUNTER — Encounter: Payer: Self-pay | Admitting: Family Medicine

## 2018-08-02 ENCOUNTER — Ambulatory Visit (INDEPENDENT_AMBULATORY_CARE_PROVIDER_SITE_OTHER): Payer: 59 | Admitting: Family Medicine

## 2018-08-02 VITALS — BP 136/84 | HR 82 | Temp 97.1°F | Resp 18 | Ht 69.0 in | Wt 321.2 lb

## 2018-08-02 DIAGNOSIS — E559 Vitamin D deficiency, unspecified: Secondary | ICD-10-CM

## 2018-08-02 DIAGNOSIS — Z01419 Encounter for gynecological examination (general) (routine) without abnormal findings: Secondary | ICD-10-CM

## 2018-08-02 DIAGNOSIS — E890 Postprocedural hypothyroidism: Secondary | ICD-10-CM

## 2018-08-02 DIAGNOSIS — L729 Follicular cyst of the skin and subcutaneous tissue, unspecified: Secondary | ICD-10-CM

## 2018-08-02 DIAGNOSIS — D649 Anemia, unspecified: Secondary | ICD-10-CM | POA: Diagnosis not present

## 2018-08-02 DIAGNOSIS — Z1239 Encounter for other screening for malignant neoplasm of breast: Secondary | ICD-10-CM | POA: Diagnosis not present

## 2018-08-02 DIAGNOSIS — I10 Essential (primary) hypertension: Secondary | ICD-10-CM

## 2018-08-02 DIAGNOSIS — A6000 Herpesviral infection of urogenital system, unspecified: Secondary | ICD-10-CM

## 2018-08-02 DIAGNOSIS — E8881 Metabolic syndrome: Secondary | ICD-10-CM

## 2018-08-02 DIAGNOSIS — Z9009 Acquired absence of other part of head and neck: Secondary | ICD-10-CM

## 2018-08-02 MED ORDER — VALACYCLOVIR HCL 500 MG PO TABS: 500.0000 mg | ORAL_TABLET | Freq: Two times a day (BID) | ORAL | 1 refills | Status: DC

## 2018-08-02 MED ORDER — TRIAMTERENE-HCTZ 37.5-25 MG PO CAPS: ORAL_CAPSULE | ORAL | 0 refills | Status: DC

## 2018-08-02 MED ORDER — VITAMIN D (ERGOCALCIFEROL) 1.25 MG (50000 UNIT) PO CAPS: 50000.0000 [IU] | ORAL_CAPSULE | ORAL | 2 refills | Status: DC

## 2018-08-02 MED ORDER — OZEMPIC (0.25 OR 0.5 MG/DOSE) 2 MG/1.5ML ~~LOC~~ SOPN: 0.5000 mg | PEN_INJECTOR | SUBCUTANEOUS | 2 refills | Status: DC

## 2018-08-02 NOTE — Progress Notes (Addendum)
Name: Michelle Ware   MRN: [id number]    DOB: 1980-02-14   Date:08/02/2018       Progress Note  Subjective  Chief Complaint  Chief Complaint  Patient presents with  . 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 Works at a plant. Explained it is a safety issue, advised larger size boots.   Skin cyst: she has a knot on right side of neck, going on for months, it was never read or tender, seems sub-cutaneous.   Metabolic Syndrome: she denies polyphagia, polydipsia or polyuria, due for repeat labs today, she would like to resume Ozempic.   S/p thyroidectomy: goiter treatment, not cancer, she is taking Synthroid 200 mcg daily and no palpitation lately.   Morbid obesity: she would like to go back on Ozempic, her BMI is above 40 with co-morbidities. She gained 5 lbs in the past month, she is not sure how.   Diet: she is not following a very healthy, he is eating quick meals with her son.  Exercise: she is walking int he pm's  USPSTF grade A and B recommendations    Office Visit from 07/01/2018 in [facility]  AUDIT-C Score  0     Hypertension: BP Readings from Last 3 Encounters:  08/02/18 136/84  07/01/18 112/72  06/14/18 132/71   Obesity: Wt Readings from Last 3 Encounters:  08/02/18 (!) 321 lb 3.2 oz (145.7 kg)  07/01/18 (!) 316 lb 11.2 oz (143.7 kg)  01/26/18 (!) 323 lb 8 oz (146.7 kg)   BMI Readings from Last 3 Encounters:  08/02/18 47.43 kg/m  07/01/18 46.77 kg/m  01/26/18 47.77 kg/m    Hep C Screening: negative during pregnancy  STD testing and prevention (HIV/chl/gon/syphilis): not interested  Intimate partner violence: negative  Sexual History/Pain during Intercourse: no pain during intercourse or  vaginal discharge  Menstrual History/LMP/Abnormal Bleeding: cycles are regular, uses condoms when she has intercourse  Incontinence Symptoms: she has urgency but can hold   Advanced Care Planning: A voluntary discussion about advance care planning including the explanation and discussion of advance directives.  Discussed health care proxy and Living will, and the patient was able to identify a health care proxy as parents .  Patient does not have a living will at present time  Breast cancer: she will schedule it  BRCA gene screening: N/A Cervical cancer screening: 12/2017 was normal   Osteoporosis Screening: discussed high calcium diet, vitamin D and exercise    Lipids:  Lab Results  Component Value Date   CHOL 136 12/27/2017   CHOL 158 12/22/2016   CHOL 154 11/22/2012   Lab Results  Component Value Date   HDL 51 12/27/2017   HDL 66 12/22/2016   HDL 62 11/22/2012   Lab Results  Component Value Date   LDLCALC 70 12/27/2017   LDLCALC 79 12/22/2016   LDLCALC 83 11/22/2012   Lab Results  Component Value Date   TRIG 70 12/27/2017   TRIG 53 12/22/2016   TRIG 44 11/22/2012   Lab Results  Component Value Date   CHOLHDL 2.7 12/27/2017   CHOLHDL 2.4 12/22/2016   No results found for: LDLDIRECT  Glucose:  Glucose, Bld  Date  Value Ref Range Status  07/01/2018 86 65 - 99 mg/dL Final    Comment:    .            Fasting reference interval .   12/27/2017 83 65 - 99 mg/dL Final    Comment:    .            Fasting reference interval .   12/22/2016 83 65 - 99 mg/dL Final    Comment:    .            Fasting reference interval .     Skin cancer: discussed atypical moles ECG:2018  Patient Active Problem List   Diagnosis Date Noted  . History of thyroidectomy 08/05/2017  . Post-surgical hypothyroidism 03/25/2017  . S/P cesarean section: Indication: hx. of myomectomy and CHTN  09/28/2016  . Tenosynovitis of wrist 05/21/2016  . Morbid obesity with BMI of 40.0-44.9,  adult ([place]) 03/31/2016  . H/O myomectomy 03/11/2015  . Essential hypertension 11/28/2014  . Obesity, Class III, BMI 40-49.9 (morbid obesity) ([place]) 11/28/2014  . Decreased thyroid stimulating hormone (TSH) level 07/24/2014  . Hypertrichosis 07/24/2014  . Vitamin D deficiency 07/24/2014  . PCOS (polycystic ovarian syndrome) 07/24/2014  . History of abnormal cervical Pap smear 11/24/2011  . Genital herpes 11/29/2008    Past Surgical History:  Procedure Laterality Date  . BIOPSY THYROID  07/2015   Dr. Gabriel Carina  . CESAREAN SECTION N/A 09/28/2016   Procedure: Primary CESAREAN SECTION;  Surgeon: Servando Salina, MD;  Location: [place];  Service: Obstetrics;  Laterality: N/A;  EDD: 10/19/16 Allergy: Hydrocodone-Acetaminophen  . LAPAROSCOPIC GELPORT ASSISTED MYOMECTOMY N/A 03/11/2015   Procedure: LAPAROSCOPIC  MYOMECTOMY--attempted;  Surgeon: Rubie Maid, MD;  Location: [facility];  Service: Gynecology;  Laterality: N/A;  . LAPAROTOMY N/A 03/11/2015   Procedure: LAPAROTOMY--MYOMECTOMY;  Surgeon: Rubie Maid, MD;  Location: [facility];  Service: Gynecology;  Laterality: N/A;  . MOUTH SURGERY  1996  . TOTAL THYROIDECTOMY Bilateral 03/05/2017   Dr. Maudie Mercury [facility]    Family History  Problem Relation Age of Onset  . Thyroid disease Mother   . Hypertension Mother   . Cancer Maternal Aunt   . Cancer Maternal Grandfather   . Prostate cancer Maternal Grandfather   . Stroke Maternal Grandmother   . Heart failure Paternal Grandfather   . Liver disease Brother        Fatty Liver    Social History   Socioeconomic History  . Marital status: Single    Spouse name: Not on file  . Number of children: 1  . Years of education: Not on file  . Highest education level: Bachelor's degree (e.g., BA, AB, BS)  Occupational History  . Occupation: Glass blower/designer    Comment: works for [employer] in [employer]  . Financial resource strain: Not hard at all  . Food insecurity    Worry:  Never true    Inability: Never true  . Transportation needs    Medical: No    Non-medical: No  Tobacco Use  . Smoking status: Never Smoker  . Smokeless tobacco: Never Used  Substance and Sexual Activity  . Alcohol use: Yes    Comment: occasionally, about once a month  . Drug use: No  . Sexual activity: Not Currently    Partners: Male    Birth control/protection: Condom  Lifestyle  . Physical activity    Days per week: 0 days    Minutes per session: 0 min  .  Stress: Not at all  Relationships  . Social connections    Talks on phone: More than three times a week    Gets together: Once a week    Attends religious service: More than 4 times per year    Active member of club or organization: Yes    Attends meetings of clubs or organizations: More than 4 times per year    Relationship status: Never married  . Intimate partner violence    Fear of current or ex partner: No    Emotionally abused: No    Physically abused: No    Forced sexual activity: No  Other Topics Concern  . 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     Current Outpatient Medications:  .  fluticasone (FLONASE) 50 MCG/ACT nasal spray, Place 2 sprays into both nostrils as needed., Disp: 16 g, Rfl: 3 .  levocetirizine (XYZAL) 5 MG tablet, Take 1 tablet (5 mg total) by mouth every evening., Disp: 30 tablet, Rfl: 2 .  levothyroxine (SYNTHROID) 200 MCG tablet, TAKE 1 TABLET BY MOUTH EVERY DAY, Disp: 30 tablet, Rfl: 0 .  Semaglutide (OZEMPIC) 0.25 or 0.5 MG/DOSE SOPN, Inject 0.5 mg into the skin once a week., Disp: 2 pen, Rfl: 2 .  triamterene-hydrochlorothiazide (DYAZIDE) 37.5-25 MG capsule, TAKE 1 CAPSULE BY MOUTH EVERY DAY, Disp: 90 capsule, Rfl: 0 .  valACYclovir (VALTREX) 500 MG tablet, Take 1 tablet (500 mg total) by mouth 2 (two)  times daily., Disp: 30 tablet, Rfl: 1 .  Vitamin D, Ergocalciferol, (DRISDOL) 1.25 MG (50000 UT) CAPS capsule, TAKE 1 CAPSULE (50,000 UNITS TOTAL) BY MOUTH EVERY 7 (SEVEN) DAYS., Disp: 4 capsule, Rfl: 2 .  Vitamin D, Ergocalciferol, 2000 units CAPS, Take 2,000 Units by mouth daily., Disp: , Rfl:  .  polyethylene glycol powder (GLYCOLAX/MIRALAX) 17 GM/SCOOP powder, Take twice daily until bowels are regular (About 7 days), then decrease to once daily (Patient not taking: Reported on 08/02/2018), Disp: 850 g, Rfl: 1  Allergies  Allergen Reactions  . Hydrocodone Itching     ROS  Constitutional: Negative for fever or weight change.  Respiratory: Negative for cough and shortness of breath.   Cardiovascular: Negative for chest pain or palpitations.  Gastrointestinal: Negative for abdominal pain, no bowel changes.  Musculoskeletal: Negative for gait problem or joint swelling.  Skin: Negative for rash.  Neurological: Negative for dizziness or headache.  No other specific complaints in a complete review of systems (except as listed in HPI above).  Objective  Vitals:   08/02/18 0900  BP: 136/84  Pulse: 82  Resp: 18  Temp: (!) 97.1 F (36.2 C)  TempSrc: Temporal  SpO2: 96%  Weight: (!) 321 lb 3.2 oz (145.7 kg)  Height: '5\' 9"'  (1.753 m)    Body mass index is 47.43 kg/m.  Physical Exam  Constitutional: Patient appears well-developed and well-nourished. No distress.  HENT: Head: Normocephalic and atraumatic. Ears: B TMs ok, no erythema or effusion; Nose: Nose normal. Mouth/Throat: Oropharynx is clear and moist. No oropharyngeal exudate.  Eyes: Conjunctivae and EOM are normal. Pupils are equal, round, and reactive to light. No scleral icterus.  Neck: Normal range of motion. Neck supple. No JVD present. No thyromegaly present.  Cardiovascular: Normal rate, regular rhythm and normal heart sounds.  No murmur heard. No BLE edema. Pulmonary/Chest: Effort normal and breath sounds normal. No  respiratory distress. Abdominal: Soft. Bowel sounds are normal, no distension. There is no tenderness. no masses Breast: no lumps or masses, no nipple discharge or rashes FEMALE GENITALIA: External genitalia normal External urethra normal RECTAL: not done Musculoskeletal: Normal range of motion, no joint effusions. No gross deformities Neurological: he is alert and oriented to person, place, and time. No cranial nerve deficit. Coordination, balance, strength, speech and gait are normal.  Skin: Skin is warm and dry. No rash noted. No erythema. Hirsutism , small cyst on left side of neck Psychiatric: Patient has a normal mood and affect. behavior is normal. Judgment and thought content normal.   Recent Results (from the past 2160 hour(s))  COMPLETE METABOLIC PANEL WITH GFR     Status: None   Collection Time: 07/01/18 10:04 AM  Result Value Ref Range   Glucose, Bld 86 65 - 99 mg/dL    Comment: .            Fasting reference interval .    BUN 12 7 - 25 mg/dL   Creat 1.02 0.50 - 1.10 mg/dL   GFR, Est Non African American 69 > OR = 60 mL/min/1.50m   GFR, Est African American 80 > OR = 60 mL/min/1.744m  BUN/Creatinine Ratio NOT APPLICABLE 6 - 22 (calc)   Sodium 139 135 - 146 mmol/L   Potassium 3.7 3.5 - 5.3 mmol/L   Chloride 104 98 - 110 mmol/L   CO2 28 20 - 32 mmol/L   Calcium 8.7 8.6 - 10.2 mg/dL   Total Protein 6.8 6.1 - 8.1 g/dL   Albumin 4.2 3.6 - 5.1 g/dL   Globulin 2.6 1.9 - 3.7 g/dL (calc)   AG Ratio 1.6 1.0 - 2.5 (calc)   Total Bilirubin 0.2 0.2 - 1.2 mg/dL   Alkaline phosphatase (APISO) 49 31 - 125 U/L   AST 19 10 - 30 U/L   ALT 12 6 - 29 U/L  CBC with Differential/Platelet     Status: Abnormal   Collection Time: 07/01/18 10:04 AM  Result Value Ref Range   WBC 7.7 3.8 - 10.8 Thousand/uL   RBC 4.45 3.80 - 5.10 Million/uL   Hemoglobin 11.0 (L) 11.7 - 15.5 g/dL   HCT 34.6 (L) 35.0 - 45.0 %   MCV 77.8 (L) 80.0 - 100.0 fL   MCH 24.7 (L) 27.0 - 33.0 pg   MCHC 31.8 (L)  32.0 - 36.0 g/dL   RDW 13.8 11.0 - 15.0 %   Platelets 314 140 - 400 Thousand/uL   MPV 10.1 7.5 - 12.5 fL   Neutro Abs 4,158 1,500 - 7,800 cells/uL   Lymphs Abs 2,803 850 - 3,900 cells/uL   Absolute Monocytes 631 200 - 950 cells/uL   Eosinophils Absolute 62 15 - 500 cells/uL   Basophils Absolute 46 0 - 200 cells/uL   Neutrophils Relative % 54 %   Total Lymphocyte 36.4 %   Monocytes Relative 8.2 %   Eosinophils Relative 0.8 %   Basophils Relative 0.6 %      PHQ2/9: [facility] 08/02/2018 07/01/2018 01/26/2018 12/27/2017 10/27/2017  Decreased Interest 0 0 0 0 1  Down, Depressed, Hopeless 0 0 0 0 0  PHQ - 2 Score 0 0 0 0 1  Altered sleeping 0 0 0 1 0  Tired, decreased energy 0 0 0 0 2  Change in appetite 2 0 3 3 2  Feeling bad or failure about yourself  0 0 1 3 0  Trouble concentrating 0 0 0 0 0  Moving slowly or fidgety/restless 0 0 0 0 0  Suicidal thoughts 0 0 0 0 0  PHQ-9 Score 2 0 '4 7 5  ' Difficult doing work/chores Not difficult at all Not difficult at all Not difficult at all Not difficult at all Somewhat difficult   Negative   Fall Risk: Fall Risk  07/01/2018 01/26/2018 12/27/2017 10/27/2017 08/05/2017  Falls in the past year? 0 0 0 No No  Number falls in past yr: 0 0 0 - -  Injury with Fall? 0 0 0 - -  Follow up Falls evaluation completed - - - -      Assessment & Plan   1. Well woman exam   2. Breast cancer screening  - MM 3D SCREEN BREAST BILATERAL; Future  3. Anemia, unspecified type  - CBC with Differential/Platelet - Iron, TIBC and Ferritin Panel - B12  4. Metabolic syndrome  - Hemoglobin A1c - Semaglutide,0.25 or 0.5MG/DOS, (OZEMPIC, 0.25 OR 0.5 MG/DOSE,) 2 MG/1.5ML SOPN; Inject 0.5 mg into the skin once a week.  Dispense: 2 pen; Refill: 2  5. Vitamin D deficiency  - VITAMIN D 25 Hydroxy (Vit-D Deficiency, Fractures) - Vitamin D, Ergocalciferol, (DRISDOL) 1.25 MG (50000 UT) CAPS capsule; Take 1 capsule (50,000 Units total) by mouth every  7 (seven) days.  Dispense: 4 capsule; Refill: 2  6. History of thyroidectomy  - TSH  7. Genital herpes simplex, unspecified site  - valACYclovir (VALTREX) 500 MG tablet; Take 1 tablet (500 mg total) by mouth 2 (two) times daily.  Dispense: 30 tablet; Refill: 1  8. Essential hypertension  - triamterene-hydrochlorothiazide (DYAZIDE) 37.5-25 MG capsule; TAKE 1 CAPSULE BY MOUTH EVERY DAY  Dispense: 90 capsule; Refill: 0  9. Obesity, Class III, BMI 40-49.9 (morbid obesity) (HCC)  - Semaglutide,0.25 or 0.5MG/DOS, (OZEMPIC, 0.25 OR 0.5 MG/DOSE,) 2 MG/1.5ML SOPN; Inject 0.5 mg into the skin once a week.  Dispense: 2 pen; Refill: 2  10. Skin cyst  - Ambulatory referral to Dermatology  -USPSTF grade A and B recommendations reviewed with patient; age-appropriate recommendations, preventive care, screening tests, etc discussed and encouraged; healthy living encouraged; see AVS for patient education given to patient -Discussed importance of 150 minutes of physical activity weekly, eat two servings of fish weekly, eat one serving of tree nuts ( cashews, pistachios, pecans, almonds.Marland Kitchen) every other day, eat 6 servings of fruit/vegetables daily and drink plenty of water and avoid sweet beverages.

## 2018-08-02 NOTE — Patient Instructions (Signed)

## 2018-08-03 LAB — CBC WITH DIFFERENTIAL/PLATELET
Absolute Monocytes: 551 cells/uL (ref 200–950)
Basophils Absolute: 32 cells/uL (ref 0–200)
Basophils Relative: 0.4 %
Eosinophils Absolute: 49 cells/uL (ref 15–500)
Eosinophils Relative: 0.6 %
HCT: 36.3 % (ref 35.0–45.0)
Hemoglobin: 11.8 g/dL (ref 11.7–15.5)
Lymphs Abs: 2981 cells/uL (ref 850–3900)
MCH: 25.3 pg — ABNORMAL LOW (ref 27.0–33.0)
MCHC: 32.5 g/dL (ref 32.0–36.0)
MCV: 77.7 fL — ABNORMAL LOW (ref 80.0–100.0)
MPV: 10.2 fL (ref 7.5–12.5)
Monocytes Relative: 6.8 %
Neutro Abs: 4487 cells/uL (ref 1500–7800)
Neutrophils Relative %: 55.4 %
Platelets: 271 10*3/uL (ref 140–400)
RBC: 4.67 10*6/uL (ref 3.80–5.10)
RDW: 14.4 % (ref 11.0–15.0)
Total Lymphocyte: 36.8 %
WBC: 8.1 10*3/uL (ref 3.8–10.8)

## 2018-08-03 LAB — HEMOGLOBIN A1C
Hgb A1c MFr Bld: 5.8 % of total Hgb — ABNORMAL HIGH (ref <5.7)
Mean Plasma Glucose: 120 (calc)
eAG (mmol/L): 6.6 (calc)

## 2018-08-03 LAB — VITAMIN D 25 HYDROXY (VIT D DEFICIENCY, FRACTURES): Vit D, 25-Hydroxy: 27 ng/mL — ABNORMAL LOW (ref 30–100)

## 2018-08-03 LAB — IRON,TIBC AND FERRITIN PANEL
%SAT: 28 % (calc) (ref 16–45)
Ferritin: 36 ng/mL (ref 16–154)
Iron: 99 ug/dL (ref 40–190)
TIBC: 356 mcg/dL (calc) (ref 250–450)

## 2018-08-03 LAB — VITAMIN B12: Vitamin B-12: 429 pg/mL (ref 200–1100)

## 2018-08-03 LAB — TSH: TSH: 12.86 mIU/L — ABNORMAL HIGH

## 2018-08-08 ENCOUNTER — Other Ambulatory Visit: Payer: Self-pay | Admitting: Family Medicine

## 2018-08-08 DIAGNOSIS — E890 Postprocedural hypothyroidism: Secondary | ICD-10-CM

## 2018-08-08 DIAGNOSIS — Z9009 Acquired absence of other part of head and neck: Secondary | ICD-10-CM

## 2018-08-09 ENCOUNTER — Encounter: Payer: 59 | Admitting: Family Medicine

## 2018-08-09 NOTE — Telephone Encounter (Signed)
States she has been taking her thyroid medication every morning with her BP medication. She has been compliant and states she regularly does take her medication.

## 2018-08-16 ENCOUNTER — Other Ambulatory Visit: Payer: Self-pay | Admitting: Family Medicine

## 2018-08-16 DIAGNOSIS — A6000 Herpesviral infection of urogenital system, unspecified: Secondary | ICD-10-CM

## 2018-08-25 ENCOUNTER — Other Ambulatory Visit: Payer: Self-pay

## 2018-08-25 ENCOUNTER — Ambulatory Visit
Admission: RE | Admit: 2018-08-25 | Discharge: 2018-08-25 | Disposition: A | Discharge status: Home / Self Care | Payer: 59 | Source: Ambulatory Visit | Attending: Family Medicine | Admitting: Family Medicine

## 2018-08-25 DIAGNOSIS — Z1239 Encounter for other screening for malignant neoplasm of breast: Secondary | ICD-10-CM

## 2018-08-25 DIAGNOSIS — Z1231 Encounter for screening mammogram for malignant neoplasm of breast: Secondary | ICD-10-CM | POA: Diagnosis present

## 2018-09-20 ENCOUNTER — Other Ambulatory Visit: Payer: Self-pay | Admitting: Family Medicine

## 2018-09-20 DIAGNOSIS — Z9009 Acquired absence of other part of head and neck: Secondary | ICD-10-CM

## 2018-09-20 DIAGNOSIS — E890 Postprocedural hypothyroidism: Secondary | ICD-10-CM

## 2018-09-21 NOTE — Telephone Encounter (Signed)
Called patient. Patient is aware and will be in on Tuesday for labs. She has enough medication to last her until her results come back.

## 2018-10-04 ENCOUNTER — Other Ambulatory Visit: Payer: Self-pay

## 2018-10-04 DIAGNOSIS — Z9009 Acquired absence of other part of head and neck: Secondary | ICD-10-CM

## 2018-10-04 DIAGNOSIS — E890 Postprocedural hypothyroidism: Secondary | ICD-10-CM

## 2018-10-04 MED ORDER — LEVOTHYROXINE SODIUM 200 MCG PO TABS: 200.0000 ug | ORAL_TABLET | Freq: Every day | ORAL | 0 refills | Status: DC

## 2018-10-05 ENCOUNTER — Other Ambulatory Visit: Payer: Self-pay | Admitting: Family Medicine

## 2018-10-05 DIAGNOSIS — E890 Postprocedural hypothyroidism: Secondary | ICD-10-CM

## 2018-10-05 DIAGNOSIS — Z9009 Acquired absence of other part of head and neck: Secondary | ICD-10-CM

## 2018-10-05 LAB — TSH: TSH: 2.91 mIU/L

## 2018-10-05 MED ORDER — LEVOTHYROXINE SODIUM 200 MCG PO TABS: 200.0000 ug | ORAL_TABLET | Freq: Every day | ORAL | 0 refills | Status: DC

## 2018-10-10 ENCOUNTER — Other Ambulatory Visit: Payer: Self-pay | Admitting: Family Medicine

## 2018-10-10 DIAGNOSIS — J301 Allergic rhinitis due to pollen: Secondary | ICD-10-CM

## 2018-10-10 DIAGNOSIS — R0981 Nasal congestion: Secondary | ICD-10-CM

## 2018-10-10 DIAGNOSIS — J029 Acute pharyngitis, unspecified: Secondary | ICD-10-CM

## 2018-10-10 MED ORDER — LEVOCETIRIZINE DIHYDROCHLORIDE 5 MG PO TABS: 5.0000 mg | ORAL_TABLET | Freq: Every evening | ORAL | 0 refills | Status: DC

## 2018-10-10 NOTE — Telephone Encounter (Addendum)
Patient was told she needed to come in for labs to get her levocetirizine (XYZAL) 5 MG tablet medication refilled.  She came in on 10/04/2018 but she has not received a refill and have it sent to her preferred [facility] pharmacy on [address]

## 2018-10-10 NOTE — Telephone Encounter (Signed)
Requested medication (s) are due for refill today:yes  Requested medication (s) are on the active medication list: yes  Last refill:  01/26/2018  Future visit scheduled: yes  Notes to clinic:  Ordering provider and pcp are different Patient had labs done on 10/04/2018   Requested Prescriptions  Pending Prescriptions Disp Refills   levocetirizine (XYZAL) 5 MG tablet 30 tablet 2    Sig: Take 1 tablet (5 mg total) by mouth every evening.     Ear, Nose, and Throat:  Antihistamines Passed - 10/10/2018 12:28 PM      Passed - Valid encounter within last 12 months    Recent Outpatient Visits          2 months ago Essential hypertension   [facility] [name], MD   3 months ago Constipation, unspecified constipation type   [place], FNP   8 months ago Nonintractable episodic headache, unspecified headache type   [facility] [place], [place]   9 months ago Well woman exam   [facility] [name], MD   11 months ago Post-surgical hypothyroidism   [facility] [name], MD      Future Appointments            In 3 weeks [name], MD [facility], [place]   In 3 months [name], MD [facility]

## 2018-10-16 ENCOUNTER — Encounter: Payer: Self-pay | Admitting: Family Medicine

## 2018-10-17 ENCOUNTER — Other Ambulatory Visit: Payer: Self-pay | Admitting: Family Medicine

## 2018-10-17 DIAGNOSIS — L729 Follicular cyst of the skin and subcutaneous tissue, unspecified: Secondary | ICD-10-CM

## 2018-10-19 ENCOUNTER — Ambulatory Visit: Payer: Self-pay

## 2018-10-19 NOTE — Telephone Encounter (Signed)
Returned call to patient who states she has sinus problems.  She states her symptoms started yesterday with just sneezing. She states today she has pressure and pain below her eyes.  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 She has no fever as of this AM reporting to work.  She has a tickle in her throat that causing a mild cough. She has sinus drainage. No sore throat or  lose of taste or smell. No diarrhea or SOB. Protocol for mild symptoms of COVID-19 read to patient.  She verbalized understanding.  Call transferred to office for scheduling. Reason for Disposition . [1] COVID-19 infection suspected by caller or triager AND [2] mild symptoms (cough, fever, or others) AND [id number] no complications or SOB  Answer Assessment - Initial Assessment Questions 1. COVID-19 DIAGNOSIS: "Who made your Coronavirus (COVID-19) diagnosis?" "Was it confirmed by a positive lab test?" If not diagnosed by a HCP, ask "Are there lots of cases (community spread) where you live?" (See public health department website, if unsure)     [place] 2. ONSET: "When did the COVID-19 symptoms start?"    Sneezing yesterday 3. WORST SYMPTOM: "What is your worst symptom?" (e.g., cough, fever, shortness of breath, muscle aches)    Tickle in throat 4. COUGH: "Do you have a cough?" If so, ask: "How bad is the cough?"      Mild tickle 5. FEVER: "Do you have a fever?" If so, ask: "What is your temperature, how was it measured, and when did it start?"    No this morning get checked at work 6. RESPIRATORY STATUS: "Describe your breathing?" (e.g., shortness of breath, wheezing, unable to speak)      no 7. BETTER-SAME-WORSE: "Are you getting better, staying the same or getting worse compared to yesterday?"  If getting worse, ask, "In what way?"     Worse sinus symptoms left side facial pain under eye 8. HIGH RISK DISEASE: "Do you have any chronic medical problems?" (e.g., asthma, heart or lung disease, weak immune  system, etc.)    no 9. PREGNANCY: "Is there any chance you are pregnant?" "When was your last menstrual period?"   No abstnance 10. OTHER SYMPTOMS: "Do you have any other symptoms?"  (e.g., chills, fatigue, headache, loss of smell or taste, muscle pain, sore throat)      none  Protocols used: CORONAVIRUS (COVID-19) DIAGNOSED OR SUSPECTED-A-AH

## 2018-10-20 ENCOUNTER — Other Ambulatory Visit: Payer: Self-pay

## 2018-10-20 ENCOUNTER — Ambulatory Visit (INDEPENDENT_AMBULATORY_CARE_PROVIDER_SITE_OTHER): Payer: 59 | Admitting: Family Medicine

## 2018-10-20 ENCOUNTER — Encounter: Payer: Self-pay | Admitting: Family Medicine

## 2018-10-20 VITALS — Temp 97.8°F

## 2018-10-20 DIAGNOSIS — R0981 Nasal congestion: Secondary | ICD-10-CM | POA: Diagnosis not present

## 2018-10-20 DIAGNOSIS — Z20822 Contact with and (suspected) exposure to covid-19: Secondary | ICD-10-CM

## 2018-10-20 NOTE — Progress Notes (Signed)
Name: Michelle Ware   MRN: [id number]    DOB: 01-Sep-1980   Date:10/20/2018       Progress Note  Subjective  Chief Complaint  Chief Complaint  Patient presents with  . Sinusitis    Onset x 2 days. Symptoms include sinus pressure and nasal congestion    I connected with  [name] on 10/20/18 at  2:00 PM EDT by telephone and verified that I am speaking with the correct person using two identifiers.   I discussed the limitations, risks, security and privacy concerns of performing an evaluation and management service by telephone and the availability of in person appointments. Staff also discussed with the patient that there may be a patient responsible charge related to this service. Patient Location: Home Provider Location: Office Additional Individuals present: None  HPI  Pt presents with concern for sinus pressure and nasal congestion - started about 3 days ago.  She started out with a lot of sneezing, then had some burning under the eyes in her sinuses.  She used her flonase and Xyzal with some relief of her symptoms.  No fevers - 97.23F; no shortness of breath, chest pain, loss of taste/smell, or sore throat.  Her son does go to Daycare - no recent COVID-19 outbreaks, no known exposure to COVID-19.   Patient Active Problem List   Diagnosis Date Noted  . History of thyroidectomy 08/05/2017  . Post-surgical hypothyroidism 03/25/2017  . S/P cesarean section: Indication: hx. of myomectomy and CHTN  09/28/2016  . Tenosynovitis of wrist 05/21/2016  . Morbid obesity with BMI of 40.0-44.9, adult ([place]) 03/31/2016  . H/O myomectomy 03/11/2015  . Essential hypertension 11/28/2014  . Obesity, Class III, BMI 40-49.9 (morbid obesity) ([place]) 11/28/2014  . Decreased thyroid stimulating hormone (TSH) level 07/24/2014  . Hypertrichosis 07/24/2014  . Vitamin D deficiency 07/24/2014  . PCOS (polycystic ovarian syndrome) 07/24/2014  . History of abnormal cervical Pap smear 11/24/2011  . Genital  herpes 11/29/2008    Social History   Tobacco Use  . Smoking status: Never Smoker  . Smokeless tobacco: Never Used  Substance Use Topics  . Alcohol use: Yes    Comment: occasionally, about once a month     Current Outpatient Medications:  .  fluticasone (FLONASE) 50 MCG/ACT nasal spray, Place 2 sprays into both nostrils as needed., Disp: 16 g, Rfl: 3 .  levocetirizine (XYZAL) 5 MG tablet, Take 1 tablet (5 mg total) by mouth every evening., Disp: 30 tablet, Rfl: 0 .  levothyroxine (SYNTHROID) 200 MCG tablet, Take 1 tablet (200 mcg total) by mouth daily. Take an extra half pill twice a week and recheck in 6 weeks, Disp: 90 tablet, Rfl: 0 .  triamterene-hydrochlorothiazide (DYAZIDE) 37.5-25 MG capsule, TAKE 1 CAPSULE BY MOUTH EVERY DAY, Disp: 90 capsule, Rfl: 0 .  valACYclovir (VALTREX) 500 MG tablet, Take 1 tablet (500 mg total) by mouth 2 (two) times daily. Once daily for prevention BID for outbreaks, Disp: 100 tablet, Rfl: 1 .  Vitamin D, Ergocalciferol, (DRISDOL) 1.25 MG (50000 UT) CAPS capsule, Take 1 capsule (50,000 Units total) by mouth every 7 (seven) days., Disp: 4 capsule, Rfl: 2 .  Semaglutide,0.25 or 0.5MG /DOS, (OZEMPIC, 0.25 OR 0.5 MG/DOSE,) 2 MG/1.5ML SOPN, Inject 0.5 mg into the skin once a week. (Patient not taking: Reported on 10/20/2018), Disp: 2 pen, Rfl: 2  Allergies  Allergen Reactions  . Hydrocodone Itching    I personally reviewed active problem list, medication list, allergies, notes from last encounter, lab  results with the patient/caregiver today.  ROS  Ten systems reviewed and is negative except as mentioned in HPI  Objective  Virtual encounter, vitals not obtained.  There is no height or weight on file to calculate BMI.  Nursing Note and Vital Signs reviewed.  Physical Exam   Pulmonary/Chest: Effort normal. No respiratory distress. Speaking in complete sentences Neurological: Pt is alert and oriented to person, place, and time. Speech is normal  Psychiatric: Patient has a normal mood and affect. behavior is normal. Judgment and thought content normal.  No results found for this or any previous visit (from the past 72 hour(s)).  Assessment & Plan  1. Sinus congestion - Her son is in daycare and very young; we will check for COVID-19 to be cautious, however I do suspect this is seasonal allergies at this time.  Continue Xyzal and flonase. - MYCHART COVID-19 HOME MONITORING PROGRAM - MyChart Temperature FLOWSHEET; Future - Novel Coronavirus, NAA ([facility])  -Red flags and when to present for emergency care or RTC including fever >101.28F, chest pain, shortness of breath, new/worsening/un-resolving symptoms, reviewed with patient at time of visit. Follow up and care instructions discussed and provided in AVS. - I discussed the assessment and treatment plan with the patient. The patient was provided an opportunity to ask questions and all were answered. The patient agreed with the plan and demonstrated an understanding of the instructions.  - The patient was advised to call back or seek an in-person evaluation if the symptoms worsen or if the condition fails to improve as anticipated.  I provided 16 minutes of non-face-to-face time during this encounter.  Hubbard Hartshorn, FNP

## 2018-10-21 LAB — NOVEL CORONAVIRUS, NAA: SARS-CoV-2, NAA: NOT DETECTED

## 2018-10-23 ENCOUNTER — Other Ambulatory Visit: Payer: Self-pay | Admitting: Family Medicine

## 2018-10-23 DIAGNOSIS — I10 Essential (primary) hypertension: Secondary | ICD-10-CM

## 2018-11-02 ENCOUNTER — Ambulatory Visit (INDEPENDENT_AMBULATORY_CARE_PROVIDER_SITE_OTHER): Payer: 59 | Admitting: Family Medicine

## 2018-11-02 ENCOUNTER — Encounter: Payer: Self-pay | Admitting: Family Medicine

## 2018-11-02 ENCOUNTER — Telehealth: Payer: Self-pay | Admitting: Family Medicine

## 2018-11-02 DIAGNOSIS — Z9009 Acquired absence of other part of head and neck: Secondary | ICD-10-CM | POA: Diagnosis not present

## 2018-11-02 DIAGNOSIS — E8881 Metabolic syndrome: Secondary | ICD-10-CM

## 2018-11-02 DIAGNOSIS — I10 Essential (primary) hypertension: Secondary | ICD-10-CM

## 2018-11-02 DIAGNOSIS — L729 Follicular cyst of the skin and subcutaneous tissue, unspecified: Secondary | ICD-10-CM

## 2018-11-02 DIAGNOSIS — E890 Postprocedural hypothyroidism: Secondary | ICD-10-CM

## 2018-11-02 MED ORDER — TRIAMTERENE-HCTZ 37.5-25 MG PO CAPS: 1.0000 | ORAL_CAPSULE | Freq: Every day | ORAL | 0 refills | Status: DC

## 2018-11-02 MED ORDER — TRULICITY 0.75 MG/0.5ML ~~LOC~~ SOAJ: 0.7500 mg | SUBCUTANEOUS | 2 refills | Status: DC

## 2018-11-02 NOTE — Progress Notes (Signed)
Name: Michelle Ware   MRN: [id number]    DOB: 08-Jan-1981   Date:11/02/2018       Progress Note  Subjective  Chief Complaint  Chief Complaint  Patient presents with  . Hypertension    I connected with  Michelle Ware  on 11/02/18 at  3:40 PM EDT by a video enabled telemedicine application and verified that I am speaking with the correct person using two identifiers.  I discussed the limitations of evaluation and management by telemedicine and the availability of in person appointments. The patient expressed understanding and agreed to proceed. Staff also discussed with the patient that there may be a patient responsible charge related to this service. Patient Location: parking lot at work  Provider Location: [place]   HPI   HTN: she is taking medication, bp was at goal on her last visit, no headaches , chest pain . She has a long history of palpitation and is stable - had telemetry since pregnancy   Metabolic Syndrome: she denies polyphagia, polydipsia or polyuria, due for repeat labs today, last A1C was 5.8% insurance declined Ozempic we will try Trulicity, discussed possible side effects   S/p thyroidectomy: goiter treatment, not cancer, she is taking Synthroid 200 mcg daily she denies dry skin  or change in bowel movements. Last TSH was back to normal range ( 09/2018)   Morbid obesity: she asked to to go back on Ozempic on her last visit , her BMI is above 40 with co-morbidities. However it was not covered by her insurance so she is out of medication again   Skin cyst: on her neck, she was seen by Dr. Phillip Heal and was given reassurance but is worried about it and would like to have a second opinion   Patient Active Problem List   Diagnosis Date Noted  . History of thyroidectomy 08/05/2017  . Post-surgical hypothyroidism 03/25/2017  . S/P cesarean section: Indication: hx. of myomectomy and CHTN  09/28/2016  . Tenosynovitis of wrist 05/21/2016  . Morbid obesity  with BMI of 40.0-44.9, adult ([place]) 03/31/2016  . H/O myomectomy 03/11/2015  . Essential hypertension 11/28/2014  . Obesity, Class III, BMI 40-49.9 (morbid obesity) ([place]) 11/28/2014  . Decreased thyroid stimulating hormone (TSH) level 07/24/2014  . Hypertrichosis 07/24/2014  . Vitamin D deficiency 07/24/2014  . PCOS (polycystic ovarian syndrome) 07/24/2014  . History of abnormal cervical Pap smear 11/24/2011  . Genital herpes 11/29/2008    Past Surgical History:  Procedure Laterality Date  . BIOPSY THYROID  07/2015   Dr. Gabriel Carina  . CESAREAN SECTION N/A 09/28/2016   Procedure: Primary CESAREAN SECTION;  Surgeon: Servando Salina, MD;  Location: [facility];  Service: Obstetrics;  Laterality: N/A;  EDD: 10/19/16 Allergy: Hydrocodone-Acetaminophen  . LAPAROSCOPIC GELPORT ASSISTED MYOMECTOMY N/A 03/11/2015   Procedure: LAPAROSCOPIC  MYOMECTOMY--attempted;  Surgeon: Rubie Maid, MD;  Location: [facility];  Service: Gynecology;  Laterality: N/A;  . LAPAROTOMY N/A 03/11/2015   Procedure: LAPAROTOMY--MYOMECTOMY;  Surgeon: Rubie Maid, MD;  Location: [facility];  Service: Gynecology;  Laterality: N/A;  . MOUTH SURGERY  1996  . TOTAL THYROIDECTOMY Bilateral 03/05/2017   Dr. Maudie Mercury [facility]    Family History  Problem Relation Age of Onset  . Thyroid disease Mother   . Hypertension Mother   . Cancer Maternal Aunt   . Cancer Maternal Grandfather   . Prostate cancer Maternal Grandfather   . Stroke Maternal Grandmother   . Breast cancer Paternal Grandmother   . Heart failure Paternal  Grandfather   . Liver disease Brother        Fatty Liver    Social History   Socioeconomic History  . Marital status: Single    Spouse name: Not on file  . Number of children: 1  . Years of education: Not on file  . Highest education level: Bachelor's degree (e.g., BA, AB, BS)  Occupational History  . Occupation: Glass blower/designer    Comment: works for [employer] in [employer]  . Financial  resource strain: Not hard at all  . Food insecurity    Worry: Never true    Inability: Never true  . Transportation needs    Medical: No    Non-medical: No  Tobacco Use  . Smoking status: Never Smoker  . Smokeless tobacco: Never Used  Substance and Sexual Activity  . Alcohol use: Yes    Comment: occasionally, about once a month  . Drug use: No  . Sexual activity: Not Currently    Partners: Male    Birth control/protection: Condom  Lifestyle  . Physical activity    Days per week: 0 days    Minutes per session: 0 min  . Stress: Not at all  Relationships  . Social connections    Talks on phone: More than three times a week    Gets together: Once a week    Attends religious service: More than 4 times per year    Active member of club or organization: Yes    Attends meetings of clubs or organizations: More than 4 times per year    Relationship status: Never married  . Intimate partner violence    Fear of current or ex partner: No    Emotionally abused: No    Physically abused: No    Forced sexual activity: No  Other Topics Concern  . 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     Current Outpatient Medications:  .  fluticasone (FLONASE) 50 MCG/ACT nasal spray, Place 2 sprays into both nostrils as needed., Disp: 16 g, Rfl: 3 .  levocetirizine (XYZAL) 5 MG tablet, Take 1 tablet (5 mg total) by mouth every evening., Disp: 30 tablet, Rfl: 0 .  levothyroxine (SYNTHROID) 200 MCG tablet, Take 1 tablet (200 mcg total) by mouth daily. Take an extra half pill twice a week and recheck in 6 weeks, Disp: 90 tablet, Rfl: 0 .  triamterene-hydrochlorothiazide (DYAZIDE) 37.5-25 MG capsule, TAKE 1 CAPSULE BY MOUTH EVERY DAY, Disp: 30 capsule, Rfl: 0 .  valACYclovir (VALTREX) 500 MG tablet, Take 1 tablet (500 mg  total) by mouth 2 (two) times daily. Once daily for prevention BID for outbreaks, Disp: 100 tablet, Rfl: 1 .  Vitamin D, Ergocalciferol, (DRISDOL) 1.25 MG (50000 UT) CAPS capsule, Take 1 capsule (50,000 Units total) by mouth every 7 (seven) days., Disp: 4 capsule, Rfl: 2  Allergies  Allergen Reactions  . Hydrocodone Itching    I personally reviewed active problem list, medication list, allergies, family history, social history, health maintenance with the patient/caregiver today.   ROS  Ten systems reviewed and is negative except as mentioned in HPI   Objective  Virtual encounter, vitals not obtained.  There is no height or weight on file to calculate BMI.  Physical Exam  Awake,  alert and oriented   PHQ2/9: Depression screen [facility] 2/9 11/02/2018 10/20/2018 08/02/2018 07/01/2018 01/26/2018  Decreased Interest 0 0 0 0 0  Down, Depressed, Hopeless 0 0 0 0 0  PHQ - 2 Score 0 0 0 0 0  Altered sleeping 0 0 0 0 0  Tired, decreased energy 0 1 0 0 0  Change in appetite 0 0 2 0 3  Feeling bad or failure about yourself  0 0 0 0 1  Trouble concentrating 0 0 0 0 0  Moving slowly or fidgety/restless 0 0 0 0 0  Suicidal thoughts - 0 0 0 0  PHQ-9 Score 0 1 2 0 4  Difficult doing work/chores - Not difficult at all Not difficult at all Not difficult at all Not difficult at all  Some recent data might be hidden   PHQ-2/9 Result is negative.    Fall Risk: Fall Risk  11/02/2018 10/20/2018 07/01/2018 01/26/2018 12/27/2017  Falls in the past year? 0 0 0 0 0  Number falls in past yr: 0 0 0 0 0  Injury with Fall? 0 0 0 0 0  Follow up - - Falls evaluation completed - -     Assessment & Plan    I discussed the assessment and treatment plan with the patient. The patient was provided an opportunity to ask questions and all were answered. The patient agreed with the plan and demonstrated an understanding of the instructions.  The patient was advised to call back or seek an in-person evaluation if the  symptoms worsen or if the condition fails to improve as anticipated.  I provided 25  minutes of non-face-to-face time during this encounter.

## 2018-11-02 NOTE — Telephone Encounter (Signed)
errenous °

## 2018-11-08 ENCOUNTER — Ambulatory Visit: Payer: 59

## 2018-12-06 ENCOUNTER — Encounter: Payer: Self-pay | Admitting: Family Medicine

## 2018-12-08 ENCOUNTER — Other Ambulatory Visit: Payer: Self-pay | Admitting: Family Medicine

## 2018-12-08 DIAGNOSIS — E890 Postprocedural hypothyroidism: Secondary | ICD-10-CM

## 2018-12-08 DIAGNOSIS — Z9009 Acquired absence of other part of head and neck: Secondary | ICD-10-CM

## 2018-12-29 ENCOUNTER — Ambulatory Visit: Payer: Self-pay

## 2018-12-29 NOTE — Telephone Encounter (Signed)
Pt. Called and reported onset of light headed feeling that has been intermittent over past 2 days.  Stated it makes me feel "woozy", and "like my equilibrium is off".  Stated she had episode of blurry vision 2 nights ago; denied knowing how long the episode lasted.  Reported she just went to bed.  Denied feeling any headache, speech difficulty, or weakness in extremities.  Pt. reported pulse of 98 during call (checked by Apple watch);  rechecked several min. Later and reported pulse of 90.  Reported she drinks a lot of caffeinated beverages, and does not drink much water.  Stated she had similar light headed feeling about on month ago that resolved on own.  Pt. questioned if this has been caused by blood pressure medication.  Given care advice per protocol.  Encouraged to increase water intake and to decrease beverages with caffeine.  Pt. Verb. Understanding.  Appt. Scheduled for 3:20 PM, 12/11, with PCP.  Pt. Agreed with plan.     Reason for Disposition . [1] MODERATE dizziness (e.g., interferes with normal activities) AND [2] has NOT been evaluated by physician for this  (Exception: dizziness caused by heat exposure, sudden standing, or poor fluid intake)  Answer Assessment - Initial Assessment Questions 1. DESCRIPTION: "Describe your dizziness."     Makes me feel a little woozy 2. LIGHTHEADED: "Do you feel lightheaded?" (e.g., somewhat faint, woozy, weak upon standing)     Woozy, like equilibrium is off 3. VERTIGO: "Do you feel like either you or the room is spinning or tilting?" (i.e. vertigo)     Denied  4. SEVERITY: "How bad is it?"  "Do you feel like you are going to faint?" "Can you stand and walk?"   - MILD - walking normally   - MODERATE - interferes with normal activities (e.g., work, school)    - SEVERE - unable to stand, requires support to walk, feels like passing out now.     Mild to moderate 5. ONSET:  "When did the dizziness begin?"     Intermittent the past 2 days 6.  AGGRAVATING FACTORS: "Does anything make it worse?" (e.g., standing, change in head position)     *No Answer* 7. HEART RATE: "Can you tell me your heart rate?" "How many beats in 15 seconds?"  (Note: not all patients can do this)       98 bpm (checked on Apple watch)  while driving ; rechecked and at 90  8. CAUSE: "What do you think is causing the dizziness?"     BP medication change in September 9. RECURRENT SYMPTOM: "Have you had dizziness before?" If so, ask: "When was the last time?" "What happened that time?"     It happened about one month ago 10. OTHER SYMPTOMS: "Do you have any other symptoms?" (e.g., fever, chest pain, vomiting, diarrhea, bleeding)       Had episode of blurry vision 2 nights ago; denied headache, drinks a lot of caffeinated beverages; denied weakness in extrem., denied diffic. With speech.  11. PREGNANCY: "Is there any chance you are pregnant?" "When was your last menstrual period?"       LMP last month  Protocols used: DIZZINESS [facility]

## 2018-12-30 ENCOUNTER — Encounter: Payer: Self-pay | Admitting: Family Medicine

## 2018-12-30 ENCOUNTER — Ambulatory Visit (INDEPENDENT_AMBULATORY_CARE_PROVIDER_SITE_OTHER): Payer: 59 | Admitting: Family Medicine

## 2018-12-30 ENCOUNTER — Other Ambulatory Visit: Payer: Self-pay

## 2018-12-30 VITALS — BP 130/60 | HR 86 | Temp 97.1°F | Resp 16 | Ht 69.0 in | Wt 325.5 lb

## 2018-12-30 DIAGNOSIS — Z23 Encounter for immunization: Secondary | ICD-10-CM

## 2018-12-30 DIAGNOSIS — Z9009 Acquired absence of other part of head and neck: Secondary | ICD-10-CM

## 2018-12-30 DIAGNOSIS — Z862 Personal history of diseases of the blood and blood-forming organs and certain disorders involving the immune mechanism: Secondary | ICD-10-CM | POA: Diagnosis not present

## 2018-12-30 DIAGNOSIS — R42 Dizziness and giddiness: Secondary | ICD-10-CM

## 2018-12-30 DIAGNOSIS — I10 Essential (primary) hypertension: Secondary | ICD-10-CM | POA: Diagnosis not present

## 2018-12-30 DIAGNOSIS — E8881 Metabolic syndrome: Secondary | ICD-10-CM

## 2018-12-30 DIAGNOSIS — E890 Postprocedural hypothyroidism: Secondary | ICD-10-CM

## 2018-12-30 DIAGNOSIS — Z13220 Encounter for screening for lipoid disorders: Secondary | ICD-10-CM

## 2018-12-30 MED ORDER — BLOOD GLUCOSE METER KIT: PACK | 0 refills | Status: DC

## 2018-12-30 NOTE — Progress Notes (Signed)
Name: Michelle Ware   MRN: [id number]    DOB: [birth year]   Date:12/30/2018       Progress Note  Subjective  Chief Complaint  Chief Complaint  Patient presents with  . Dizziness    Intermittent x 48 hours. Concerned that it maybe related to her BP medications.    HPI  Dizziness: she states had one episode one month ago that was present intermittently for about one week, symptoms resolved on its own, however over the past couple of days it re-started. Described as a sensation of movement/feeling off balance, at times associated with some nausea, denies spinning sensation/tinnitus/headaches or hearing loss with episodes. She has hypertension, she has not checked her bp during episodes. Each episode can last about 20 minutes, seems to happens in the evening, after work   Metabolic syndrome: she is due for labs, explained that dizziness can happen with hyperglycemia or hypoglycemia. Her insurance did not approve Ozempic. We will give her a glucose monitor to check glucose fasting and during episodes of dizziness. We will also repeat lipid panel today   Thyroidectomy: taking medication, we will recheck labs since dizziness  History of anemia: also may be cause of dizziness and we will recheck levels. No pica or SOB  Patient Active Problem List   Diagnosis Date Noted  . History of thyroidectomy 08/05/2017  . Post-surgical hypothyroidism 03/25/2017  . S/P cesarean section: Indication: hx. of myomectomy and CHTN  09/28/2016  . Tenosynovitis of wrist 05/21/2016  . Morbid obesity with BMI of 40.0-44.9, adult ([place]) 03/31/2016  . H/O myomectomy 03/11/2015  . Essential hypertension 11/28/2014  . Obesity, Class III, BMI 40-49.9 (morbid obesity) ([place]) 11/28/2014  . Decreased thyroid stimulating hormone (TSH) level 07/24/2014  . Hypertrichosis 07/24/2014  . Vitamin D deficiency 07/24/2014  . PCOS (polycystic ovarian syndrome) 07/24/2014  . History of abnormal cervical Pap smear 11/24/2011  .  Genital herpes 11/29/2008    Past Surgical History:  Procedure Laterality Date  . BIOPSY THYROID  07/2015   Dr. Gabriel Carina  . CESAREAN SECTION N/A 09/28/2016   Procedure: Primary CESAREAN SECTION;  Surgeon: Servando Salina, MD;  Location: [facility];  Service: Obstetrics;  Laterality: N/A;  EDD: 10/19/16 Allergy: Hydrocodone-Acetaminophen  . LAPAROSCOPIC GELPORT ASSISTED MYOMECTOMY N/A 03/11/2015   Procedure: LAPAROSCOPIC  MYOMECTOMY--attempted;  Surgeon: Rubie Maid, MD;  Location: [facility];  Service: Gynecology;  Laterality: N/A;  . LAPAROTOMY N/A 03/11/2015   Procedure: LAPAROTOMY--MYOMECTOMY;  Surgeon: Rubie Maid, MD;  Location: [facility];  Service: Gynecology;  Laterality: N/A;  . MOUTH SURGERY  1996  . TOTAL THYROIDECTOMY Bilateral 03/05/2017   Dr. Maudie Mercury [facility]    Family History  Problem Relation Age of Onset  . Thyroid disease Mother   . Hypertension Mother   . Cancer Maternal Aunt   . Cancer Maternal Grandfather   . Prostate cancer Maternal Grandfather   . Stroke Maternal Grandmother   . Breast cancer Paternal Grandmother   . Heart failure Paternal Grandfather   . Liver disease Brother        Fatty Liver    Social History   Socioeconomic History  . Marital status: Single    Spouse name: Not on file  . Number of children: 1  . Years of education: Not on file  . Highest education level: Bachelor's degree (e.g., BA, AB, BS)  Occupational History  . Occupation: Glass blower/designer    Comment: works for [employer] in [employer]  . Smoking status: Never  Smoker  . Smokeless tobacco: Never Used  Substance and Sexual Activity  . Alcohol use: Yes    Comment: occasionally, about once a month  . Drug use: No  . Sexual activity: Not Currently    Partners: Male    Birth control/protection: Condom  Other Topics Concern  . 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   Social Determinants of Health   Financial Resource Strain:   . Difficulty of Paying Living Expenses: Not on file  Food Insecurity:   . Worried About Charity fundraiser in the Last Year: Not on file  . Ran Out of Food in the Last Year: Not on file  Transportation Needs:   . Lack of Transportation (Medical): Not on file  . Lack of Transportation (Non-Medical): Not on file  Physical Activity:   . Days of Exercise per Week: Not on file  . Minutes of Exercise per Session: Not on file  Stress:   . Feeling of Stress : Not on file  Social Connections:   . Frequency of Communication with Friends and Family: Not on file  . Frequency of Social Gatherings with Friends and Family: Not on file  . Attends Religious Services: Not on file  . Active Member of Clubs or Organizations: Not on file  . Attends Archivist Meetings: Not on file  . Marital Status: Not on file  Intimate Partner Violence:   . Fear of Current or Ex-Partner: Not on file  . Emotionally Abused: Not on file  . Physically Abused: Not on file  . Sexually Abused: Not on file     Current Outpatient Medications:  .  fluticasone (FLONASE) 50 MCG/ACT nasal spray, Place 2 sprays into both nostrils as needed., Disp: 16 g, Rfl: 3 .  levocetirizine (XYZAL) 5 MG tablet, Take 1 tablet (5 mg total) by mouth every evening., Disp: 30 tablet, Rfl: 0 .  levothyroxine (SYNTHROID) 200 MCG tablet, TAKE 1 TABLET BY MOUTH DAILY. TAKE AN EXTRA HALF PILL TWICE A WEEK AND RECHECK IN 6 WEEKS, Disp: 30 tablet, Rfl: 2 .  triamterene-hydrochlorothiazide (DYAZIDE) 37.5-25 MG capsule, Take 1 each (1 capsule total) by mouth daily., Disp: 90 capsule, Rfl: 0 .  valACYclovir (VALTREX) 500 MG tablet, Take 1 tablet (500 mg total) by mouth 2 (two) times daily. Once daily for prevention BID for outbreaks, Disp: 100 tablet, Rfl: 1 .  Vitamin D, Ergocalciferol,  (DRISDOL) 1.25 MG (50000 UT) CAPS capsule, Take 1 capsule (50,000 Units total) by mouth every 7 (seven) days., Disp: 4 capsule, Rfl: 2  Allergies  Allergen Reactions  . Hydrocodone Itching    I personally reviewed active problem list, medication list, allergies, family history, social history, health maintenance with the patient/caregiver today.   ROS  Constitutional: Negative for fever or weight change.  Respiratory: Negative for cough and shortness of breath.   Cardiovascular: Negative for chest pain or palpitations.  Gastrointestinal: Negative for abdominal pain, no bowel changes.  Musculoskeletal: Negative for gait problem or joint swelling.  Skin: Negative for rash.  Neurological: Positive  for dizziness but no  headache.  No other specific complaints in a complete review of systems (except as listed in HPI above).  Objective  Vitals:   12/30/18 1546  BP: 130/60  Pulse:  86  Resp: 16  Temp: (!) 97.1 F (36.2 C)  TempSrc: Temporal  SpO2: 99%  Weight: (!) 325 lb 8 oz (147.6 kg)  Height: '5\' 9"'  (1.753 m)    Body mass index is 48.07 kg/m.  Physical Exam  Constitutional: Patient appears well-developed and well-nourished. Obese No distress.  HEENT: head atraumatic, normocephalic, pupils equal and reactive to light, ears normal TM, neck supple, oral exam not done  Cardiovascular: Normal rate, regular rhythm and normal heart sounds.  No murmur heard. No BLE edema. Pulmonary/Chest: Effort normal and breath sounds normal. No respiratory distress. Abdominal: Soft.  There is no tenderness. Neurological : normal cranial nerves, normal sensation, romberg negative, no nystagmus  Psychiatric: Patient has a normal mood and affect. behavior is normal. Judgment and thought content normal.  Recent Results (from the past 2160 hour(s))  TSH     Status: None   Collection Time: 10/04/18 12:00 AM  Result Value Ref Range   TSH 2.91 mIU/L    Comment:           Reference Range .            > or = 20 Years  0.40-4.50 .                Pregnancy Ranges           First trimester    0.26-2.66           Second trimester   0.55-2.73           Third trimester    0.43-2.91   Novel Coronavirus, NAA ([facility])     Status: None   Collection Time: 10/20/18 12:00 AM   Specimen: Oropharyngeal(OP) collection in vial transport medium   OROPHARYNGEA  TESTING  Result Value Ref Range   SARS-CoV-2, NAA Not Detected Not Detected    Comment: Testing was performed using the cobas(R) SARS-CoV-2 test. This nucleic acid amplification test was developed and its performance characteristics determined by [employer], [employer]. Nucleic acid amplification tests include PCR and TMA. This test has not been FDA cleared or approved. This test has been authorized by FDA under an Emergency Use Authorization (EUA). This test is only authorized for the duration of time the declaration that circumstances exist justifying the authorization of the emergency use of in vitro diagnostic tests for detection of SARS-CoV-2 virus and/or diagnosis of COVID-19 infection under section 564(b)(1) of the Act, 21 U.S.C. 956LOV-5(I) (1), unless the authorization is terminated or revoked sooner. When diagnostic testing is negative, the possibility of a false negative result should be considered in the context of a patient's recent exposures and the presence of clinical signs and symptoms consistent with COVID-19. An individual without symptoms  of COVID-19 and who is not shedding SARS-CoV-2 virus would expect to have a negative (not detected) result in this assay.      PHQ2/9: Depression screen [facility] 2/9 12/30/2018 11/02/2018 10/20/2018 08/02/2018 07/01/2018  Decreased Interest 0 0 0 0 0  Down, Depressed, Hopeless 0 0 0 0 0  PHQ - 2 Score 0 0 0 0 0  Altered sleeping 0 0 0 0 0  Tired, decreased energy 0 0 1 0 0  Change in appetite 0 0 0 2 0  Feeling bad or failure about yourself  0 0 0 0 0  Trouble concentrating 0 0 0 0  0  Moving slowly or fidgety/restless 0 0 0 0 0  Suicidal thoughts 0 - 0 0 0  PHQ-9 Score 0 0  1 2 0  Difficult doing work/chores - - Not difficult at all Not difficult at all Not difficult at all  Some recent data might be hidden    phq 9 is negative  Fall Risk: Fall Risk  12/30/2018 11/02/2018 10/20/2018 07/01/2018 01/26/2018  Falls in the past year? 0 0 0 0 0  Number falls in past yr: 0 0 0 0 0  Injury with Fall? 0 0 0 0 0  Follow up - - - Falls evaluation completed -    Functional Status Survey: Is the patient deaf or have difficulty hearing?: No Does the patient have difficulty seeing, even when wearing glasses/contacts?: No Does the patient have difficulty concentrating, remembering, or making decisions?: No Does the patient have difficulty walking or climbing stairs?: No Does the patient have difficulty dressing or bathing?: No Does the patient have difficulty doing errands alone such as visiting a doctor's office or shopping?: No    Assessment & Plan  1. Dizziness  - COMPLETE METABOLIC PANEL WITH GFR - blood glucose meter kit and supplies; Dispense based on patient and insurance preference. Use up to four times daily as directed.  Dispense: 1 each; Refill: 0  2. Need for immunization against influenza  - Flu Vaccine QUAD 36+ mos IM  3. Essential hypertension  - TSH  4. History of anemia  - CBC with Differential/Platelet - TSH  5. History of thyroidectomy  - TSH  6. Lipid screening  - Lipid panel  7. Metabolic syndrome  - Hemoglobin A1c - blood glucose meter kit and supplies; Dispense based on patient and insurance preference. Use up to four times daily as directed.  Dispense: 1 each; Refill: 0

## 2018-12-30 NOTE — Patient Instructions (Signed)
Sugar fasting should be below 1400 ( normal is below 100 ) Two hours after meals around 180 Should never be below 80  Dizziness Dizziness is a common problem. It is a feeling of unsteadiness or light-headedness. You may feel like you are about to faint. Dizziness can lead to injury if you stumble or fall. Anyone can become dizzy, but dizziness is more common in older adults. This condition can be caused by a number of things, including medicines, dehydration, or illness. Follow these instructions at home: Eating and drinking  Drink enough fluid to keep your urine clear or pale yellow. This helps to keep you from becoming dehydrated. Try to drink more clear fluids, such as water.  Do not drink alcohol.  Limit your caffeine intake if told to do so by your health care provider. Check ingredients and nutrition facts to see if a food or beverage contains caffeine.  Limit your salt (sodium) intake if told to do so by your health care provider. Check ingredients and nutrition facts to see if a food or beverage contains sodium. Activity  Avoid making quick movements. ? Rise slowly from chairs and steady yourself until you feel okay. ? In the morning, first sit up on the side of the bed. When you feel okay, stand slowly while you hold onto something until you know that your balance is fine.  If you need to stand in one place for a long time, move your legs often. Tighten and relax the muscles in your legs while you are standing.  Do not drive or use heavy machinery if you feel dizzy.  Avoid bending down if you feel dizzy. Place items in your home so that they are easy for you to reach without leaning over. Lifestyle  Do not use any products that contain nicotine or tobacco, such as cigarettes and e-cigarettes. If you need help quitting, ask your health care provider.  Try to reduce your stress level by using methods such as yoga or meditation. Talk with your health care provider if you need  help to manage your stress. General instructions  Watch your dizziness for any changes.  Take over-the-counter and prescription medicines only as told by your health care provider. Talk with your health care provider if you think that your dizziness is caused by a medicine that you are taking.  Tell a friend or a family member that you are feeling dizzy. If he or she notices any changes in your behavior, have this person call your health care provider.  Keep all follow-up visits as told by your health care provider. This is important. Contact a health care provider if:  Your dizziness does not go away.  Your dizziness or light-headedness gets worse.  You feel nauseous.  You have reduced hearing.  You have new symptoms.  You are unsteady on your feet or you feel like the room is spinning. Get help right away if:  You vomit or have diarrhea and are unable to eat or drink anything.  You have problems talking, walking, swallowing, or using your arms, hands, or legs.  You feel generally weak.  You are not thinking clearly or you have trouble forming sentences. It may take a friend or family member to notice this.  You have chest pain, abdominal pain, shortness of breath, or sweating.  Your vision changes.  You have any bleeding.  You have a severe headache.  You have neck pain or a stiff neck.  You have a fever. These  symptoms may represent a serious problem that is an emergency. Do not wait to see if the symptoms will go away. Get medical help right away. Call your local emergency services (911 in the [place] Do not drive yourself to the hospital. Summary  Dizziness is a feeling of unsteadiness or light-headedness. This condition can be caused by a number of things, including medicines, dehydration, or illness.  Anyone can become dizzy, but dizziness is more common in older adults.  Drink enough fluid to keep your urine clear or pale yellow. Do not drink  alcohol.  Avoid making quick movements if you feel dizzy. Monitor your dizziness for any changes. This information is not intended to replace advice given to you by your health care provider. Make sure you discuss any questions you have with your health care provider. Document Released: 07/01/2000 Document Revised: 01/08/2017 Document Reviewed: 02/08/2016 Elsevier Patient Education  2020 [employer].

## 2018-12-31 LAB — COMPLETE METABOLIC PANEL WITH GFR
AG Ratio: 1.7 (calc) (ref 1.0–2.5)
ALT: 13 U/L (ref 6–29)
AST: 24 U/L (ref 10–30)
Albumin: 4.6 g/dL (ref 3.6–5.1)
Alkaline phosphatase (APISO): 39 U/L (ref 31–125)
BUN: 16 mg/dL (ref 7–25)
CO2: 25 mmol/L (ref 20–32)
Calcium: 8.8 mg/dL (ref 8.6–10.2)
Chloride: 105 mmol/L (ref 98–110)
Creat: 1.08 mg/dL (ref 0.50–1.10)
GFR, Est African American: 74 mL/min/{1.73_m2} (ref >=60)
GFR, Est Non African American: 64 mL/min/{1.73_m2} (ref >=60)
Globulin: 2.7 g/dL (calc) (ref 1.9–3.7)
Glucose, Bld: 89 mg/dL (ref 65–99)
Potassium: 3.8 mmol/L (ref 3.5–5.3)
Sodium: 141 mmol/L (ref 135–146)
Total Bilirubin: 0.3 mg/dL (ref 0.2–1.2)
Total Protein: 7.3 g/dL (ref 6.1–8.1)

## 2018-12-31 LAB — CBC WITH DIFFERENTIAL/PLATELET
Absolute Monocytes: 616 cells/uL (ref 200–950)
Basophils Absolute: 41 cells/uL (ref 0–200)
Basophils Relative: 0.5 %
Eosinophils Absolute: 73 cells/uL (ref 15–500)
Eosinophils Relative: 0.9 %
HCT: 34.8 % — ABNORMAL LOW (ref 35.0–45.0)
Hemoglobin: 11.2 g/dL — ABNORMAL LOW (ref 11.7–15.5)
Lymphs Abs: 3135 cells/uL (ref 850–3900)
MCH: 24.9 pg — ABNORMAL LOW (ref 27.0–33.0)
MCHC: 32.2 g/dL (ref 32.0–36.0)
MCV: 77.3 fL — ABNORMAL LOW (ref 80.0–100.0)
MPV: 10.1 fL (ref 7.5–12.5)
Monocytes Relative: 7.6 %
Neutro Abs: 4236 cells/uL (ref 1500–7800)
Neutrophils Relative %: 52.3 %
Platelets: 327 10*3/uL (ref 140–400)
RBC: 4.5 10*6/uL (ref 3.80–5.10)
RDW: 13.8 % (ref 11.0–15.0)
Total Lymphocyte: 38.7 %
WBC: 8.1 10*3/uL (ref 3.8–10.8)

## 2018-12-31 LAB — LIPID PANEL
Cholesterol: 144 mg/dL (ref <200)
HDL: 52 mg/dL (ref >=50)
LDL Cholesterol (Calc): 78 mg/dL (calc)
Non-HDL Cholesterol (Calc): 92 mg/dL (calc) (ref <130)
Total CHOL/HDL Ratio: 2.8 (calc) (ref <5.0)
Triglycerides: 56 mg/dL (ref <150)

## 2018-12-31 LAB — TSH: TSH: 3.88 mIU/L

## 2018-12-31 LAB — HEMOGLOBIN A1C
Hgb A1c MFr Bld: 6 % of total Hgb — ABNORMAL HIGH (ref <5.7)
Mean Plasma Glucose: 126 (calc)
eAG (mmol/L): 7 (calc)

## 2019-01-02 ENCOUNTER — Encounter: Payer: Self-pay | Admitting: Family Medicine

## 2019-01-03 ENCOUNTER — Other Ambulatory Visit: Payer: Self-pay | Admitting: Family Medicine

## 2019-01-03 DIAGNOSIS — Z9009 Acquired absence of other part of head and neck: Secondary | ICD-10-CM

## 2019-01-03 DIAGNOSIS — E890 Postprocedural hypothyroidism: Secondary | ICD-10-CM

## 2019-01-09 ENCOUNTER — Ambulatory Visit: Payer: 59 | Attending: Internal Medicine

## 2019-01-09 DIAGNOSIS — Z20822 Contact with and (suspected) exposure to covid-19: Secondary | ICD-10-CM

## 2019-01-10 LAB — NOVEL CORONAVIRUS, NAA: SARS-CoV-2, NAA: NOT DETECTED

## 2019-01-11 ENCOUNTER — Telehealth: Payer: 59 | Admitting: Family Medicine

## 2019-01-11 ENCOUNTER — Other Ambulatory Visit: Payer: Self-pay

## 2019-02-03 ENCOUNTER — Other Ambulatory Visit: Payer: Self-pay

## 2019-02-03 ENCOUNTER — Other Ambulatory Visit (HOSPITAL_COMMUNITY)
Admission: RE | Admit: 2019-02-03 | Discharge: 2019-02-03 | Disposition: A | Discharge status: Home / Self Care | Payer: 59 | Source: Ambulatory Visit | Attending: Family Medicine | Admitting: Family Medicine

## 2019-02-03 ENCOUNTER — Encounter: Payer: Self-pay | Admitting: Family Medicine

## 2019-02-03 ENCOUNTER — Ambulatory Visit (INDEPENDENT_AMBULATORY_CARE_PROVIDER_SITE_OTHER): Payer: 59 | Admitting: Family Medicine

## 2019-02-03 VITALS — BP 140/90 | HR 91 | Temp 96.6°F | Resp 16 | Ht 69.0 in | Wt 329.2 lb

## 2019-02-03 DIAGNOSIS — I10 Essential (primary) hypertension: Secondary | ICD-10-CM | POA: Diagnosis not present

## 2019-02-03 DIAGNOSIS — Z124 Encounter for screening for malignant neoplasm of cervix: Secondary | ICD-10-CM | POA: Diagnosis present

## 2019-02-03 DIAGNOSIS — D500 Iron deficiency anemia secondary to blood loss (chronic): Secondary | ICD-10-CM

## 2019-02-03 MED ORDER — FERROUS SULFATE 325 (65 FE) MG PO TBEC: 325.0000 mg | DELAYED_RELEASE_TABLET | Freq: Three times a day (TID) | ORAL | 1 refills | Status: DC

## 2019-02-03 MED ORDER — DOCUSATE SODIUM 100 MG PO CAPS: 100.0000 mg | ORAL_CAPSULE | Freq: Two times a day (BID) | ORAL | 0 refills | Status: DC

## 2019-02-03 NOTE — Progress Notes (Signed)
Name: Michelle Ware   MRN: [id number]    DOB: [birth year]   Date:02/03/2019       Progress Note  Subjective  Chief Complaint  Chief Complaint  Patient presents with  . Annual Exam    HPI  HTN: she did not take bp medication this am, bp is borderline today, she denies chest pain or palpitation   Anemia: used to have fibroid tumors , she states she continues to have regular cycles , she states cycles are not very heavy, but has to removed pads every 3 hours, she has to use the long pads with wings and is filled with blood. Discussed starting iron again with colace to prevent constipation and once ferritin goes up , continue taking it during cycles  Obesity: insurance denied medication for weight loss, and they don't cover bariatric surgery, she has tried a low carbohydrate diet. She states she stopped drinking sodas January 1st, she also decreased tea intake. She is 6 lbs heavier than one year ago. Discussed increased physical to 30 minutes 5 times a week.   She states did not come for CPE, wants to wait until the Summer, but would like to have a pap smear today, no symptoms of vaginal discharge or pain during sex, just skipped last year and is worried about it. Discussed USPTF   but she states she would like to have it done    Patient Active Problem List   Diagnosis Date Noted  . History of thyroidectomy 08/05/2017  . Post-surgical hypothyroidism 03/25/2017  . S/P cesarean section: Indication: hx. of myomectomy and CHTN  09/28/2016  . Tenosynovitis of wrist 05/21/2016  . Morbid obesity with BMI of 40.0-44.9, adult ([place]) 03/31/2016  . H/O myomectomy 03/11/2015  . Essential hypertension 11/28/2014  . Obesity, Class III, BMI 40-49.9 (morbid obesity) ([place]) 11/28/2014  . Decreased thyroid stimulating hormone (TSH) level 07/24/2014  . Hypertrichosis 07/24/2014  . Vitamin D deficiency 07/24/2014  . PCOS (polycystic ovarian syndrome) 07/24/2014  . History of abnormal cervical Pap smear  11/24/2011  . Genital herpes 11/29/2008    Past Surgical History:  Procedure Laterality Date  . BIOPSY THYROID  07/2015   Dr. Gabriel Carina  . CESAREAN SECTION N/A 09/28/2016   Procedure: Primary CESAREAN SECTION;  Surgeon: Servando Salina, MD;  Location: [place];  Service: Obstetrics;  Laterality: N/A;  EDD: 10/19/16 Allergy: Hydrocodone-Acetaminophen  . LAPAROSCOPIC GELPORT ASSISTED MYOMECTOMY N/A 03/11/2015   Procedure: LAPAROSCOPIC  MYOMECTOMY--attempted;  Surgeon: Rubie Maid, MD;  Location: [facility];  Service: Gynecology;  Laterality: N/A;  . LAPAROTOMY N/A 03/11/2015   Procedure: LAPAROTOMY--MYOMECTOMY;  Surgeon: Rubie Maid, MD;  Location: [facility];  Service: Gynecology;  Laterality: N/A;  . MOUTH SURGERY  1996  . TOTAL THYROIDECTOMY Bilateral 03/05/2017   Dr. Maudie [facility]    Family History  Problem Relation Age of Onset  . Thyroid disease Mother   . Hypertension Mother   . Cancer Maternal Aunt   . Cancer Maternal Grandfather   . Prostate cancer Maternal Grandfather   . Stroke Maternal Grandmother   . Breast cancer Paternal Grandmother   . Heart failure Paternal Grandfather   . Liver disease Brother        Fatty Liver     Current Outpatient Medications:  .  blood glucose meter kit and supplies, Dispense based on patient and insurance preference. Use up to four times daily as directed., Disp: 1 each, Rfl: 0 .  fluticasone (FLONASE) 50 MCG/ACT nasal spray, Place 2 sprays  into both nostrils as needed., Disp: 16 g, Rfl: 3 .  levocetirizine (XYZAL) 5 MG tablet, Take 1 tablet (5 mg total) by mouth every evening., Disp: 30 tablet, Rfl: 0 .  levothyroxine (SYNTHROID) 200 MCG tablet, TAKE 1 TABLET BY MOUTH DAILY. TAKE AN EXTRA HALF PILL TWICE A WEEK AND RECHECK IN 6 WEEKS, Disp: 34 tablet, Rfl: 0 .  triamterene-hydrochlorothiazide (DYAZIDE) 37.5-25 MG capsule, Take 1 each (1 capsule total) by mouth daily., Disp: 90 capsule, Rfl: 0 .  valACYclovir (VALTREX) 500 MG tablet,  Take 1 tablet (500 mg total) by mouth 2 (two) times daily. Once daily for prevention BID for outbreaks, Disp: 100 tablet, Rfl: 1 .  Vitamin D, Ergocalciferol, (DRISDOL) 1.25 MG (50000 UT) CAPS capsule, Take 1 capsule (50,000 Units total) by mouth every 7 (seven) days., Disp: 4 capsule, Rfl: 2  Allergies  Allergen Reactions  . Hydrocodone Itching    I personally reviewed active problem list, medication list, allergies, family history, social history, health maintenance with the patient/caregiver today.   ROS  Constitutional: Negative for fever or weight change.  Respiratory: Negative for cough and shortness of breath.   Cardiovascular: Negative for chest pain or palpitations.  Gastrointestinal: Negative for abdominal pain, no bowel changes.  Musculoskeletal: Negative for gait problem or joint swelling.  Skin: Negative for rash.  Neurological: Negative for dizziness or headache.  No other specific complaints in a complete review of systems (except as listed in HPI above).  Objective  Vitals:   02/03/19 0816  BP: 140/90  Pulse: 91  Resp: 16  Temp: (!) 96.6 F (35.9 C)  TempSrc: Temporal  SpO2: 96%  Weight: (!) 329 lb 3.2 oz (149.3 kg)  Height: '5\' 9"'  (1.753 m)    Body mass index is 48.61 kg/m.  Physical Exam  Constitutional: Patient appears well-developed and well-nourished. Obese  No distress.  HEENT: head atraumatic, normocephalic, pupils equal and reactive to light Cardiovascular: Normal rate, regular rhythm and normal heart sounds.  No murmur heard. No BLE edema. Pulmonary/Chest: Effort normal and breath sounds normal. No respiratory distress. Abdominal: Soft.  There is no tenderness. Pelvic: normal external genitalia, no masses, no bimanual motion tenderness , pap smear collected  Psychiatric: Patient has a normal mood and affect. behavior is normal. Judgment and thought content normal.  Recent Results (from the past 2160 hour(s))  Lipid panel     Status: None    Collection Time: 12/30/18 12:00 AM  Result Value Ref Range   Cholesterol 144 <200 mg/dL   HDL 52 > OR = 50 mg/dL   Triglycerides 56 <150 mg/dL   LDL Cholesterol (Calc) 78 mg/dL (calc)    Comment: Reference range: <100 . Desirable range <100 mg/dL for primary prevention;   <70 mg/dL for patients with CHD or diabetic patients  with > or = 2 CHD risk factors. Marland Kitchen LDL-C is now calculated using the Martin-Hopkins  calculation, which is a validated novel method providing  better accuracy than the Friedewald equation in the  estimation of LDL-C.  Cresenciano Genre et al. Annamaria Helling. 6945;038(88): 2061-2068  (http://education.[employer].com/faq/FAQ164)    Total CHOL/HDL Ratio 2.8 <5.0 (calc)   Non-HDL Cholesterol (Calc) 92 <130 mg/dL (calc)    Comment: For patients with diabetes plus 1 major ASCVD risk  factor, treating to a non-HDL-C goal of <100 mg/dL  (LDL-C of <70 mg/dL) is considered a therapeutic  option.   CBC with Differential/Platelet     Status: Abnormal   Collection Time: 12/30/18 12:00 AM  Result Value Ref Range   WBC 8.1 3.8 - 10.8 Thousand/uL   RBC 4.50 3.80 - 5.10 Million/uL   Hemoglobin 11.2 (L) 11.7 - 15.5 g/dL   HCT 34.8 (L) 35.0 - 45.0 %   MCV 77.3 (L) 80.0 - 100.0 fL   MCH 24.9 (L) 27.0 - 33.0 pg   MCHC 32.2 32.0 - 36.0 g/dL   RDW 13.8 11.0 - 15.0 %   Platelets 327 140 - 400 Thousand/uL   MPV 10.1 7.5 - 12.5 fL   Neutro Abs 4,236 1,500 - 7,800 cells/uL   Lymphs Abs 3,135 850 - 3,900 cells/uL   Absolute Monocytes 616 200 - 950 cells/uL   Eosinophils Absolute 73 15 - 500 cells/uL   Basophils Absolute 41 0 - 200 cells/uL   Neutrophils Relative % 52.3 %   Total Lymphocyte 38.7 %   Monocytes Relative 7.6 %   Eosinophils Relative 0.9 %   Basophils Relative 0.5 %  COMPLETE METABOLIC PANEL WITH GFR     Status: None   Collection Time: 12/30/18 12:00 AM  Result Value Ref Range   Glucose, Bld 89 65 - 99 mg/dL    Comment: .            Fasting reference interval .    BUN  16 7 - 25 mg/dL   Creat 1.08 0.50 - 1.10 mg/dL   GFR, Est Non African American 64 > OR = 60 mL/min/1.77m   GFR, Est African American 74 > OR = 60 mL/min/1.774m  BUN/Creatinine Ratio NOT APPLICABLE 6 - 22 (calc)   Sodium 141 135 - 146 mmol/L   Potassium 3.8 3.5 - 5.3 mmol/L   Chloride 105 98 - 110 mmol/L   CO2 25 20 - 32 mmol/L   Calcium 8.8 8.6 - 10.2 mg/dL   Total Protein 7.3 6.1 - 8.1 g/dL   Albumin 4.6 3.6 - 5.1 g/dL   Globulin 2.7 1.9 - 3.7 g/dL (calc)   AG Ratio 1.7 1.0 - 2.5 (calc)   Total Bilirubin 0.3 0.2 - 1.2 mg/dL   Alkaline phosphatase (APISO) 39 31 - 125 U/L   AST 24 10 - 30 U/L   ALT 13 6 - 29 U/L  TSH     Status: None   Collection Time: 12/30/18 12:00 AM  Result Value Ref Range   TSH 3.88 mIU/L    Comment:           Reference Range .           > or = 20 Years  0.40-4.50 .                Pregnancy Ranges           First trimester    0.26-2.66           Second trimester   0.55-2.73           Third trimester    0.43-2.91   Hemoglobin A1c     Status: Abnormal   Collection Time: 12/30/18 12:00 AM  Result Value Ref Range   Hgb A1c MFr Bld 6.0 (H) <5.7 % of total Hgb    Comment: For someone without known diabetes, a hemoglobin  A1c value between 5.7% and 6.4% is consistent with prediabetes and should be confirmed with a  follow-up test. . For someone with known diabetes, a value <7% indicates that their diabetes is well controlled. A1c targets should be individualized based on duration of diabetes, age, comorbid conditions, and  other considerations. . This assay result is consistent with an increased risk of diabetes. . Currently, no consensus exists regarding use of hemoglobin A1c for diagnosis of diabetes for children. .    Mean Plasma Glucose 126 (calc)   eAG (mmol/L) 7.0 (calc)  Novel Coronavirus, NAA ([facility])     Status: None   Collection Time: 01/09/19  8:45 AM   Specimen: Nasopharyngeal(NP) swabs in vial transport medium   NASOPHARYNGE   TESTING  Result Value Ref Range   SARS-CoV-2, NAA Not Detected Not Detected    Comment: This nucleic acid amplification test was developed and its performance characteristics determined by [employer]. Nucleic acid amplification tests include PCR and TMA. This test has not been FDA cleared or approved. This test has been authorized by FDA under an Emergency Use Authorization (EUA). This test is only authorized for the duration of time the declaration that circumstances exist justifying the authorization of the emergency use of in vitro diagnostic tests for detection of SARS-CoV-2 virus and/or diagnosis of COVID-19 infection under section 564(b)(1) of the Act, 21 U.S.C. 326ZTI-4(P) (1), unless the authorization is terminated or revoked sooner. When diagnostic testing is negative, the possibility of a false negative result should be considered in the context of a patient's recent exposures and the presence of clinical signs and symptoms consistent with COVID-19. An individual without symptoms of COVID-19 and who is not shedding SARS-CoV-2 virus would  expect to have a negative (not detected) result in this assay.      PHQ2/9: Depression screen [facility] 2/9 02/03/2019 12/30/2018 11/02/2018 10/20/2018 08/02/2018  Decreased Interest 0 0 0 0 0  Down, Depressed, Hopeless 0 0 0 0 0  PHQ - 2 Score 0 0 0 0 0  Altered sleeping 0 0 0 0 0  Tired, decreased energy 0 0 0 1 0  Change in appetite 0 0 0 0 2  Feeling bad or failure about yourself  0 0 0 0 0  Trouble concentrating 0 0 0 0 0  Moving slowly or fidgety/restless 0 0 0 0 0  Suicidal thoughts 0 0 - 0 0  PHQ-9 Score 0 0 0 1 2  Difficult doing work/chores - - - Not difficult at all Not difficult at all  Some recent data might be hidden    phq 9 is negative   Fall Risk: Fall Risk  02/03/2019 12/30/2018 11/02/2018 10/20/2018 07/01/2018  Falls in the past year? 0 0 0 0 0  Number falls in past yr: 0 0 0 0 0  Injury with Fall? 0 0 0 0 0   Follow up - - - - Falls evaluation completed     Functional Status Survey: Is the patient deaf or have difficulty hearing?: No Does the patient have difficulty seeing, even when wearing glasses/contacts?: No Does the patient have difficulty concentrating, remembering, or making decisions?: No Does the patient have difficulty walking or climbing stairs?: No Does the patient have difficulty dressing or bathing?: No Does the patient have difficulty doing errands alone such as visiting a doctor's office or shopping?: No   Assessment & Plan  1. Iron deficiency anemia due to chronic blood loss  - ferrous sulfate 325 (65 FE) MG EC tablet; Take 1 tablet (325 mg total) by mouth 3 (three) times daily with meals.  Dispense: 270 tablet; Refill: 1 - docusate sodium (COLACE) 100 MG capsule; Take 1 capsule (100 mg total) by mouth 2 (two) times daily.  Dispense: 100 capsule; Refill: 0  2.  Cervical cancer screening  - Cytology - PAP  3. Essential hypertension   4. Obesity, Class III, BMI 40-49.9 (morbid obesity) ([place])  Discussed with the patient the risk posed by an increased BMI. Discussed importance of portion control, calorie counting and at least 150 minutes of physical activity weekly. Avoid sweet beverages and drink more water. Eat at least 6 servings of fruit and vegetables daily

## 2019-02-10 ENCOUNTER — Encounter: Payer: Self-pay | Admitting: Family Medicine

## 2019-02-10 LAB — CYTOLOGY - PAP
Adequacy: ABNORMAL
Comment: NEGATIVE
High risk HPV: NEGATIVE

## 2019-02-13 NOTE — Telephone Encounter (Signed)
-----   Message from [place], [place] sent at 02/13/2019  1:40 PM EST ----- Patient notified and can come back on February 2 at 3:30 p.m. for just the Pap

## 2019-02-13 NOTE — Telephone Encounter (Signed)
Pt called and notified

## 2019-02-15 ENCOUNTER — Other Ambulatory Visit: Payer: Self-pay | Admitting: Family Medicine

## 2019-02-15 DIAGNOSIS — E890 Postprocedural hypothyroidism: Secondary | ICD-10-CM

## 2019-02-15 DIAGNOSIS — Z9009 Acquired absence of other part of head and neck: Secondary | ICD-10-CM

## 2019-02-15 NOTE — Telephone Encounter (Signed)
Requested medication (s) are due for refill today:  Yes  Requested medication (s) are on the active medication list:   Yes  Future visit scheduled:   Yes in 6 days with Dr. Ancil Boozer   She is for a recheck of her thyroid.   Last ordered: 01/03/2019  #34  0 refills.   Requested Prescriptions  Pending Prescriptions Disp Refills   levothyroxine (SYNTHROID) 200 MCG tablet [Pharmacy Med Name: LEVOTHYROXINE 200 MCG TABLET] 34 tablet 0    Sig: TAKE 1 TABLET BY MOUTH DAILY. TAKE AN EXTRA HALF PILL TWICE A WEEK AND RECHECK IN 74 WEEKS      Endocrinology:  Hypothyroid Agents Failed - 02/15/2019 11:36 AM      Failed - TSH needs to be rechecked within 3 months after an abnormal result. Refill until TSH is due.      Passed - TSH in normal range and within 360 days    TSH  Date Value Ref Range Status  12/30/2018 3.88 mIU/L Final    Comment:              Reference Range .           > or = 20 Years  0.40-4.50 .                Pregnancy Ranges           First trimester    0.26-2.66           Second trimester   0.55-2.73           Third trimester    0.43-2.91           Passed - Valid encounter within last 12 months    Recent Outpatient Visits           1 week ago Iron deficiency anemia due to chronic blood loss   [facility] [name], MD   1 month ago [facility] [name], MD   3 months ago Metabolic syndrome   [facility] [name], MD   3 months ago Sinus congestion   [place], [name]   6 months ago Essential hypertension   [facility] [name], MD       Future Appointments             In 6 days [name], MD [facility] [name]   In 5 months [name], MD [name]

## 2019-02-21 ENCOUNTER — Other Ambulatory Visit: Payer: Self-pay

## 2019-02-21 ENCOUNTER — Encounter: Payer: Self-pay | Admitting: Family Medicine

## 2019-02-21 ENCOUNTER — Other Ambulatory Visit (HOSPITAL_COMMUNITY)
Admission: RE | Admit: 2019-02-21 | Discharge: 2019-02-21 | Disposition: A | Discharge status: Home / Self Care | Payer: 59 | Source: Ambulatory Visit | Attending: Family Medicine | Admitting: Family Medicine

## 2019-02-21 ENCOUNTER — Ambulatory Visit (INDEPENDENT_AMBULATORY_CARE_PROVIDER_SITE_OTHER): Payer: 59 | Admitting: Family Medicine

## 2019-02-21 DIAGNOSIS — Z124 Encounter for screening for malignant neoplasm of cervix: Secondary | ICD-10-CM | POA: Diagnosis not present

## 2019-02-21 NOTE — Progress Notes (Signed)
Pap smear collected today - not enough cells on the previous pap done on 02/13/2019 No charge

## 2019-02-23 LAB — CYTOLOGY - PAP: Diagnosis: NEGATIVE

## 2019-04-06 ENCOUNTER — Other Ambulatory Visit: Payer: Self-pay | Admitting: Family Medicine

## 2019-04-06 DIAGNOSIS — I10 Essential (primary) hypertension: Secondary | ICD-10-CM

## 2019-04-16 ENCOUNTER — Ambulatory Visit: Payer: 59

## 2019-04-26 ENCOUNTER — Other Ambulatory Visit: Payer: Self-pay

## 2019-04-26 DIAGNOSIS — I10 Essential (primary) hypertension: Secondary | ICD-10-CM

## 2019-04-26 MED ORDER — TRIAMTERENE-HCTZ 37.5-25 MG PO CAPS: 1.0000 | ORAL_CAPSULE | Freq: Every day | ORAL | 0 refills | Status: DC

## 2019-05-02 ENCOUNTER — Encounter: Payer: Self-pay | Admitting: Family Medicine

## 2019-07-01 ENCOUNTER — Encounter: Payer: Self-pay | Admitting: Family Medicine

## 2019-07-21 ENCOUNTER — Other Ambulatory Visit: Payer: Self-pay

## 2019-07-21 ENCOUNTER — Encounter: Payer: Self-pay | Admitting: Family Medicine

## 2019-07-21 ENCOUNTER — Ambulatory Visit (INDEPENDENT_AMBULATORY_CARE_PROVIDER_SITE_OTHER): Payer: 59 | Admitting: Family Medicine

## 2019-07-21 VITALS — BP 130/84 | HR 83 | Temp 96.9°F | Resp 16 | Ht 69.0 in | Wt 326.3 lb

## 2019-07-21 DIAGNOSIS — Z0000 Encounter for general adult medical examination without abnormal findings: Secondary | ICD-10-CM | POA: Diagnosis not present

## 2019-07-21 DIAGNOSIS — I10 Essential (primary) hypertension: Secondary | ICD-10-CM | POA: Diagnosis not present

## 2019-07-21 DIAGNOSIS — D500 Iron deficiency anemia secondary to blood loss (chronic): Secondary | ICD-10-CM | POA: Diagnosis not present

## 2019-07-21 DIAGNOSIS — Z13220 Encounter for screening for lipoid disorders: Secondary | ICD-10-CM

## 2019-07-21 DIAGNOSIS — K219 Gastro-esophageal reflux disease without esophagitis: Secondary | ICD-10-CM

## 2019-07-21 DIAGNOSIS — E890 Postprocedural hypothyroidism: Secondary | ICD-10-CM

## 2019-07-21 DIAGNOSIS — E559 Vitamin D deficiency, unspecified: Secondary | ICD-10-CM

## 2019-07-21 DIAGNOSIS — E8881 Metabolic syndrome: Secondary | ICD-10-CM

## 2019-07-21 DIAGNOSIS — Z1231 Encounter for screening mammogram for malignant neoplasm of breast: Secondary | ICD-10-CM

## 2019-07-21 NOTE — Patient Instructions (Signed)

## 2019-07-21 NOTE — Progress Notes (Signed)
Name: Michelle Ware   MRN: [id number]    DOB: 31-Aug-1980   Date:07/21/2019       Progress Note  Subjective  Chief Complaint  Chief Complaint  Patient presents with  . Annual Exam    HPI  Patient presents for annual CPE.  HTN: bp is at goal today,  she denies chest pain, she has occasional palpitation   Anemia: used to have one large fibroid tumor, she states she continues to have regular cycles , she states cycles were not as heavy but since delivery back in 09/2016 cycles are heavy,  She has to change pads frequently, lasts 3-4 days. Discussed starting iron again with colace to prevent constipation  Obesity: insurance denied medication for weight loss, and they don't cover bariatric surgery, she has tried a low carbohydrate diet. She states she stopped drinking sodas January 1st, but is drinking again, weight is stable. Advised importance of physical activity   GERD: she states she changed her diet , packing her lunch and not having indigestion. Not taking medications for it.    Post-surgical hypothyroidism: she states she is compliant with medication, she has noticed some fatigue, and mild hair loss, no constipation , very seldom has palpitation    Diet: packing her lunch, eating smaller portion  Exercise: discussed 150 minutes per week.   USPSTF grade A and B recommendations    Office Visit from 02/03/2019 in [facility]  AUDIT-C Score 0     Depression: Phq 9 is  negative Depression screen [facility] 2/9 07/21/2019 02/03/2019 12/30/2018 11/02/2018 10/20/2018  Decreased Interest 0 0 0 0 0  Down, Depressed, Hopeless 0 0 0 0 0  PHQ - 2 Score 0 0 0 0 0  Altered sleeping 0 0 0 0 0  Tired, decreased energy 0 0 0 0 1  Change in appetite 0 0 0 0 0  Feeling bad or failure about yourself  0 0 0 0 0  Trouble concentrating 0 0 0 0 0  Moving slowly or fidgety/restless 0 0 0 0 0  Suicidal thoughts 0 0 0 - 0  PHQ-9 Score 0 0 0 0 1  Difficult doing work/chores - - - - Not  difficult at all  Some recent data might be hidden   Hypertension: BP Readings from Last 3 Encounters:  07/21/19 130/84  02/03/19 140/90  12/30/18 130/60   Obesity: Wt Readings from Last 3 Encounters:  07/21/19 (!) 326 lb 4.8 oz (148 kg)  02/03/19 (!) 329 lb 3.2 oz (149.3 kg)  12/30/18 (!) 325 lb 8 oz (147.6 kg)   BMI Readings from Last 3 Encounters:  07/21/19 48.19 kg/m  02/03/19 48.61 kg/m  12/30/18 48.07 kg/m     Hep C Screening: done 12/27/2017  STD testing and prevention (HIV/chl/gon/syphilis): not interested  Intimate partner violence: negative screen  Sexual History (Partners/Practices/Protection from [employer] hx STI/Pregnancy Plans): not sexually active at least since her last pap smear  Pain during Intercourse: usually no pain  Menstrual History/LMP/Abnormal Bleeding: heavy lasts 3 days  Incontinence Symptoms: she has episodes of urgency   Breast cancer:  - Last Mammogram: 08/25/2018 - BRCA gene screening: paternal grandmother had breast cancer   Osteoporosis: Discussed high calcium and vitamin D supplementation, weight bearing exercises  Cervical cancer screening: 0/02/2019   Skin cancer: Discussed monitoring for atypical lesions  Colorectal cancer: start at 40  Lung cancer:   Low Dose CT Chest recommended if Age 51-80 years, 30 pack-year currently smoking OR have  quit w/in 15years. Patient does not qualify.   ECG: 2018   Advanced Care Planning: A voluntary discussion about advance care planning including the explanation and discussion of advance directives.  Discussed health care proxy and Living will, and the patient was able to identify a health care proxy as parents .  Patient does not know have a living will at present time. Lipids: Lab Results  Component Value Date   CHOL 144 12/30/2018   CHOL 136 12/27/2017   CHOL 158 12/22/2016   Lab Results  Component Value Date   HDL 52 12/30/2018   HDL 51 12/27/2017   HDL 66 12/22/2016   Lab Results   Component Value Date   LDLCALC 78 12/30/2018   LDLCALC 70 12/27/2017   LDLCALC 79 12/22/2016   Lab Results  Component Value Date   TRIG 56 12/30/2018   TRIG 70 12/27/2017   TRIG 53 12/22/2016   Lab Results  Component Value Date   CHOLHDL 2.8 12/30/2018   CHOLHDL 2.7 12/27/2017   CHOLHDL 2.4 12/22/2016   No results found for: LDLDIRECT  Glucose: Glucose, Bld  Date Value Ref Range Status  12/30/2018 89 65 - 99 mg/dL Final    Comment:    .            Fasting reference interval .   07/01/2018 86 65 - 99 mg/dL Final    Comment:    .            Fasting reference interval .   12/27/2017 83 65 - 99 mg/dL Final    Comment:    .            Fasting reference interval .     Patient Active Problem List   Diagnosis Date Noted  . History of thyroidectomy 08/05/2017  . Post-surgical hypothyroidism 03/25/2017  . S/P cesarean section: Indication: hx. of myomectomy and CHTN  09/28/2016  . Tenosynovitis of wrist 05/21/2016  . Morbid obesity with BMI of 40.0-44.9, adult ([place]) 03/31/2016  . H/O myomectomy 03/11/2015  . Essential hypertension 11/28/2014  . Obesity, Class III, BMI 40-49.9 (morbid obesity) ([place]) 11/28/2014  . Decreased thyroid stimulating hormone (TSH) level 07/24/2014  . Hypertrichosis 07/24/2014  . Vitamin D deficiency 07/24/2014  . PCOS (polycystic ovarian syndrome) 07/24/2014  . History of abnormal cervical Pap smear 11/24/2011  . Genital herpes 11/29/2008    Past Surgical History:  Procedure Laterality Date  . BIOPSY THYROID  07/2015   Dr. Gabriel Carina  . CESAREAN SECTION N/A 09/28/2016   Procedure: Primary CESAREAN SECTION;  Surgeon: Servando Salina, MD;  Location: [place];  Service: Obstetrics;  Laterality: N/A;  EDD: 10/19/16 Allergy: Hydrocodone-Acetaminophen  . LAPAROSCOPIC [place] ASSISTED MYOMECTOMY N/A 03/11/2015   Procedure: LAPAROSCOPIC  MYOMECTOMY--attempted;  Surgeon: Rubie Maid, MD;  Location: [facility];  Service: Gynecology;   Laterality: N/A;  . LAPAROTOMY N/A 03/11/2015   Procedure: LAPAROTOMY--MYOMECTOMY;  Surgeon: Rubie Maid, MD;  Location: [facility];  Service: Gynecology;  Laterality: N/A;  . MOUTH SURGERY  1996  . TOTAL THYROIDECTOMY Bilateral 03/05/2017   Dr. Maudie Mercury [facility]    Family History  Problem Relation Age of Onset  . Thyroid disease Mother   . Hypertension Mother   . Cancer Maternal Aunt   . Cancer Maternal Grandfather   . Prostate cancer Maternal Grandfather   . Stroke Maternal Grandmother   . Breast cancer Paternal Grandmother   . Heart failure Paternal Grandfather   . Liver disease Brother  Fatty Liver    Social History   Socioeconomic History  . Marital status: Single    Spouse name: Not on file  . Number of children: 1  . Years of education: Not on file  . Highest education level: Bachelor's degree (e.g., BA, AB, BS)  Occupational History  . Occupation: Glass blower/designer    Comment: works for [employer] in [employer]  . Smoking status: Never Smoker  . Smokeless tobacco: Never Used  Vaping Use  . Vaping Use: Never used  Substance and Sexual Activity  . Alcohol use: Yes    Comment: occasionally, about once a month  . Drug use: No  . Sexual activity: Not Currently    Partners: Male    Birth control/protection: Condom  Other Topics Concern  . 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   Social Determinants of Health   Financial Resource Strain: Low Risk   . Difficulty of Paying Living Expenses: Not hard at all  Food Insecurity: No Food Insecurity  . Worried About Charity fundraiser in the Last Year: Never true  . Ran Out of Food in the Last Year: Never true  Transportation Needs: No Transportation Needs  . Lack of Transportation (Medical): No  . Lack of Transportation  (Non-Medical): No  Physical Activity: Inactive  . Days of Exercise per Week: 0 days  . Minutes of Exercise per Session: 0 min  Stress: No Stress Concern Present  . Feeling of Stress : Not at all  Social Connections: Moderately Integrated  . Frequency of Communication with Friends and Family: More than three times a week  . Frequency of Social Gatherings with Friends and Family: Twice a week  . Attends Religious Services: More than 4 times per year  . Active Member of Clubs or Organizations: Yes  . Attends Archivist Meetings: Never  . Marital Status: Never married  Intimate Partner Violence: Not At Risk  . Fear of Current or Ex-Partner: No  . Emotionally Abused: No  . Physically Abused: No  . Sexually Abused: No     Current Outpatient Medications:  .  blood glucose meter kit and supplies, Dispense based on patient and insurance preference. Use up to four times daily as directed., Disp: 1 each, Rfl: 0 .  ferrous sulfate 325 (65 FE) MG EC tablet, Take 1 tablet (325 mg total) by mouth 3 (three) times daily with meals., Disp: 270 tablet, Rfl: 1 .  levocetirizine (XYZAL) 5 MG tablet, Take 1 tablet (5 mg total) by mouth every evening., Disp: 30 tablet, Rfl: 0 .  levothyroxine (SYNTHROID) 200 MCG tablet, TAKE 1 TABLET BY MOUTH DAILY. TAKE AN EXTRA HALF PILL TWICE A WEEK AND RECHECK IN 6 WEEKS, Disp: 132 tablet, Rfl: 0 .  triamterene-hydrochlorothiazide (DYAZIDE) 37.5-25 MG capsule, Take 1 each (1 capsule total) by mouth daily., Disp: 90 capsule, Rfl: 0 .  valACYclovir (VALTREX) 500 MG tablet, Take 1 tablet (500 mg total) by mouth 2 (two) times daily. Once daily for prevention BID for outbreaks, Disp: 100 tablet, Rfl: 1 .  Vitamin D, Ergocalciferol, (DRISDOL) 1.25 MG (50000 UT) CAPS capsule, Take 1 capsule (50,000 Units total) by mouth every 7 (seven) days., Disp: 4 capsule, Rfl: 2 .  docusate sodium (COLACE) 100  MG capsule, Take 1 capsule (100 mg total) by mouth 2 (two) times daily.  (Patient not taking: Reported on 07/21/2019), Disp: 100 capsule, Rfl: 0 .  fluticasone (FLONASE) 50 MCG/ACT nasal spray, Place 2 sprays into both nostrils as needed. (Patient not taking: Reported on 07/21/2019), Disp: 16 g, Rfl: 3  Allergies  Allergen Reactions  . Hydrocodone Itching     ROS  Constitutional: Negative for fever or weight change.  Respiratory: Negative for cough and shortness of breath.   Cardiovascular: Negative for chest pain or palpitations.  Gastrointestinal: Negative for abdominal pain, no bowel changes.  Musculoskeletal: Negative for gait problem or joint swelling.  Skin: Negative for rash.  Neurological: Negative for dizziness or headache.  No other specific complaints in a complete review of systems (except as listed in HPI above).  Objective  Vitals:   07/21/19 0802  BP: 130/84  Pulse: 83  Resp: 16  Temp: (!) 96.9 F (36.1 C)  TempSrc: Temporal  SpO2: 98%  Weight: (!) 326 lb 4.8 oz (148 kg)  Height: '5\' 9"'  (1.753 m)    Body mass index is 48.19 kg/m.  Physical Exam  Constitutional: Patient appears well-developed and well-nourished. No distress.  HENT: Head: Normocephalic and atraumatic. Ears: B TMs ok, no erythema or effusion; Nose: Not done Mouth/Throat: not done Eyes: Conjunctivae and EOM are normal. Pupils are equal, round, and reactive to light. No scleral icterus.  Neck: Normal range of motion. Neck supple. No JVD present. No thyromegaly present.  Cardiovascular: Normal rate, regular rhythm and normal heart sounds.  No murmur heard. No BLE edema. Pulmonary/Chest: Effort normal and breath sounds normal. No respiratory distress. Abdominal: Soft. Bowel sounds are normal, no distension. There is no tenderness. no masses Breast: no lumps or masses, no nipple discharge or rashes FEMALE GENITALIA:  Not done RECTAL: not done Musculoskeletal: Normal range of motion, no joint effusions. No gross deformities Neurological: he is alert and oriented to  person, place, and time. No cranial nerve deficit. Coordination, balance, strength, speech and gait are normal.  Skin: Skin is warm and dry. No rash noted. No erythema. Hypertrichosis  Psychiatric: Patient has a normal mood and affect. behavior is normal. Judgment and thought content normal.  Fall Risk: Fall Risk  07/21/2019 02/03/2019 12/30/2018 11/02/2018 10/20/2018  Falls in the past year? 0 0 0 0 0  Number falls in past yr: 0 0 0 0 0  Injury with Fall? 0 0 0 0 0  Follow up - - - - -     Functional Status Survey: Is the patient deaf or have difficulty hearing?: No Does the patient have difficulty seeing, even when wearing glasses/contacts?: No Does the patient have difficulty concentrating, remembering, or making decisions?: No Does the patient have difficulty walking or climbing stairs?: No Does the patient have difficulty dressing or bathing?: No Does the patient have difficulty doing errands alone such as visiting a doctor's office or shopping?: No   Assessment & Plan  1. Well adult exam   2. Obesity, Class III, BMI 40-49.9 (morbid obesity) ([place])  Discussed with the patient the risk posed by an increased BMI. Discussed importance of portion control, calorie counting and at least 150 minutes of physical activity weekly. Avoid sweet beverages and drink more water. Eat at least 6 servings of fruit and vegetables daily   3. Iron deficiency anemia due to chronic blood loss  Recheck CBC  4. Essential hypertension  - COMPLETE METABOLIC PANEL WITH GFR - CBC with Differential/Platelet  5. Metabolic syndrome  - Hemoglobin A1c  6. Lipid screening  - Lipid panel  7. Vitamin D deficiency  - VITAMIN D 25 Hydroxy (Vit-D Deficiency, Fractures)  8. Post-surgical hypothyroidism  - TSH   -USPSTF grade A and B recommendations reviewed with patient; age-appropriate recommendations, preventive care, screening tests, etc discussed and encouraged; healthy living encouraged; see AVS  for patient education given to patient -Discussed importance of 150 minutes of physical activity weekly, eat two servings of fish weekly, eat one serving of tree nuts ( cashews, pistachios, pecans, almonds.Marland Kitchen) every other day, eat 6 servings of fruit/vegetables daily and drink plenty of water and avoid sweet beverages.

## 2019-07-31 ENCOUNTER — Other Ambulatory Visit: Payer: Self-pay | Admitting: Family Medicine

## 2019-07-31 DIAGNOSIS — E890 Postprocedural hypothyroidism: Secondary | ICD-10-CM

## 2019-08-19 ENCOUNTER — Other Ambulatory Visit: Payer: Self-pay | Admitting: Family Medicine

## 2019-08-19 DIAGNOSIS — I10 Essential (primary) hypertension: Secondary | ICD-10-CM

## 2019-08-19 NOTE — Telephone Encounter (Signed)
Requested Prescriptions  Pending Prescriptions Disp Refills  . triamterene-hydrochlorothiazide (DYAZIDE) 37.5-25 MG capsule [Pharmacy Med Name: TRIAMTERENE-HCTZ 37.5-25 MG CP] 90 capsule 1    Sig: TAKE 1 EACH (1 CAPSULE TOTAL) BY MOUTH DAILY.     Cardiovascular: Diuretic Combos Passed - 08/19/2019  8:46 AM      Passed - K in normal range and within 360 days    Potassium  Date Value Ref Range Status  12/30/2018 3.8 3.5 - 5.3 mmol/L Final         Passed - Na in normal range and within 360 days    Sodium  Date Value Ref Range Status  12/30/2018 141 135 - 146 mmol/L Final  04/07/2016 136 134 - 144 mmol/L Final         Passed - Cr in normal range and within 360 days    Creat  Date Value Ref Range Status  12/30/2018 1.08 0.50 - 1.10 mg/dL Final         Passed - Ca in normal range and within 360 days    Calcium  Date Value Ref Range Status  12/30/2018 8.8 8.6 - 10.2 mg/dL Final         Passed - Last BP in normal range    BP Readings from Last 1 Encounters:  07/21/19 130/84         Passed - Valid encounter within last 6 months    Recent Outpatient Visits          4 weeks ago Obesity, Class III, BMI 40-49.9 (morbid obesity) [facility])   [facility] [name], MD   5 months ago Cervical cancer screening   [facility], Drue Stager, MD   6 months ago Iron deficiency anemia due to chronic blood loss   [facility] [name], MD   7 months ago [facility] [name], MD   9 months ago Metabolic syndrome   [facility] [name], MD      Future Appointments            In 5 months Ancil Boozer, Drue Stager, MD [facility], [facility]

## 2019-08-20 ENCOUNTER — Other Ambulatory Visit: Payer: Self-pay | Admitting: Family Medicine

## 2019-08-20 DIAGNOSIS — A6000 Herpesviral infection of urogenital system, unspecified: Secondary | ICD-10-CM

## 2019-08-20 NOTE — Telephone Encounter (Signed)
Requested Prescriptions  Pending Prescriptions Disp Refills  . valACYclovir (VALTREX) 500 MG tablet [Pharmacy Med Name: VALACYCLOVIR HCL 500 MG TABLET] 60 tablet 3    Sig: TAKE 1 TABLET BY MOUTH 2 TIMES DAILY FOR OUTBREAKS AND ONCE DAILY FOR PREVENTION AS DIRECTED     Antimicrobials:  Antiviral Agents - Anti-Herpetic Passed - 08/20/2019  2:10 PM      Passed - Valid encounter within last 12 months    Recent Outpatient Visits          1 month ago Obesity, Class III, BMI 40-49.9 (morbid obesity) [facility])   [facility] [name], MD   6 months ago Cervical cancer screening   [facility], Drue Stager, MD   6 months ago Iron deficiency anemia due to chronic blood loss   [facility] [name], MD   7 months ago [facility] [name], MD   9 months ago Metabolic syndrome   [facility] [name], MD      Future Appointments            In 5 months Ancil Boozer, Drue Stager, MD [facility], [facility]

## 2019-09-12 ENCOUNTER — Other Ambulatory Visit: Payer: Self-pay

## 2019-09-12 ENCOUNTER — Ambulatory Visit
Admission: RE | Admit: 2019-09-12 | Discharge: 2019-09-12 | Disposition: A | Discharge status: Home / Self Care | Payer: 59 | Source: Ambulatory Visit | Attending: Family Medicine | Admitting: Family Medicine

## 2019-09-12 DIAGNOSIS — Z1231 Encounter for screening mammogram for malignant neoplasm of breast: Secondary | ICD-10-CM | POA: Insufficient documentation

## 2019-10-09 ENCOUNTER — Encounter: Payer: Self-pay | Admitting: Family Medicine

## 2019-12-22 ENCOUNTER — Ambulatory Visit: Payer: Self-pay

## 2019-12-22 ENCOUNTER — Ambulatory Visit: Payer: 59 | Admitting: Internal Medicine

## 2019-12-22 ENCOUNTER — Other Ambulatory Visit: Payer: Self-pay

## 2019-12-22 ENCOUNTER — Encounter: Payer: Self-pay | Admitting: Internal Medicine

## 2019-12-22 VITALS — BP 140/70 | HR 95 | Temp 97.4°F | Resp 16 | Ht 70.0 in | Wt 332.6 lb

## 2019-12-22 DIAGNOSIS — Z23 Encounter for immunization: Secondary | ICD-10-CM

## 2019-12-22 DIAGNOSIS — R5383 Other fatigue: Secondary | ICD-10-CM | POA: Diagnosis not present

## 2019-12-22 DIAGNOSIS — E890 Postprocedural hypothyroidism: Secondary | ICD-10-CM

## 2019-12-22 DIAGNOSIS — D500 Iron deficiency anemia secondary to blood loss (chronic): Secondary | ICD-10-CM

## 2019-12-22 DIAGNOSIS — R42 Dizziness and giddiness: Secondary | ICD-10-CM

## 2019-12-22 DIAGNOSIS — N920 Excessive and frequent menstruation with regular cycle: Secondary | ICD-10-CM

## 2019-12-22 NOTE — Patient Instructions (Signed)
Please try to stay well-hydrated  Await results of the lab tests completed today

## 2019-12-22 NOTE — Progress Notes (Addendum)
Patient ID: Michelle Ware, female    DOB: Aug 24, 1980, 39 y.o.   MRN: [id number]  PCP: [name], MD  Chief Complaint  Patient presents with  . Dizziness    Subjective:   Michelle Ware is a 39 y.o. female, presents to clinic with CC of the following:  Chief Complaint  Patient presents with  . Dizziness    HPI:  Patient is a 39 year old female patient of Dr. Ancil Boozer Last visit with her 07/21/2019 She has a follow-up appointment scheduled 01/26/2020. Labs were ordered at that visit, although never obtained. She called this morning with the above complaint.  Patient called stating that she feels lightheaded. She states that it is mild and does not interfere with her activity.  She says she is a little tired as well.  She takes thyroid medication and BP medication.  She has not been able check her BP but her watch measures her HR at 88bpm. She denies other symptoms. No sinus symptoms . She has never had any vertigo. She has never been lightheaded. Appointment scheduled per protocol. Care advice read to patient. Follows up. She noted that starting Wednesday, he felt "fuzzy "intermittently felt "woozy ".  Last night, she did not feel particularly well, felt a little dizzy, denied marked room spinning sensation, although almost felt like she was spinning, and laid down to rest.  Today, he started to feel a little lightheaded again as midday approached, denied any dizzy feelings, and also felt more fatigued today.  She states she has been feeling little more fatigue in the recent past.  No CP's, palpitations, SOB, N/V, joint aches or swelling, bad headaches Also denies any recent infectious symptoms with no fevers, chills, no loss of taste or smell,  swollen glands, cough, PND No history of diabetes.  She does have a history of low iron in the past, and noted today it is intermittently low, sometimes is good.  She also has a history of a thyroidectomy, and has been on Synthroid since.  She  missed 1 dose of this medicine on Sunday, otherwise denied missing doses of this in the recent past. She notes her periods have been very heavy recently, with her most recent period ending last week.  She notes a couple months prior they have been heavy as well. She states there is no chance she is pregnant. Denies any dark or black stools or bleeding per rectum.  Denies any abdominal pains or, although did note she had some indigestion yesterday.  She does not check her blood pressures at home.  She often gets her blood pressure checked with her nurse at work when she feels it is needed to check.  She does not stay particularly well-hydrated as she notes that she "could do better "tries to drink water.  Has been trying to lessen caffeine use recently.  She is morbidly obese, no regular exercise regimen.  She has not been taking the ferrous sulfate product in the recent past. Tobacco-never smoker   Patient Active Problem List   Diagnosis Date Noted  . History of thyroidectomy 08/05/2017  . Post-surgical hypothyroidism 03/25/2017  . S/P cesarean section: Indication: hx. of myomectomy and CHTN  09/28/2016  . Tenosynovitis of wrist 05/21/2016  . Morbid obesity with BMI of 40.0-44.9, adult ([place]) 03/31/2016  . H/O myomectomy 03/11/2015  . Essential hypertension 11/28/2014  . Obesity, Class III, BMI 40-49.9 (morbid obesity) ([place]) 11/28/2014  . Decreased thyroid stimulating hormone (TSH) level 07/24/2014  . Hypertrichosis 07/24/2014  .  Vitamin D deficiency 07/24/2014  . PCOS (polycystic ovarian syndrome) 07/24/2014  . History of abnormal cervical Pap smear 11/24/2011  . Genital herpes 11/29/2008      Current Outpatient Medications:  .  levothyroxine (SYNTHROID) 200 MCG tablet, TAKE 1 TABLET BY MOUTH DAILY. TAKE AN EXTRA HALF PILL TWICE A WEEK AND RECHECK IN 6 WEEKS, Disp: 32 tablet, Rfl: 4 .  triamterene-hydrochlorothiazide (DYAZIDE) 37.5-25 MG capsule, TAKE 1 EACH (1 CAPSULE TOTAL) BY  MOUTH DAILY., Disp: 90 capsule, Rfl: 1 .  valACYclovir (VALTREX) 500 MG tablet, TAKE 1 TABLET BY MOUTH 2 TIMES DAILY FOR OUTBREAKS AND ONCE DAILY FOR PREVENTION AS DIRECTED, Disp: 60 tablet, Rfl: 3 .  ferrous sulfate 325 (65 FE) MG EC tablet, Take 1 tablet (325 mg total) by mouth 3 (three) times daily with meals. (Patient not taking: Reported on 12/22/2019), Disp: 270 tablet, Rfl: 1   Allergies  Allergen Reactions  . Hydrocodone Itching     Past Surgical History:  Procedure Laterality Date  . BIOPSY THYROID  07/2015   Dr. Gabriel Carina  . CESAREAN SECTION N/A 09/28/2016   Procedure: Primary CESAREAN SECTION;  Surgeon: Servando Salina, MD;  Location: [facility];  Service: Obstetrics;  Laterality: N/A;  EDD: 10/19/16 Allergy: Hydrocodone-Acetaminophen  . LAPAROSCOPIC GELPORT ASSISTED MYOMECTOMY N/A 03/11/2015   Procedure: LAPAROSCOPIC  MYOMECTOMY--attempted;  Surgeon: Rubie Maid, MD;  Location: [facility];  Service: Gynecology;  Laterality: N/A;  . LAPAROTOMY N/A 03/11/2015   Procedure: LAPAROTOMY--MYOMECTOMY;  Surgeon: Rubie Maid, MD;  Location: [facility];  Service: Gynecology;  Laterality: N/A;  . MOUTH SURGERY  1996  . TOTAL THYROIDECTOMY Bilateral 03/05/2017   Dr. Maudie [facility]     Family History  Problem Relation Age of Onset  . Thyroid disease Mother   . Hypertension Mother   . Cancer Maternal Aunt   . Cancer Maternal Grandfather   . Prostate cancer Maternal Grandfather   . Stroke Maternal Grandmother   . Breast cancer Paternal Grandmother   . Heart failure Paternal Grandfather   . Liver disease Brother        Fatty Liver     Social History   Tobacco Use  . Smoking status: Never Smoker  . Smokeless tobacco: Never Used  Substance Use Topics  . Alcohol use: Yes    Comment: occasionally, about once a month    With staff assistance, above reviewed with the patient today.  ROS: As per HPI, otherwise no specific complaints on a limited and focused system review   No  results found for this or any previous visit (from the past 72 hour(s)).   PHQ2/9: Depression screen [facility] 2/9 12/22/2019 07/21/2019 02/03/2019 12/30/2018 11/02/2018  Decreased Interest 0 0 0 0 0  Down, Depressed, Hopeless 1 0 0 0 0  PHQ - 2 Score 1 0 0 0 0  Altered sleeping - 0 0 0 0  Tired, decreased energy - 0 0 0 0  Change in appetite - 0 0 0 0  Feeling bad or failure about yourself  - 0 0 0 0  Trouble concentrating - 0 0 0 0  Moving slowly or fidgety/restless - 0 0 0 0  Suicidal thoughts - 0 0 0 -  PHQ-9 Score - 0 0 0 0  Difficult doing work/chores - - - - -  Some recent data might be hidden   PHQ-2/9 Result reviewed  Fall Risk: Fall Risk  12/22/2019 07/21/2019 02/03/2019 12/30/2018 11/02/2018  Falls in the past year? 0 0 0  0 0  Number falls in past yr: 0 0 0 0 0  Injury with Fall? 0 0 0 0 0  Follow up - - - - -      Objective:   Vitals:   12/22/19 1409  BP: 140/70  Pulse: 95  Resp: 16  Temp: (!) 97.4 F (36.3 C)  TempSrc: Oral  SpO2: 98%  Weight: (!) 332 lb 9.6 oz (150.9 kg)  Height: 5\' 10"  (1.778 m)    Body mass index is 47.72 kg/m.  Physical Exam   NAD, masked, pleasant HEENT - [place]/AT, sclera anicteric, PERRL, EOMI without nystagmus, conj - non-inj'ed, pharynx clear Neck - supple, no adenopathy, no TM, positive scar evident, carotids 2+ and = without bruits bilat Car - RRR without m/g/r, not tachycardic Pulm- RR and effort normal at rest, CTA without wheeze or rales Abd - soft, NT diffusely including nontender in the epigastric region, morbidly obese,  Back - no CVA tenderness (and denies any urinary symptoms of concern) Ext - no marked LE edema,  Neuro/psychiatric - affect was not flat, appropriate with conversation  Alert and oriented  Grossly non-focal - good strength on testing extremities, sensation intact to LT in distal extremities  Speech and gait are normal   Results for orders placed or performed in visit on 02/21/19  Cytology - PAP  Result Value  Ref Range   Adequacy      Satisfactory for evaluation; transformation zone component PRESENT.   Diagnosis      - Negative for intraepithelial lesion or malignancy (NILM)       Assessment & Plan:   1. Lightheaded 2. Fatigue, unspecified type Do feel her symptoms are more consistent with lightheadedness and fatigue then with frank vertigo.  Discussed this with her at length today.  Discussed the numerous entities that can cause these symptoms.  She has a history of iron deficiency anemia, also low thyroid after a thyroidectomy. she notes she has had heavier periods recently which may be a contributor.  Emphasized the importance of staying well-hydrated. We will check labs today, and she will get the labs that were ordered previously but had never come to obtain, and we will add a ferritin to those labs that were ordered. Informed that likely will not have those results back until Monday.   3. Iron deficiency anemia due to chronic blood loss Has a history of this, and has not been taking iron supplements in the recent past. Also concerned with her heavier periods in the recent past as a possible contributor.  4. History of thyroidectomy Has been on Synthroid, and will check a TSH as part of the labs ordered.  5. Post-surgical hypothyroidism As above.  6. Obesity, Class III, BMI 40-49.9 (morbid obesity) ([place]) Has a history of metabolic syndrome, but no history of diabetes noted.  7.  Menorrhagia As above noted, concerned may increase the anemia possibility.  Await lab results presently, and further recommendations pending those results, and noted likely would not have them back until Monday and she was understanding of that. If the lab results do not show significant concerns, and symptoms persisting or more problematic, recommended further follow-up and await the lab results presently.    Towanda Malkin, MD 12/22/19 2:12 PM   12/25/2019 addendum - I did not receive the  patient's labs in my inbox, and did retrieve them and review.  The CBC showed she continued to have a microcytic hypochromic anemia, not significantly changed from previous, possibly  slightly lower.  The ferritin was in the normal range, although was low at 19, significantly lower than previous readings.  I do feel her menorrhagia may be contributing to this, and may need input from gynecology to help presently.  I also would recommend having her return to iron supplements presently. Her vitamin D is also low at 17, and do feel oral supplements would be helpful. The TSH was slightly elevated at 10.07, and if she has been compliant with her thyroid medication, likely needs a slightly higher replacement dose, possibly 0.225 mcg daily. Her glucose was slightly elevated and her A1c was 6.0, consistent with prediabetes The complete metabolic panel showed a slight decrease in her kidney function in the recent past.  I saw she has a follow-up with Dr. Ancil Boozer, her PCP scheduled for Wednesday of this week, and feel best having her input and following up with these labs with the patient on the upcoming in person visit that is scheduled.

## 2019-12-22 NOTE — Telephone Encounter (Signed)
Patient called stating that she feels lightheaded. She states that it is mild and does not interfere with her activity.  She says she is a little tired as well.  She takes thyroid medication and BP medication.  She has not been able check her BP but her watch measures her HR at 88bpm. She denies other symptoms. No sinus symptoms . She has never had any vertigo. She has never been lightheaded. Appointment scheduled per protocol. Care advice read to patient.  Reason for Disposition . Taking a medicine that could cause dizziness (e.g., blood pressure medications, diuretics)  Answer Assessment - Initial Assessment Questions 1. DESCRIPTION: "Describe your dizziness."     lightheaded 2. LIGHTHEADED: "Do you feel lightheaded?" (e.g., somewhat faint, woozy, weak upon standing)    no 3. VERTIGO: "Do you feel like either you or the room is spinning or tilting?" (i.e. vertigo)    no 4. SEVERITY: "How bad is it?"  "Do you feel like you are going to faint?" "Can you stand and walk?"   - MILD: Feels slightly dizzy, but walking normally.   - MODERATE: Feels very unsteady when walking, but not falling; interferes with normal activities (e.g., school, work) .   - SEVERE: Unable to walk without falling, or requires assistance to walk without falling; feels like passing out now.    mild 5. ONSET:  "When did the dizziness begin?"    Wednesday 6. AGGRAVATING FACTORS: "Does anything make it worse?" (e.g., standing, change in head position)     no 7. HEART RATE: "Can you tell me your heart rate?" "How many beats in 15 seconds?"  (Note: not all patients can do this)       88 bpm 8. CAUSE: "What do you think is causing the dizziness?"    thyroid 9. RECURRENT SYMPTOM: "Have you had dizziness before?" If Yes, ask: "When was the last time?" "What happened that time?"     no 10. OTHER SYMPTOMS: "Do you have any other symptoms?" (e.g., fever, chest pain, vomiting, diarrhea, bleeding)      no 11. PREGNANCY: "Is there  any chance you are pregnant?" "When was your last menstrual period?"      No not active  Protocols used: [facility]

## 2019-12-23 LAB — TEST AUTHORIZATION: TEST CODE:: 457

## 2019-12-23 LAB — CBC WITH DIFFERENTIAL/PLATELET
Absolute Monocytes: 468 cells/uL (ref 200–950)
Basophils Absolute: 51 cells/uL (ref 0–200)
Basophils Relative: 0.6 %
Eosinophils Absolute: 60 cells/uL (ref 15–500)
Eosinophils Relative: 0.7 %
HCT: 33.7 % — ABNORMAL LOW (ref 35.0–45.0)
Hemoglobin: 10.8 g/dL — ABNORMAL LOW (ref 11.7–15.5)
Lymphs Abs: 2627 cells/uL (ref 850–3900)
MCH: 25.1 pg — ABNORMAL LOW (ref 27.0–33.0)
MCHC: 32 g/dL (ref 32.0–36.0)
MCV: 78.4 fL — ABNORMAL LOW (ref 80.0–100.0)
MPV: 10.2 fL (ref 7.5–12.5)
Monocytes Relative: 5.5 %
Neutro Abs: 5296 cells/uL (ref 1500–7800)
Neutrophils Relative %: 62.3 %
Platelets: 311 10*3/uL (ref 140–400)
RBC: 4.3 10*6/uL (ref 3.80–5.10)
RDW: 13.8 % (ref 11.0–15.0)
Total Lymphocyte: 30.9 %
WBC: 8.5 10*3/uL (ref 3.8–10.8)

## 2019-12-23 LAB — COMPLETE METABOLIC PANEL WITH GFR
AG Ratio: 1.6 (calc) (ref 1.0–2.5)
ALT: 13 U/L (ref 6–29)
AST: 20 U/L (ref 10–30)
Albumin: 4.5 g/dL (ref 3.6–5.1)
Alkaline phosphatase (APISO): 43 U/L (ref 31–125)
BUN/Creatinine Ratio: 12 (calc) (ref 6–22)
BUN: 14 mg/dL (ref 7–25)
CO2: 29 mmol/L (ref 20–32)
Calcium: 9 mg/dL (ref 8.6–10.2)
Chloride: 103 mmol/L (ref 98–110)
Creat: 1.16 mg/dL — ABNORMAL HIGH (ref 0.50–1.10)
GFR, Est African American: 68 mL/min/{1.73_m2} (ref >=60)
GFR, Est Non African American: 58 mL/min/{1.73_m2} — ABNORMAL LOW (ref >=60)
Globulin: 2.8 g/dL (calc) (ref 1.9–3.7)
Glucose, Bld: 132 mg/dL — ABNORMAL HIGH (ref 65–99)
Potassium: 3.6 mmol/L (ref 3.5–5.3)
Sodium: 138 mmol/L (ref 135–146)
Total Bilirubin: 0.4 mg/dL (ref 0.2–1.2)
Total Protein: 7.3 g/dL (ref 6.1–8.1)

## 2019-12-23 LAB — TSH: TSH: 10.07 mIU/L — ABNORMAL HIGH

## 2019-12-23 LAB — VITAMIN D 25 HYDROXY (VIT D DEFICIENCY, FRACTURES): Vit D, 25-Hydroxy: 17 ng/mL — ABNORMAL LOW (ref 30–100)

## 2019-12-23 LAB — FERRITIN: Ferritin: 19 ng/mL (ref 16–232)

## 2019-12-23 LAB — HEMOGLOBIN A1C
Hgb A1c MFr Bld: 6 % of total Hgb — ABNORMAL HIGH (ref <5.7)
Mean Plasma Glucose: 126 (calc)
eAG (mmol/L): 7 (calc)

## 2019-12-23 LAB — LIPID PANEL
Cholesterol: 131 mg/dL (ref <200)
HDL: 56 mg/dL (ref >=50)
LDL Cholesterol (Calc): 60 mg/dL (calc)
Non-HDL Cholesterol (Calc): 75 mg/dL (calc) (ref <130)
Total CHOL/HDL Ratio: 2.3 (calc) (ref <5.0)
Triglycerides: 70 mg/dL (ref <150)

## 2019-12-26 ENCOUNTER — Other Ambulatory Visit: Payer: Self-pay | Admitting: Family Medicine

## 2019-12-26 MED ORDER — VITAMIN D (ERGOCALCIFEROL) 1.25 MG (50000 UNIT) PO CAPS
50000.0000 [IU] | ORAL_CAPSULE | ORAL | 0 refills | Status: DC
Start: 2019-12-26 — End: 2020-07-10

## 2019-12-26 NOTE — Progress Notes (Signed)
Name: Michelle Ware   MRN: [id number]    DOB: Jun 26, 1980   Date:12/27/2019       Progress Note  Subjective  Chief Complaint  Consult  HPI  HTN: bp is at goal today,  she denies chest pain, dizziness or palpitation   Vitamin D deficiency: rx sent to pharmacy   Anemia:, she states cycles were not as heavy but since delivery back in 09/2016 cycles are heavy, lasts 4-5 days, ferritin is dropping, she has not been taking ferrous sulfate because it causes constipation, she will try to take it twice daily during her cycles and add colace to prevent constipation   Obesity: insurance denied medication for weight loss, and they don't cover bariatric surgery, she has tried a low carbohydrate diet. She is frustrated about weight gain since she has been following a healthier diet   GERD: she states she changed her diet , packing her lunch and not having indigestion. Controlled with life style modifications  Post-surgical hypothyroidism: she states she has been taking medication every morning, but only taking one daily instead of adding a half dose twice a week. She has been trying to lose weight but unable to do it, fatigue last week and foggy minded, saw Dr. Roxan Hockey but states symptoms improve, she thinks secondary to having heavy cycles the week before . TSH above 10. We will adjust dose of medication  Patient Active Problem List   Diagnosis Date Noted  . History of thyroidectomy 08/05/2017  . Post-surgical hypothyroidism 03/25/2017  . S/P cesarean section: Indication: hx. of myomectomy and CHTN  09/28/2016  . Tenosynovitis of wrist 05/21/2016  . Morbid obesity with BMI of 40.0-44.9, adult ([place]) 03/31/2016  . H/O myomectomy 03/11/2015  . Essential hypertension 11/28/2014  . Obesity, Class III, BMI 40-49.9 (morbid obesity) ([place]) 11/28/2014  . Decreased thyroid stimulating hormone (TSH) level 07/24/2014  . Hypertrichosis 07/24/2014  . Vitamin D deficiency 07/24/2014  . PCOS (polycystic  ovarian syndrome) 07/24/2014  . History of abnormal cervical Pap smear 11/24/2011  . Genital herpes 11/29/2008    Past Surgical History:  Procedure Laterality Date  . BIOPSY THYROID  07/2015   Dr. Gabriel Carina  . CESAREAN SECTION N/A 09/28/2016   Procedure: Primary CESAREAN SECTION;  Surgeon: Servando Salina, MD;  Location: [place];  Service: Obstetrics;  Laterality: N/A;  EDD: 10/19/16 Allergy: Hydrocodone-Acetaminophen  . LAPAROSCOPIC GELPORT ASSISTED MYOMECTOMY N/A 03/11/2015   Procedure: LAPAROSCOPIC  MYOMECTOMY--attempted;  Surgeon: Rubie Maid, MD;  Location: [facility];  Service: Gynecology;  Laterality: N/A;  . LAPAROTOMY N/A 03/11/2015   Procedure: LAPAROTOMY--MYOMECTOMY;  Surgeon: Rubie Maid, MD;  Location: [facility];  Service: Gynecology;  Laterality: N/A;  . MOUTH SURGERY  1996  . TOTAL THYROIDECTOMY Bilateral 03/05/2017   Dr. Maudie Mercury [facility]    Family History  Problem Relation Age of Onset  . Thyroid disease Mother   . Hypertension Mother   . Cancer Maternal Aunt   . Cancer Maternal Grandfather   . Prostate cancer Maternal Grandfather   . Stroke Maternal Grandmother   . Breast cancer Paternal Grandmother   . Heart failure Paternal Grandfather   . Liver disease Brother        Fatty Liver    Social History   Tobacco Use  . Smoking status: Never Smoker  . Smokeless tobacco: Never Used  Substance Use Topics  . Alcohol use: Yes    Comment: occasionally, about once a month     Current Outpatient Medications:  .  levothyroxine (  SYNTHROID) 200 MCG tablet, Take 1 tablet (200 mcg total) by mouth daily before breakfast. Take an extra half pill twice weekly, Disp: 32 tablet, Rfl: 1 .  triamterene-hydrochlorothiazide (DYAZIDE) 37.5-25 MG capsule, TAKE 1 EACH (1 CAPSULE TOTAL) BY MOUTH DAILY., Disp: 90 capsule, Rfl: 1 .  valACYclovir (VALTREX) 500 MG tablet, TAKE 1 TABLET BY MOUTH 2 TIMES DAILY FOR OUTBREAKS AND ONCE DAILY FOR PREVENTION AS DIRECTED, Disp: 60 tablet,  Rfl: 3 .  Vitamin D, Ergocalciferol, (DRISDOL) 1.25 MG (50000 UNIT) CAPS capsule, Take 1 capsule (50,000 Units total) by mouth every 7 (seven) days., Disp: 12 capsule, Rfl: 0 .  docusate sodium (COLACE) 100 MG capsule, Take 1 capsule (100 mg total) by mouth daily., Disp: 90 capsule, Rfl: 0 .  ferrous sulfate 325 (65 FE) MG EC tablet, Take 1 tablet (325 mg total) by mouth 3 (three) times daily with meals., Disp: 270 tablet, Rfl: 0  Allergies  Allergen Reactions  . Hydrocodone Itching    I personally reviewed active problem list, medication list, allergies, family history, social history, health maintenance with the patient/caregiver today.   ROS  Ten systems reviewed and is negative except as mentioned in HPI   Objective  Vitals:   12/27/19 1556  BP: 138/82  Pulse: 96  Resp: 18  Temp: 97.9 F (36.6 C)  TempSrc: Oral  SpO2: 96%  Weight: (!) 331 lb 12.8 oz (150.5 kg)  Height: 5\' 10"  (1.778 m)    Body mass index is 47.61 kg/m.  Physical Exam  Constitutional: Patient appears well-developed and well-nourished. Obese , hirsutism  No distress.  HEENT: head atraumatic, normocephalic, pupils equal and reactive to light,neck supple Cardiovascular: Normal rate, regular rhythm and normal heart sounds.  No murmur heard. No BLE edema. Pulmonary/Chest: Effort normal and breath sounds normal. No respiratory distress. Abdominal: Soft.  There is no tenderness. Psychiatric: Patient has a normal mood and affect. behavior is normal. Judgment and thought content normal.  Recent Results (from the past 2160 hour(s))  Lipid panel     Status: None   Collection Time: 12/22/19  2:30 PM  Result Value Ref Range   Cholesterol 131 <200 mg/dL   HDL 56 > OR = 50 mg/dL   Triglycerides 70 <150 mg/dL   LDL Cholesterol (Calc) 60 mg/dL (calc)    Comment: Reference range: <100 . Desirable range <100 mg/dL for primary prevention;   <70 mg/dL for patients with CHD or diabetic patients  with > or = 2 CHD  risk factors. Marland Kitchen LDL-C is now calculated using the Martin-Hopkins  calculation, which is a validated novel method providing  better accuracy than the Friedewald equation in the  estimation of LDL-C.  Cresenciano Genre et al. Annamaria Helling. 1610;960(45): 2061-2068  (http://education.[facility].com/faq/FAQ164)    Total CHOL/HDL Ratio 2.3 <5.0 (calc)   Non-HDL Cholesterol (Calc) 75 <130 mg/dL (calc)    Comment: For patients with diabetes plus 1 major ASCVD risk  factor, treating to a non-HDL-C goal of <100 mg/dL  (LDL-C of <70 mg/dL) is considered a therapeutic  option.   COMPLETE METABOLIC PANEL WITH GFR     Status: Abnormal   Collection Time: 12/22/19  2:30 PM  Result Value Ref Range   Glucose, Bld 132 (H) 65 - 99 mg/dL    Comment: .            Fasting reference interval . For someone without known diabetes, a glucose value >125 mg/dL indicates that they may have diabetes and this should be confirmed with  a follow-up test. .    BUN 14 7 - 25 mg/dL   Creat 1.16 (H) 0.50 - 1.10 mg/dL   GFR, Est Non African American 58 (L) > OR = 60 mL/min/1.72m2   GFR, Est African American 68 > OR = 60 mL/min/1.49m2   BUN/Creatinine Ratio 12 6 - 22 (calc)   Sodium 138 135 - 146 mmol/L   Potassium 3.6 3.5 - 5.3 mmol/L   Chloride 103 98 - 110 mmol/L   CO2 29 20 - 32 mmol/L   Calcium 9.0 8.6 - 10.2 mg/dL   Total Protein 7.3 6.1 - 8.1 g/dL   Albumin 4.5 3.6 - 5.1 g/dL   Globulin 2.8 1.9 - 3.7 g/dL (calc)   AG Ratio 1.6 1.0 - 2.5 (calc)   Total Bilirubin 0.4 0.2 - 1.2 mg/dL   Alkaline phosphatase (APISO) 43 31 - 125 U/L   AST 20 10 - 30 U/L   ALT 13 6 - 29 U/L  CBC with Differential/Platelet     Status: Abnormal   Collection Time: 12/22/19  2:30 PM  Result Value Ref Range   WBC 8.5 3.8 - 10.8 Thousand/uL   RBC 4.30 3.80 - 5.10 Million/uL   Hemoglobin 10.8 (L) 11.7 - 15.5 g/dL   HCT 33.7 (L) 35 - 45 %   MCV 78.4 (L) 80.0 - 100.0 fL   MCH 25.1 (L) 27.0 - 33.0 pg   MCHC 32.0 32.0 - 36.0 g/dL   RDW  13.8 11.0 - 15.0 %   Platelets 311 140 - 400 Thousand/uL   MPV 10.2 7.5 - 12.5 fL   Neutro Abs 5,296 1,500 - 7,800 cells/uL   Lymphs Abs 2,627 850 - 3,900 cells/uL   Absolute Monocytes 468 200 - 950 cells/uL   Eosinophils Absolute 60 15.0 - 500.0 cells/uL   Basophils Absolute 51 0.0 - 200.0 cells/uL   Neutrophils Relative % 62.3 %   Total Lymphocyte 30.9 %   Monocytes Relative 5.5 %   Eosinophils Relative 0.7 %   Basophils Relative 0.6 %  Hemoglobin A1c     Status: Abnormal   Collection Time: 12/22/19  2:30 PM  Result Value Ref Range   Hgb A1c MFr Bld 6.0 (H) <5.7 % of total Hgb    Comment: For someone without known diabetes, a hemoglobin  A1c value between 5.7% and 6.4% is consistent with prediabetes and should be confirmed with a  follow-up test. . For someone with known diabetes, a value <7% indicates that their diabetes is well controlled. A1c targets should be individualized based on duration of diabetes, age, comorbid conditions, and other considerations. . This assay result is consistent with an increased risk of diabetes. . Currently, no consensus exists regarding use of hemoglobin A1c for diagnosis of diabetes for children. .    Mean Plasma Glucose 126 (calc)   eAG (mmol/L) 7.0 (calc)  VITAMIN D 25 Hydroxy (Vit-D Deficiency, Fractures)     Status: Abnormal   Collection Time: 12/22/19  2:30 PM  Result Value Ref Range   Vit D, 25-Hydroxy 17 (L) 30 - 100 ng/mL    Comment: Vitamin D Status         25-OH Vitamin D: . Deficiency:                    <20 ng/mL Insufficiency:             20 - 29 ng/mL Optimal:                 >  or = 30 ng/mL . For 25-OH Vitamin D testing on patients on  D2-supplementation and patients for whom quantitation  of D2 and D3 fractions is required, the [facility](TM) 25-OH VIT D, (D2,D3), LC/MS/MS is recommended: order  code [id number] (patients >81yrs). See Note 1 . Note 1 . For additional information, please refer to   http://education.[facility].com/faq/FAQ199  (This link is being provided for informational/ educational purposes only.)   TSH     Status: Abnormal   Collection Time: 12/22/19  2:30 PM  Result Value Ref Range   TSH 10.07 (H) mIU/L    Comment:           Reference Range .           > or = 20 Years  0.40-4.50 .                Pregnancy Ranges           First trimester    0.26-2.66           Second trimester   0.55-2.73           Third trimester    0.43-2.91   Ferritin     Status: None   Collection Time: 12/22/19  2:30 PM  Result Value Ref Range   Ferritin 19 16 - 232 ng/mL  TEST AUTHORIZATION     Status: None   Collection Time: 12/22/19  2:30 PM  Result Value Ref Range   TEST NAME: FERRITIN    TEST CODE: [id number]    CLIENT CONTACT: DR. Roxan Hockey    REPORT ALWAYS MESSAGE SIGNATURE      Comment: . The laboratory testing on this patient was verbally requested or confirmed by the ordering physician or his or her authorized representative after contact with an employee of [employer]. Federal regulations require that we maintain on file written authorization for all laboratory testing.  Accordingly we are asking that the ordering physician or his or her authorized representative sign a copy of this report and promptly return it to the client service representative. . . Signature:____________________________________________________ .       PHQ2/9: Depression screen [facility] 2/9 12/27/2019 12/22/2019 07/21/2019 02/03/2019 12/30/2018  Decreased Interest 0 0 0 0 0  Down, Depressed, Hopeless 0 1 0 0 0  PHQ - 2 Score 0 1 0 0 0  Altered sleeping - - 0 0 0  Tired, decreased energy - - 0 0 0  Change in appetite - - 0 0 0  Feeling bad or failure about yourself  - - 0 0 0  Trouble concentrating - - 0 0 0  Moving slowly or fidgety/restless - - 0 0 0  Suicidal thoughts - - 0 0 0  PHQ-9 Score - - 0 0 0  Difficult doing work/chores - - - - -  Some recent data might be hidden     phq 9 is negative   Fall Risk: Fall Risk  12/27/2019 12/22/2019 07/21/2019 02/03/2019 12/30/2018  Falls in the past year? 0 0 0 0 0  Number falls in past yr: 0 0 0 0 0  Injury with Fall? 0 0 0 0 0  Follow up - - - - -     Functional Status Survey: Is the patient deaf or have difficulty hearing?: No Does the patient have difficulty seeing, even when wearing glasses/contacts?: No Does the patient have difficulty concentrating, remembering, or making decisions?: No Does the patient have difficulty walking or climbing stairs?: No Does the patient  have difficulty dressing or bathing?: No Does the patient have difficulty doing errands alone such as visiting a doctor's office or shopping?: No    Assessment & Plan  1. Iron deficiency anemia due to chronic blood loss  - ferrous sulfate 325 (65 FE) MG EC tablet; Take 1 tablet (325 mg total) by mouth 3 (three) times daily with meals.  Dispense: 270 tablet; Refill: 0 - docusate sodium (COLACE) 100 MG capsule; Take 1 capsule (100 mg total) by mouth daily.  Dispense: 90 capsule; Refill: 0  2. Post-surgical hypothyroidism  - levothyroxine (SYNTHROID) 200 MCG tablet; Take 1 tablet (200 mcg total) by mouth daily before breakfast. Take an extra half pill twice weekly  Dispense: 32 tablet; Refill: 1 Recheck level in 6 weeks   3. History of thyroidectomy  - levothyroxine (SYNTHROID) 200 MCG tablet; Take 1 tablet (200 mcg total) by mouth daily before breakfast. Take an extra half pill twice weekly  Dispense: 32 tablet; Refill: 1   4. Vitamin D deficiency  rx sent to pharmacy yesterday   5. Essential hypertension  bp is at goal   6. Obesity, Class III, BMI 40-49.9 (morbid obesity) ([place])  Discussed with the patient the risk posed by an increased BMI. Discussed importance of portion control, calorie counting and at least 150 minutes of physical activity weekly. Avoid sweet beverages and drink more water. Eat at least 6 servings of fruit and  vegetables daily   7. Menorrhagia with regular cycle  Needs to resume iron supplementation

## 2019-12-27 ENCOUNTER — Other Ambulatory Visit: Payer: Self-pay

## 2019-12-27 ENCOUNTER — Ambulatory Visit: Payer: 59 | Admitting: Family Medicine

## 2019-12-27 ENCOUNTER — Encounter: Payer: Self-pay | Admitting: Family Medicine

## 2019-12-27 VITALS — BP 138/82 | HR 96 | Temp 97.9°F | Resp 18 | Ht 70.0 in | Wt 331.8 lb

## 2019-12-27 DIAGNOSIS — N920 Excessive and frequent menstruation with regular cycle: Secondary | ICD-10-CM

## 2019-12-27 DIAGNOSIS — D500 Iron deficiency anemia secondary to blood loss (chronic): Secondary | ICD-10-CM

## 2019-12-27 DIAGNOSIS — E559 Vitamin D deficiency, unspecified: Secondary | ICD-10-CM

## 2019-12-27 DIAGNOSIS — I10 Essential (primary) hypertension: Secondary | ICD-10-CM

## 2019-12-27 DIAGNOSIS — E890 Postprocedural hypothyroidism: Secondary | ICD-10-CM

## 2019-12-27 MED ORDER — DOCUSATE SODIUM 100 MG PO CAPS: 100.0000 mg | ORAL_CAPSULE | Freq: Every day | ORAL | 0 refills | Status: DC

## 2019-12-27 MED ORDER — FERROUS SULFATE 325 (65 FE) MG PO TBEC: 325.0000 mg | DELAYED_RELEASE_TABLET | Freq: Three times a day (TID) | ORAL | 0 refills | Status: DC

## 2019-12-27 MED ORDER — LEVOTHYROXINE SODIUM 200 MCG PO TABS: 200.0000 ug | ORAL_TABLET | Freq: Every day | ORAL | 1 refills | Status: DC

## 2020-01-04 ENCOUNTER — Encounter: Payer: Self-pay | Admitting: Cardiology

## 2020-01-04 ENCOUNTER — Other Ambulatory Visit: Payer: Self-pay

## 2020-01-04 ENCOUNTER — Ambulatory Visit: Payer: 59

## 2020-01-04 ENCOUNTER — Ambulatory Visit (INDEPENDENT_AMBULATORY_CARE_PROVIDER_SITE_OTHER): Payer: 59

## 2020-01-04 ENCOUNTER — Ambulatory Visit: Payer: 59 | Admitting: Cardiology

## 2020-01-04 VITALS — BP 140/78 | HR 77 | Ht 70.0 in | Wt 331.4 lb

## 2020-01-04 DIAGNOSIS — R002 Palpitations: Secondary | ICD-10-CM | POA: Diagnosis not present

## 2020-01-04 DIAGNOSIS — I10 Essential (primary) hypertension: Secondary | ICD-10-CM

## 2020-01-04 NOTE — Addendum Note (Signed)
Addended by: Kavin Leech on: 01/04/2020 12:37 PM   Modules accepted: Orders

## 2020-01-04 NOTE — Patient Instructions (Signed)
Medication Instructions:  Your physician recommends that you continue on your current medications as directed. Please refer to the Current Medication list given to you today.  *If you need a refill on your cardiac medications before your next appointment, please call your pharmacy*   Lab Work: None Ordered If you have labs (blood work) drawn today and your tests are completely normal, you will receive your results only by: . MyChart Message (if you have MyChart) OR . A paper copy in the mail If you have any lab test that is abnormal or we need to change your treatment, we will call you to review the results.   Testing/Procedures:  Your physician has recommended that you wear a Zio monitor for 2 weeks. This monitor is a medical device that records the heart's electrical activity. Doctors most often use these monitors to diagnose arrhythmias. Arrhythmias are problems with the speed or rhythm of the heartbeat. The monitor is a small device applied to your chest. You can wear one while you do your normal daily activities. While wearing this monitor if you have any symptoms to push the button and record what you felt. Once you have worn this monitor for the period of time provider prescribed (Usually 14 days), you will return the monitor device in the postage paid box. Once it is returned they will download the data collected and provide us with a report which the provider will then review and we will call you with those results. Important tips:  1. Avoid showering during the first 24 hours of wearing the monitor. 2. Avoid excessive sweating to help maximize wear time. 3. Do not submerge the device, no hot tubs, and no swimming pools. 4. Keep any lotions or oils away from the patch. 5. After 24 hours you may shower with the patch on. Take brief showers with your back facing the shower head.  6. Do not remove patch once it has been placed because that will interrupt data and decrease adhesive wear  time. 7. Push the button when you have any symptoms and write down what you were feeling. 8. Once you have completed wearing your monitor, remove and place into box which has postage paid and place in your outgoing mailbox.  9. If for some reason you have misplaced your box then call our office and we can provide another box and/or mail it off for you.         Follow-Up: At [facility], you and your health needs are our priority.  As part of our continuing mission to provide you with exceptional heart care, we have created designated Provider Care Teams.  These Care Teams include your primary Cardiologist (physician) and Advanced Practice Providers (APPs -  Physician Assistants and Nurse Practitioners) who all work together to provide you with the care you need, when you need it.  We recommend signing up for the patient portal called "MyChart".  Sign up information is provided on this After Visit Summary.  MyChart is used to connect with patients for Virtual Visits (Telemedicine).  Patients are able to view lab/test results, encounter notes, upcoming appointments, etc.  Non-urgent messages can be sent to your provider as well.   To learn more about what you can do with MyChart, go to [email or web address].    Your next appointment:   5 week(s)  The format for your next appointment:   In Person  Provider:   Brian Agbor-Etang, MD   Other Instructions   

## 2020-01-04 NOTE — Progress Notes (Signed)
Cardiology Office Note:    Date:  01/04/2020   ID:  Michelle Ware, DOB [birth year], MRN [id number]  PCP:  [name], MD  [facility] Cardiologist:  No primary care provider on file.  [facility] Electrophysiologist:  None   Referring MD: [name], MD   Chief Complaint  Patient presents with  . New Patient (Initial Visit)    Patient was seen in 2018 for palpitations by former physician Dr. Yvone Neu. Patient c/o palpitations and chest tightness at times.    Michelle Ware is a 39 y.o. female who is being seen today for the evaluation of palpitations and chest pain at the request of [name], MD.   History of Present Illness:    Michelle Ware is a 39 y.o. female with a hx of hypertension, hypothyroidism who presents due to palpitations.  Patient states having occasional skipped heartbeats and a prominent heartbeat not related with exertion.  Symptoms initially began in 2018 when she was pregnant.  Was seen in the office where echo and 24-hour Holter monitor was unrevealing.  Her symptoms improved, but she started noticing symptoms of late.  Typically occur about 2-3 times a week.  Also has a history of hypothyroidism, last TSH 13 days ago indicated she was hypothyroid.  Her thyroid medication was adjusted/increased about a week ago.  Also endorses occasional chest discomfort which improves after she burps.   Past Medical History:  Diagnosis Date  . AMA (advanced maternal age) multigravida 9+, first trimester   . Anemia   . Dermatophytosis of foot   . Fibroid   . Goiter   . Gross hematuria   . Herpes, genital   . Hypertension   . Low grade squamous intraepithelial lesion (LGSIL)   . Morbid obesity with BMI of 40.0-44.9, adult ([place])   . Polycystic ovarian syndrome   . Polycystic ovaries   . Vaginal Pap smear, abnormal   . Vitamin D deficiency     Past Surgical History:  Procedure Laterality Date  . BIOPSY THYROID  07/2015   Dr. Gabriel Carina  . CESAREAN SECTION N/A  09/28/2016   Procedure: Primary CESAREAN SECTION;  Surgeon: Servando Salina, MD;  Location: [facility];  Service: Obstetrics;  Laterality: N/A;  EDD: 10/19/16 Allergy: Hydrocodone-Acetaminophen  . LAPAROSCOPIC GELPORT ASSISTED MYOMECTOMY N/A 03/11/2015   Procedure: LAPAROSCOPIC  MYOMECTOMY--attempted;  Surgeon: Rubie Maid, MD;  Location: [facility];  Service: Gynecology;  Laterality: N/A;  . LAPAROTOMY N/A 03/11/2015   Procedure: LAPAROTOMY--MYOMECTOMY;  Surgeon: Rubie Maid, MD;  Location: [facility];  Service: Gynecology;  Laterality: N/A;  . MOUTH SURGERY  1996  . TOTAL THYROIDECTOMY Bilateral 03/05/2017   Dr. Maudie [facility]    Current Medications: Current Meds  Medication Sig  . ferrous sulfate 325 (65 FE) MG EC tablet Take 1 tablet (325 mg total) by mouth 3 (three) times daily with meals.  Marland Kitchen levothyroxine (SYNTHROID) 200 MCG tablet Take 1 tablet (200 mcg total) by mouth daily before breakfast. Take an extra half pill twice weekly  . triamterene-hydrochlorothiazide (DYAZIDE) 37.5-25 MG capsule TAKE 1 EACH (1 CAPSULE TOTAL) BY MOUTH DAILY.  . valACYclovir (VALTREX) 500 MG tablet TAKE 1 TABLET BY MOUTH 2 TIMES DAILY FOR OUTBREAKS AND ONCE DAILY FOR PREVENTION AS DIRECTED     Allergies:   Hydrocodone   Social History   Socioeconomic History  . Marital status: Single    Spouse name: Not on file  . Number of children: 1  . Years of education: Not on file  .  Highest education level: Bachelor's degree (e.g., BA, AB, BS)  Occupational History  . Occupation: Glass blower/designer    Comment: works for [employer] in [employer]  . Smoking status: Never Smoker  . Smokeless tobacco: Never Used  Vaping Use  . Vaping Use: Never used  Substance and Sexual Activity  . Alcohol use: Yes    Comment: occasionally, about once a month  . Drug use: No  . Sexual activity: Not Currently    Partners: Male    Birth control/protection: Condom  Other Topics Concern  . Not on file   Social History Narrative   Lives by herself, broke up with Carloyn Manner, but they had a son 09/28/2016 and he is involved in their son's life   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   Social Determinants of Health   Financial Resource Strain: Low Risk   . Difficulty of Paying Living Expenses: Not hard at all  Food Insecurity: No Food Insecurity  . Worried About Charity fundraiser in the Last Year: Never true  . Ran Out of Food in the Last Year: Never true  Transportation Needs: No Transportation Needs  . Lack of Transportation (Medical): No  . Lack of Transportation (Non-Medical): No  Physical Activity: Inactive  . Days of Exercise per Week: 0 days  . Minutes of Exercise per Session: 0 min  Stress: No Stress Concern Present  . Feeling of Stress : Not at all  Social Connections: Moderately Integrated  . Frequency of Communication with Friends and Family: More than three times a week  . Frequency of Social Gatherings with Friends and Family: Twice a week  . Attends Religious Services: More than 4 times per year  . Active Member of Clubs or Organizations: Yes  . Attends Archivist Meetings: Never  . Marital Status: Never married     Family History: The patient's family history includes Breast cancer in her paternal grandmother; Cancer in her maternal aunt and maternal grandfather; Heart failure in her paternal grandfather; Hypertension in her mother; Liver disease in her brother; Prostate cancer in her maternal grandfather; Stroke in her maternal grandmother; Thyroid disease in her mother.  ROS:   Please see the history of present illness.     All other systems reviewed and are negative.  EKGs/Labs/Other Studies Reviewed:    The following studies were reviewed today:   EKG:  EKG is  ordered today.  The ekg ordered today demonstrates normal sinus rhythm normal ECG  Recent Labs: 12/22/2019: ALT 13; BUN 14; Creat 1.16; Hemoglobin 10.8;  Platelets 311; Potassium 3.6; Sodium 138; TSH 10.07  Recent Lipid Panel    Component Value Date/Time   CHOL 131 12/22/2019 1430   TRIG 70 12/22/2019 1430   HDL 56 12/22/2019 1430   CHOLHDL 2.3 12/22/2019 1430   LDLCALC 60 12/22/2019 1430     Risk Assessment/Calculations:      Physical Exam:    VS:  BP 140/78 (BP Location: Right Arm, Patient Position: Sitting, Cuff Size: Large)   Pulse 77   Ht 5\' 10"  (1.778 m)   Wt (!) 331 lb 6 oz (150.3 kg)   SpO2 98%   BMI 47.55 kg/m     Wt Readings from Last 3 Encounters:  01/04/20 (!) 331 lb 6 oz (150.3 kg)  12/27/19 (!) 331 lb 12.8 oz (150.5 kg)  12/22/19 (!) 332 lb 9.6 oz (150.9 kg)     GEN:  Well nourished, well developed in no acute distress HEENT: Normal NECK: No JVD; No carotid bruits LYMPHATICS: No lymphadenopathy CARDIAC: RRR, no murmurs, rubs, gallops RESPIRATORY:  Clear to auscultation without rales, wheezing or rhonchi  ABDOMEN: Soft, non-tender, distended MUSCULOSKELETAL:  No edema; No deformity  SKIN: Warm and dry NEUROLOGIC:  Alert and oriented x 3 PSYCHIATRIC:  Normal affect   ASSESSMENT:    1. Palpitations   2. Primary hypertension   3. Morbid obesity ([place])    PLAN:    In order of problems listed above:  1. Patient with palpitations occurring couple of times a week.  Could be related to thyroid dysfunction, underlying arrhythmia also possible.  We will place a 2-week cardiac monitor to evaluate any significant arrhythmias.  Previous echocardiogram showed normal systolic and diastolic function.  Symptoms of chest discomfort appear noncardiac, GI related. 2. Hypertension, BP high normal.  Continue current meds for now, low-salt diet advised. 3. Morbid obesity, low-calorie, weight loss advised.  Follow-up after cardiac monitor.     Medication Adjustments/Labs and Tests Ordered: Current medicines are reviewed at length with the patient today.  Concerns regarding medicines are outlined above.  Orders  Placed This Encounter  Procedures  . LONG TERM MONITOR (3-14 DAYS)  . EKG 12-Lead   No orders of the defined types were placed in this encounter.   Patient Instructions  Medication Instructions:  Your physician recommends that you continue on your current medications as directed. Please refer to the Current Medication list given to you today.  *If you need a refill on your cardiac medications before your next appointment, please call your pharmacy*   Lab Work: None Ordered If you have labs (blood work) drawn today and your tests are completely normal, you will receive your results only by: Marland Kitchen MyChart Message (if you have MyChart) OR . A paper copy in the mail If you have any lab test that is abnormal or we need to change your treatment, we will call you to review the results.   Testing/Procedures:  Your physician has recommended that you wear a Zio monitor for 2 weeks. This monitor is a medical device that records the heart's electrical activity. Doctors most often use these monitors to diagnose arrhythmias. Arrhythmias are problems with the speed or rhythm of the heartbeat. The monitor is a small device applied to your chest. You can wear one while you do your normal daily activities. While wearing this monitor if you have any symptoms to push the button and record what you felt. Once you have worn this monitor for the period of time provider prescribed (Usually 14 days), you will return the monitor device in the postage paid box. Once it is returned they will download the data collected and provide [place] with a report which the provider will then review and we will call you with those results. Important tips:  1. Avoid showering during the first 24 hours of wearing the monitor. 2. Avoid excessive sweating to help maximize wear time. 3. Do not submerge the device, no hot tubs, and no swimming pools. 4. Keep any lotions or oils away from the patch. 5. After 24 hours you may shower with the  patch on. Take brief showers with your back facing the shower head.  6. Do not remove patch once it has been placed because that will interrupt data and decrease adhesive wear time. 7. Push the button when you have any symptoms and write down what you were feeling. 8. Once you  have completed wearing your monitor, remove and place into box which has postage paid and place in your outgoing mailbox.  9. If for some reason you have misplaced your box then call our office and we can provide another box and/or mail it off for you.         Follow-Up: At [facility], you and your health needs are our priority.  As part of our continuing mission to provide you with exceptional heart care, we have created designated Provider Care Teams.  These Care Teams include your primary Cardiologist (physician) and Advanced Practice Providers (APPs -  Physician Assistants and Nurse Practitioners) who all work together to provide you with the care you need, when you need it.  We recommend signing up for the patient portal called "MyChart".  Sign up information is provided on this After Visit Summary.  MyChart is used to connect with patients for Virtual Visits (Telemedicine).  Patients are able to view lab/test results, encounter notes, upcoming appointments, etc.  Non-urgent messages can be sent to your provider as well.   To learn more about what you can do with MyChart, go to [email or web address].    Your next appointment:   5 week(s)  The format for your next appointment:   In Person  Provider:   Kate Sable, MD   Other Instructions      Signed, Kate Sable, MD  01/04/2020 11:15 AM    [place]

## 2020-01-05 ENCOUNTER — Telehealth: Payer: Self-pay | Admitting: Cardiology

## 2020-01-05 NOTE — Telephone Encounter (Signed)
  1. Is this related to a heart monitor you are wearing?  (If the patient says no, please ask     if they are caling about ICD/pacemaker.) yes  2. What is your issue?? Patient work place was very hot today and she has not been able to keep the zio applied .  Patient states she is off work for a while starting Wednesday and wants to know if she has the option to start wearing a new monitor then .      (If the patient is calling for results of the heart monitor this     message should be sent to nurse.)   Please route to covering RN/CMA/RMA for results. Route to monitor technicians or your monitor tech representative for your site for any technical concerns

## 2020-01-08 NOTE — Telephone Encounter (Signed)
Called patient and advised her to call [employer] and let them know what happened.  Most likely, a new one will be mailed to her. She was appreciative and will call them today.

## 2020-01-13 ENCOUNTER — Encounter: Payer: Self-pay | Admitting: Family Medicine

## 2020-01-26 ENCOUNTER — Ambulatory Visit: Payer: 59 | Admitting: Family Medicine

## 2020-01-29 NOTE — Telephone Encounter (Signed)
Spoke with Caryl Pina from [employer].  Please send new order and the new monitor will be mailed to patient.

## 2020-01-29 NOTE — Telephone Encounter (Signed)
Spoke with Purcell Nails at [employer] [facility] who states our office is not required to do anything else at this time. They placed the order on their end to have a new monitor shipped to the patient which has been completed.

## 2020-02-06 ENCOUNTER — Telehealth: Payer: Self-pay

## 2020-02-06 NOTE — Telephone Encounter (Signed)
Notified Michelle Ware she will need to reschedule her appointment on 02/08/2020 with Dr. Garen Lah due to the patient not having worn the [employer] monitor yet. The 1st monitor apparently fell off before the 14 days. The patient has not placed the monitor yet due to so many changes with work and home due to weather. The patient will make sure she places the monitor and she will contact the front desk to let them know when she has placed the monitor so the monitor will be back in time for the follow up visit.

## 2020-02-08 ENCOUNTER — Ambulatory Visit: Payer: 59 | Admitting: Cardiology

## 2020-02-08 NOTE — Progress Notes (Deleted)
Name: Michelle Ware   MRN: [id number]    DOB: Oct 11, 1980   Date:02/08/2020       Progress Note  Subjective  Chief Complaint  Follow up   HPI HTN: bp is at goal today,  she denies chest pain, dizziness or palpitation   Vitamin D deficiency: rx sent to pharmacy   Anemia:, she states cycles were not as heavy but since delivery back in 09/2016 cycles are heavy, lasts 4-5 days, ferritin is dropping, she has not been taking ferrous sulfate because it causes constipation, she will try to take it twice daily during her cycles and add colace to prevent constipation   Obesity: insurance denied medication for weight loss, and they don't cover bariatric surgery, she has tried a low carbohydrate diet. She is frustrated about weight gain since she has been following a healthier diet   GERD: she states she changed her diet , packing her lunch and not having indigestion. Controlled with life style modifications  Post-surgical hypothyroidism: she states she has been taking medication every morning, but only taking one daily instead of adding a half dose twice a week. She has been trying to lose weight but unable to do it, fatigue last week and foggy minded, saw Dr. Roxan Hockey but states symptoms improve, she thinks secondary to having heavy cycles the week before . TSH above 10. We will adjust dose of medication  *** Patient Active Problem List   Diagnosis Date Noted  . History of thyroidectomy 08/05/2017  . Post-surgical hypothyroidism 03/25/2017  . S/P cesarean section: Indication: hx. of myomectomy and CHTN  09/28/2016  . Tenosynovitis of wrist 05/21/2016  . Morbid obesity with BMI of 40.0-44.9, adult ([place]) 03/31/2016  . H/O myomectomy 03/11/2015  . Essential hypertension 11/28/2014  . Obesity, Class III, BMI 40-49.9 (morbid obesity) ([place]) 11/28/2014  . Decreased thyroid stimulating hormone (TSH) level 07/24/2014  . Hypertrichosis 07/24/2014  . Vitamin D deficiency 07/24/2014  . PCOS  (polycystic ovarian syndrome) 07/24/2014  . History of abnormal cervical Pap smear 11/24/2011  . Genital herpes 11/29/2008    Past Surgical History:  Procedure Laterality Date  . BIOPSY THYROID  07/2015   Dr. Gabriel Carina  . CESAREAN SECTION N/A 09/28/2016   Procedure: Primary CESAREAN SECTION;  Surgeon: Servando Salina, MD;  Location: [place];  Service: Obstetrics;  Laterality: N/A;  EDD: 10/19/16 Allergy: Hydrocodone-Acetaminophen  . LAPAROSCOPIC GELPORT ASSISTED MYOMECTOMY N/A 03/11/2015   Procedure: LAPAROSCOPIC  MYOMECTOMY--attempted;  Surgeon: Rubie Maid, MD;  Location: [facility];  Service: Gynecology;  Laterality: N/A;  . LAPAROTOMY N/A 03/11/2015   Procedure: LAPAROTOMY--MYOMECTOMY;  Surgeon: Rubie Maid, MD;  Location: [facility];  Service: Gynecology;  Laterality: N/A;  . MOUTH SURGERY  1996  . TOTAL THYROIDECTOMY Bilateral 03/05/2017   Dr. Maudie Mercury [facility]    Family History  Problem Relation Age of Onset  . Thyroid disease Mother   . Hypertension Mother   . Cancer Maternal Aunt   . Cancer Maternal Grandfather   . Prostate cancer Maternal Grandfather   . Stroke Maternal Grandmother   . Breast cancer Paternal Grandmother   . Heart failure Paternal Grandfather   . Liver disease Brother        Fatty Liver    Social History   Tobacco Use  . Smoking status: Never Smoker  . Smokeless tobacco: Never Used  Substance Use Topics  . Alcohol use: Yes    Comment: occasionally, about once a month     Current Outpatient Medications:  .  docusate sodium (COLACE) 100 MG capsule, Take 1 capsule (100 mg total) by mouth daily. (Patient not taking: Reported on 01/04/2020), Disp: 90 capsule, Rfl: 0 .  ferrous sulfate 325 (65 FE) MG EC tablet, Take 1 tablet (325 mg total) by mouth 3 (three) times daily with meals., Disp: 270 tablet, Rfl: 0 .  levothyroxine (SYNTHROID) 200 MCG tablet, Take 1 tablet (200 mcg total) by mouth daily before breakfast. Take an extra half pill twice weekly,  Disp: 32 tablet, Rfl: 1 .  triamterene-hydrochlorothiazide (DYAZIDE) 37.5-25 MG capsule, TAKE 1 EACH (1 CAPSULE TOTAL) BY MOUTH DAILY., Disp: 90 capsule, Rfl: 1 .  valACYclovir (VALTREX) 500 MG tablet, TAKE 1 TABLET BY MOUTH 2 TIMES DAILY FOR OUTBREAKS AND ONCE DAILY FOR PREVENTION AS DIRECTED, Disp: 60 tablet, Rfl: 3 .  Vitamin D, Ergocalciferol, (DRISDOL) 1.25 MG (50000 UNIT) CAPS capsule, Take 1 capsule (50,000 Units total) by mouth every 7 (seven) days. (Patient not taking: Reported on 01/04/2020), Disp: 12 capsule, Rfl: 0  Allergies  Allergen Reactions  . Hydrocodone Itching    I personally reviewed {Reviewed:[id number]} with the patient/caregiver today.   ROS  ***  Objective  There were no vitals filed for this visit.  There is no height or weight on file to calculate BMI.  Physical Exam ***  Recent Results (from the past 2160 hour(s))  Lipid panel     Status: None   Collection Time: 12/22/19  2:30 PM  Result Value Ref Range   Cholesterol 131 <200 mg/dL   HDL 56 > OR = 50 mg/dL   Triglycerides 70 <150 mg/dL   LDL Cholesterol (Calc) 60 mg/dL (calc)    Comment: Reference range: <100 . Desirable range <100 mg/dL for primary prevention;   <70 mg/dL for patients with CHD or diabetic patients  with > or = 2 CHD risk factors. Marland Kitchen LDL-C is now calculated using the Martin-Hopkins  calculation, which is a validated novel method providing  better accuracy than the Friedewald equation in the  estimation of LDL-C.  Cresenciano Genre et al. Annamaria Helling. 5366;440(34): 2061-2068  (http://education.[facility].com/faq/FAQ164)    Total CHOL/HDL Ratio 2.3 <5.0 (calc)   Non-HDL Cholesterol (Calc) 75 <130 mg/dL (calc)    Comment: For patients with diabetes plus 1 major ASCVD risk  factor, treating to a non-HDL-C goal of <100 mg/dL  (LDL-C of <70 mg/dL) is considered a therapeutic  option.   COMPLETE METABOLIC PANEL WITH GFR     Status: Abnormal   Collection Time: 12/22/19  2:30 PM  Result  Value Ref Range   Glucose, Bld 132 (H) 65 - 99 mg/dL    Comment: .            Fasting reference interval . For someone without known diabetes, a glucose value >125 mg/dL indicates that they may have diabetes and this should be confirmed with a follow-up test. .    BUN 14 7 - 25 mg/dL   Creat 1.16 (H) 0.50 - 1.10 mg/dL   GFR, Est Non African American 58 (L) > OR = 60 mL/min/1.26m2   GFR, Est African American 68 > OR = 60 mL/min/1.62m2   BUN/Creatinine Ratio 12 6 - 22 (calc)   Sodium 138 135 - 146 mmol/L   Potassium 3.6 3.5 - 5.3 mmol/L   Chloride 103 98 - 110 mmol/L   CO2 29 20 - 32 mmol/L   Calcium 9.0 8.6 - 10.2 mg/dL   Total Protein 7.3 6.1 - 8.1 g/dL   Albumin 4.5 3.6 -  5.1 g/dL   Globulin 2.8 1.9 - 3.7 g/dL (calc)   AG Ratio 1.6 1.0 - 2.5 (calc)   Total Bilirubin 0.4 0.2 - 1.2 mg/dL   Alkaline phosphatase (APISO) 43 31 - 125 U/L   AST 20 10 - 30 U/L   ALT 13 6 - 29 U/L  CBC with Differential/Platelet     Status: Abnormal   Collection Time: 12/22/19  2:30 PM  Result Value Ref Range   WBC 8.5 3.8 - 10.8 Thousand/uL   RBC 4.30 3.80 - 5.10 Million/uL   Hemoglobin 10.8 (L) 11.7 - 15.5 g/dL   HCT 33.7 (L) 35.0 - 45.0 %   MCV 78.4 (L) 80.0 - 100.0 fL   MCH 25.1 (L) 27.0 - 33.0 pg   MCHC 32.0 32.0 - 36.0 g/dL   RDW 13.8 11.0 - 15.0 %   Platelets 311 140 - 400 Thousand/uL   MPV 10.2 7.5 - 12.5 fL   Neutro Abs 5,296 1,500 - 7,800 cells/uL   Lymphs Abs 2,627 850 - 3,900 cells/uL   Absolute Monocytes 468 200 - 950 cells/uL   Eosinophils Absolute 60 15 - 500 cells/uL   Basophils Absolute 51 0 - 200 cells/uL   Neutrophils Relative % 62.3 %   Total Lymphocyte 30.9 %   Monocytes Relative 5.5 %   Eosinophils Relative 0.7 %   Basophils Relative 0.6 %  Hemoglobin A1c     Status: Abnormal   Collection Time: 12/22/19  2:30 PM  Result Value Ref Range   Hgb A1c MFr Bld 6.0 (H) <5.7 % of total Hgb    Comment: For someone without known diabetes, a hemoglobin  A1c value between  5.7% and 6.4% is consistent with prediabetes and should be confirmed with a  follow-up test. . For someone with known diabetes, a value <7% indicates that their diabetes is well controlled. A1c targets should be individualized based on duration of diabetes, age, comorbid conditions, and other considerations. . This assay result is consistent with an increased risk of diabetes. . Currently, no consensus exists regarding use of hemoglobin A1c for diagnosis of diabetes for children. .    Mean Plasma Glucose 126 (calc)   eAG (mmol/L) 7.0 (calc)  VITAMIN D 25 Hydroxy (Vit-D Deficiency, Fractures)     Status: Abnormal   Collection Time: 12/22/19  2:30 PM  Result Value Ref Range   Vit D, 25-Hydroxy 17 (L) 30 - 100 ng/mL    Comment: Vitamin D Status         25-OH Vitamin D: . Deficiency:                    <20 ng/mL Insufficiency:             20 - 29 ng/mL Optimal:                 > or = 30 ng/mL . For 25-OH Vitamin D testing on patients on  D2-supplementation and patients for whom quantitation  of D2 and D3 fractions is required, the [facility](TM) 25-OH VIT D, (D2,D3), LC/MS/MS is recommended: order  code [id number] (patients >79yrs). See Note 1 . Note 1 . For additional information, please refer to  http://education.[facility].com/faq/FAQ199  (This link is being provided for informational/ educational purposes only.)   TSH     Status: Abnormal   Collection Time: 12/22/19  2:30 PM  Result Value Ref Range   TSH 10.07 (H) mIU/L    Comment:  Reference Range .           > or = 20 Years  0.40-4.50 .                Pregnancy Ranges           First trimester    0.26-2.66           Second trimester   0.55-2.73           Third trimester    0.43-2.91   Ferritin     Status: None   Collection Time: 12/22/19  2:30 PM  Result Value Ref Range   Ferritin 19 16 - 232 ng/mL  TEST AUTHORIZATION     Status: None   Collection Time: 12/22/19  2:30 PM  Result Value Ref  Range   TEST NAME: FERRITIN    TEST CODE: [id number]    CLIENT CONTACT: DR. Roxan Hockey    REPORT ALWAYS MESSAGE SIGNATURE      Comment: . The laboratory testing on this patient was verbally requested or confirmed by the ordering physician or his or her authorized representative after contact with an employee of [employer]. Federal regulations require that we maintain on file written authorization for all laboratory testing.  Accordingly we are asking that the ordering physician or his or her authorized representative sign a copy of this report and promptly return it to the client service representative. . . Signature:____________________________________________________ .     Diabetic Foot Exam: Diabetic Foot Exam - Simple   No data filed    ***  PHQ2/9: Depression screen [facility] 2/9 12/27/2019 12/22/2019 07/21/2019 02/03/2019 12/30/2018  Decreased Interest 0 0 0 0 0  Down, Depressed, Hopeless 0 1 0 0 0  PHQ - 2 Score 0 1 0 0 0  Altered sleeping - - 0 0 0  Tired, decreased energy - - 0 0 0  Change in appetite - - 0 0 0  Feeling bad or failure about yourself  - - 0 0 0  Trouble concentrating - - 0 0 0  Moving slowly or fidgety/restless - - 0 0 0  Suicidal thoughts - - 0 0 0  PHQ-9 Score - - 0 0 0  Difficult doing work/chores - - - - -  Some recent data might be hidden    phq 9 is {gen pos NO:[id number] ***  Fall Risk: Fall Risk  12/27/2019 12/22/2019 07/21/2019 02/03/2019 12/30/2018  Falls in the past year? 0 0 0 0 0  Number falls in past yr: 0 0 0 0 0  Injury with Fall? 0 0 0 0 0  Follow up - - - - -   ***   Functional Status Survey:   ***   Assessment & Plan  *** There are no diagnoses linked to this encounter.

## 2020-02-12 ENCOUNTER — Ambulatory Visit: Payer: 59 | Admitting: Family Medicine

## 2020-03-11 ENCOUNTER — Other Ambulatory Visit: Payer: Self-pay | Admitting: Family Medicine

## 2020-03-11 ENCOUNTER — Telehealth: Payer: Self-pay | Admitting: Family Medicine

## 2020-03-11 DIAGNOSIS — E890 Postprocedural hypothyroidism: Secondary | ICD-10-CM

## 2020-03-11 DIAGNOSIS — I10 Essential (primary) hypertension: Secondary | ICD-10-CM

## 2020-03-12 NOTE — Telephone Encounter (Signed)
Pt states she will come by and get labs done and schedule her next appt

## 2020-03-28 ENCOUNTER — Other Ambulatory Visit: Payer: Self-pay | Admitting: Family Medicine

## 2020-03-28 DIAGNOSIS — E890 Postprocedural hypothyroidism: Secondary | ICD-10-CM

## 2020-03-28 LAB — TSH: TSH: 1.72 mIU/L

## 2020-03-28 MED ORDER — LEVOTHYROXINE SODIUM 200 MCG PO TABS: 200.0000 ug | ORAL_TABLET | Freq: Every day | ORAL | 0 refills | Status: DC

## 2020-04-08 ENCOUNTER — Encounter: Payer: Self-pay | Admitting: Family Medicine

## 2020-04-13 ENCOUNTER — Other Ambulatory Visit: Payer: Self-pay | Admitting: Family Medicine

## 2020-04-13 DIAGNOSIS — I10 Essential (primary) hypertension: Secondary | ICD-10-CM

## 2020-04-13 NOTE — Telephone Encounter (Signed)
Approved per protocol. Providing 30 day supply as she is due for appointment within the next 30 days. Requested Prescriptions  Pending Prescriptions Disp Refills  . triamterene-hydrochlorothiazide (DYAZIDE) 37.5-25 MG capsule [Pharmacy Med Name: TRIAMTERENE-HCTZ 37.5-25 MG CP] 30 capsule 0    Sig: TAKE 1 EACH (1 CAPSULE TOTAL) BY MOUTH DAILY.     Cardiovascular: Diuretic Combos Failed - 04/13/2020 11:31 AM      Failed - Cr in normal range and within 360 days    Creat  Date Value Ref Range Status  12/22/2019 1.16 (H) 0.50 - 1.10 mg/dL Final         Failed - Last BP in normal range    BP Readings from Last 1 Encounters:  01/04/20 140/78         Passed - K in normal range and within 360 days    Potassium  Date Value Ref Range Status  12/22/2019 3.6 3.5 - 5.3 mmol/L Final         Passed - Na in normal range and within 360 days    Sodium  Date Value Ref Range Status  12/22/2019 138 135 - 146 mmol/L Final  04/07/2016 136 134 - 144 mmol/L Final         Passed - Ca in normal range and within 360 days    Calcium  Date Value Ref Range Status  12/22/2019 9.0 8.6 - 10.2 mg/dL Final         Passed - Valid encounter within last 6 months    Recent Outpatient Visits          3 months ago Vitamin D deficiency   [facility] [name], MD   3 months ago Lightheaded   [place], MD   8 months ago Obesity, Class III, BMI 40-49.9 (morbid obesity) [facility])   [facility] [name], MD   1 year ago Cervical cancer screening   [facility], Drue Stager, MD   1 year ago Iron deficiency anemia due to chronic blood loss   [facility] [name], MD

## 2020-05-14 ENCOUNTER — Other Ambulatory Visit: Payer: Self-pay | Admitting: Family Medicine

## 2020-05-14 DIAGNOSIS — I10 Essential (primary) hypertension: Secondary | ICD-10-CM

## 2020-05-28 ENCOUNTER — Other Ambulatory Visit: Payer: Self-pay | Admitting: Family Medicine

## 2020-05-28 DIAGNOSIS — I10 Essential (primary) hypertension: Secondary | ICD-10-CM

## 2020-05-28 NOTE — Telephone Encounter (Signed)
Requested medication (s) are due for refill today: no  Requested medication (s) are on the active medication list: yes  Last refill:  05/14/20  Future visit scheduled: yes  Notes to clinic:  high Cr- requesting 90 day refills   Requested Prescriptions  Pending Prescriptions Disp Refills   triamterene-hydrochlorothiazide (DYAZIDE) 37.5-25 MG capsule [Pharmacy Med Name: TRIAMTERENE-HCTZ 37.5-25 MG CP] 90 capsule 1    Sig: TAKE 1 EACH (1 CAPSULE TOTAL) BY MOUTH DAILY.      Cardiovascular: Diuretic Combos Failed - 05/28/2020  9:26 AM      Failed - Cr in normal range and within 360 days    Creat  Date Value Ref Range Status  12/22/2019 1.16 (H) 0.50 - 1.10 mg/dL Final          Failed - Last BP in normal range    BP Readings from Last 1 Encounters:  01/04/20 140/78          Passed - K in normal range and within 360 days    Potassium  Date Value Ref Range Status  12/22/2019 3.6 3.5 - 5.3 mmol/L Final          Passed - Na in normal range and within 360 days    Sodium  Date Value Ref Range Status  12/22/2019 138 135 - 146 mmol/L Final  04/07/2016 136 134 - 144 mmol/L Final          Passed - Ca in normal range and within 360 days    Calcium  Date Value Ref Range Status  12/22/2019 9.0 8.6 - 10.2 mg/dL Final          Passed - Valid encounter within last 6 months    Recent Outpatient Visits           5 months ago Vitamin D deficiency   [facility] [name], MD   5 months ago Lightheaded   [place], MD   10 months ago Obesity, Class III, BMI 40-49.9 (morbid obesity) [facility])   [facility] [name], MD   1 year ago Cervical cancer screening   [facility] [place], Drue Stager, MD   1 year ago Iron deficiency anemia due to chronic blood loss   [facility] [name], MD       Future Appointments             In 1 month Ancil Boozer,  Drue Stager, MD [facility], [place]   In 3 months [name], MD [facility], [facility]

## 2020-06-19 ENCOUNTER — Other Ambulatory Visit: Payer: Self-pay

## 2020-06-19 ENCOUNTER — Other Ambulatory Visit: Payer: Self-pay | Admitting: Family Medicine

## 2020-06-19 DIAGNOSIS — I10 Essential (primary) hypertension: Secondary | ICD-10-CM

## 2020-06-19 NOTE — Telephone Encounter (Signed)
Requested Prescriptions  Pending Prescriptions Disp Refills  . triamterene-hydrochlorothiazide (DYAZIDE) 37.5-25 MG capsule [Pharmacy Med Name: TRIAMTERENE-HCTZ 37.5-25 MG CP] 30 capsule 0    Sig: TAKE 1 EACH (1 CAPSULE TOTAL) BY MOUTH DAILY.     Cardiovascular: Diuretic Combos Failed - 06/19/2020  1:31 PM      Failed - Cr in normal range and within 360 days    Creat  Date Value Ref Range Status  12/22/2019 1.16 (H) 0.50 - 1.10 mg/dL Final         Failed - Last BP in normal range    BP Readings from Last 1 Encounters:  01/04/20 140/78         Passed - K in normal range and within 360 days    Potassium  Date Value Ref Range Status  12/22/2019 3.6 3.5 - 5.3 mmol/L Final         Passed - Na in normal range and within 360 days    Sodium  Date Value Ref Range Status  12/22/2019 138 135 - 146 mmol/L Final  04/07/2016 136 134 - 144 mmol/L Final         Passed - Ca in normal range and within 360 days    Calcium  Date Value Ref Range Status  12/22/2019 9.0 8.6 - 10.2 mg/dL Final         Passed - Valid encounter within last 6 months    Recent Outpatient Visits          5 months ago Vitamin D deficiency   [facility] [name], MD   6 months ago Lightheaded   [place], MD   11 months ago Obesity, Class III, BMI 40-49.9 (morbid obesity) [facility])   [facility] [name], MD   1 year ago Cervical cancer screening   [facility] [place], Drue Stager, MD   1 year ago Iron deficiency anemia due to chronic blood loss   [facility] [name], MD      Future Appointments            In 1 week [name], MD [facility], [place]   In 3 months [name], MD [facility], [facility]

## 2020-07-02 ENCOUNTER — Ambulatory Visit: Payer: 59 | Admitting: Family Medicine

## 2020-07-05 ENCOUNTER — Ambulatory Visit: Payer: 59 | Admitting: Family Medicine

## 2020-07-05 ENCOUNTER — Other Ambulatory Visit: Payer: Self-pay | Admitting: Family Medicine

## 2020-07-05 DIAGNOSIS — E890 Postprocedural hypothyroidism: Secondary | ICD-10-CM

## 2020-07-06 NOTE — Telephone Encounter (Signed)
Requested Prescriptions  Pending Prescriptions Disp Refills  . levothyroxine (SYNTHROID) 200 MCG tablet [Pharmacy Med Name: LEVOTHYROXINE 200 MCG TABLET] 96 tablet 3    Sig: TAKE 1 TABLET (200 MCG TOTAL) BY MOUTH DAILY BEFORE BREAKFAST. TAKE AN EXTRA HALF PILL TWICE WEEKLY     Endocrinology:  Hypothyroid Agents Failed - 07/05/2020  6:18 PM      Failed - TSH needs to be rechecked within 3 months after an abnormal result. Refill until TSH is due.      Passed - TSH in normal range and within 360 days    TSH  Date Value Ref Range Status  03/27/2020 1.72 mIU/L Final    Comment:              Reference Range .           > or = 20 Years  0.40-4.50 .                Pregnancy Ranges           First trimester    0.26-2.66           Second trimester   0.55-2.73           Third trimester    0.43-2.91          Passed - Valid encounter within last 12 months    Recent Outpatient Visits          6 months ago Vitamin D deficiency   [facility] [name], MD   6 months ago Lightheaded   [place], MD   11 months ago Obesity, Class III, BMI 40-49.9 (morbid obesity) [facility])   [facility] [name], MD   1 year ago Cervical cancer screening   [facility] [place], Drue Stager, MD   1 year ago Iron deficiency anemia due to chronic blood loss   [facility] [name], MD      Future Appointments            In 2 days Delsa Grana, PA-C [facility], [place]   In 2 months [name], MD [facility], [facility]

## 2020-07-08 ENCOUNTER — Encounter: Payer: Self-pay | Admitting: Family Medicine

## 2020-07-08 ENCOUNTER — Other Ambulatory Visit: Payer: Self-pay

## 2020-07-08 ENCOUNTER — Ambulatory Visit: Payer: 59 | Admitting: Family Medicine

## 2020-07-08 VITALS — BP 132/80 | HR 74 | Temp 97.8°F | Resp 18 | Ht 70.0 in | Wt 328.8 lb

## 2020-07-08 DIAGNOSIS — E559 Vitamin D deficiency, unspecified: Secondary | ICD-10-CM

## 2020-07-08 DIAGNOSIS — I10 Essential (primary) hypertension: Secondary | ICD-10-CM | POA: Diagnosis not present

## 2020-07-08 DIAGNOSIS — Z5181 Encounter for therapeutic drug level monitoring: Secondary | ICD-10-CM

## 2020-07-08 DIAGNOSIS — E890 Postprocedural hypothyroidism: Secondary | ICD-10-CM | POA: Diagnosis not present

## 2020-07-08 DIAGNOSIS — R7303 Prediabetes: Secondary | ICD-10-CM

## 2020-07-08 DIAGNOSIS — Z6841 Body Mass Index (BMI) 40.0 and over, adult: Secondary | ICD-10-CM

## 2020-07-08 DIAGNOSIS — D500 Iron deficiency anemia secondary to blood loss (chronic): Secondary | ICD-10-CM

## 2020-07-08 DIAGNOSIS — A6000 Herpesviral infection of urogenital system, unspecified: Secondary | ICD-10-CM

## 2020-07-08 MED ORDER — TRIAMTERENE-HCTZ 37.5-25 MG PO CAPS: 1.0000 | ORAL_CAPSULE | Freq: Every day | ORAL | 1 refills | Status: DC

## 2020-07-08 NOTE — Progress Notes (Signed)
Name: Michelle Ware   MRN: [id number]    DOB: [birth year]   Date:07/08/2020       Progress Note  Chief Complaint  Patient presents with   Consult    Pap smear      Subjective:   Michelle Ware is a 40 y.o. female, presents to clinic for routine f/up  She requests PAP today, but has had multiple normal paps in the past 2-3 years and is scheduled for physical with her PCP in a few months. 02/21/19 normal pap - not due until 02/2022 12/2017 PAP normal  12/2015 PAP and HPV normal/neg   Hypertension:  Currently managed on triamterene-HCTZ Pt reports good med compliance and denies any SE.   Blood pressure today is well controlled. BP Readings from Last 3 Encounters:  07/08/20 132/80  01/04/20 140/78  12/27/19 138/82   Pt denies CP, SOB, exertional sx, LE edema, palpitation, Ha's, visual disturbances, lightheadedness, hypotension, syncope.   Hypothyroid - post surgical Last TSH was in normal range, prior to that it was elevated and med dose was adjusted Lab Results  Component Value Date   TSH 1.72 03/27/2020  Taking 200 mcg levothyroxine 5 d a week and 300 mcg total 2 days a week Sometimes feels fatigued, gained a little weight  Vit D deficiency  Last vitamin D Lab Results  Component Value Date   VD25OH 63 (L) 12/22/2019   She was given Rx strength Vit d supplement last Dec, completed that, recheck labs today on OTC supplement She doesn't recall last Rx she took   Anemia:  iron deficiency anemia, notes she has always been iron deficient Hemoglobin  Date Value Ref Range Status  12/22/2019 10.8 (L) 11.7 - 15.5 g/dL Final  12/30/2018 11.2 (L) 11.7 - 15.5 g/dL Final  08/02/2018 11.8 11.7 - 15.5 g/dL Final  07/01/2018 11.0 (L) 11.7 - 15.5 g/dL Final  03/06/2016 10.0 (L) 11.1 - 15.9 g/dL Final  10/26/2014 11.0 (L) 11.1 - 15.9 g/dL Final   On oral iron supplement, prescribed TID, difficulty getting doses in  Lab Results  Component Value Date   IRON 99 08/02/2018   TIBC  356 08/02/2018   FERRITIN 19 12/22/2019       Current Outpatient Medications:    docusate sodium (COLACE) 100 MG capsule, Take 1 capsule (100 mg total) by mouth daily. (Patient not taking: Reported on 01/04/2020), Disp: 90 capsule, Rfl: 0   ferrous sulfate 325 (65 FE) MG EC tablet, Take 1 tablet (325 mg total) by mouth 3 (three) times daily with meals., Disp: 270 tablet, Rfl: 0   levothyroxine (SYNTHROID) 200 MCG tablet, TAKE 1 TABLET (200 MCG TOTAL) BY MOUTH DAILY BEFORE BREAKFAST. TAKE AN EXTRA HALF PILL TWICE WEEKLY, Disp: 96 tablet, Rfl: 3   triamterene-hydrochlorothiazide (DYAZIDE) 37.5-25 MG capsule, Take 1 each (1 capsule total) by mouth daily., Disp: 90 capsule, Rfl: 1   valACYclovir (VALTREX) 500 MG tablet, TAKE 1 TABLET BY MOUTH 2 TIMES DAILY FOR OUTBREAKS AND ONCE DAILY FOR PREVENTION AS DIRECTED, Disp: 60 tablet, Rfl: 3   Vitamin D, Ergocalciferol, (DRISDOL) 1.25 MG (50000 UNIT) CAPS capsule, Take 1 capsule (50,000 Units total) by mouth every 7 (seven) days. (Patient not taking: Reported on 01/04/2020), Disp: 12 capsule, Rfl: 0  Patient Active Problem List   Diagnosis Date Noted   History of thyroidectomy 08/05/2017   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 of myomectomy and CHTN  09/28/2016   Tenosynovitis of wrist 05/21/2016  Morbid obesity with BMI of 40.0-44.9, adult ([place]) 03/31/2016   H/O myomectomy 03/11/2015   Essential hypertension 11/28/2014   Obesity, Class III, BMI 40-49.9 (morbid obesity) ([place]) 11/28/2014   Decreased thyroid stimulating hormone (TSH) level 07/24/2014   Hypertrichosis 07/24/2014   Vitamin D deficiency 07/24/2014   PCOS (polycystic ovarian syndrome) 07/24/2014   History of abnormal cervical Pap smear 11/24/2011   Genital herpes 11/29/2008    Past Surgical History:  Procedure Laterality Date   BIOPSY THYROID  07/2015   Dr. Gabriel Carina   CESAREAN SECTION N/A 09/28/2016   Procedure: Primary CESAREAN SECTION;   Surgeon: Servando Salina, MD;  Location: [facility];  Service: Obstetrics;  Laterality: N/A;  EDD: 10/19/16 Allergy: Hydrocodone-Acetaminophen   LAPAROSCOPIC GELPORT ASSISTED MYOMECTOMY N/A 03/11/2015   Procedure: LAPAROSCOPIC  MYOMECTOMY--attempted;  Surgeon: Rubie Maid, MD;  Location: [facility];  Service: Gynecology;  Laterality: N/A;   LAPAROTOMY N/A 03/11/2015   Procedure: LAPAROTOMY--MYOMECTOMY;  Surgeon: Rubie Maid, MD;  Location: [facility];  Service: Gynecology;  Laterality: N/A;   MOUTH SURGERY  1996   TOTAL THYROIDECTOMY Bilateral 03/05/2017   Dr. Maudie Mercury - [facility]    Family History  Problem Relation Age of Onset   Thyroid disease Mother    Hypertension Mother    Cancer Maternal Aunt    Cancer Maternal Grandfather    Prostate cancer Maternal Grandfather    Stroke Maternal Grandmother    Breast cancer Paternal Grandmother    Heart failure Paternal Grandfather    Liver disease Brother        Fatty Liver    Social History   Tobacco Use   Smoking status: Never   Smokeless tobacco: Never  Vaping Use   Vaping Use: Never used  Substance Use Topics   Alcohol use: Yes    Comment: occasionally, about once a month   Drug use: No     Allergies  Allergen Reactions   Hydrocodone Itching    Health Maintenance  Topic Date Due   COVID-19 Vaccine (2 - Moderna risk series) 04/08/2020   Pneumococcal Vaccine 46-52 Years old (1 - PCV) 07/08/2021 (Originally [birth year]1984)   INFLUENZA VACCINE  08/19/2020   PAP SMEAR-Modifier  02/20/2022   TETANUS/TDAP  08/06/2027   Hepatitis C Screening  Completed   HIV Screening  Completed   HPV VACCINES  Aged Out    Chart Review Today: I personally reviewed active problem list, medication list, allergies, family history, social history, health maintenance, notes from last encounter, lab results, imaging with the patient/caregiver today.    Review of Systems  Constitutional: Negative.   HENT: Negative.    Eyes: Negative.   Respiratory:  Negative.    Cardiovascular: Negative.   Gastrointestinal: Negative.   Endocrine: Negative.   Genitourinary: Negative.   Musculoskeletal: Negative.   Skin: Negative.   Allergic/Immunologic: Negative.   Neurological: Negative.   Hematological: Negative.   Psychiatric/Behavioral: Negative.    All other systems reviewed and are negative.   Objective:   Vitals:   07/08/20 1549  BP: 132/80  Pulse: 74  Resp: 18  Temp: 97.8 F (36.6 C)  TempSrc: Oral  SpO2: 99%  Weight: (!) 328 lb 12.8 oz (149.1 kg)  Height: 5\' 10"  (1.778 m)    Body mass index is 47.18 kg/m.  Physical Exam Vitals and nursing note reviewed.  Constitutional:      General: She is not in acute distress.    Appearance: Normal appearance. She is well-developed. She is obese. She is not  ill-appearing, toxic-appearing or diaphoretic.     Interventions: Face mask in place.  HENT:     Head: Normocephalic and atraumatic.     Right Ear: External ear normal.     Left Ear: External ear normal.  Eyes:     General: Lids are normal. No scleral icterus.       Right eye: No discharge.        Left eye: No discharge.     Conjunctiva/sclera: Conjunctivae normal.  Neck:     Trachea: Phonation normal. No tracheal deviation.  Cardiovascular:     Rate and Rhythm: Normal rate and regular rhythm.     Pulses: Normal pulses.          Radial pulses are 2+ on the right side and 2+ on the left side.       Posterior tibial pulses are 2+ on the right side and 2+ on the left side.     Heart sounds: Normal heart sounds. No murmur heard.   No friction rub. No gallop.  Pulmonary:     Effort: Pulmonary effort is normal. No respiratory distress.     Breath sounds: Normal breath sounds. No stridor. No wheezing, rhonchi or rales.  Chest:     Chest wall: No tenderness.  Abdominal:     General: Bowel sounds are normal. There is no distension.     Palpations: Abdomen is soft.  Musculoskeletal:     Right lower leg: No edema.     Left  lower leg: No edema.  Skin:    General: Skin is warm and dry.     Coloration: Skin is not jaundiced or pale.     Findings: No rash.  Neurological:     Mental Status: She is alert.     Motor: No abnormal muscle tone.     Gait: Gait normal.  Psychiatric:        Mood and Affect: Mood normal.        Speech: Speech normal.        Behavior: Behavior normal.        Assessment & Plan:     ICD-10-CM   1. Essential hypertension  I10 triamterene-hydrochlorothiazide (DYAZIDE) 37.5-25 MG capsule    COMPLETE METABOLIC PANEL WITH GFR   stable, well controlled, BP at goal, good med compliance    2. Post-surgical hypothyroidism  E89.0 CBC with Differential/Platelet    TSH   rechech TSH, dose was adjusted in the last 6 months    3. Vitamin D deficiency  T61.4 COMPLETE METABOLIC PANEL WITH GFR    VITAMIN D 25 Hydroxy (Vit-D Deficiency, Fractures)    4. Morbid obesity with BMI of 40.0-44.9, adult ([facility])  E31.54 COMPLETE METABOLIC PANEL WITH GFR   Z68.41 Hemoglobin A1c   weight increased - BMI now 47    5. Genital herpes simplex, unspecified site  A60.00    refills sent in- only using with outbreaks, not using preventatively, no recent outbreaks, not currently sexually active    6. Iron deficiency anemia due to chronic blood loss  D50.0 CBC with Differential/Platelet    Iron, TIBC and Ferritin Panel   on iron supplement, last ferritin was in normal range, but H/H had declined, recheck labs today    7. Prediabetes  M08.67 COMPLETE METABOLIC PANEL WITH GFR    Hemoglobin A1c   recheck labs    8. Encounter for medication monitoring  Z51.81 CBC with Differential/Platelet    COMPLETE METABOLIC PANEL WITH GFR  Hemoglobin A1c    VITAMIN D 25 Hydroxy (Vit-D Deficiency, Fractures)    TSH    Iron, TIBC and Ferritin Panel      Keep upcoming CPE with PCP   Delsa Grana, PA-C 07/08/20 4:03 PM

## 2020-07-09 LAB — CBC WITH DIFFERENTIAL/PLATELET
Absolute Monocytes: 496 cells/uL (ref 200–950)
Basophils Absolute: 64 cells/uL (ref 0–200)
Basophils Relative: 0.8 %
Eosinophils Absolute: 120 cells/uL (ref 15–500)
Eosinophils Relative: 1.5 %
HCT: 35.5 % (ref 35.0–45.0)
Hemoglobin: 11.2 g/dL — ABNORMAL LOW (ref 11.7–15.5)
Lymphs Abs: 2784 cells/uL (ref 850–3900)
MCH: 24.2 pg — ABNORMAL LOW (ref 27.0–33.0)
MCHC: 31.5 g/dL — ABNORMAL LOW (ref 32.0–36.0)
MCV: 76.8 fL — ABNORMAL LOW (ref 80.0–100.0)
MPV: 10 fL (ref 7.5–12.5)
Monocytes Relative: 6.2 %
Neutro Abs: 4536 cells/uL (ref 1500–7800)
Neutrophils Relative %: 56.7 %
Platelets: 351 10*3/uL (ref 140–400)
RBC: 4.62 10*6/uL (ref 3.80–5.10)
RDW: 14.7 % (ref 11.0–15.0)
Total Lymphocyte: 34.8 %
WBC: 8 10*3/uL (ref 3.8–10.8)

## 2020-07-09 LAB — COMPLETE METABOLIC PANEL WITH GFR
AG Ratio: 1.3 (calc) (ref 1.0–2.5)
ALT: 12 U/L (ref 6–29)
AST: 19 U/L (ref 10–30)
Albumin: 4.3 g/dL (ref 3.6–5.1)
Alkaline phosphatase (APISO): 45 U/L (ref 31–125)
BUN/Creatinine Ratio: 12 (calc) (ref 6–22)
BUN: 15 mg/dL (ref 7–25)
CO2: 28 mmol/L (ref 20–32)
Calcium: 9.2 mg/dL (ref 8.6–10.2)
Chloride: 103 mmol/L (ref 98–110)
Creat: 1.21 mg/dL — ABNORMAL HIGH (ref 0.50–1.10)
GFR, Est African American: 64 mL/min/{1.73_m2} (ref >=60)
GFR, Est Non African American: 55 mL/min/{1.73_m2} — ABNORMAL LOW (ref >=60)
Globulin: 3.2 g/dL (calc) (ref 1.9–3.7)
Glucose, Bld: 98 mg/dL (ref 65–99)
Potassium: 3.8 mmol/L (ref 3.5–5.3)
Sodium: 139 mmol/L (ref 135–146)
Total Bilirubin: 0.3 mg/dL (ref 0.2–1.2)
Total Protein: 7.5 g/dL (ref 6.1–8.1)

## 2020-07-09 LAB — IRON,TIBC AND FERRITIN PANEL
%SAT: 12 % (calc) — ABNORMAL LOW (ref 16–45)
Ferritin: 16 ng/mL (ref 16–232)
Iron: 47 ug/dL (ref 40–190)
TIBC: 391 mcg/dL (calc) (ref 250–450)

## 2020-07-09 LAB — VITAMIN D 25 HYDROXY (VIT D DEFICIENCY, FRACTURES): Vit D, 25-Hydroxy: 19 ng/mL — ABNORMAL LOW (ref 30–100)

## 2020-07-09 LAB — HEMOGLOBIN A1C
Hgb A1c MFr Bld: 6 % of total Hgb — ABNORMAL HIGH (ref <5.7)
Mean Plasma Glucose: 126 mg/dL
eAG (mmol/L): 7 mmol/L

## 2020-07-09 LAB — TSH: TSH: 3.61 mIU/L

## 2020-07-10 ENCOUNTER — Other Ambulatory Visit: Payer: Self-pay | Admitting: Family Medicine

## 2020-07-10 DIAGNOSIS — E890 Postprocedural hypothyroidism: Secondary | ICD-10-CM

## 2020-07-10 DIAGNOSIS — D500 Iron deficiency anemia secondary to blood loss (chronic): Secondary | ICD-10-CM

## 2020-07-10 MED ORDER — DOCUSATE SODIUM 100 MG PO CAPS: 100.0000 mg | ORAL_CAPSULE | Freq: Every day | ORAL | 1 refills | Status: DC | PRN

## 2020-07-10 MED ORDER — VITAMIN D (ERGOCALCIFEROL) 1.25 MG (50000 UNIT) PO CAPS: 50000.0000 [IU] | ORAL_CAPSULE | ORAL | 0 refills | Status: DC

## 2020-07-10 MED ORDER — LEVOTHYROXINE SODIUM 200 MCG PO TABS
200.0000 ug | ORAL_TABLET | Freq: Every day | ORAL | 1 refills | Status: DC
Start: 2020-07-10 — End: 2021-02-08

## 2020-07-16 ENCOUNTER — Other Ambulatory Visit: Payer: Self-pay | Admitting: Family Medicine

## 2020-07-16 DIAGNOSIS — Z1231 Encounter for screening mammogram for malignant neoplasm of breast: Secondary | ICD-10-CM

## 2020-07-17 ENCOUNTER — Ambulatory Visit: Payer: 59 | Admitting: Family Medicine

## 2020-07-19 ENCOUNTER — Other Ambulatory Visit: Payer: Self-pay

## 2020-07-19 ENCOUNTER — Encounter: Payer: Self-pay | Admitting: Family Medicine

## 2020-07-19 ENCOUNTER — Ambulatory Visit (INDEPENDENT_AMBULATORY_CARE_PROVIDER_SITE_OTHER): Payer: 59 | Admitting: Podiatry

## 2020-07-19 ENCOUNTER — Encounter: Payer: Self-pay | Admitting: Podiatry

## 2020-07-19 DIAGNOSIS — M79674 Pain in right toe(s): Secondary | ICD-10-CM | POA: Diagnosis not present

## 2020-07-19 DIAGNOSIS — B351 Tinea unguium: Secondary | ICD-10-CM | POA: Diagnosis not present

## 2020-07-27 ENCOUNTER — Encounter: Payer: Self-pay | Admitting: Family Medicine

## 2020-08-02 NOTE — Progress Notes (Signed)
   Subjective: 40 y.o. female presenting today as a new patient for evaluation of pain and sensitivity with thickening and discoloration to the right great toenail.  She states that it is very tender to touch.  She denies a history of injury.  This is been present for several months now.  She would like to have it addressed and treated today.  Past Medical History:  Diagnosis Date   AMA (advanced maternal age) multigravida 33+, first trimester    Anemia    Dermatophytosis of foot    Fibroid    Goiter    Gross hematuria    Herpes, genital    Hypertension    Low grade squamous intraepithelial lesion (LGSIL)    Morbid obesity with BMI of 40.0-44.9, adult ([facility])    Polycystic ovarian syndrome    Polycystic ovaries    Vaginal Pap smear, abnormal    Vitamin D deficiency     Objective:  General: Well developed, nourished, in no acute distress, alert and oriented x3   Dermatology: Skin is warm dry and supple bilateral.  The right hallux nail plate appears to be sensitive with thickening and discoloration and dystrophy noted.  Associated tenderness to palpation  Vascular: Dorsalis Pedis artery and Posterior Tibial artery pedal pulses palpable. No lower extremity edema noted.   Neruologic: Grossly intact via light touch bilateral.  Musculoskeletal: No pedal deformity noted  Assessment:  Dystrophic nail right hallux nail plate with likely onychomycosis of the toenails  Plan of Care:  1. Patient evaluated.  2. Discussed treatment alternatives and plan of care. Explained nail avulsion procedure and post procedure course to patient. 3. Patient opted for total temporary nail avulsion of the right hallux nail plate.  4. Prior to procedure, local anesthesia infiltration utilized using 3 ml of a 50:50 mixture of 2% plain lidocaine and 0.5% plain marcaine in a normal hallux block fashion and a betadine prep performed.  Nail was avulsed in its entirety. 5. Light dressing applied. 6.  Antifungal  formula 3 topical was provided for the patient to apply when the nail begins to regrow  7.  Return to clinic as needed.   *Works for [employer]  Edrick Kins, DPM [facility]  Dr. Edrick Kins, DPM    [address], [place] [address]                Office [phone]  Fax [phone]

## 2020-08-13 ENCOUNTER — Ambulatory Visit: Payer: 59 | Admitting: Family Medicine

## 2020-09-05 NOTE — Progress Notes (Signed)
Name: Michelle Ware   MRN: [id number]    DOB: 06-06-1980   Date:09/05/2020       Progress Note  Subjective  Chief Complaint  Stomach Issues  I connected with  [name]  on 09/05/20 at  3:00 PM EDT by a video enabled telemedicine application and verified that I am speaking with the correct person using two identifiers.  I discussed the limitations of evaluation and management by telemedicine and the availability of in person appointments. The patient expressed understanding and agreed to proceed with the virtual visit  Staff also discussed with the patient that there may be a patient responsible charge related to this service. Patient Location: at parking lot at work  Provider Location: [facility] Additional Individuals present: alone   HPI  Stomach problems: she states her son had a GI bug about one week ago. She developed abdominal cramping and some loose stools for 24 hours, felt chills but no fever, denies associated nausea or vomiting. Those symptoms resolved, but after that she had a few days of pain on epigastric area after meals. Sometimes lower abdominal pain and gas/bloating. She took otc Gas X and states feeling better.   Vitamin D deficiency: advised to take otc vitamin D 2000 units after she finishes rx vitamin D   Iron deficiency anemia and heavy cycles: advised otc iron pills and we will discuss her heavy menses during her CPE 09/01  Patient Active Problem List   Diagnosis Date Noted   History of thyroidectomy 08/05/2017   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 of myomectomy and CHTN  09/28/2016   Tenosynovitis of wrist 05/21/2016   Morbid obesity with BMI of 40.0-44.9, adult ([place]) 03/31/2016   H/O myomectomy 03/11/2015   Essential hypertension 11/28/2014   Obesity, Class III, BMI 40-49.9 (morbid obesity) ([place]) 11/28/2014   Decreased thyroid stimulating hormone (TSH) level 07/24/2014   Hypertrichosis 07/24/2014   Vitamin D  deficiency 07/24/2014   PCOS (polycystic ovarian syndrome) 07/24/2014   History of abnormal cervical Pap smear 11/24/2011   Genital herpes 11/29/2008    Past Surgical History:  Procedure Laterality Date   BIOPSY THYROID  07/2015   Dr. Gabriel Carina   CESAREAN SECTION N/A 09/28/2016   Procedure: Primary CESAREAN SECTION;  Surgeon: Servando Salina, MD;  Location: [facility];  Service: Obstetrics;  Laterality: N/A;  EDD: 10/19/16 Allergy: Hydrocodone-Acetaminophen   LAPAROSCOPIC GELPORT ASSISTED MYOMECTOMY N/A 03/11/2015   Procedure: LAPAROSCOPIC  MYOMECTOMY--attempted;  Surgeon: Rubie Maid, MD;  Location: [facility];  Service: Gynecology;  Laterality: N/A;   LAPAROTOMY N/A 03/11/2015   Procedure: LAPAROTOMY--MYOMECTOMY;  Surgeon: Rubie Maid, MD;  Location: [facility];  Service: Gynecology;  Laterality: N/A;   MOUTH SURGERY  1996   TOTAL THYROIDECTOMY Bilateral 03/05/2017   Dr. Maudie Mercury [facility]    Family History  Problem Relation Age of Onset   Thyroid disease Mother    Hypertension Mother    Cancer Maternal Aunt    Cancer Maternal Grandfather    Prostate cancer Maternal Grandfather    Stroke Maternal Grandmother    Breast cancer Paternal Grandmother    Heart failure Paternal Grandfather    Liver disease Brother        Fatty Liver    Social History   Socioeconomic History   Marital status: Single    Spouse name: Not on file   Number of children: 1   Years of education: Not on file   Highest education level: Bachelor's degree (e.g.,  BA, AB, BS)  Occupational History   Occupation: Glass blower/designer    Comment: works for [employer] in [employer]  Tobacco Use   Smoking status: Never   Smokeless tobacco: Never  Vaping Use   Vaping Use: Never used  Substance and Sexual Activity   Alcohol use: Yes    Comment: occasionally, about once a month   Drug use: No   Sexual activity: Not Currently    Partners: Male    Birth control/protection: Condom  Other Topics Concern   Not on  file  Social History Narrative   Lives by herself, broke up with Carloyn Manner, but they had a son 09/28/2016 and he is involved in their son's life   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   Social Determinants of Health   Financial Resource Strain: Not on file  Food Insecurity: Not on file  Transportation Needs: Not on file  Physical Activity: Not on file  Stress: Not on file  Social Connections: Not on file  Intimate Partner Violence: Not on file     Current Outpatient Medications:    docusate sodium (COLACE) 100 MG capsule, Take 1 capsule (100 mg total) by mouth daily as needed for mild constipation (for iron supplement side effects)., Disp: 90 capsule, Rfl: 1   ferrous sulfate 325 (65 FE) MG EC tablet, Take 1 tablet (325 mg total) by mouth 3 (three) times daily with meals., Disp: 270 tablet, Rfl: 0   levothyroxine (SYNTHROID) 200 MCG tablet, Take 1 tablet (200 mcg total) by mouth daily before breakfast. Take an extra half pill 3 x  weekly (mon, wed, fri for total of 300 mcg), Disp: 111 tablet, Rfl: 1   triamterene-hydrochlorothiazide (DYAZIDE) 37.5-25 MG capsule, Take 1 each (1 capsule total) by mouth daily., Disp: 90 capsule, Rfl: 1   valACYclovir (VALTREX) 500 MG tablet, TAKE 1 TABLET BY MOUTH 2 TIMES DAILY FOR OUTBREAKS AND ONCE DAILY FOR PREVENTION AS DIRECTED, Disp: 60 tablet, Rfl: 3   Vitamin D, Ergocalciferol, (DRISDOL) 1.25 MG (50000 UNIT) CAPS capsule, Take 1 capsule (50,000 Units total) by mouth every 7 (seven) days., Disp: 12 capsule, Rfl: 0  Allergies  Allergen Reactions   Hydrocodone Itching    I personally reviewed active problem list, medication list, allergies with the patient/caregiver today.   ROS  Ten systems reviewed and is negative except as mentioned in HPI   Objective  Virtual encounter, vitals not obtained.  There is no height or weight on file to calculate BMI.  Physical Exam  Awake, alert and oriented no distress    PHQ2/9: Depression screen [facility] 2/9 07/08/2020 12/27/2019 12/22/2019 07/21/2019 02/03/2019  Decreased Interest 0 0 0 0 0  Down, Depressed, Hopeless 0 0 1 0 0  PHQ - 2 Score 0 0 1 0 0  Altered sleeping - - - 0 0  Tired, decreased energy - - - 0 0  Change in appetite - - - 0 0  Feeling bad or failure about yourself  - - - 0 0  Trouble concentrating - - - 0 0  Moving slowly or fidgety/restless - - - 0 0  Suicidal thoughts - - - 0 0  PHQ-9 Score - - - 0 0  Difficult doing work/chores - - - - -  Some recent data might be hidden   PHQ-2/9 Result is negative.    Fall Risk: Fall Risk  07/08/2020 12/27/2019 12/22/2019 07/21/2019 02/03/2019  Falls in the past year? 0  0 0 0 0  Number falls in past yr: 0 0 0 0 0  Injury with Fall? 0 0 0 0 0  Follow up Falls evaluation completed - - - -     Assessment & Plan  1. Generalized abdominal pain  Explained since better and triggered by an episode of infectious diarrhea, she can try probiotics and continue gas x, eat a bland diet and let me know if no resolution of symptoms by next visit   2. Iron deficiency anemia due to chronic blood loss  We need to decrease cycle volume, she is coming back for a CPE  3. Vitamin D deficiency  Once finished with rx vitamin D start otc Vitamin D 2000 units daily    I discussed the assessment and treatment plan with the patient. The patient was provided an opportunity to ask questions and all were answered. The patient agreed with the plan and demonstrated an understanding of the instructions.  The patient was advised to call back or seek an in-person evaluation if the symptoms worsen or if the condition fails to improve as anticipated.  I provided 15  minutes of non-face-to-face time during this encounter.

## 2020-09-09 ENCOUNTER — Encounter: Payer: Self-pay | Admitting: Family Medicine

## 2020-09-09 ENCOUNTER — Telehealth (INDEPENDENT_AMBULATORY_CARE_PROVIDER_SITE_OTHER): Payer: 59 | Admitting: Family Medicine

## 2020-09-09 DIAGNOSIS — D500 Iron deficiency anemia secondary to blood loss (chronic): Secondary | ICD-10-CM | POA: Diagnosis not present

## 2020-09-09 DIAGNOSIS — E559 Vitamin D deficiency, unspecified: Secondary | ICD-10-CM | POA: Diagnosis not present

## 2020-09-09 DIAGNOSIS — R1084 Generalized abdominal pain: Secondary | ICD-10-CM

## 2020-09-12 ENCOUNTER — Other Ambulatory Visit: Payer: Self-pay

## 2020-09-12 ENCOUNTER — Ambulatory Visit
Admission: RE | Admit: 2020-09-12 | Discharge: 2020-09-12 | Disposition: A | Discharge status: Home / Self Care | Payer: 59 | Source: Ambulatory Visit | Attending: Family Medicine | Admitting: Family Medicine

## 2020-09-12 DIAGNOSIS — Z1231 Encounter for screening mammogram for malignant neoplasm of breast: Secondary | ICD-10-CM | POA: Insufficient documentation

## 2020-09-18 NOTE — Patient Instructions (Signed)
Preventive Care 42-42 Years Old, Female Preventive care refers to lifestyle choices and visits with your health care provider that can promote health and wellness. This includes: A yearly physical exam. This is also called an annual wellness visit. Regular dental and eye exams. Immunizations. Screening for certain conditions. Healthy lifestyle choices, such as: Eating a healthy diet. Getting regular exercise. Not using drugs or products that contain nicotine and tobacco. Limiting alcohol use. What can I expect for my preventive care visit? Physical exam Your health care provider will check your: Height and weight. These may be used to calculate your BMI (body mass index). BMI is a measurement that tells if you are at a healthy weight. Heart rate and blood pressure. Body temperature. Skin for abnormal spots. Counseling Your health care provider may ask you questions about your: Past medical problems. Family's medical history. Alcohol, tobacco, and drug use. Emotional well-being. Home life and relationship well-being. Sexual activity. Diet, exercise, and sleep habits. Work and work environment. Access to firearms. Method of birth control. Menstrual cycle. Pregnancy history. What immunizations do I need? Vaccines are usually given at various ages, according to a schedule. Your health care provider will recommend vaccines for you based on your age, medical history, and lifestyle or other factors, such as travel or where you work. What tests do I need? Blood tests Lipid and cholesterol levels. These may be checked every 5 years, or more often if you are over 42 years old. Hepatitis C test. Hepatitis B test. Screening Lung cancer screening. You may have this screening every year starting at age 55 if you have a 30-pack-year history of smoking and currently smoke or have quit within the past 15 years. Colorectal cancer screening. All adults should have this screening starting at  age 42 and continuing until age 75. Your health care provider may recommend screening at age 45 if you are at increased risk. You will have tests every 1-10 years, depending on your results and the type of screening test. Diabetes screening. This is done by checking your blood sugar (glucose) after you have not eaten for a while (fasting). You may have this done every 1-3 years. Mammogram. This may be done every 1-2 years. Talk with your health care provider about when you should start having regular mammograms. This may depend on whether you have a family history of breast cancer. BRCA-related cancer screening. This may be done if you have a family history of breast, ovarian, tubal, or peritoneal cancers. Pelvic exam and Pap test. This may be done every 3 years starting at age 21. Starting at age 30, this may be done every 5 years if you have a Pap test in combination with an HPV test. Other tests STD (sexually transmitted disease) testing, if you are at risk. Bone density scan. This is done to screen for osteoporosis. You may have this scan if you are at high risk for osteoporosis. Talk with your health care provider about your test results, treatment options, and if necessary, the need for more tests. Follow these instructions at home: Eating and drinking  Eat a diet that includes fresh fruits and vegetables, whole grains, lean protein, and low-fat dairy products. Take vitamin and mineral supplements as recommended by your health care provider. Do not drink alcohol if: Your health care provider tells you not to drink. You are pregnant, may be pregnant, or are planning to become pregnant. If you drink alcohol: Limit how much you have to 0-1 drink a day. Be   aware of how much alcohol is in your drink. In the [place] one drink equals one 12 oz bottle of beer (355 mL), one 5 oz glass of wine (148 mL), or one 1 oz glass of hard liquor (44 mL). Lifestyle Take daily care of your teeth and  gums. Brush your teeth every morning and night with fluoride toothpaste. Floss one time each day. Stay active. Exercise for at least 30 minutes 5 or more days each week. Do not use any products that contain nicotine or tobacco, such as cigarettes, e-cigarettes, and chewing tobacco. If you need help quitting, ask your health care provider. Do not use drugs. If you are sexually active, practice safe sex. Use a condom or other form of protection to prevent STIs (sexually transmitted infections). If you do not wish to become pregnant, use a form of birth control. If you plan to become pregnant, see your health care provider for a prepregnancy visit. If told by your health care provider, take low-dose aspirin daily starting at age 63. Find healthy ways to cope with stress, such as: Meditation, yoga, or listening to music. Journaling. Talking to a trusted person. Spending time with friends and family. Safety Always wear your seat belt while driving or riding in a vehicle. Do not drive: If you have been drinking alcohol. Do not ride with someone who has been drinking. When you are tired or distracted. While texting. Wear a helmet and other protective equipment during sports activities. If you have firearms in your house, make sure you follow all gun safety procedures. What's next? Visit your health care provider once a year for an annual wellness visit. Ask your health care provider how often you should have your eyes and teeth checked. Stay up to date on all vaccines. This information is not intended to replace advice given to you by your health care provider. Make sure you discuss any questions you have with your health care provider. Document Revised: 03/15/2020 Document Reviewed: 09/16/2017 Elsevier Patient Education  2022 [employer].

## 2020-09-18 NOTE — Progress Notes (Signed)
Name: Michelle Ware   MRN: [id number]    DOB: [birth year]   Date:09/19/2020       Progress Note  Subjective  Chief Complaint  Annual Exam  HPI  Patient presents for annual CPE.  Diet: she has been packing her lunch and stopped drinking sodas , smaller portions  Exercise: 150 minutes per week, she just hired a Actuary so she can go to the gym a couple of times a week.    [place] Visit from 07/08/2020 in [facility]  AUDIT-C Score 0      Depression: Phq 9 is  negative Depression screen [facility] 2/9 09/19/2020 09/09/2020 07/08/2020 12/27/2019 12/22/2019  Decreased Interest 0 0 0 0 0  Down, Depressed, Hopeless 0 0 0 0 1  PHQ - 2 Score 0 0 0 0 1  Altered sleeping - 0 - - -  Tired, decreased energy - 0 - - -  Change in appetite - 0 - - -  Feeling bad or failure about yourself  - 0 - - -  Trouble concentrating - 0 - - -  Moving slowly or fidgety/restless - 0 - - -  Suicidal thoughts - 0 - - -  PHQ-9 Score - 0 - - -  Difficult doing work/chores - Not difficult at all - - -  Some recent data might be hidden   Hypertension: BP Readings from Last 3 Encounters:  09/19/20 120/78  07/08/20 132/80  01/04/20 140/78   Obesity: Wt Readings from Last 3 Encounters:  09/19/20 (!) 327 lb (148.3 kg)  07/08/20 (!) 328 lb 12.8 oz (149.1 kg)  01/04/20 (!) 331 lb 6 oz (150.3 kg)   BMI Readings from Last 3 Encounters:  09/19/20 46.92 kg/m  07/08/20 47.18 kg/m  01/04/20 47.55 kg/m     Vaccines:   Pneumonia: educated and discussed with patient. Flu: educated and discussed with patient.  Hep C Screening: 12/27/17 STD testing and prevention (HIV/chl/gon/syphilis): 09/28/16 Intimate partner violence:negative Sexual History : not sexually active since 2019  Menstrual History/LMP/Abnormal Bleeding: first cycle at age 11 yo, she had oligomenorrhea for a few years as a teenager, regular after that and used to last 3 days and not very heavy , until the delivery of her child  in 2018, cycles are 5 days long and heavy now. She has been anemic. She has a history fibroid surgery prior to pregnancy  Incontinence Symptoms: mild symptoms has urgency when her bladder is full   Breast cancer:  - Last Mammogram: up to date  - BRCA gene screening: N/A  Osteoporosis: Discussed high calcium and vitamin D supplementation, weight bearing exercises  Cervical cancer screening: today   Skin cancer: Discussed monitoring for atypical lesions  Colorectal cancer: N/A   Lung cancer: Low Dose CT Chest recommended if Age 27-80 years, 20 pack-year currently smoking OR have quit w/in 15years. Patient does not qualify.   ECG: 01/05/20  Advanced Care Planning: A voluntary discussion about advance care planning including the explanation and discussion of advance directives.  Discussed health care proxy and Living will, and the patient was able to identify a health care proxy as Hoy Morn and Mardene Celeste - parents   Lipids: Lab Results  Component Value Date   CHOL 131 12/22/2019   CHOL 144 12/30/2018   CHOL 136 12/27/2017   Lab Results  Component Value Date   HDL 56 12/22/2019   HDL 52 12/30/2018   HDL 51 12/27/2017   Lab Results  Component Value Date  LDLCALC 60 12/22/2019   LDLCALC 78 12/30/2018   LDLCALC 70 12/27/2017   Lab Results  Component Value Date   TRIG 70 12/22/2019   TRIG 56 12/30/2018   TRIG 70 12/27/2017   Lab Results  Component Value Date   CHOLHDL 2.3 12/22/2019   CHOLHDL 2.8 12/30/2018   CHOLHDL 2.7 12/27/2017   No results found for: LDLDIRECT  Glucose: Glucose, Bld  Date Value Ref Range Status  07/08/2020 98 65 - 99 mg/dL Final    Comment:    .            Fasting reference interval .   12/22/2019 132 (H) 65 - 99 mg/dL Final    Comment:    .            Fasting reference interval . For someone without known diabetes, a glucose value >125 mg/dL indicates that they may have diabetes and this should be confirmed with a follow-up test. .    12/30/2018 89 65 - 99 mg/dL Final    Comment:    .            Fasting reference interval .     Patient Active Problem List   Diagnosis Date Noted   History of thyroidectomy 08/05/2017   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 of myomectomy and CHTN  09/28/2016   Tenosynovitis of wrist 05/21/2016   Morbid obesity with BMI of 40.0-44.9, adult (HCC) 03/31/2016   H/O myomectomy 03/11/2015   Essential hypertension 11/28/2014   Obesity, Class III, BMI 40-49.9 (morbid obesity) (HCC) 11/28/2014   Hypertrichosis 07/24/2014   Vitamin D deficiency 07/24/2014   PCOS (polycystic ovarian syndrome) 07/24/2014   History of abnormal cervical Pap smear 11/24/2011   Genital herpes 11/29/2008    Past Surgical History:  Procedure Laterality Date   BIOPSY THYROID  07/2015   Dr. Solum   CESAREAN SECTION N/A 09/28/2016   Procedure: Primary CESAREAN SECTION;  Surgeon: Cousins, Sheronette, MD;  Location: [facility];  Service: Obstetrics;  Laterality: N/A;  EDD: 10/19/16 Allergy: Hydrocodone-Acetaminophen   LAPAROSCOPIC GELPORT ASSISTED MYOMECTOMY N/A 03/11/2015   Procedure: LAPAROSCOPIC  MYOMECTOMY--attempted;  Surgeon: Anika Cherry, MD;  Location: [facility];  Service: Gynecology;  Laterality: N/A;   LAPAROTOMY N/A 03/11/2015   Procedure: LAPAROTOMY--MYOMECTOMY;  Surgeon: Anika Cherry, MD;  Location: [facility] OR[facility];  Service: Gynecology;  Laterality: N/A;   MOUTH SURGERY  1996   TOTAL THYROIDECTOMY Bilateral 03/05/2017   Dr. Kim - [facility]    Family History  Problem Relation Age of Onset   Thyroid disease Mother    Hypertension Mother    Cancer Maternal Aunt    Cancer Maternal Grandfather    Prostate cancer Maternal Grandfather    Stroke Maternal Grandmother    Breast cancer Paternal Grandmother    Heart failure Paternal Grandfather    Liver disease Brother        Fatty Liver    Social History   Socioeconomic History   Marital status: Single     Spouse name: Not on file   Number of children: 1   Years of education: Not on file   Highest education level: Bachelor's degree (e.g., BA, AB, BS)  Occupational History   Occupation: machine operator    Comment: works for [employer] in [place]  Tobacco Use   Smoking status: Never   Smokeless tobacco: Never  Vaping Use   Vaping Use: Never used  Substance and Sexual Activity   Alcohol   use: Yes    Comment: occasionally, about once a month   Drug use: No   Sexual activity: Not Currently    Partners: Male    Birth control/protection: Condom  Other Topics Concern   Not on file  Social History Narrative   Lives by herself, broke up with Roy, but they had a son 09/28/2016 and he is involved in their son's life   Working full time at [employer] ( [employer]), works at the factory side.   Social Determinants of Health   Financial Resource Strain: Low Risk    Difficulty of Paying Living Expenses: Not hard at all  Food Insecurity: No Food Insecurity   Worried About Running Out of Food in the Last Year: Never true   Ran Out of Food in the Last Year: Never true  Transportation Needs: No Transportation Needs   Lack of Transportation (Medical): No   Lack of Transportation (Non-Medical): No  Physical Activity: Inactive   Days of Exercise per Week: 0 days   Minutes of Exercise per Session: 0 min  Stress: No Stress Concern Present   Feeling of Stress : Not at all  Social Connections: Moderately Isolated   Frequency of Communication with Friends and Family: More than three times a week   Frequency of Social Gatherings with Friends and Family: Once a week   Attends Religious Services: More than 4 times per year   Active Member of Clubs or Organizations: No   Attends Club or Organization Meetings: Never   Marital Status: Never married  Intimate Partner Violence: Not At Risk   Fear of Current or Ex-Partner: No   Emotionally Abused: No   Physically Abused: No   Sexually Abused: No      Current Outpatient Medications:    docusate sodium (COLACE) 100 MG capsule, Take 1 capsule (100 mg total) by mouth daily as needed for mild constipation (for iron supplement side effects)., Disp: 90 capsule, Rfl: 1   ferrous sulfate 325 (65 FE) MG EC tablet, Take 1 tablet (325 mg total) by mouth 3 (three) times daily with meals., Disp: 270 tablet, Rfl: 0   levothyroxine (SYNTHROID) 200 MCG tablet, Take 1 tablet (200 mcg total) by mouth daily before breakfast. Take an extra half pill 3 x  weekly (mon, wed, fri for total of 300 mcg), Disp: 111 tablet, Rfl: 1   triamterene-hydrochlorothiazide (DYAZIDE) 37.5-25 MG capsule, Take 1 each (1 capsule total) by mouth daily., Disp: 90 capsule, Rfl: 1   valACYclovir (VALTREX) 500 MG tablet, TAKE 1 TABLET BY MOUTH 2 TIMES DAILY FOR OUTBREAKS AND ONCE DAILY FOR PREVENTION AS DIRECTED, Disp: 60 tablet, Rfl: 3   Vitamin D, Ergocalciferol, (DRISDOL) 1.25 MG (50000 UNIT) CAPS capsule, Take 1 capsule (50,000 Units total) by mouth every 7 (seven) days., Disp: 12 capsule, Rfl: 0  Allergies  Allergen Reactions   Hydrocodone Itching     ROS  Constitutional: Negative for fever or weight change.  Respiratory: Negative for cough and shortness of breath.   Cardiovascular: Negative for chest pain or palpitations.  Gastrointestinal: Negative for abdominal pain, no bowel changes.  Musculoskeletal: Negative for gait problem or joint swelling.  Skin: Negative for rash.  Neurological: Negative for dizziness or headache.  No other specific complaints in a complete review of systems (except as listed in HPI above).   Objective  Vitals:   09/19/20 0759  BP: 120/78  Pulse: 84  Resp: 16  Temp: 98 F (36.7 C)  SpO2: 98%  Weight: (!)   327 lb (148.3 kg)  Height: 5' 10" (1.778 m)    Body mass index is 46.92 kg/m.  Physical Exam  Constitutional: Patient appears well-developed and well-nourished. No distress.   HENT: Head: Normocephalic and atraumatic. Ears:  B TMs ok, no erythema or effusion; Nose: Nose normal. Mouth/Throat: not done  Eyes: Conjunctivae and EOM are normal. Pupils are equal, round, and reactive to light. No scleral icterus.  Neck: Normal range of motion. Neck supple. No JVD present. No thyromegaly present.  Cardiovascular: Normal rate, regular rhythm and normal heart sounds.  No murmur heard. No BLE edema. Pulmonary/Chest: Effort normal and breath sounds normal. No respiratory distress. Abdominal: Soft. Bowel sounds are normal, no distension. There is no tenderness. no masses Breast: no lumps or masses, no nipple discharge or rashes FEMALE GENITALIA:  External genitalia normal External urethra normal Vaginal vault normal without discharge or lesions Cervix normal without discharge or lesions Bimanual exam normal without masses RECTAL: not done  Musculoskeletal: Normal range of motion, no joint effusions. No gross deformities Neurological: he is alert and oriented to person, place, and time. No cranial nerve deficit. Coordination, balance, strength, speech and gait are normal.  Skin: Skin is warm and dry. No rash noted. No erythema. Hypertrichosis  Psychiatric: Patient has a normal mood and affect. behavior is normal. Judgment and thought content normal.   Recent Results (from the past 2160 hour(s))  CBC with Differential/Platelet     Status: Abnormal   Collection Time: 07/08/20  4:04 PM  Result Value Ref Range   WBC 8.0 3.8 - 10.8 Thousand/uL   RBC 4.62 3.80 - 5.10 Million/uL   Hemoglobin 11.2 (L) 11.7 - 15.5 g/dL   HCT 35.5 35.0 - 45.0 %   MCV 76.8 (L) 80.0 - 100.0 fL   MCH 24.2 (L) 27.0 - 33.0 pg   MCHC 31.5 (L) 32.0 - 36.0 g/dL   RDW 14.7 11.0 - 15.0 %   Platelets 351 140 - 400 Thousand/uL   MPV 10.0 7.5 - 12.5 fL   Neutro Abs 4,536 1,500 - 7,800 cells/uL   Lymphs Abs 2,784 850 - 3,900 cells/uL   Absolute Monocytes 496 200 - 950 cells/uL   Eosinophils Absolute 120 15 - 500 cells/uL   Basophils Absolute 64 0 - 200  cells/uL   Neutrophils Relative % 56.7 %   Total Lymphocyte 34.8 %   Monocytes Relative 6.2 %   Eosinophils Relative 1.5 %   Basophils Relative 0.8 %  COMPLETE METABOLIC PANEL WITH GFR     Status: Abnormal   Collection Time: 07/08/20  4:04 PM  Result Value Ref Range   Glucose, Bld 98 65 - 99 mg/dL    Comment: .            Fasting reference interval .    BUN 15 7 - 25 mg/dL   Creat 1.21 (H) 0.50 - 1.10 mg/dL   GFR, Est Non African American 55 (L) > OR = 60 mL/min/1.73m2   GFR, Est African American 64 > OR = 60 mL/min/1.73m2   BUN/Creatinine Ratio 12 6 - 22 (calc)   Sodium 139 135 - 146 mmol/L   Potassium 3.8 3.5 - 5.3 mmol/L   Chloride 103 98 - 110 mmol/L   CO2 28 20 - 32 mmol/L   Calcium 9.2 8.6 - 10.2 mg/dL   Total Protein 7.5 6.1 - 8.1 g/dL   Albumin 4.3 3.6 - 5.1 g/dL   Globulin 3.2 1.9 - 3.7 g/dL (calc)   AG   Ratio 1.3 1.0 - 2.5 (calc)   Total Bilirubin 0.3 0.2 - 1.2 mg/dL   Alkaline phosphatase (APISO) 45 31 - 125 U/L   AST 19 10 - 30 U/L   ALT 12 6 - 29 U/L  Hemoglobin A1c     Status: Abnormal   Collection Time: 07/08/20  4:04 PM  Result Value Ref Range   Hgb A1c MFr Bld 6.0 (H) <5.7 % of total Hgb    Comment: For someone without known diabetes, a hemoglobin  A1c value between 5.7% and 6.4% is consistent with prediabetes and should be confirmed with a  follow-up test. . For someone with known diabetes, a value <7% indicates that their diabetes is well controlled. A1c targets should be individualized based on duration of diabetes, age, comorbid conditions, and other considerations. . This assay result is consistent with an increased risk of diabetes. . Currently, no consensus exists regarding use of hemoglobin A1c for diagnosis of diabetes for children. .    Mean Plasma Glucose 126 mg/dL   eAG (mmol/L) 7.0 mmol/L  VITAMIN D 25 Hydroxy (Vit-D Deficiency, Fractures)     Status: Abnormal   Collection Time: 07/08/20  4:04 PM  Result Value Ref Range   Vit D,  25-Hydroxy 19 (L) 30 - 100 ng/mL    Comment: Vitamin D Status         25-OH Vitamin D: . Deficiency:                    <20 ng/mL Insufficiency:             20 - 29 ng/mL Optimal:                 > or = 30 ng/mL . For 25-OH Vitamin D testing on patients on  D2-supplementation and patients for whom quantitation  of D2 and D3 fractions is required, the [facility](TM) 25-OH VIT D, (D2,D3), LC/MS/MS is recommended: order  code [phone] (patients >58yr). See Note 1 . Note 1 . For additional information, please refer to  http://education.[facility].com/faq/FAQ199  (This link is being provided for informational/ educational purposes only.)   TSH     Status: None   Collection Time: 07/08/20  4:04 PM  Result Value Ref Range   TSH 3.61 mIU/L    Comment:           Reference Range .           > or = 20 Years  0.40-4.50 .                Pregnancy Ranges           First trimester    0.26-2.66           Second trimester   0.55-2.73           Third trimester    0.43-2.91   Iron, TIBC and Ferritin Panel     Status: Abnormal   Collection Time: 07/08/20  4:04 PM  Result Value Ref Range   Iron 47 40 - 190 mcg/dL   TIBC 391 250 - 450 mcg/dL (calc)   %SAT 12 (L) 16 - 45 % (calc)   Ferritin 16 16 - 232 ng/mL    Fall Risk: Fall Risk  09/19/2020 09/09/2020 07/08/2020 12/27/2019 12/22/2019  Falls in the past year? 0 0 0 0 0  Number falls in past yr: 0 0 0 0 0  Injury with Fall? 0 0 0 0 0  Risk for  fall due to : No Fall Risks - - - -  Follow up Falls prevention discussed - Falls evaluation completed - -     Functional Status Survey: Is the patient deaf or have difficulty hearing?: No Does the patient have difficulty seeing, even when wearing glasses/contacts?: No Does the patient have difficulty concentrating, remembering, or making decisions?: No Does the patient have difficulty walking or climbing stairs?: No Does the patient have difficulty dressing or bathing?: No Does the patient  have difficulty doing errands alone such as visiting a doctor's office or shopping?: No   Assessment & Plan  1. Well adult exam  - Lipid panel - Microalbumin / creatinine urine ratio - COMPLETE METABOLIC PANEL WITH GFR - CBC with Differential/Platelet - Hemoglobin A1c - TSH - Cytology - PAP  2. Stage 3a chronic kidney disease (HCC)  - Microalbumin / creatinine urine ratio - COMPLETE METABOLIC PANEL WITH GFR - CBC with Differential/Platelet  3. Iron deficiency anemia due to chronic blood loss  - CBC with Differential/Platelet - Iron, TIBC and Ferritin Panel  4. History of thyroidectomy  - TSH  5. Menorrhagia with regular cycle  We will start ortho evra patch  6. Lipid screening  - Lipid panel  7. Prediabetes  - Hemoglobin A1c   -USPSTF grade A and B recommendations reviewed with patient; age-appropriate recommendations, preventive care, screening tests, etc discussed and encouraged; healthy living encouraged; see AVS for patient education given to patient -Discussed importance of 150 minutes of physical activity weekly, eat two servings of fish weekly, eat one serving of tree nuts ( cashews, pistachios, pecans, almonds..) every other day, eat 6 servings of fruit/vegetables daily and drink plenty of water and avoid sweet beverages.    

## 2020-09-19 ENCOUNTER — Other Ambulatory Visit (HOSPITAL_COMMUNITY)
Admission: RE | Admit: 2020-09-19 | Discharge: 2020-09-19 | Disposition: A | Discharge status: Home / Self Care | Payer: 59 | Source: Ambulatory Visit | Attending: Family Medicine | Admitting: Family Medicine

## 2020-09-19 ENCOUNTER — Ambulatory Visit (INDEPENDENT_AMBULATORY_CARE_PROVIDER_SITE_OTHER): Payer: 59 | Admitting: Family Medicine

## 2020-09-19 ENCOUNTER — Encounter: Payer: Self-pay | Admitting: Family Medicine

## 2020-09-19 ENCOUNTER — Other Ambulatory Visit: Payer: Self-pay

## 2020-09-19 VITALS — BP 120/78 | HR 84 | Temp 98.0°F | Resp 16 | Ht 70.0 in | Wt 327.0 lb

## 2020-09-19 DIAGNOSIS — E890 Postprocedural hypothyroidism: Secondary | ICD-10-CM | POA: Diagnosis not present

## 2020-09-19 DIAGNOSIS — Z0000 Encounter for general adult medical examination without abnormal findings: Secondary | ICD-10-CM | POA: Diagnosis present

## 2020-09-19 DIAGNOSIS — N1831 Chronic kidney disease, stage 3a: Secondary | ICD-10-CM

## 2020-09-19 DIAGNOSIS — R7303 Prediabetes: Secondary | ICD-10-CM

## 2020-09-19 DIAGNOSIS — D500 Iron deficiency anemia secondary to blood loss (chronic): Secondary | ICD-10-CM

## 2020-09-19 DIAGNOSIS — Z13220 Encounter for screening for lipoid disorders: Secondary | ICD-10-CM

## 2020-09-19 DIAGNOSIS — N920 Excessive and frequent menstruation with regular cycle: Secondary | ICD-10-CM

## 2020-09-19 MED ORDER — NORELGESTROMIN-ETH ESTRADIOL 150-35 MCG/24HR TD PTWK: 1.0000 | MEDICATED_PATCH | TRANSDERMAL | 3 refills | Status: DC

## 2020-09-20 LAB — IRON,TIBC AND FERRITIN PANEL
%SAT: 19 % (calc) (ref 16–45)
Ferritin: 17 ng/mL (ref 16–232)
Iron: 72 ug/dL (ref 40–190)
TIBC: 389 mcg/dL (calc) (ref 250–450)

## 2020-09-20 LAB — MICROALBUMIN / CREATININE URINE RATIO
Creatinine, Urine: 190 mg/dL (ref 20–275)
Microalb Creat Ratio: 3 mcg/mg creat (ref <30)
Microalb, Ur: 0.5 mg/dL

## 2020-09-20 LAB — COMPLETE METABOLIC PANEL WITH GFR
AG Ratio: 1.5 (calc) (ref 1.0–2.5)
ALT: 10 U/L (ref 6–29)
AST: 19 U/L (ref 10–30)
Albumin: 4.3 g/dL (ref 3.6–5.1)
Alkaline phosphatase (APISO): 42 U/L (ref 31–125)
BUN: 15 mg/dL (ref 7–25)
CO2: 25 mmol/L (ref 20–32)
Calcium: 9.1 mg/dL (ref 8.6–10.2)
Chloride: 106 mmol/L (ref 98–110)
Creat: 0.9 mg/dL (ref 0.50–0.99)
Globulin: 2.9 g/dL (calc) (ref 1.9–3.7)
Glucose, Bld: 95 mg/dL (ref 65–99)
Potassium: 4 mmol/L (ref 3.5–5.3)
Sodium: 141 mmol/L (ref 135–146)
Total Bilirubin: 0.4 mg/dL (ref 0.2–1.2)
Total Protein: 7.2 g/dL (ref 6.1–8.1)
eGFR: 82 mL/min/{1.73_m2} (ref >=60)

## 2020-09-20 LAB — CBC WITH DIFFERENTIAL/PLATELET
Absolute Monocytes: 450 cells/uL (ref 200–950)
Basophils Absolute: 42 cells/uL (ref 0–200)
Basophils Relative: 0.7 %
Eosinophils Absolute: 60 cells/uL (ref 15–500)
Eosinophils Relative: 1 %
HCT: 35.7 % (ref 35.0–45.0)
Hemoglobin: 11.2 g/dL — ABNORMAL LOW (ref 11.7–15.5)
Lymphs Abs: 2148 cells/uL (ref 850–3900)
MCH: 24.3 pg — ABNORMAL LOW (ref 27.0–33.0)
MCHC: 31.4 g/dL — ABNORMAL LOW (ref 32.0–36.0)
MCV: 77.6 fL — ABNORMAL LOW (ref 80.0–100.0)
MPV: 9.8 fL (ref 7.5–12.5)
Monocytes Relative: 7.5 %
Neutro Abs: 3300 cells/uL (ref 1500–7800)
Neutrophils Relative %: 55 %
Platelets: 317 10*3/uL (ref 140–400)
RBC: 4.6 10*6/uL (ref 3.80–5.10)
RDW: 14.7 % (ref 11.0–15.0)
Total Lymphocyte: 35.8 %
WBC: 6 10*3/uL (ref 3.8–10.8)

## 2020-09-20 LAB — CYTOLOGY - PAP
Adequacy: ABSENT
Comment: NEGATIVE
Diagnosis: NEGATIVE
High risk HPV: NEGATIVE

## 2020-09-20 LAB — LIPID PANEL
Cholesterol: 136 mg/dL (ref <200)
HDL: 51 mg/dL (ref >=50)
LDL Cholesterol (Calc): 71 mg/dL (calc)
Non-HDL Cholesterol (Calc): 85 mg/dL (calc) (ref <130)
Total CHOL/HDL Ratio: 2.7 (calc) (ref <5.0)
Triglycerides: 60 mg/dL (ref <150)

## 2020-09-20 LAB — HEMOGLOBIN A1C
Hgb A1c MFr Bld: 5.8 % of total Hgb — ABNORMAL HIGH (ref <5.7)
Mean Plasma Glucose: 120 mg/dL
eAG (mmol/L): 6.6 mmol/L

## 2020-09-20 LAB — TSH: TSH: 3.72 mIU/L

## 2020-09-24 ENCOUNTER — Ambulatory Visit: Payer: 59 | Admitting: Podiatry

## 2020-10-28 ENCOUNTER — Encounter: Payer: Self-pay | Admitting: Family Medicine

## 2020-10-29 NOTE — Progress Notes (Signed)
Name: Michelle Ware   MRN: [id number]    DOB: 1980-03-31   Date:10/30/2020       Progress Note  Subjective  Chief Complaint  Acid Reflux  HPI  Chest pain: she states woke up on Sunday , Oct 9 th with burning sensation of her anterior chest. She got up and after some eructation it helped, she felt some chest discomfort and had to keep burping. She states pain was radiating from substernal area and radiated to her throat. She went to Urgent Care and had EKG had some labs, diagnosed with low potassium and sent home. She was advised to follow up with me. She took some Gas-X last night and seems to have helped. Reviewed EKG  She ate a burger around 7:30 pm on Saturday. No change in bowel movements, normal appetite. She was fasting for church last week. Denies SOB, diaphoresis, nausea or vomiting with chest pain   Obesity: she has lost 13 lbs since last visit about one month ago. She states cutting down on portion and was fasting last week.    Patient Active Problem List   Diagnosis Date Noted   History of thyroidectomy 08/05/2017   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 of myomectomy and CHTN  09/28/2016   Tenosynovitis of wrist 05/21/2016   Morbid obesity with BMI of 40.0-44.9, adult ([place]) 03/31/2016   H/O myomectomy 03/11/2015   Essential hypertension 11/28/2014   Obesity, Class III, BMI 40-49.9 (morbid obesity) ([place]) 11/28/2014   Hypertrichosis 07/24/2014   Vitamin D deficiency 07/24/2014   PCOS (polycystic ovarian syndrome) 07/24/2014   History of abnormal cervical Pap smear 11/24/2011   Genital herpes 11/29/2008    Past Surgical History:  Procedure Laterality Date   BIOPSY THYROID  07/2015   Dr. Gabriel Carina   CESAREAN SECTION N/A 09/28/2016   Procedure: Primary CESAREAN SECTION;  Surgeon: Servando Salina, MD;  Location: [facility];  Service: Obstetrics;  Laterality: N/A;  EDD: 10/19/16 Allergy: Hydrocodone-Acetaminophen    LAPAROSCOPIC GELPORT ASSISTED MYOMECTOMY N/A 03/11/2015   Procedure: LAPAROSCOPIC  MYOMECTOMY--attempted;  Surgeon: Rubie Maid, MD;  Location: [facility];  Service: Gynecology;  Laterality: N/A;   LAPAROTOMY N/A 03/11/2015   Procedure: LAPAROTOMY--MYOMECTOMY;  Surgeon: Rubie Maid, MD;  Location: [facility];  Service: Gynecology;  Laterality: N/A;   MOUTH SURGERY  1996   TOTAL THYROIDECTOMY Bilateral 03/05/2017   Dr. Maudie Mercury [facility]    Family History  Problem Relation Age of Onset   Thyroid disease Mother    Hypertension Mother    Cancer Maternal Aunt    Cancer Maternal Grandfather    Prostate cancer Maternal Grandfather    Stroke Maternal Grandmother    Breast cancer Paternal Grandmother    Heart failure Paternal Grandfather    Liver disease Brother        Fatty Liver    Social History   Tobacco Use   Smoking status: Never   Smokeless tobacco: Never  Substance Use Topics   Alcohol use: Yes    Comment: occasionally, about once a month     Current Outpatient Medications:    docusate sodium (COLACE) 100 MG capsule, Take 1 capsule (100 mg total) by mouth daily as needed for mild constipation (for iron supplement side effects)., Disp: 90 capsule, Rfl: 1   ferrous sulfate 325 (65 FE) MG EC tablet, Take 1 tablet (325 mg total) by mouth 3 (three) times daily with meals., Disp: 270 tablet, Rfl: 0   levothyroxine (SYNTHROID) 200 MCG  tablet, Take 1 tablet (200 mcg total) by mouth daily before breakfast. Take an extra half pill 3 x  weekly (mon, wed, fri for total of 300 mcg), Disp: 111 tablet, Rfl: 1   norelgestromin-ethinyl estradiol (ORTHO EVRA) 150-35 MCG/24HR transdermal patch, Place 1 patch onto the skin once a week., Disp: 9 patch, Rfl: 3   triamterene-hydrochlorothiazide (DYAZIDE) 37.5-25 MG capsule, Take 1 each (1 capsule total) by mouth daily., Disp: 90 capsule, Rfl: 1   valACYclovir (VALTREX) 500 MG tablet, TAKE 1 TABLET BY MOUTH 2 TIMES DAILY FOR OUTBREAKS AND ONCE DAILY FOR  PREVENTION AS DIRECTED, Disp: 60 tablet, Rfl: 3   Vitamin D, Ergocalciferol, (DRISDOL) 1.25 MG (50000 UNIT) CAPS capsule, Take 1 capsule (50,000 Units total) by mouth every 7 (seven) days., Disp: 12 capsule, Rfl: 0  Allergies  Allergen Reactions   Hydrocodone Itching    I personally reviewed active problem list, medication list, allergies, family history, social history, health maintenance with the patient/caregiver today.   ROS  Constitutional: Negative for fever, positive for  weight change.  Respiratory: positive  for cough but no  shortness of breath.   Cardiovascular: positive  for chest pain but no  palpitations.  Gastrointestinal: Negative for abdominal pain, no bowel changes.  Musculoskeletal: Negative for gait problem or joint swelling.  Skin: Negative for rash.  Neurological: Negative for dizziness or headache.  No other specific complaints in a complete review of systems (except as listed in HPI above).   Objective  Vitals:   10/30/20 0907  BP: 128/74  Pulse: 93  Resp: 16  Temp: 98 F (36.7 C)  SpO2: 97%  Weight: (!) 314 lb (142.4 kg)  Height: _0  (1.778 m)    Body mass index is 45.05 kg/m.  Physical Exam  Constitutional: Patient appears well-developed and well-nourished. Obese  No distress.  HEENT: head atraumatic, normocephalic, pupils equal and reactive to light, neck supple Cardiovascular: Normal rate, regular rhythm and normal heart sounds.  No murmur heard. No BLE edema. Pulmonary/Chest: Effort normal and breath sounds normal. No respiratory distress. Abdominal: Soft.  There is no tenderness. Psychiatric: Patient has a normal mood and affect. behavior is normal. Judgment and thought content normal.   Recent Results (from the past 2160 hour(s))  Cytology - PAP     Status: None   Collection Time: 09/19/20  8:32 AM  Result Value Ref Range   High risk HPV Negative    Adequacy      Satisfactory for evaluation; transformation zone component ABSENT.    Diagnosis      - Negative for intraepithelial lesion or malignancy (NILM)   Comment Normal Reference Range HPV - Negative   Lipid panel     Status: None   Collection Time: 09/19/20  8:56 AM  Result Value Ref Range   Cholesterol 136 <200 mg/dL   HDL 51 > OR = 50 mg/dL   Triglycerides 60 <150 mg/dL   LDL Cholesterol (Calc) 71 mg/dL (calc)    Comment: Reference range: <100 . Desirable range <100 mg/dL for primary prevention;   <70 mg/dL for patients with CHD or diabetic patients  with > or = 2 CHD risk factors. Marland Kitchen LDL-C is now calculated using the Martin-Hopkins  calculation, which is a validated novel method providing  better accuracy than the Friedewald equation in the  estimation of LDL-C.  Cresenciano Genre et al. Annamaria Helling. 2035;597(41): 2061-2068  (http://education.[employer].com/faq/FAQ164)    Total CHOL/HDL Ratio 2.7 <5.0 (calc)   Non-HDL Cholesterol (Calc) 85 <  130 mg/dL (calc)    Comment: For patients with diabetes plus 1 major ASCVD risk  factor, treating to a non-HDL-C goal of <100 mg/dL  (LDL-C of <70 mg/dL) is considered a therapeutic  option.   Microalbumin / creatinine urine ratio     Status: None   Collection Time: 09/19/20  8:56 AM  Result Value Ref Range   Creatinine, Urine 190 20 - 275 mg/dL   Microalb, Ur 0.5 mg/dL    Comment: Reference Range Not established    Microalb Creat Ratio 3 <30 mcg/mg creat    Comment: . The ADA defines abnormalities in albumin excretion as follows: Marland Kitchen Albuminuria Category        Result (mcg/mg creatinine) . Normal to Mildly increased   <30 Moderately increased         30-299  Severely increased           > OR = 300 . The ADA recommends that at least two of three specimens collected within a 3-6 month period be abnormal before considering a patient to be within a diagnostic category.   COMPLETE METABOLIC PANEL WITH GFR     Status: None   Collection Time: 09/19/20  8:56 AM  Result Value Ref Range   Glucose, Bld 95 65 - 99  mg/dL    Comment: .            Fasting reference interval .    BUN 15 7 - 25 mg/dL   Creat 0.90 0.50 - 0.99 mg/dL   eGFR 82 > OR = 60 mL/min/1.51m    Comment: The eGFR is based on the CKD-EPI 2021 equation. To calculate  the new eGFR from a previous Creatinine or Cystatin C result, go to https://www.kidney.org/professionals/ kdoqi/gfr%5Fcalculator    BUN/Creatinine Ratio NOT APPLICABLE 6 - 22 (calc)   Sodium 141 135 - 146 mmol/L   Potassium 4.0 3.5 - 5.3 mmol/L   Chloride 106 98 - 110 mmol/L   CO2 25 20 - 32 mmol/L   Calcium 9.1 8.6 - 10.2 mg/dL   Total Protein 7.2 6.1 - 8.1 g/dL   Albumin 4.3 3.6 - 5.1 g/dL   Globulin 2.9 1.9 - 3.7 g/dL (calc)   AG Ratio 1.5 1.0 - 2.5 (calc)   Total Bilirubin 0.4 0.2 - 1.2 mg/dL   Alkaline phosphatase (APISO) 42 31 - 125 U/L   AST 19 10 - 30 U/L   ALT 10 6 - 29 U/L  CBC with Differential/Platelet     Status: Abnormal   Collection Time: 09/19/20  8:56 AM  Result Value Ref Range   WBC 6.0 3.8 - 10.8 Thousand/uL   RBC 4.60 3.80 - 5.10 Million/uL   Hemoglobin 11.2 (L) 11.7 - 15.5 g/dL   HCT 35.7 35.0 - 45.0 %   MCV 77.6 (L) 80.0 - 100.0 fL   MCH 24.3 (L) 27.0 - 33.0 pg   MCHC 31.4 (L) 32.0 - 36.0 g/dL   RDW 14.7 11.0 - 15.0 %   Platelets 317 140 - 400 Thousand/uL   MPV 9.8 7.5 - 12.5 fL   Neutro Abs 3,300 1,500 - 7,800 cells/uL   Lymphs Abs 2,148 850 - 3,900 cells/uL   Absolute Monocytes 450 200 - 950 cells/uL   Eosinophils Absolute 60 15 - 500 cells/uL   Basophils Absolute 42 0 - 200 cells/uL   Neutrophils Relative % 55 %   Total Lymphocyte 35.8 %   Monocytes Relative 7.5 %   Eosinophils Relative 1.0 %  Basophils Relative 0.7 %  Hemoglobin A1c     Status: Abnormal   Collection Time: 09/19/20  8:56 AM  Result Value Ref Range   Hgb A1c MFr Bld 5.8 (H) <5.7 % of total Hgb    Comment: For someone without known diabetes, a hemoglobin  A1c value between 5.7% and 6.4% is consistent with prediabetes and should be confirmed with a   follow-up test. . For someone with known diabetes, a value <7% indicates that their diabetes is well controlled. A1c targets should be individualized based on duration of diabetes, age, comorbid conditions, and other considerations. . This assay result is consistent with an increased risk of diabetes. . Currently, no consensus exists regarding use of hemoglobin A1c for diagnosis of diabetes for children. .    Mean Plasma Glucose 120 mg/dL   eAG (mmol/L) 6.6 mmol/L  TSH     Status: None   Collection Time: 09/19/20  8:56 AM  Result Value Ref Range   TSH 3.72 mIU/L    Comment:           Reference Range .           > or = 20 Years  0.40-4.50 .                Pregnancy Ranges           First trimester    0.26-2.66           Second trimester   0.55-2.73           Third trimester    0.43-2.91   Iron, TIBC and Ferritin Panel     Status: None   Collection Time: 09/19/20  8:56 AM  Result Value Ref Range   Iron 72 40 - 190 mcg/dL   TIBC 389 250 - 450 mcg/dL (calc)   %SAT 19 16 - 45 % (calc)   Ferritin 17 16 - 232 ng/mL     PHQ2/9: Depression screen [facility] 2/9 10/30/2020 09/19/2020 09/09/2020 07/08/2020 12/27/2019  Decreased Interest 0 0 0 0 0  Down, Depressed, Hopeless 2 0 0 0 0  PHQ - 2 Score 2 0 0 0 0  Altered sleeping 0 - 0 - -  Tired, decreased energy 0 - 0 - -  Change in appetite 0 - 0 - -  Feeling bad or failure about yourself  0 - 0 - -  Trouble concentrating 0 - 0 - -  Moving slowly or fidgety/restless 0 - 0 - -  Suicidal thoughts 0 - 0 - -  PHQ-9 Score 2 - 0 - -  Difficult doing work/chores - - Not difficult at all - -  Some recent data might be hidden    phq 9 is negative, she states just stressed, son has been sick and working too many hours lately    Fall Risk: Fall Risk  10/30/2020 09/19/2020 09/09/2020 07/08/2020 12/27/2019  Falls in the past year? 0 0 0 0 0  Number falls in past yr: 0 0 0 0 0  Injury with Fall? 0 0 0 0 0  Risk for fall due to : No Fall Risks  No Fall Risks - - -  Follow up Falls prevention discussed Falls prevention discussed - Falls evaluation completed -      Functional Status Survey: Is the patient deaf or have difficulty hearing?: No Does the patient have difficulty seeing, even when wearing glasses/contacts?: No Does the patient have difficulty concentrating, remembering, or making decisions?: No Does  the patient have difficulty walking or climbing stairs?: No Does the patient have difficulty dressing or bathing?: No Does the patient have difficulty doing errands alone such as visiting a doctor's office or shopping?: No    Assessment & Plan   1. Essential hypertension  BP is at goal   2. Chest pain in adult  Likely from GERD  3. Gastroesophageal reflux disease without esophagitis  - omeprazole (PRILOSEC) 20 MG capsule; Take 1 capsule (20 mg total) by mouth 2 (two) times daily before a meal. For the first week take twice daily, second week before breast, after that every other day  Dispense: 60 capsule; Refill: 0  4. Hypokalemia  - potassium chloride SA (KLOR-CON) 20 MEQ tablet; Take 1 tablet (20 mEq total) by mouth daily.  Dispense: 5 tablet; Refill: 0

## 2020-10-30 ENCOUNTER — Ambulatory Visit (INDEPENDENT_AMBULATORY_CARE_PROVIDER_SITE_OTHER): Payer: 59 | Admitting: Family Medicine

## 2020-10-30 ENCOUNTER — Other Ambulatory Visit: Payer: Self-pay

## 2020-10-30 ENCOUNTER — Encounter: Payer: Self-pay | Admitting: Family Medicine

## 2020-10-30 VITALS — BP 128/74 | HR 93 | Temp 98.0°F | Resp 16 | Ht 70.0 in | Wt 314.0 lb

## 2020-10-30 DIAGNOSIS — R079 Chest pain, unspecified: Secondary | ICD-10-CM | POA: Diagnosis not present

## 2020-10-30 DIAGNOSIS — K219 Gastro-esophageal reflux disease without esophagitis: Secondary | ICD-10-CM | POA: Diagnosis not present

## 2020-10-30 DIAGNOSIS — I10 Essential (primary) hypertension: Secondary | ICD-10-CM | POA: Diagnosis not present

## 2020-10-30 DIAGNOSIS — E876 Hypokalemia: Secondary | ICD-10-CM

## 2020-10-30 MED ORDER — OMEPRAZOLE 20 MG PO CPDR: 20.0000 mg | DELAYED_RELEASE_CAPSULE | Freq: Two times a day (BID) | ORAL | 0 refills | Status: DC

## 2020-10-30 MED ORDER — POTASSIUM CHLORIDE CRYS ER 20 MEQ PO TBCR: 20.0000 meq | EXTENDED_RELEASE_TABLET | Freq: Every day | ORAL | 0 refills | Status: DC

## 2020-10-31 ENCOUNTER — Encounter: Payer: Self-pay | Admitting: Family Medicine

## 2020-11-05 ENCOUNTER — Encounter: Payer: Self-pay | Admitting: Family Medicine

## 2020-11-19 ENCOUNTER — Ambulatory Visit: Payer: 59 | Admitting: Family Medicine

## 2020-11-21 ENCOUNTER — Other Ambulatory Visit: Payer: Self-pay | Admitting: Family Medicine

## 2020-11-21 DIAGNOSIS — K219 Gastro-esophageal reflux disease without esophagitis: Secondary | ICD-10-CM

## 2020-11-25 ENCOUNTER — Encounter: Payer: Self-pay | Admitting: Nurse Practitioner

## 2020-11-25 ENCOUNTER — Telehealth (INDEPENDENT_AMBULATORY_CARE_PROVIDER_SITE_OTHER): Payer: 59 | Admitting: Nurse Practitioner

## 2020-11-25 ENCOUNTER — Other Ambulatory Visit: Payer: Self-pay

## 2020-11-25 DIAGNOSIS — R051 Acute cough: Secondary | ICD-10-CM | POA: Diagnosis not present

## 2020-11-25 DIAGNOSIS — J029 Acute pharyngitis, unspecified: Secondary | ICD-10-CM

## 2020-11-25 LAB — POCT RAPID STREP A (OFFICE): Rapid Strep A Screen: NEGATIVE

## 2020-11-25 MED ORDER — BENZONATATE 100 MG PO CAPS
200.0000 mg | ORAL_CAPSULE | Freq: Three times a day (TID) | ORAL | 0 refills | Status: DC | PRN
Start: 2020-11-25 — End: 2021-01-31

## 2020-11-25 NOTE — Progress Notes (Signed)
Name: Michelle Ware   MRN: [id number]    DOB: Feb 09, 1980   Date:11/25/2020       Progress Note  Subjective  Chief Complaint  Chief Complaint  Patient presents with   Sore Throat    Cough for 5 days, hard to drink and eat. Covid test negative    I connected with  Michelle Ware  on 11/25/20 at  9:20 AM EST by a video enabled telemedicine application and verified that I am speaking with the correct person using two identifiers.  I discussed the limitations of evaluation and management by telemedicine and the availability of in person appointments. The patient expressed understanding and agreed to proceed with a virtual visit  Staff also discussed with the patient that there may be a patient responsible charge related to this service. Patient Location: home Provider Location: [facility] Additional Individuals present: alone  HPI  Cough: She says she started getting sick last week.  She said she had a stuffy nose and a cough.  She says she feels like they have improved a little but she is still coughing a lot and it is making it difficult to sleep.  She did take three covid test that were negative.  She denies any fever, shortness of breath or body aches. She was taking mucinex but stopped.  Encouraged her to continue taking mucinex for symptoms.  Will send in prescription for tessalon perls.    Sore throat:  She says that it is really painful to swallow.  She has been doing throat lozenges but they are not really helping.  She is going to come by and get a strep test done. Discussed doing salt water gargles.   Patient Active Problem List   Diagnosis Date Noted   History of thyroidectomy 08/05/2017   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 of myomectomy and CHTN  09/28/2016   Tenosynovitis of wrist 05/21/2016   Morbid obesity with BMI of 40.0-44.9, adult ([place]) 03/31/2016   H/O myomectomy 03/11/2015   Essential hypertension 11/28/2014   Obesity, Class III,  BMI 40-49.9 (morbid obesity) ([place]) 11/28/2014   Hypertrichosis 07/24/2014   Vitamin D deficiency 07/24/2014   PCOS (polycystic ovarian syndrome) 07/24/2014   History of abnormal cervical Pap smear 11/24/2011   Genital herpes 11/29/2008    Social History   Tobacco Use   Smoking status: Never   Smokeless tobacco: Never  Substance Use Topics   Alcohol use: Yes    Comment: occasionally, about once a month     Current Outpatient Medications:    docusate sodium (COLACE) 100 MG capsule, Take 1 capsule (100 mg total) by mouth daily as needed for mild constipation (for iron supplement side effects)., Disp: 90 capsule, Rfl: 1   ferrous sulfate 325 (65 FE) MG EC tablet, Take 1 tablet (325 mg total) by mouth 3 (three) times daily with meals., Disp: 270 tablet, Rfl: 0   levothyroxine (SYNTHROID) 200 MCG tablet, Take 1 tablet (200 mcg total) by mouth daily before breakfast. Take an extra half pill 3 x  weekly (mon, wed, fri for total of 300 mcg), Disp: 111 tablet, Rfl: 1   norelgestromin-ethinyl estradiol (ORTHO EVRA) 150-35 MCG/24HR transdermal patch, Place 1 patch onto the skin once a week., Disp: 9 patch, Rfl: 3   omeprazole (PRILOSEC) 20 MG capsule, Take 1 capsule (20 mg total) by mouth 2 (two) times daily before a meal. For the first week take twice daily, second week before breast, after that every  other day, Disp: 60 capsule, Rfl: 0   potassium chloride SA (KLOR-CON) 20 MEQ tablet, Take 1 tablet (20 mEq total) by mouth daily., Disp: 5 tablet, Rfl: 0   triamterene-hydrochlorothiazide (DYAZIDE) 37.5-25 MG capsule, Take 1 each (1 capsule total) by mouth daily., Disp: 90 capsule, Rfl: 1   valACYclovir (VALTREX) 500 MG tablet, TAKE 1 TABLET BY MOUTH 2 TIMES DAILY FOR OUTBREAKS AND ONCE DAILY FOR PREVENTION AS DIRECTED, Disp: 60 tablet, Rfl: 3   Vitamin D, Ergocalciferol, (DRISDOL) 1.25 MG (50000 UNIT) CAPS capsule, Take 1 capsule (50,000 Units total) by mouth every 7 (seven) days., Disp: 12 capsule,  Rfl: 0  Allergies  Allergen Reactions   Hydrocodone Itching    I personally reviewed active problem list, medication list, allergies with the patient/caregiver today.  ROS  Constitutional: Negative for fever or weight change.  HEENT: positive for nasal congestion and sore throat Respiratory: Positive for cough, negative for shortness of breath.   Cardiovascular: Negative for chest pain or palpitations.  Gastrointestinal: Negative for abdominal pain, no bowel changes.  Musculoskeletal: Negative for gait problem or joint swelling.  Skin: Negative for rash.  Neurological: Negative for dizziness or headache.  No other specific complaints in a complete review of systems (except as listed in HPI above).   Objective  Virtual encounter, vitals not obtained.  There is no height or weight on file to calculate BMI.  Nursing Note and Vital Signs reviewed.  Physical Exam  Awake, alert and oriented, speaking in complete sentences  No results found for this or any previous visit (from the past 72 hour(s)).  Assessment & Plan  1. Acute cough -OTC treatments, push fluids - benzonatate (TESSALON) 100 MG capsule; Take 2 capsules (200 mg total) by mouth 3 (three) times daily as needed for cough.  Dispense: 20 capsule; Refill: 0  2. Sore throat -salt water gargles - POCT rapid strep A   -Red flags and when to present for emergency care or RTC including fever >101.5F, chest pain, shortness of breath, new/worsening/un-resolving symptoms, reviewed with patient at time of visit. Follow up and care instructions discussed and provided in AVS. - I discussed the assessment and treatment plan with the patient. The patient was provided an opportunity to ask questions and all were answered. The patient agreed with the plan and demonstrated an understanding of the instructions.  I provided 20 minutes of non-face-to-face time during this encounter.  Bo Merino, FNP

## 2020-11-28 ENCOUNTER — Encounter: Payer: Self-pay | Admitting: Nurse Practitioner

## 2020-11-28 ENCOUNTER — Other Ambulatory Visit: Payer: Self-pay | Admitting: Nurse Practitioner

## 2020-11-28 DIAGNOSIS — J028 Acute pharyngitis due to other specified organisms: Secondary | ICD-10-CM

## 2020-11-28 MED ORDER — AZITHROMYCIN 500 MG PO TABS: 500.0000 mg | ORAL_TABLET | Freq: Every day | ORAL | 0 refills | Status: AC

## 2020-12-11 ENCOUNTER — Telehealth: Payer: Self-pay

## 2020-12-11 NOTE — Telephone Encounter (Signed)
Pt walked in stated she felt dizzy and wanted blood pressure checked.  All schedules were full patient was told to go to urgent care or [facility] clinic walk in if felt bad.

## 2020-12-24 ENCOUNTER — Ambulatory Visit: Payer: Self-pay

## 2020-12-24 NOTE — Telephone Encounter (Signed)
Pt called, states that she already has appt with Dermatology in the morning and no longer needs assistance. Advised pt to call back if needed.   Summary: rash on back   The patient would like to be contacted by a member of staff regarding a rash on their back   The patient has noticed raised skin irritation on their back   The patient is uncertain of exactly how long the irritation has occurred please contact further when available

## 2020-12-30 ENCOUNTER — Telehealth: Payer: Self-pay

## 2020-12-30 NOTE — Telephone Encounter (Signed)
Copied from [place] [id number]. Topic: Referral - Request for Referral >> Dec 30, 2020 12:01 PM Rayann Heman wrote: Has patient seen PCP for this complaint? No. Referral for which specialty: Therapy  Preferred provider/office: ANY  Reason for referral: Work stress/anxiety/worrying about thing

## 2021-01-03 NOTE — Telephone Encounter (Signed)
Pt called in to follow up on her request for a referral to a therapist? Please assist further.   CB: [phone]

## 2021-01-06 NOTE — Telephone Encounter (Signed)
Left voicemail for return call to clarify

## 2021-01-30 NOTE — Progress Notes (Signed)
Name: Michelle Ware   MRN: [id number]    DOB: 29-May-1980   Date:01/31/2021       Progress Note  Subjective  Chief Complaint  Referral- Therapy  HPI  Anxiety: she is seeing a therapist , two sessions so far and is seeing Farrel Gordon. She is a single mom of a 43 yo boy. She has been feeling overwhelmed for year . Worries about having to work Saturdays and not having daycare. She used to stay at her parents her house with her son on Fridays nights but in September she noticed 3 bites on her back, got very stressed about bed bugs. She went to the dermatologist, asked exterminator to check her home, however noticed increase in anxiety, inability to eat at times, was driving around to avoid going home. Still worries about taking bed bugs home, afraid to staying with her parents due to possibility of the bed bugs being present in their house. She states therapist has helped and feeling better this week, but still not back to her normal self. She states she has not been back to her parents house in months. She is afraid to spreading bed bugs and is avoiding going to her parents house She had a couple episodes of palpitation and feeling super anxious, possible panic attack  Hypothyroidism: last TSH at goal, she lost weight , discussed rechecking level but she states she was not hungry due to anxiety   Hypokalemia: she never got the rx filled, denies cramps , takes diuretic  Iron deficiency anemia: last HCT was low but she would like to hold off on rechecking labs, denies pica   Patient Active Problem List   Diagnosis Date Noted   History of thyroidectomy 08/05/2017   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 of myomectomy and CHTN  09/28/2016   Tenosynovitis of wrist 05/21/2016   Morbid obesity with BMI of 40.0-44.9, adult ([place]) 03/31/2016   H/O myomectomy 03/11/2015   Essential hypertension 11/28/2014   Obesity, Class III, BMI 40-49.9 (morbid obesity) ([place])  11/28/2014   Hypertrichosis 07/24/2014   Vitamin D deficiency 07/24/2014   PCOS (polycystic ovarian syndrome) 07/24/2014   History of abnormal cervical Pap smear 11/24/2011   Genital herpes 11/29/2008    Past Surgical History:  Procedure Laterality Date   BIOPSY THYROID  07/2015   Dr. Gabriel Carina   CESAREAN SECTION N/A 09/28/2016   Procedure: Primary CESAREAN SECTION;  Surgeon: Servando Salina, MD;  Location: [place];  Service: Obstetrics;  Laterality: N/A;  EDD: 10/19/16 Allergy: Hydrocodone-Acetaminophen   LAPAROSCOPIC GELPORT ASSISTED MYOMECTOMY N/A 03/11/2015   Procedure: LAPAROSCOPIC  MYOMECTOMY--attempted;  Surgeon: Rubie Maid, MD;  Location: [facility];  Service: Gynecology;  Laterality: N/A;   LAPAROTOMY N/A 03/11/2015   Procedure: LAPAROTOMY--MYOMECTOMY;  Surgeon: Rubie Maid, MD;  Location: [facility];  Service: Gynecology;  Laterality: N/A;   MOUTH SURGERY  1996   TOTAL THYROIDECTOMY Bilateral 03/05/2017   Dr. Maudie Mercury [facility]    Family History  Problem Relation Age of Onset   Thyroid disease Mother    Hypertension Mother    Cancer Maternal Aunt    Cancer Maternal Grandfather    Prostate cancer Maternal Grandfather    Stroke Maternal Grandmother    Breast cancer Paternal Grandmother    Heart failure Paternal Grandfather    Liver disease Brother        Fatty Liver    Social History   Tobacco Use   Smoking status: Never   Smokeless  tobacco: Never  Substance Use Topics   Alcohol use: Yes    Comment: occasionally, about once a month     Current Outpatient Medications:    docusate sodium (COLACE) 100 MG capsule, Take 1 capsule (100 mg total) by mouth daily as needed for mild constipation (for iron supplement side effects)., Disp: 90 capsule, Rfl: 1   ferrous sulfate 325 (65 FE) MG EC tablet, Take 1 tablet (325 mg total) by mouth 3 (three) times daily with meals., Disp: 270 tablet, Rfl: 0   levothyroxine (SYNTHROID) 200 MCG tablet, Take 1 tablet (200 mcg total)  by mouth daily before breakfast. Take an extra half pill 3 x  weekly (mon, wed, fri for total of 300 mcg), Disp: 111 tablet, Rfl: 1   norelgestromin-ethinyl estradiol (ORTHO EVRA) 150-35 MCG/24HR transdermal patch, Place 1 patch onto the skin once a week., Disp: 9 patch, Rfl: 3   omeprazole (PRILOSEC) 20 MG capsule, Take 1 capsule (20 mg total) by mouth 2 (two) times daily before a meal. For the first week take twice daily, second week before breast, after that every other day, Disp: 60 capsule, Rfl: 0   potassium chloride SA (KLOR-CON) 20 MEQ tablet, Take 1 tablet (20 mEq total) by mouth daily., Disp: 5 tablet, Rfl: 0   triamterene-hydrochlorothiazide (DYAZIDE) 37.5-25 MG capsule, Take 1 each (1 capsule total) by mouth daily., Disp: 90 capsule, Rfl: 1   valACYclovir (VALTREX) 500 MG tablet, TAKE 1 TABLET BY MOUTH 2 TIMES DAILY FOR OUTBREAKS AND ONCE DAILY FOR PREVENTION AS DIRECTED, Disp: 60 tablet, Rfl: 3   Vitamin D, Ergocalciferol, (DRISDOL) 1.25 MG (50000 UNIT) CAPS capsule, Take 1 capsule (50,000 Units total) by mouth every 7 (seven) days., Disp: 12 capsule, Rfl: 0  Allergies  Allergen Reactions   Hydrocodone Itching    I personally reviewed active problem list, medication list, allergies, family history, social history, health maintenance with the patient/caregiver today.   ROS  Constitutional: Negative for fever , positive for  weight change -loss .  Respiratory: Negative for cough and shortness of breath.   Cardiovascular: Negative for chest pain or palpitations.  Gastrointestinal: Negative for abdominal pain, no bowel changes.  Musculoskeletal: Negative for gait problem or joint swelling.  Skin: Negative for rash.  Neurological: Negative for dizziness or headache.  No other specific complaints in a complete review of systems (except as listed in HPI above).   Objective  Vitals:   01/31/21 1503 01/31/21 1509  BP: (!) 152/80 (!) 148/76  Pulse: 89   Resp: 16   Temp: 98 F  (36.7 C)   TempSrc: Oral   SpO2: 99%   Weight: (!) 302 lb 12.8 oz (137.3 kg)   Height: 5\' 10"  (1.778 m)     Body mass index is 43.45 kg/m.  Physical Exam  Constitutional: Patient appears well-developed and well-nourished. Obese  No distress.  HEENT: head atraumatic, normocephalic, pupils equal and reactive to light, neck supple, throat within normal limits Cardiovascular: Normal rate, regular rhythm and normal heart sounds.  No murmur heard. No BLE edema. Pulmonary/Chest: Effort normal and breath sounds normal. No respiratory distress. Abdominal: Soft.  There is no tenderness. Psychiatric: Patient cried during the visit, she states she realizes her fear is unreasonable but unable to stop it . Behavior is normal. Judgment and thought content normal.   Recent Results (from the past 2160 hour(s))  POCT rapid strep A     Status: Abnormal   Collection Time: 11/25/20  9:23 AM  Result  Value Ref Range   Rapid Strep A Screen Negative Negative    PHQ2/9: Depression screen [facility] 2/9 01/31/2021 11/25/2020 10/30/2020 09/19/2020 09/09/2020  Decreased Interest 1 0 0 0 0  Down, Depressed, Hopeless 1 0 2 0 0  PHQ - 2 Score 2 0 2 0 0  Altered sleeping 0 - 0 - 0  Tired, decreased energy 1 - 0 - 0  Change in appetite 1 - 0 - 0  Feeling bad or failure about yourself  1 - 0 - 0  Trouble concentrating 1 - 0 - 0  Moving slowly or fidgety/restless 0 - 0 - 0  Suicidal thoughts 0 - 0 - 0  PHQ-9 Score 6 - 2 - 0  Difficult doing work/chores Somewhat difficult - - - Not difficult at all  Some recent data might be hidden    phq 9 is positive  GAD 7 : Generalized Anxiety Score 01/31/2021 12/27/2017 10/27/2017  Nervous, Anxious, on Edge 1 0 0  Control/stop worrying 1 0 0  Worry too much - different things 1 1 0  Trouble relaxing 1 0 0  Restless 0 0 0  Easily annoyed or irritable 0 0 0  Afraid - awful might happen 1 0 0  Total GAD 7 Score 5 1 0  Anxiety Difficulty Somewhat difficult Not difficult at all  Not difficult at all      Fall Risk: Fall Risk  01/31/2021 11/25/2020 10/30/2020 09/19/2020 09/09/2020  Falls in the past year? 0 0 0 0 0  Number falls in past yr: 0 0 0 0 0  Injury with Fall? 0 0 0 0 0  Risk for fall due to : No Fall Risks - No Fall Risks No Fall Risks -  Follow up Falls prevention discussed Falls evaluation completed Falls prevention discussed Falls prevention discussed -     Functional Status Survey: Is the patient deaf or have difficulty hearing?: No Does the patient have difficulty seeing, even when wearing glasses/contacts?: No Does the patient have difficulty concentrating, remembering, or making decisions?: No Does the patient have difficulty walking or climbing stairs?: No Does the patient have difficulty dressing or bathing?: No Does the patient have difficulty doing errands alone such as visiting a doctor's office or shopping?: No    Assessment & Plan  1. Essential hypertension  Bp elevated , she was anxious when she came in   2. Hypokalemia   3. History of thyroidectomy   4. Iron deficiency anemia due to chronic blood loss   5. Post-surgical hypothyroidism  Recheck labs next visit   6. Anxiety  She refuses medication at this time, she wants to continue therapy

## 2021-01-31 ENCOUNTER — Encounter: Payer: Self-pay | Admitting: Family Medicine

## 2021-01-31 ENCOUNTER — Other Ambulatory Visit: Payer: Self-pay

## 2021-01-31 ENCOUNTER — Ambulatory Visit: Payer: 59 | Admitting: Family Medicine

## 2021-01-31 VITALS — BP 148/76 | HR 89 | Temp 98.0°F | Resp 16 | Ht 70.0 in | Wt 302.8 lb

## 2021-01-31 DIAGNOSIS — D500 Iron deficiency anemia secondary to blood loss (chronic): Secondary | ICD-10-CM

## 2021-01-31 DIAGNOSIS — I10 Essential (primary) hypertension: Secondary | ICD-10-CM | POA: Diagnosis not present

## 2021-01-31 DIAGNOSIS — E876 Hypokalemia: Secondary | ICD-10-CM

## 2021-01-31 DIAGNOSIS — E890 Postprocedural hypothyroidism: Secondary | ICD-10-CM

## 2021-01-31 DIAGNOSIS — F419 Anxiety disorder, unspecified: Secondary | ICD-10-CM

## 2021-02-03 NOTE — Progress Notes (Deleted)
Name: Michelle Ware   MRN: [id number]    DOB: [birth year]   Date:02/03/2021       Progress Note  Subjective  Chief Complaint  FMLA  HPI  *** Patient Active Problem List   Diagnosis Date Noted   History of thyroidectomy 08/05/2017   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 of myomectomy and CHTN  09/28/2016   Tenosynovitis of wrist 05/21/2016   Morbid obesity with BMI of 40.0-44.9, adult ([place]) 03/31/2016   H/O myomectomy 03/11/2015   Essential hypertension 11/28/2014   Obesity, Class III, BMI 40-49.9 (morbid obesity) ([place]) 11/28/2014   Hypertrichosis 07/24/2014   Vitamin D deficiency 07/24/2014   PCOS (polycystic ovarian syndrome) 07/24/2014   History of abnormal cervical Pap smear 11/24/2011   Genital herpes 11/29/2008    Past Surgical History:  Procedure Laterality Date   BIOPSY THYROID  07/2015   Dr. Gabriel Carina   CESAREAN SECTION N/A 09/28/2016   Procedure: Primary CESAREAN SECTION;  Surgeon: Servando Salina, MD;  Location: [place];  Service: Obstetrics;  Laterality: N/A;  EDD: 10/19/16 Allergy: Hydrocodone-Acetaminophen   LAPAROSCOPIC GELPORT ASSISTED MYOMECTOMY N/A 03/11/2015   Procedure: LAPAROSCOPIC  MYOMECTOMY--attempted;  Surgeon: Rubie Maid, MD;  Location: [facility];  Service: Gynecology;  Laterality: N/A;   LAPAROTOMY N/A 03/11/2015   Procedure: LAPAROTOMY--MYOMECTOMY;  Surgeon: Rubie Maid, MD;  Location: [facility];  Service: Gynecology;  Laterality: N/A;   MOUTH SURGERY  1996   TOTAL THYROIDECTOMY Bilateral 03/05/2017   Dr. Maudie Mercury [facility]    Family History  Problem Relation Age of Onset   Thyroid disease Mother    Hypertension Mother    Cancer Maternal Aunt    Cancer Maternal Grandfather    Prostate cancer Maternal Grandfather    Stroke Maternal Grandmother    Breast cancer Paternal Grandmother    Heart failure Paternal Grandfather    Liver disease Brother        Fatty Liver    Social History   Tobacco Use    Smoking status: Never   Smokeless tobacco: Never  Substance Use Topics   Alcohol use: Yes    Comment: occasionally, about once a month     Current Outpatient Medications:    docusate sodium (COLACE) 100 MG capsule, Take 1 capsule (100 mg total) by mouth daily as needed for mild constipation (for iron supplement side effects)., Disp: 90 capsule, Rfl: 1   ferrous sulfate 325 (65 FE) MG EC tablet, Take 1 tablet (325 mg total) by mouth 3 (three) times daily with meals., Disp: 270 tablet, Rfl: 0   levothyroxine (SYNTHROID) 200 MCG tablet, Take 1 tablet (200 mcg total) by mouth daily before breakfast. Take an extra half pill 3 x  weekly (mon, wed, fri for total of 300 mcg), Disp: 111 tablet, Rfl: 1   norelgestromin-ethinyl estradiol (ORTHO EVRA) 150-35 MCG/24HR transdermal patch, Place 1 patch onto the skin once a week., Disp: 9 patch, Rfl: 3   omeprazole (PRILOSEC) 20 MG capsule, Take 1 capsule (20 mg total) by mouth 2 (two) times daily before a meal. For the first week take twice daily, second week before breast, after that every other day, Disp: 60 capsule, Rfl: 0   potassium chloride SA (KLOR-CON) 20 MEQ tablet, Take 1 tablet (20 mEq total) by mouth daily., Disp: 5 tablet, Rfl: 0   triamterene-hydrochlorothiazide (DYAZIDE) 37.5-25 MG capsule, Take 1 each (1 capsule total) by mouth daily., Disp: 90 capsule, Rfl: 1   valACYclovir (VALTREX) 500 MG tablet,  TAKE 1 TABLET BY MOUTH 2 TIMES DAILY FOR OUTBREAKS AND ONCE DAILY FOR PREVENTION AS DIRECTED, Disp: 60 tablet, Rfl: 3   Vitamin D, Ergocalciferol, (DRISDOL) 1.25 MG (50000 UNIT) CAPS capsule, Take 1 capsule (50,000 Units total) by mouth every 7 (seven) days., Disp: 12 capsule, Rfl: 0  Allergies  Allergen Reactions   Hydrocodone Itching    I personally reviewed active problem list, medication list, allergies, family history, social history, health maintenance with the patient/caregiver today.   ROS  ***  Objective  There were no vitals  filed for this visit.  There is no height or weight on file to calculate BMI.  Physical Exam ***  Recent Results (from the past 2160 hour(s))  POCT rapid strep A     Status: Abnormal   Collection Time: 11/25/20  9:23 AM  Result Value Ref Range   Rapid Strep A Screen Negative Negative    PHQ2/9: Depression screen [facility] 2/9 01/31/2021 11/25/2020 10/30/2020 09/19/2020 09/09/2020  Decreased Interest 1 0 0 0 0  Down, Depressed, Hopeless 1 0 2 0 0  PHQ - 2 Score 2 0 2 0 0  Altered sleeping 0 - 0 - 0  Tired, decreased energy 1 - 0 - 0  Change in appetite 1 - 0 - 0  Feeling bad or failure about yourself  1 - 0 - 0  Trouble concentrating 1 - 0 - 0  Moving slowly or fidgety/restless 0 - 0 - 0  Suicidal thoughts 0 - 0 - 0  PHQ-9 Score 6 - 2 - 0  Difficult doing work/chores Somewhat difficult - - - Not difficult at all  Some recent data might be hidden    phq 9 is {gen pos [id number]}   Fall Risk: Fall Risk  01/31/2021 11/25/2020 10/30/2020 09/19/2020 09/09/2020  Falls in the past year? 0 0 0 0 0  Number falls in past yr: 0 0 0 0 0  Injury with Fall? 0 0 0 0 0  Risk for fall due to : No Fall Risks - No Fall Risks No Fall Risks -  Follow up Falls prevention discussed Falls evaluation completed Falls prevention discussed Falls prevention discussed -      Functional Status Survey:      Assessment & Plan  *** There are no diagnoses linked to this encounter.

## 2021-02-04 ENCOUNTER — Ambulatory Visit: Payer: 59 | Admitting: Family Medicine

## 2021-02-08 ENCOUNTER — Other Ambulatory Visit: Payer: Self-pay | Admitting: Family Medicine

## 2021-02-08 DIAGNOSIS — E890 Postprocedural hypothyroidism: Secondary | ICD-10-CM

## 2021-02-08 NOTE — Telephone Encounter (Signed)
Requested Prescriptions  Pending Prescriptions Disp Refills   levothyroxine (SYNTHROID) 200 MCG tablet [Pharmacy Med Name: LEVOTHYROXINE 200 MCG TABLET] 40 tablet 5    Sig: TAKE 1 TABLET (200 MCG TOTAL) BY MOUTH DAILY BEFORE BREAKFAST. TAKE AN EXTRA HALF PILL 3 X WEEKLY (MON, WED, FRI FOR TOTAL OF 300 MCG)     Endocrinology:  Hypothyroid Agents Failed - 02/08/2021 10:13 AM      Failed - TSH needs to be rechecked within 3 months after an abnormal result. Refill until TSH is due.      Passed - TSH in normal range and within 360 days    TSH  Date Value Ref Range Status  09/19/2020 3.72 mIU/L Final    Comment:              Reference Range .           > or = 20 Years  0.40-4.50 .                Pregnancy Ranges           First trimester    0.26-2.66           Second trimester   0.55-2.73           Third trimester    0.43-2.91          Passed - Valid encounter within last 12 months    Recent Outpatient Visits          1 week ago Essential hypertension   [facility] [name], MD   2 months ago Acute cough   [place], FNP   3 months ago Essential hypertension   [facility] [name], MD   4 months ago Well adult exam   [facility] [name], MD   5 months ago Generalized abdominal pain   [facility] [name], MD      Future Appointments            In 3 weeks [name], MD [facility], [place]   In 3 months [name], MD [facility], [place]   In 7 months [name], MD [facility], [facility]

## 2021-02-12 ENCOUNTER — Other Ambulatory Visit: Payer: Self-pay | Admitting: Family Medicine

## 2021-02-12 ENCOUNTER — Encounter: Payer: Self-pay | Admitting: Family Medicine

## 2021-02-12 DIAGNOSIS — I10 Essential (primary) hypertension: Secondary | ICD-10-CM

## 2021-02-12 MED ORDER — TRIAMTERENE-HCTZ 37.5-25 MG PO CAPS: 1.0000 | ORAL_CAPSULE | Freq: Every day | ORAL | 1 refills | Status: DC

## 2021-02-12 NOTE — Telephone Encounter (Signed)
Medication Refill - Medication: triamterene-hydrochlorothiazide (DYAZIDE) 37.5-25 MG capsule   Says PCP has offered her an anxiety medication before, pt is interested in beginning a very low dose per advice from therapist.    Has the patient contacted their pharmacy? Yes.   (Agent: If no, request that the patient contact the pharmacy for the refill. If patient does not wish to contact the pharmacy document the reason why and proceed with request.) (Agent: If yes, when and what did the pharmacy advise?)  Preferred Pharmacy (with phone number or street name):  [id number] [place] [address]  [address] [place] [address]  Phone: [phone] Fax: [phone]   Has the patient been seen for an appointment in the last year OR does the patient have an upcoming appointment? Yes.    Agent: Please be advised that RX refills may take up to 3 business days. We ask that you follow-up with your pharmacy.

## 2021-02-12 NOTE — Telephone Encounter (Signed)
Says PCP has offered her an anxiety medication before, pt is interested in beginning a very low dose per advice from therapist.    Requested Prescriptions  Pending Prescriptions Disp Refills   triamterene-hydrochlorothiazide (DYAZIDE) 37.5-25 MG capsule 90 capsule 1    Sig: Take 1 each (1 capsule total) by mouth daily.     Cardiovascular: Diuretic Combos Failed - 02/12/2021  5:39 PM      Failed - Last BP in normal range    BP Readings from Last 1 Encounters:  01/31/21 (!) 148/76         Passed - K in normal range and within 360 days    Potassium  Date Value Ref Range Status  09/19/2020 4.0 3.5 - 5.3 mmol/L Final         Passed - Na in normal range and within 360 days    Sodium  Date Value Ref Range Status  09/19/2020 141 135 - 146 mmol/L Final  04/07/2016 136 134 - 144 mmol/L Final         Passed - Cr in normal range and within 360 days    Creat  Date Value Ref Range Status  09/19/2020 0.90 0.50 - 0.99 mg/dL Final   Creatinine, Urine  Date Value Ref Range Status  09/19/2020 190 20 - 275 mg/dL Final         Passed - Ca in normal range and within 360 days    Calcium  Date Value Ref Range Status  09/19/2020 9.1 8.6 - 10.2 mg/dL Final         Passed - Valid encounter within last 6 months    Recent Outpatient Visits          1 week ago Essential hypertension   [facility] [name], MD   2 months ago Acute cough   [facility] Bo Merino, FNP   3 months ago Essential hypertension   [facility] [name], MD   4 months ago Well adult exam   [facility] [name], MD   5 months ago Generalized abdominal pain   [facility] [name], MD      Future Appointments            In 2 weeks [name], MD [facility], [place]   In 3 months [name], MD [facility], [place]   In 7 months [name], MD [facility], [facility]

## 2021-02-13 ENCOUNTER — Other Ambulatory Visit: Payer: Self-pay | Admitting: Family Medicine

## 2021-02-13 DIAGNOSIS — F411 Generalized anxiety disorder: Secondary | ICD-10-CM

## 2021-02-13 MED ORDER — ESCITALOPRAM OXALATE 5 MG PO TABS: 5.0000 mg | ORAL_TABLET | Freq: Every day | ORAL | 0 refills | Status: DC

## 2021-02-24 ENCOUNTER — Ambulatory Visit: Payer: Self-pay

## 2021-02-24 NOTE — Telephone Encounter (Signed)
°  Chief Complaint: medication question Symptoms: drowsiness Frequency: NA Pertinent Negatives: NA Disposition: [] ED /[] Urgent Care (no appt availability in office) / [] Appointment(In office/virtual)/ []  [facility]/ [x] Home Care/ [] Refused Recommended Disposition /[] [place] [facility]/ []  Follow-up with PCP Additional Notes: advised pt she can try taking medication at night time and see if it helps decrease drowsiness    Summary: medication direction   Pt called in stating her medication escitalopram (LEXAPRO) 5 MG tablet  wanting to know if it can be taken at night instead of in the morning, please advise.      Reason for Disposition  Caller has medicine question only, adult not sick, AND triager answers question  Answer Assessment - Initial Assessment Questions 1. NAME of MEDICATION: "What medicine are you calling about?"     Escitalopram  2. QUESTION: "What is your question?" (e.g., double dose of medicine, side effect)     Wanting to take at night  3. PRESCRIBING HCP: "Who prescribed it?" Reason: if prescribed by specialist, call should be referred to that group.     [place] 4. SYMPTOMS: "Do you have any symptoms?"     drowsiness  Protocols used: Medication Question Call-A-AH

## 2021-02-26 NOTE — Progress Notes (Signed)
Name: Michelle Ware   MRN: [id number]    DOB: [birth year]   Date:02/27/2021       Progress Note  Subjective  Chief Complaint  FMLA  HPI  GAD: she saw me 01/31/2021 because of increase in anxiety, right before she came in she had started seeing a therapist , Farrel Gordon . We discussed medication but at the time she was not ready to start. She contact me back last week willing to take it, we gave her lexapro 5 mg to take in am's but it made feel sleepy so she resumed two nights at before bed, first night she slept all night but woke up last night around 4 am and could not go back to sleep.   She is a single mom of a 43 yo boy. She has been feeling overwhelmed for year . Worries about having to work Saturdays and not having daycare. She used to stay at her parents her house with her son on Fridays nights but in September she noticed 3 bites on her back, got very stressed about bed bugs. She went to the dermatologist, asked exterminator to check her home, however noticed increase in anxiety, inability to eat at times, was driving around to avoid going home. Still worries about taking bed bugs home, afraid to staying with her parents due to possibility of the bed bugs being present in their house. She states she has not been back to her parents house in months. She is afraid to spreading bed bugs and is avoiding going to her parents house She had a couple episodes of palpitation and feeling super anxious. She is always worried, if her son has a bump she thinks it is a bed bug, she has folliculitis she worries also that it is a bed bug   Dysthymia: she is getting sad now , she feels like her fear is keeping him from doing things, she kept him out of basketball. She has lack of appetite, losing weight, problems sleeping sometimes. Crying spells.   Patient Active Problem List   Diagnosis Date Noted   History of thyroidectomy 08/05/2017   Post-surgical hypothyroidism 03/25/2017   S/P cesarean section:  Indication: hx. of myomectomy and CHTN  09/28/2016   Tenosynovitis of wrist 05/21/2016   Morbid obesity with BMI of 40.0-44.9, adult ([place]) 03/31/2016   H/O myomectomy 03/11/2015   Essential hypertension 11/28/2014   Obesity, Class III, BMI 40-49.9 (morbid obesity) ([place]) 11/28/2014   Hypertrichosis 07/24/2014   Vitamin D deficiency 07/24/2014   PCOS (polycystic ovarian syndrome) 07/24/2014   History of abnormal cervical Pap smear 11/24/2011   Genital herpes 11/29/2008    Past Surgical History:  Procedure Laterality Date   BIOPSY THYROID  07/2015   Dr. Gabriel Carina   CESAREAN SECTION N/A 09/28/2016   Procedure: Primary CESAREAN SECTION;  Surgeon: Servando Salina, MD;  Location: [place];  Service: Obstetrics;  Laterality: N/A;  EDD: 10/19/16 Allergy: Hydrocodone-Acetaminophen   LAPAROSCOPIC GELPORT ASSISTED MYOMECTOMY N/A 03/11/2015   Procedure: LAPAROSCOPIC  MYOMECTOMY--attempted;  Surgeon: Rubie Maid, MD;  Location: [facility];  Service: Gynecology;  Laterality: N/A;   LAPAROTOMY N/A 03/11/2015   Procedure: LAPAROTOMY--MYOMECTOMY;  Surgeon: Rubie Maid, MD;  Location: [facility];  Service: Gynecology;  Laterality: N/A;   MOUTH SURGERY  1996   TOTAL THYROIDECTOMY Bilateral 03/05/2017   Dr. Maudie Mercury [facility]    Family History  Problem Relation Age of Onset   Thyroid disease Mother    Hypertension Mother    Cancer Maternal  Aunt    Cancer Maternal Grandfather    Prostate cancer Maternal Grandfather    Stroke Maternal Grandmother    Breast cancer Paternal Grandmother    Heart failure Paternal Grandfather    Liver disease Brother        Fatty Liver    Social History   Tobacco Use   Smoking status: Never   Smokeless tobacco: Never  Substance Use Topics   Alcohol use: Yes    Comment: occasionally, about once a month     Current Outpatient Medications:    docusate sodium (COLACE) 100 MG capsule, Take 1 capsule (100 mg total) by mouth daily as needed for mild constipation (for  iron supplement side effects)., Disp: 90 capsule, Rfl: 1   escitalopram (LEXAPRO) 5 MG tablet, Take 1 tablet (5 mg total) by mouth daily., Disp: 30 tablet, Rfl: 0   ferrous sulfate 325 (65 FE) MG EC tablet, Take 1 tablet (325 mg total) by mouth 3 (three) times daily with meals., Disp: 270 tablet, Rfl: 0   levothyroxine (SYNTHROID) 200 MCG tablet, TAKE 1 TABLET (200 MCG TOTAL) BY MOUTH DAILY BEFORE BREAKFAST. TAKE AN EXTRA HALF PILL 3 X WEEKLY (MON, WED, FRI FOR TOTAL OF 300 MCG), Disp: 40 tablet, Rfl: 5   norelgestromin-ethinyl estradiol (ORTHO EVRA) 150-35 MCG/24HR transdermal patch, Place 1 patch onto the skin once a week., Disp: 9 patch, Rfl: 3   omeprazole (PRILOSEC) 20 MG capsule, Take 1 capsule (20 mg total) by mouth 2 (two) times daily before a meal. For the first week take twice daily, second week before breast, after that every other day, Disp: 60 capsule, Rfl: 0   potassium chloride SA (KLOR-CON) 20 MEQ tablet, Take 1 tablet (20 mEq total) by mouth daily., Disp: 5 tablet, Rfl: 0   triamterene-hydrochlorothiazide (DYAZIDE) 37.5-25 MG capsule, Take 1 each (1 capsule total) by mouth daily., Disp: 90 capsule, Rfl: 1   valACYclovir (VALTREX) 500 MG tablet, TAKE 1 TABLET BY MOUTH 2 TIMES DAILY FOR OUTBREAKS AND ONCE DAILY FOR PREVENTION AS DIRECTED, Disp: 60 tablet, Rfl: 3   Vitamin D, Ergocalciferol, (DRISDOL) 1.25 MG (50000 UNIT) CAPS capsule, Take 1 capsule (50,000 Units total) by mouth every 7 (seven) days., Disp: 12 capsule, Rfl: 0  Allergies  Allergen Reactions   Hydrocodone Itching    I personally reviewed active problem list, medication list, allergies, family history, social history, health maintenance with the patient/caregiver today.   ROS  Constitutional: Negative for fever or weight change.  Respiratory: Negative for cough and shortness of breath.   Cardiovascular: Negative for chest pain or palpitations.  Gastrointestinal: Negative for abdominal pain, no bowel changes.   Musculoskeletal: Negative for gait problem or joint swelling.  Skin: Negative for rash.  Neurological: Negative for dizziness or headache.  No other specific complaints in a complete review of systems (except as listed in HPI above).   Objective  Vitals:   02/27/21 0835  BP: 118/82  Pulse: 85  Resp: 16  SpO2: 99%  Weight: 291 lb (132 kg)  Height: 5\' 10"  (1.778 m)    Body mass index is 41.75 kg/m.  Physical Exam  Constitutional: Patient appears well-developed and well-nourished. Obese  No distress.  HEENT: head atraumatic, normocephalic, pupils equal and reactive to light, neck supple Cardiovascular: Normal rate, regular rhythm and normal heart sounds.  No murmur heard. No BLE edema. Pulmonary/Chest: Effort normal and breath sounds normal. No respiratory distress. Abdominal: Soft.  There is no tenderness. Psychiatric: Patient has a normal  mood and affect. behavior is normal. Judgment and thought content normal.   PHQ2/9: Depression screen [facility] 2/9 02/27/2021 01/31/2021 11/25/2020 10/30/2020 09/19/2020  Decreased Interest 3 1 0 0 0  Down, Depressed, Hopeless 3 1 0 2 0  PHQ - 2 Score 6 2 0 2 0  Altered sleeping 1 0 - 0 -  Tired, decreased energy 2 1 - 0 -  Change in appetite 3 1 - 0 -  Feeling bad or failure about yourself  1 1 - 0 -  Trouble concentrating 3 1 - 0 -  Moving slowly or fidgety/restless 0 0 - 0 -  Suicidal thoughts 0 0 - 0 -  PHQ-9 Score 16 6 - 2 -  Difficult doing work/chores - Somewhat difficult - - -  Some recent data might be hidden    phq 9 is positive   Fall Risk: Fall Risk  02/27/2021 01/31/2021 11/25/2020 10/30/2020 09/19/2020  Falls in the past year? 0 0 0 0 0  Number falls in past yr: 0 0 0 0 0  Injury with Fall? 0 0 0 0 0  Risk for fall due to : No Fall Risks No Fall Risks - No Fall Risks No Fall Risks  Follow up Falls prevention discussed Falls prevention discussed Falls evaluation completed Falls prevention discussed Falls prevention discussed     Functional Status Survey: Is the patient deaf or have difficulty hearing?: No Does the patient have difficulty seeing, even when wearing glasses/contacts?: No Does the patient have difficulty concentrating, remembering, or making decisions?: Yes Does the patient have difficulty walking or climbing stairs?: No Does the patient have difficulty dressing or bathing?: No Does the patient have difficulty doing errands alone such as visiting a doctor's office or shopping?: No    Assessment & Plan  1. GAD (generalized anxiety disorder)   Discussed possible PTSD   Continue lexapro in the evenings and see if sleep will regulate   FMLA form filled out today    2. Dysthymia  Try lexapro, discussed referral to psychiatrist if no improvement with medication

## 2021-02-27 ENCOUNTER — Encounter: Payer: Self-pay | Admitting: Family Medicine

## 2021-02-27 ENCOUNTER — Ambulatory Visit: Payer: 59 | Admitting: Family Medicine

## 2021-02-27 VITALS — BP 118/82 | HR 85 | Resp 16 | Ht 70.0 in | Wt 291.0 lb

## 2021-02-27 DIAGNOSIS — F341 Dysthymic disorder: Secondary | ICD-10-CM

## 2021-02-27 DIAGNOSIS — F411 Generalized anxiety disorder: Secondary | ICD-10-CM | POA: Diagnosis not present

## 2021-03-04 ENCOUNTER — Ambulatory Visit: Payer: 59 | Admitting: Family Medicine

## 2021-03-07 ENCOUNTER — Other Ambulatory Visit: Payer: Self-pay | Admitting: Family Medicine

## 2021-03-07 DIAGNOSIS — F411 Generalized anxiety disorder: Secondary | ICD-10-CM

## 2021-03-18 ENCOUNTER — Encounter: Payer: Self-pay | Admitting: Family Medicine

## 2021-03-18 ENCOUNTER — Other Ambulatory Visit: Payer: Self-pay | Admitting: Family Medicine

## 2021-03-18 DIAGNOSIS — F411 Generalized anxiety disorder: Secondary | ICD-10-CM

## 2021-03-18 DIAGNOSIS — F341 Dysthymic disorder: Secondary | ICD-10-CM

## 2021-03-25 NOTE — Progress Notes (Signed)
Name: Michelle Ware   MRN: [id number]    DOB: 1980-04-29   Date:03/26/2021       Progress Note  Subjective  Chief Complaint  Follow Up  HPI  GAD: she saw me 01/31/2021 because of increase in anxiety, right before she came in she had started seeing a therapist , Farrel Gordon . Since last month she has been taking Lexapro at night and over the past two weeks she has been feeling much better however woke up feeling anxious a few days ago. Discussed going up on the dose and she is willing to try it   She is a single mom of a 61 yo boy. She has been feeling overwhelmed for year . Worries about having to work Saturdays and not having daycare. She used to stay at her parents her house with her son on Fridays nights but in September she noticed 3 bites on her back, got very stressed about bed bugs. She went to the dermatologist, asked exterminator to check her home, however noticed increase in anxiety, inability to eat at times, was driving around to avoid going home. Still worries about taking bed bugs home, afraid to staying with her parents due to possibility of the bed bugs being present in their house. She states she has not been back to her parents house in months. She is afraid to spreading bed bugs and is avoiding going to her parents house or getting another infection from being in other peoples home.  She had a couple episodes of palpitation and feeling super anxious. She is always worried, if her son has a bump she thinks it is a bed bug, she has folliculitis she worries also that it is a bed bug She states lately getting more help from her son's father over the weekend, she is feeling better at home but still not comfortable going to other homes. She does not have problems going to work or public places except church due to being so close together   Dysthymia: she is getting sad now , she feels like her fear is keeping him from doing things, like when  she kept him out of basketball.Crying spells  resolved, appetite is back to normal, she is able to focus.   Cyst neck: it was drained but is getting large again and has a subcutaneous slightly tender bump right below it, no fever or chills.   HTN: bp is at goal today,  she denies chest pain, dizziness, positive for intermittent palpitation but negative holter    Vitamin D deficiency: taking supplementation she stopped taking supplements and advised to resume it now  Anemia:, she states cycles were not as heavy but since delivery back in 09/2016 cycles are heavy, lasts 4-5 days, last levels back to normal. She has ortho evra patch at the pharmacy but has not started it yet    MOrbid Obesity: insurance denied medication for weight loss, and they don't cover bariatric   GERD: she states she changed her diet , packing her lunch and not having indigestion. Controlled with life style modifications, very seldom takes PPI   Post-surgical hypothyroidism: she states she has been taking medication every morning, Last tSH was at goal. Denies dysphagia, or change in bowel movement  Patient Active Problem List   Diagnosis Date Noted   History of thyroidectomy 08/05/2017   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 of myomectomy and CHTN  09/28/2016   Tenosynovitis of wrist 05/21/2016   Morbid obesity  with BMI of 40.0-44.9, adult ([place]) 03/31/2016   H/O myomectomy 03/11/2015   Essential hypertension 11/28/2014   Obesity, Class III, BMI 40-49.9 (morbid obesity) ([place]) 11/28/2014   Hypertrichosis 07/24/2014   Vitamin D deficiency 07/24/2014   PCOS (polycystic ovarian syndrome) 07/24/2014   History of abnormal cervical Pap smear 11/24/2011   Genital herpes 11/29/2008    Past Surgical History:  Procedure Laterality Date   BIOPSY THYROID  07/2015   Dr. Gabriel Carina   CESAREAN SECTION N/A 09/28/2016   Procedure: Primary CESAREAN SECTION;  Surgeon: Servando Salina, MD;  Location: [place];  Service:  Obstetrics;  Laterality: N/A;  EDD: 10/19/16 Allergy: Hydrocodone-Acetaminophen   LAPAROSCOPIC GELPORT ASSISTED MYOMECTOMY N/A 03/11/2015   Procedure: LAPAROSCOPIC  MYOMECTOMY--attempted;  Surgeon: Rubie Maid, MD;  Location: [facility];  Service: Gynecology;  Laterality: N/A;   LAPAROTOMY N/A 03/11/2015   Procedure: LAPAROTOMY--MYOMECTOMY;  Surgeon: Rubie Maid, MD;  Location: [facility];  Service: Gynecology;  Laterality: N/A;   MOUTH SURGERY  1996   TOTAL THYROIDECTOMY Bilateral 03/05/2017   Dr. Maudie Mercury [facility]    Family History  Problem Relation Age of Onset   Thyroid disease Mother    Hypertension Mother    Cancer Maternal Aunt    Cancer Maternal Grandfather    Prostate cancer Maternal Grandfather    Stroke Maternal Grandmother    Breast cancer Paternal Grandmother    Heart failure Paternal Grandfather    Liver disease Brother        Fatty Liver    Social History   Tobacco Use   Smoking status: Never   Smokeless tobacco: Never  Substance Use Topics   Alcohol use: Yes    Comment: occasionally, about once a month     Current Outpatient Medications:    docusate sodium (COLACE) 100 MG capsule, Take 1 capsule (100 mg total) by mouth daily as needed for mild constipation (for iron supplement side effects)., Disp: 90 capsule, Rfl: 1   escitalopram (LEXAPRO) 5 MG tablet, TAKE 1 TABLET (5 MG TOTAL) BY MOUTH DAILY., Disp: 3 tablet, Rfl: 0   ferrous sulfate 325 (65 FE) MG EC tablet, Take 1 tablet (325 mg total) by mouth 3 (three) times daily with meals., Disp: 270 tablet, Rfl: 0   levothyroxine (SYNTHROID) 200 MCG tablet, TAKE 1 TABLET (200 MCG TOTAL) BY MOUTH DAILY BEFORE BREAKFAST. TAKE AN EXTRA HALF PILL 3 X WEEKLY (MON, WED, FRI FOR TOTAL OF 300 MCG), Disp: 40 tablet, Rfl: 5   norelgestromin-ethinyl estradiol (ORTHO EVRA) 150-35 MCG/24HR transdermal patch, Place 1 patch onto the skin once a week., Disp: 9 patch, Rfl: 3   omeprazole (PRILOSEC) 20 MG capsule, Take 1 capsule (20 mg total)  by mouth 2 (two) times daily before a meal. For the first week take twice daily, second week before breast, after that every other day, Disp: 60 capsule, Rfl: 0   potassium chloride SA (KLOR-CON) 20 MEQ tablet, Take 1 tablet (20 mEq total) by mouth daily., Disp: 5 tablet, Rfl: 0   triamterene-hydrochlorothiazide (DYAZIDE) 37.5-25 MG capsule, Take 1 each (1 capsule total) by mouth daily., Disp: 90 capsule, Rfl: 1   valACYclovir (VALTREX) 500 MG tablet, TAKE 1 TABLET BY MOUTH 2 TIMES DAILY FOR OUTBREAKS AND ONCE DAILY FOR PREVENTION AS DIRECTED, Disp: 60 tablet, Rfl: 3   Vitamin D, Ergocalciferol, (DRISDOL) 1.25 MG (50000 UNIT) CAPS capsule, Take 1 capsule (50,000 Units total) by mouth every 7 (seven) days., Disp: 12 capsule, Rfl: 0  Allergies  Allergen Reactions  Hydrocodone Itching    I personally reviewed active problem list, medication list, allergies, family history, social history, health maintenance with the patient/caregiver today.   ROS  Constitutional: Negative for fever or weight change.  Respiratory: Negative for cough and shortness of breath.   Cardiovascular: Negative for chest pain or palpitations.  Gastrointestinal: Negative for abdominal pain, no bowel changes.  Musculoskeletal: Negative for gait problem or joint swelling.  Skin: Negative for rash.  Neurological: Negative for dizziness or headache.  No other specific complaints in a complete review of systems (except as listed in HPI above).   Objective  Vitals:   03/26/21 1441  BP: 132/74  Pulse: 98  Resp: 16  SpO2: 99%  Weight: 297 lb (134.7 kg)  Height: '5\' 10"'$  (1.778 m)    Body mass index is 42.62 kg/m.  Physical Exam  Constitutional: Patient appears well-developed and well-nourished. Obese  No distress.  HEENT: head atraumatic, normocephalic, pupils equal and reactive to light, neck supple, throat within normal limits hypertrichosis small cyst on right side of neck with a small cervical adenopathy right  below it  Cardiovascular: Normal rate, regular rhythm and normal heart sounds.  No murmur heard. No BLE edema. Pulmonary/Chest: Effort normal and breath sounds normal. No respiratory distress. Abdominal: Soft.  There is no tenderness. Psychiatric: Patient has a normal mood and affect. behavior is normal. Judgment and thought content normal.    PHQ2/9: Depression screen [facility] 2/9 03/26/2021 02/27/2021 01/31/2021 11/25/2020 10/30/2020  Decreased Interest '1 3 1 '$ 0 0  Down, Depressed, Hopeless '1 3 1 '$ 0 2  PHQ - 2 Score '2 6 2 '$ 0 2  Altered sleeping 0 1 0 - 0  Tired, decreased energy 0 2 1 - 0  Change in appetite 0 3 1 - 0  Feeling bad or failure about yourself  '1 1 1 '$ - 0  Trouble concentrating 0 3 1 - 0  Moving slowly or fidgety/restless 0 0 0 - 0  Suicidal thoughts 0 0 0 - 0  PHQ-9 Score '3 16 6 '$ - 2  Difficult doing work/chores - - Somewhat difficult - -  Some recent data might be hidden    phq 9 is positive  GAD 7 : Generalized Anxiety Score 01/31/2021 12/27/2017 10/27/2017  Nervous, Anxious, on Edge 1 0 0  Control/stop worrying 1 0 0  Worry too much - different things 1 1 0  Trouble relaxing 1 0 0  Restless 0 0 0  Easily annoyed or irritable 0 0 0  Afraid - awful might happen 1 0 0  Total GAD 7 Score 5 1 0  Anxiety Difficulty Somewhat difficult Not difficult at all Not difficult at all   Positive     Fall Risk: Fall Risk  03/26/2021 02/27/2021 01/31/2021 11/25/2020 10/30/2020  Falls in the past year? 0 0 0 0 0  Number falls in past yr: 0 0 0 0 0  Injury with Fall? 0 0 0 0 0  Risk for fall due to : No Fall Risks No Fall Risks No Fall Risks - No Fall Risks  Follow up Falls prevention discussed Falls prevention discussed Falls prevention discussed Falls evaluation completed Falls prevention discussed      Functional Status Survey: Is the patient deaf or have difficulty hearing?: No Does the patient have difficulty seeing, even when wearing glasses/contacts?: No Does the patient have  difficulty concentrating, remembering, or making decisions?: Yes Does the patient have difficulty walking or climbing stairs?: No Does the patient have  difficulty dressing or bathing?: No Does the patient have difficulty doing errands alone such as visiting a doctor's office or shopping?: No    Assessment & Plan  1. GAD (generalized anxiety disorder)  - escitalopram (LEXAPRO) 10 MG tablet; Take 1 tablet (10 mg total) by mouth daily.  Dispense: 90 tablet; Refill: 0  2. History of thyroidectomy   3. Dysthymia   4. Essential hypertension  At goal   5. Menorrhagia with regular cycle   6. Vitamin D deficiency  - Vitamin D, Ergocalciferol, (DRISDOL) 1.25 MG (50000 UNIT) CAPS capsule; Take 1 capsule (50,000 Units total) by mouth every 7 (seven) days.  Dispense: 12 capsule; Refill: 1

## 2021-03-26 ENCOUNTER — Encounter: Payer: Self-pay | Admitting: Family Medicine

## 2021-03-26 ENCOUNTER — Other Ambulatory Visit: Payer: Self-pay

## 2021-03-26 ENCOUNTER — Ambulatory Visit: Payer: 59 | Admitting: Family Medicine

## 2021-03-26 VITALS — BP 132/74 | HR 98 | Resp 16 | Ht 70.0 in | Wt 297.0 lb

## 2021-03-26 DIAGNOSIS — E890 Postprocedural hypothyroidism: Secondary | ICD-10-CM

## 2021-03-26 DIAGNOSIS — N920 Excessive and frequent menstruation with regular cycle: Secondary | ICD-10-CM

## 2021-03-26 DIAGNOSIS — F411 Generalized anxiety disorder: Secondary | ICD-10-CM | POA: Diagnosis not present

## 2021-03-26 DIAGNOSIS — F341 Dysthymic disorder: Secondary | ICD-10-CM | POA: Diagnosis not present

## 2021-03-26 DIAGNOSIS — E559 Vitamin D deficiency, unspecified: Secondary | ICD-10-CM

## 2021-03-26 DIAGNOSIS — I10 Essential (primary) hypertension: Secondary | ICD-10-CM | POA: Diagnosis not present

## 2021-03-26 MED ORDER — ESCITALOPRAM OXALATE 10 MG PO TABS: 10.0000 mg | ORAL_TABLET | Freq: Every day | ORAL | 0 refills | Status: DC

## 2021-03-26 MED ORDER — VITAMIN D (ERGOCALCIFEROL) 1.25 MG (50000 UNIT) PO CAPS: 50000.0000 [IU] | ORAL_CAPSULE | ORAL | 1 refills | Status: DC

## 2021-05-09 ENCOUNTER — Ambulatory Visit: Payer: 59 | Admitting: Nurse Practitioner

## 2021-05-14 ENCOUNTER — Ambulatory Visit: Payer: 59 | Admitting: Nurse Practitioner

## 2021-05-16 ENCOUNTER — Ambulatory Visit: Payer: 59 | Admitting: Family Medicine

## 2021-06-02 NOTE — Progress Notes (Deleted)
Name: Michelle Ware   MRN: [id number]    DOB: 06-Nov-1980   Date:06/02/2021       Progress Note  Subjective  Chief Complaint  Acute visit for discoloration/bruising on legs  HPI  *** Patient Active Problem List   Diagnosis Date Noted   History of thyroidectomy 08/05/2017   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 of myomectomy and CHTN  09/28/2016   Tenosynovitis of wrist 05/21/2016   Morbid obesity with BMI of 40.0-44.9, adult ([place]) 03/31/2016   H/O myomectomy 03/11/2015   Essential hypertension 11/28/2014   Hypertrichosis 07/24/2014   Vitamin D deficiency 07/24/2014   PCOS (polycystic ovarian syndrome) 07/24/2014   History of abnormal cervical Pap smear 11/24/2011   Genital herpes 11/29/2008    Past Surgical History:  Procedure Laterality Date   BIOPSY THYROID  07/2015   Dr. Gabriel Carina   CESAREAN SECTION N/A 09/28/2016   Procedure: Primary CESAREAN SECTION;  Surgeon: Servando Salina, MD;  Location: [place];  Service: Obstetrics;  Laterality: N/A;  EDD: 10/19/16 Allergy: Hydrocodone-Acetaminophen   LAPAROSCOPIC GELPORT ASSISTED MYOMECTOMY N/A 03/11/2015   Procedure: LAPAROSCOPIC  MYOMECTOMY--attempted;  Surgeon: Rubie Maid, MD;  Location: [facility];  Service: Gynecology;  Laterality: N/A;   LAPAROTOMY N/A 03/11/2015   Procedure: LAPAROTOMY--MYOMECTOMY;  Surgeon: Rubie Maid, MD;  Location: [facility];  Service: Gynecology;  Laterality: N/A;   MOUTH SURGERY  1996   TOTAL THYROIDECTOMY Bilateral 03/05/2017   Dr. Maudie Mercury [facility]    Family History  Problem Relation Age of Onset   Thyroid disease Mother    Hypertension Mother    Cancer Maternal Aunt    Cancer Maternal Grandfather    Prostate cancer Maternal Grandfather    Stroke Maternal Grandmother    Breast cancer Paternal Grandmother    Heart failure Paternal Grandfather    Liver disease Brother        Fatty Liver    Social History   Tobacco Use   Smoking status: Never    Smokeless tobacco: Never  Substance Use Topics   Alcohol use: Yes    Comment: occasionally, about once a month     Current Outpatient Medications:    docusate sodium (COLACE) 100 MG capsule, Take 1 capsule (100 mg total) by mouth daily as needed for mild constipation (for iron supplement side effects)., Disp: 90 capsule, Rfl: 1   escitalopram (LEXAPRO) 10 MG tablet, Take 1 tablet (10 mg total) by mouth daily., Disp: 90 tablet, Rfl: 0   ferrous sulfate 325 (65 FE) MG EC tablet, Take 1 tablet (325 mg total) by mouth 3 (three) times daily with meals., Disp: 270 tablet, Rfl: 0   levothyroxine (SYNTHROID) 200 MCG tablet, TAKE 1 TABLET (200 MCG TOTAL) BY MOUTH DAILY BEFORE BREAKFAST. TAKE AN EXTRA HALF PILL 3 X WEEKLY (MON, WED, FRI FOR TOTAL OF 300 MCG), Disp: 40 tablet, Rfl: 5   norelgestromin-ethinyl estradiol (ORTHO EVRA) 150-35 MCG/24HR transdermal patch, Place 1 patch onto the skin once a week., Disp: 9 patch, Rfl: 3   omeprazole (PRILOSEC) 20 MG capsule, Take 1 capsule (20 mg total) by mouth 2 (two) times daily before a meal. For the first week take twice daily, second week before breast, after that every other day, Disp: 60 capsule, Rfl: 0   triamterene-hydrochlorothiazide (DYAZIDE) 37.5-25 MG capsule, Take 1 each (1 capsule total) by mouth daily., Disp: 90 capsule, Rfl: 1   valACYclovir (VALTREX) 500 MG tablet, TAKE 1 TABLET BY MOUTH 2 TIMES DAILY FOR  OUTBREAKS AND ONCE DAILY FOR PREVENTION AS DIRECTED, Disp: 60 tablet, Rfl: 3   Vitamin D, Ergocalciferol, (DRISDOL) 1.25 MG (50000 UNIT) CAPS capsule, Take 1 capsule (50,000 Units total) by mouth every 7 (seven) days., Disp: 12 capsule, Rfl: 1  Allergies  Allergen Reactions   Hydrocodone Itching    I personally reviewed {Reviewed:14835} with the patient/caregiver today.   ROS  ***  Objective  There were no vitals filed for this visit.  There is no height or weight on file to calculate BMI.  Physical Exam ***  No results found  for this or any previous visit (from the past 2160 hour(s)).  Diabetic Foot Exam: Diabetic Foot Exam - Simple   No data filed    ***  PHQ2/9:    03/26/2021    2:45 PM 02/27/2021    8:15 AM 01/31/2021    2:55 PM 11/25/2020    8:20 AM 10/30/2020    9:07 AM  Depression screen PHQ 2/9  Decreased Interest '1 3 1 '$ 0 0  Down, Depressed, Hopeless '1 3 1 '$ 0 2  PHQ - 2 Score '2 6 2 '$ 0 2  Altered sleeping 0 1 0  0  Tired, decreased energy 0 2 1  0  Change in appetite 0 3 1  0  Feeling bad or failure about yourself  '1 1 1  '$ 0  Trouble concentrating 0 3 1  0  Moving slowly or fidgety/restless 0 0 0  0  Suicidal thoughts 0 0 0  0  PHQ-9 Score '3 16 6  2  '$ Difficult doing work/chores   Somewhat difficult      phq 9 is {gen pos ULA:453646} ***  Fall Risk:    03/26/2021    2:41 PM 02/27/2021    8:14 AM 01/31/2021    2:55 PM 11/25/2020    8:19 AM 10/30/2020    9:07 AM  [place] in the past year? 0 0 0 0 0  Number falls in past yr: 0 0 0 0 0  Injury with Fall? 0 0 0 0 0  Risk for fall due to : No Fall Risks No Fall Risks No Fall Risks  No Fall Risks  Follow up Falls prevention discussed Falls prevention discussed Falls prevention discussed Falls evaluation completed Falls prevention discussed   ***   Functional Status Survey:   ***   Assessment & Plan  *** There are no diagnoses linked to this encounter.

## 2021-06-03 ENCOUNTER — Ambulatory Visit: Payer: 59 | Admitting: Family Medicine

## 2021-06-09 ENCOUNTER — Encounter: Payer: Self-pay | Admitting: Family Medicine

## 2021-06-10 ENCOUNTER — Other Ambulatory Visit: Payer: Self-pay

## 2021-06-10 DIAGNOSIS — F411 Generalized anxiety disorder: Secondary | ICD-10-CM

## 2021-06-10 DIAGNOSIS — F341 Dysthymic disorder: Secondary | ICD-10-CM

## 2021-07-02 NOTE — Progress Notes (Signed)
Name: Michelle Ware   MRN: [id number]    DOB: 04/22/1980   Date:07/03/2021       Progress Note  Subjective  Chief Complaint  Follow up   HPI  GAD: she saw me 01/31/2021 because of increase in anxiety, she is still seeing therapist  Farrel Gordon . She has been taking lexapro and is doing much better   Dysthymia: lexapro seems to be working, no longer having crying spells, sadness has resolved. Feeling better. She is no longer feeling as frightened about letting her son stay with her parents. She still worries about bugs but not like it used to be   HTN: bp is at goal today,  she denies chest pain, dizziness, positive for intermittent palpitation but negative holter - episodes very seldom now    Vitamin D deficiency: taking supplementation she stopped taking supplements and advised to resume it now  Anemia:, she states cycles were not as heavy but since delivery back in 09/2016 cycles are heavy, lasts 4-5 days, last levels back to normal. She has ortho evra patch at the pharmacy but has not covered by insurance She does not want to take ocp's at this time    Morbid Obesity: insurance denied medication for weight loss, and they don't cover bariatric surgery. She had lost a lot of weight when depressed, but now that mood has improved her weight is going up, baseline weight was 331 lbs , dropped to below 300 lbs and is now 320 lbs. She states she will start doing home exercises. Discussed portion control and intermittent fasting . She has prediabetes and discussed a low carb diet also   GERD: she states she changed her diet , packing her lunch and not having indigestion. Controlled with life style modifications, very seldom takes PPI. Unchanged    Post-surgical hypothyroidism: she states she has been taking medication every morning, Last TSH was at goal. Denies dysphagia, or change in bowel movement. She has gained weight since last visit and we will recheck TSH  Patient Active Problem List    Diagnosis Date Noted   History of thyroidectomy 08/05/2017   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 of myomectomy and CHTN  09/28/2016   Tenosynovitis of wrist 05/21/2016   Morbid obesity with BMI of 40.0-44.9, adult ([place]) 03/31/2016   H/O myomectomy 03/11/2015   Essential hypertension 11/28/2014   Hypertrichosis 07/24/2014   Vitamin D deficiency 07/24/2014   PCOS (polycystic ovarian syndrome) 07/24/2014   History of abnormal cervical Pap smear 11/24/2011   Genital herpes 11/29/2008    Past Surgical History:  Procedure Laterality Date   BIOPSY THYROID  07/2015   Dr. Gabriel Carina   CESAREAN SECTION N/A 09/28/2016   Procedure: Primary CESAREAN SECTION;  Surgeon: Servando Salina, MD;  Location: [place];  Service: Obstetrics;  Laterality: N/A;  EDD: 10/19/16 Allergy: Hydrocodone-Acetaminophen   LAPAROSCOPIC GELPORT ASSISTED MYOMECTOMY N/A 03/11/2015   Procedure: LAPAROSCOPIC  MYOMECTOMY--attempted;  Surgeon: Rubie Maid, MD;  Location: [facility];  Service: Gynecology;  Laterality: N/A;   LAPAROTOMY N/A 03/11/2015   Procedure: LAPAROTOMY--MYOMECTOMY;  Surgeon: Rubie Maid, MD;  Location: [facility];  Service: Gynecology;  Laterality: N/A;   MOUTH SURGERY  1996   TOTAL THYROIDECTOMY Bilateral 03/05/2017   Dr. Maudie Mercury [facility]    Family History  Problem Relation Age of Onset   Thyroid disease Mother    Hypertension Mother    Cancer Maternal Aunt    Cancer Maternal Grandfather    Prostate  cancer Maternal Grandfather    Stroke Maternal Grandmother    Breast cancer Paternal Grandmother    Heart failure Paternal Grandfather    Liver disease Brother        Fatty Liver    Social History   Tobacco Use   Smoking status: Never   Smokeless tobacco: Never  Substance Use Topics   Alcohol use: Yes    Comment: occasionally, about once a month     Current Outpatient Medications:    docusate sodium (COLACE) 100 MG capsule, Take 1 capsule (100 mg  total) by mouth daily as needed for mild constipation (for iron supplement side effects)., Disp: 90 capsule, Rfl: 1   escitalopram (LEXAPRO) 10 MG tablet, Take 1 tablet (10 mg total) by mouth daily., Disp: 90 tablet, Rfl: 0   ferrous sulfate 325 (65 FE) MG EC tablet, Take 1 tablet (325 mg total) by mouth 3 (three) times daily with meals., Disp: 270 tablet, Rfl: 0   levothyroxine (SYNTHROID) 200 MCG tablet, TAKE 1 TABLET (200 MCG TOTAL) BY MOUTH DAILY BEFORE BREAKFAST. TAKE AN EXTRA HALF PILL 3 X WEEKLY (MON, WED, FRI FOR TOTAL OF 300 MCG), Disp: 40 tablet, Rfl: 5   norelgestromin-ethinyl estradiol (ORTHO EVRA) 150-35 MCG/24HR transdermal patch, Place 1 patch onto the skin once a week., Disp: 9 patch, Rfl: 3   omeprazole (PRILOSEC) 20 MG capsule, Take 1 capsule (20 mg total) by mouth 2 (two) times daily before a meal. For the first week take twice daily, second week before breast, after that every other day, Disp: 60 capsule, Rfl: 0   triamterene-hydrochlorothiazide (DYAZIDE) 37.5-25 MG capsule, Take 1 each (1 capsule total) by mouth daily., Disp: 90 capsule, Rfl: 1   valACYclovir (VALTREX) 500 MG tablet, TAKE 1 TABLET BY MOUTH 2 TIMES DAILY FOR OUTBREAKS AND ONCE DAILY FOR PREVENTION AS DIRECTED, Disp: 60 tablet, Rfl: 3   Vitamin D, Ergocalciferol, (DRISDOL) 1.25 MG (50000 UNIT) CAPS capsule, Take 1 capsule (50,000 Units total) by mouth every 7 (seven) days., Disp: 12 capsule, Rfl: 1  Allergies  Allergen Reactions   Hydrocodone Itching    I personally reviewed active problem list, medication list, allergies, family history, social history, health maintenance with the patient/caregiver today.   ROS  Constitutional: Negative for fever or weight change.  Respiratory: Negative for cough and shortness of breath.   Cardiovascular: Negative for chest pain or palpitations.  Gastrointestinal: Negative for abdominal pain, no bowel changes.  Musculoskeletal: Negative for gait problem or joint swelling.   Skin: Negative for rash.  Neurological: Negative for dizziness or headache.  No other specific complaints in a complete review of systems (except as listed in HPI above).   Objective  Vitals:   07/03/21 1521  BP: 128/74  Pulse: 88  Resp: 18  Temp: 98.3 F (36.8 C)  TempSrc: Oral  SpO2: 98%  Weight: (!) 320 lb 8 oz (145.4 kg)  Height: '5\' 10"'$  (1.778 m)    Body mass index is 45.99 kg/m.  Physical Exam  Constitutional: Patient appears well-developed and well-nourished. Obese  No distress.  HEENT: head atraumatic, normocephalic, pupils equal and reactive to light, neck supple Cardiovascular: Normal rate, regular rhythm and normal heart sounds.  No murmur heard. No BLE edema. Pulmonary/Chest: Effort normal and breath sounds normal. No respiratory distress. Abdominal: Soft.  There is no tenderness. Psychiatric: Patient has a normal mood and affect. behavior is normal. Judgment and thought content normal.    PHQ2/9:    07/03/2021  3:23 PM 03/26/2021    2:45 PM 02/27/2021    8:15 AM 01/31/2021    2:55 PM 11/25/2020    8:20 AM  Depression screen PHQ 2/9  Decreased Interest 0 '1 3 1 '$ 0  Down, Depressed, Hopeless 0 '1 3 1 '$ 0  PHQ - 2 Score 0 '2 6 2 '$ 0  Altered sleeping 0 0 1 0   Tired, decreased energy 2 0 2 1   Change in appetite 1 0 3 1   Feeling bad or failure about yourself  0 '1 1 1   '$ Trouble concentrating 0 0 3 1   Moving slowly or fidgety/restless 0 0 0 0   Suicidal thoughts 0 0 0 0   PHQ-9 Score '3 3 16 6   '$ Difficult doing work/chores Not difficult at all   Somewhat difficult     phq 9 is negative   Fall Risk:    07/03/2021    3:22 PM 03/26/2021    2:41 PM 02/27/2021    8:14 AM 01/31/2021    2:55 PM 11/25/2020    8:19 AM  Fall Risk   Falls in the past year? 0 0 0 0 0  Number falls in past yr:  0 0 0 0  Injury with Fall?  0 0 0 0  Risk for fall due to : No Fall Risks No Fall Risks No Fall Risks No Fall Risks   Follow up Falls prevention discussed Falls prevention  discussed Falls prevention discussed Falls prevention discussed Falls evaluation completed     Functional Status Survey: Is the patient deaf or have difficulty hearing?: No Does the patient have difficulty seeing, even when wearing glasses/contacts?: No Does the patient have difficulty concentrating, remembering, or making decisions?: No Does the patient have difficulty walking or climbing stairs?: No Does the patient have difficulty dressing or bathing?: No Does the patient have difficulty doing errands alone such as visiting a doctor's office or shopping?: No    Assessment & Plan  1. Post-surgical hypothyroidism  - TSH  2. Essential hypertension  - triamterene-hydrochlorothiazide (DYAZIDE) 37.5-25 MG capsule; Take 1 each (1 capsule total) by mouth daily.  Dispense: 90 capsule; Refill: 1  3. GAD (generalized anxiety disorder)  - escitalopram (LEXAPRO) 10 MG tablet; Take 1 tablet (10 mg total) by mouth daily.  Dispense: 90 tablet; Refill: 0  4. Dysthymia  - escitalopram (LEXAPRO) 10 MG tablet; Take 1 tablet (10 mg total) by mouth daily.  Dispense: 90 tablet; Refill: 0    5. History of thyroidectomy   6. Vitamin D deficiency   7. Gastroesophageal reflux disease without esophagitis   8. Morbid obesity with BMI of 40.0-44.9, adult [facility])  Discussed with the patient the risk posed by an increased BMI. Discussed importance of portion control, calorie counting and at least 150 minutes of physical activity weekly. Avoid sweet beverages and drink more water. Eat at least 6 servings of fruit and vegetables daily

## 2021-07-03 ENCOUNTER — Encounter: Payer: Self-pay | Admitting: Family Medicine

## 2021-07-03 ENCOUNTER — Ambulatory Visit: Payer: 59 | Admitting: Family Medicine

## 2021-07-03 VITALS — BP 128/74 | HR 88 | Temp 98.3°F | Resp 18 | Ht 70.0 in | Wt 320.5 lb

## 2021-07-03 DIAGNOSIS — E890 Postprocedural hypothyroidism: Secondary | ICD-10-CM | POA: Diagnosis not present

## 2021-07-03 DIAGNOSIS — F341 Dysthymic disorder: Secondary | ICD-10-CM | POA: Diagnosis not present

## 2021-07-03 DIAGNOSIS — Z6841 Body Mass Index (BMI) 40.0 and over, adult: Secondary | ICD-10-CM

## 2021-07-03 DIAGNOSIS — E559 Vitamin D deficiency, unspecified: Secondary | ICD-10-CM

## 2021-07-03 DIAGNOSIS — I10 Essential (primary) hypertension: Secondary | ICD-10-CM

## 2021-07-03 DIAGNOSIS — F411 Generalized anxiety disorder: Secondary | ICD-10-CM | POA: Diagnosis not present

## 2021-07-03 DIAGNOSIS — K219 Gastro-esophageal reflux disease without esophagitis: Secondary | ICD-10-CM

## 2021-07-03 DIAGNOSIS — F419 Anxiety disorder, unspecified: Secondary | ICD-10-CM | POA: Insufficient documentation

## 2021-07-03 MED ORDER — TRIAMTERENE-HCTZ 37.5-25 MG PO CAPS: 1.0000 | ORAL_CAPSULE | Freq: Every day | ORAL | 1 refills | Status: DC

## 2021-07-03 MED ORDER — ESCITALOPRAM OXALATE 10 MG PO TABS: 10.0000 mg | ORAL_TABLET | Freq: Every day | ORAL | 0 refills | Status: DC

## 2021-07-07 ENCOUNTER — Ambulatory Visit: Payer: 59 | Admitting: Family Medicine

## 2021-08-04 ENCOUNTER — Other Ambulatory Visit: Payer: Self-pay | Admitting: Family Medicine

## 2021-08-04 DIAGNOSIS — A6000 Herpesviral infection of urogenital system, unspecified: Secondary | ICD-10-CM

## 2021-08-05 ENCOUNTER — Encounter: Payer: Self-pay | Admitting: Family Medicine

## 2021-08-06 MED ORDER — VALACYCLOVIR HCL 500 MG PO TABS: ORAL_TABLET | ORAL | 3 refills | Status: DC

## 2021-08-06 NOTE — Addendum Note (Signed)
Addended by: Matilde Sprang on: 08/06/2021 06:05 PM   Modules accepted: Orders

## 2021-08-06 NOTE — Telephone Encounter (Signed)
Resent as provider ordered.  Requested Prescriptions  Pending Prescriptions Disp Refills  . valACYclovir (VALTREX) 500 MG tablet 60 tablet 3    Sig: TAKE 1 TABLET BY MOUTH 2 TIMES DAILY FOR OUTBREAKS AND ONCE DAILY FOR PREVENTION AS DIRECTED     Antimicrobials:  Antiviral Agents - Anti-Herpetic Passed - 08/06/2021  6:05 PM      Passed - Valid encounter within last 12 months    Recent Outpatient Visits          1 month ago Post-surgical hypothyroidism   [facility] [name], MD   4 months ago History of thyroidectomy   [facility] [name], MD   5 months ago GAD (generalized anxiety disorder)   [facility] [name], MD   6 months ago Essential hypertension   [facility] [name], MD   8 months ago Acute cough   [facility] Bo Merino, FNP      Future Appointments            In 1 month [name], MD [facility] [place]   In 1 month [name], MD [facility]           Signed Prescriptions Disp Refills   valACYclovir (VALTREX) 500 MG tablet 60 tablet 3    Sig: TAKE 1 TABLET BY MOUTH 2 TIMES DAILY FOR OUTBREAKS AND ONCE DAILY FOR PREVENTION AS DIRECTED     Antimicrobials:  Antiviral Agents - Anti-Herpetic Passed - 08/04/2021  3:48 PM      Passed - Valid encounter within last 12 months    Recent Outpatient Visits          1 month ago Post-surgical hypothyroidism   [facility] [name], MD   4 months ago History of thyroidectomy   [facility] [name], MD   5 months ago GAD (generalized anxiety disorder)   [facility] [name], MD   6 months ago Essential hypertension   [facility] [name], MD   8 months ago Acute cough   [facility] Bo Merino, FNP       Future Appointments            In 1 month [name], MD [facility] [place]   In 1 month [name], MD [facility]

## 2021-08-06 NOTE — Telephone Encounter (Addendum)
Medication Refill - Medication: valACYclovir (VALTREX) 500 MG tablet E scribe transmission failed. Has the patient contacted their pharmacy? yes (Agent: If no, request that the patient contact the pharmacy for the refill. If patient does not wish to contact the pharmacy document the reason why and proceed with request.) (Agent: If yes, when and what did the pharmacy advise?)contact pcp  Preferred Pharmacy (with phone number or street name):  [id number] Phone:  [phone] Fax:  [phone]    Has the patient been seen for an appointment in the last year OR does the patient have an upcoming appointment? yes  Agent: Please be advised that RX refills may take up to 3 business days. We ask that you follow-up with your pharmacy.

## 2021-08-10 ENCOUNTER — Encounter: Payer: Self-pay | Admitting: Family Medicine

## 2021-08-11 NOTE — Progress Notes (Unsigned)
Name: Michelle Ware   MRN: [id number]    DOB: 05/19/1980   Date:08/12/2021       Progress Note  Subjective  Chief Complaint  Sleep Apnea  I connected with  Michelle Ware  on 08/12/21 at  2:20 PM EDT by a video enabled telemedicine application and verified that I am speaking with the correct person using two identifiers.  I discussed the limitations of evaluation and management by telemedicine and the availability of in person appointments. The patient expressed understanding and agreed to proceed with the virtual visit  Staff also discussed with the patient that there may be a patient responsible charge related to this service. Patient Location: at work  Provider Location: [facility] Additional Individuals present: alone   HPI  Snoring: she states her son had noticed that she snores in the past, this past weekend a friend noticed that she snores also has pauses during her sleep. She usually wakes up and feels well but feels tired late in the afternoon. She denies morning headaches. She has risk factors for OSA such as HTN and obesity   ESS today was 10   Patient Active Problem List   Diagnosis Date Noted   Gastroesophageal reflux disease without esophagitis 07/03/2021   Anxiety 07/03/2021   GAD (generalized anxiety disorder) 07/03/2021   Dysthymia 07/03/2021   History of thyroidectomy 08/05/2017   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 of myomectomy and CHTN  09/28/2016   Tenosynovitis of wrist 05/21/2016   Morbid obesity with BMI of 40.0-44.9, adult ([place]) 03/31/2016   H/O myomectomy 03/11/2015   Essential hypertension 11/28/2014   Hypertrichosis 07/24/2014   Vitamin D deficiency 07/24/2014   PCOS (polycystic ovarian syndrome) 07/24/2014   History of abnormal cervical Pap smear 11/24/2011   Genital herpes 11/29/2008    Past Surgical History:  Procedure Laterality Date   BIOPSY THYROID  07/2015   Dr. Gabriel Carina   CESAREAN SECTION N/A 09/28/2016    Procedure: Primary CESAREAN SECTION;  Surgeon: Servando Salina, MD;  Location: [place];  Service: Obstetrics;  Laterality: N/A;  EDD: 10/19/16 Allergy: Hydrocodone-Acetaminophen   LAPAROSCOPIC GELPORT ASSISTED MYOMECTOMY N/A 03/11/2015   Procedure: LAPAROSCOPIC  MYOMECTOMY--attempted;  Surgeon: Rubie Maid, MD;  Location: [facility];  Service: Gynecology;  Laterality: N/A;   LAPAROTOMY N/A 03/11/2015   Procedure: LAPAROTOMY--MYOMECTOMY;  Surgeon: Rubie Maid, MD;  Location: [facility];  Service: Gynecology;  Laterality: N/A;   MOUTH SURGERY  1996   TOTAL THYROIDECTOMY Bilateral 03/05/2017   Dr. Maudie Mercury [facility]    Family History  Problem Relation Age of Onset   Thyroid disease Mother    Hypertension Mother    Cancer Maternal Aunt    Cancer Maternal Grandfather    Prostate cancer Maternal Grandfather    Stroke Maternal Grandmother    Breast cancer Paternal Grandmother    Heart failure Paternal Grandfather    Liver disease Brother        Fatty Liver    Social History   Socioeconomic History   Marital status: Single    Spouse name: Not on file   Number of children: 1   Years of education: Not on file   Highest education level: Bachelor's degree (e.g., BA, AB, BS)  Occupational History   Occupation: Glass blower/designer    Comment: works for [employer] in [employer]  Tobacco Use   Smoking status: Never   Smokeless tobacco: Never  Vaping Use   Vaping Use: Never used  Substance and  Sexual Activity   Alcohol use: Yes    Comment: occasionally, about once a month   Drug use: No   Sexual activity: Not Currently    Partners: Male    Birth control/protection: Condom  Other Topics Concern   Not on file  Social History Narrative   Lives by herself, broke up with Carloyn Manner, but they had a son 09/28/2016 and he is involved in their son's life   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   Social Determinants of Health   Financial Resource Strain:  Low Risk  (09/19/2020)   Overall Financial Resource Strain (CARDIA)    Difficulty of Paying Living Expenses: Not hard at all  Food Insecurity: No Food Insecurity (09/19/2020)   Hunger Vital Sign    Worried About Running Out of Food in the Last Year: Never true    Ran Out of Food in the Last Year: Never true  Transportation Needs: No Transportation Needs (09/19/2020)   PRAPARE - Hydrologist (Medical): No    Lack of Transportation (Non-Medical): No  Physical Activity: Inactive (09/19/2020)   Exercise Vital Sign    Days of Exercise per Week: 0 days    Minutes of Exercise per Session: 0 min  Stress: No Stress Concern Present (09/19/2020)   [place]    Feeling of Stress : Not at all  Social Connections: Moderately Isolated (09/19/2020)   Social Connection and Isolation Panel [NHANES]    Frequency of Communication with Friends and Family: More than three times a week    Frequency of Social Gatherings with Friends and Family: Once a week    Attends Religious Services: More than 4 times per year    Active Member of [employer] or Organizations: No    Attends Archivist Meetings: Never    Marital Status: Never married  Intimate Partner Violence: Not At Risk (09/19/2020)   Humiliation, Afraid, Rape, and Kick questionnaire    Fear of Current or Ex-Partner: No    Emotionally Abused: No    Physically Abused: No    Sexually Abused: No     Current Outpatient Medications:    docusate sodium (COLACE) 100 MG capsule, Take 1 capsule (100 mg total) by mouth daily as needed for mild constipation (for iron supplement side effects)., Disp: 90 capsule, Rfl: 1   escitalopram (LEXAPRO) 10 MG tablet, Take 1 tablet (10 mg total) by mouth daily., Disp: 90 tablet, Rfl: 0   ferrous sulfate 325 (65 FE) MG EC tablet, Take 1 tablet (325 mg total) by mouth 3 (three) times daily with meals., Disp: 270 tablet, Rfl: 0    levothyroxine (SYNTHROID) 200 MCG tablet, TAKE 1 TABLET (200 MCG TOTAL) BY MOUTH DAILY BEFORE BREAKFAST. TAKE AN EXTRA HALF PILL 3 X WEEKLY (MON, WED, FRI FOR TOTAL OF 300 MCG), Disp: 40 tablet, Rfl: 5   norelgestromin-ethinyl estradiol (ORTHO EVRA) 150-35 MCG/24HR transdermal patch, Place 1 patch onto the skin once a week., Disp: 9 patch, Rfl: 3   omeprazole (PRILOSEC) 20 MG capsule, Take 1 capsule (20 mg total) by mouth 2 (two) times daily before a meal. For the first week take twice daily, second week before breast, after that every other day, Disp: 60 capsule, Rfl: 0   triamterene-hydrochlorothiazide (DYAZIDE) 37.5-25 MG capsule, Take 1 each (1 capsule total) by mouth daily., Disp: 90 capsule, Rfl: 1   valACYclovir (VALTREX) 500 MG tablet,  TAKE 1 TABLET BY MOUTH 2 TIMES DAILY FOR OUTBREAKS AND ONCE DAILY FOR PREVENTION AS DIRECTED, Disp: 60 tablet, Rfl: 3   Vitamin D, Ergocalciferol, (DRISDOL) 1.25 MG (50000 UNIT) CAPS capsule, Take 1 capsule (50,000 Units total) by mouth every 7 (seven) days., Disp: 12 capsule, Rfl: 1  Allergies  Allergen Reactions   Hydrocodone Itching    I personally reviewed active problem list, medication list, allergies, family history, social history, health maintenance with the patient/caregiver today.   ROS  Ten systems reviewed and is negative except as mentioned in HPI   Objective  Virtual encounter, vitals not obtained.  There is no height or weight on file to calculate BMI.  Physical Exam  Awake, alert and oriented   PHQ2/9:    08/12/2021   12:12 PM 07/03/2021    3:23 PM 03/26/2021    2:45 PM 02/27/2021    8:15 AM 01/31/2021    2:55 PM  Depression screen PHQ 2/9  Decreased Interest 0 0 '1 3 1  '$ Down, Depressed, Hopeless 0 0 '1 3 1  '$ PHQ - 2 Score 0 0 '2 6 2  '$ Altered sleeping  0 0 1 0  Tired, decreased energy  2 0 2 1  Change in appetite  1 0 3 1  Feeling bad or failure about yourself   0 '1 1 1  '$ Trouble concentrating  0 0 3 1  Moving slowly or  fidgety/restless  0 0 0 0  Suicidal thoughts  0 0 0 0  PHQ-9 Score  '3 3 16 6  '$ Difficult doing work/chores  Not difficult at all   Somewhat difficult   PHQ-2/9 Result is negative.    Fall Risk:    08/12/2021   12:11 PM 07/03/2021    3:22 PM 03/26/2021    2:41 PM 02/27/2021    8:14 AM 01/31/2021    2:55 PM  [place] in the past year? 0 0 0 0 0  Number falls in past yr: 0  0 0 0  Injury with Fall? 0  0 0 0  Risk for fall due to : No Fall Risks No Fall Risks No Fall Risks No Fall Risks No Fall Risks  Follow up Falls prevention discussed Falls prevention discussed Falls prevention discussed Falls prevention discussed Falls prevention discussed     Assessment & Plan  1. Snoring  - Ambulatory referral to Sleep Studies  2. Somnolence  - Ambulatory referral to Sleep Studies  3. Essential hypertension  - Ambulatory referral to Sleep Studies  4. Morbid obesity with BMI of 40.0-44.9, adult [facility])  - Ambulatory referral to Sleep Studies   I discussed the assessment and treatment plan with the patient. The patient was provided an opportunity to ask questions and all were answered. The patient agreed with the plan and demonstrated an understanding of the instructions.  The patient was advised to call back or seek an in-person evaluation if the symptoms worsen or if the condition fails to improve as anticipated.  I provided 15 minutes of non-face-to-face time during this encounter.

## 2021-08-12 ENCOUNTER — Encounter: Payer: Self-pay | Admitting: Family Medicine

## 2021-08-12 ENCOUNTER — Telehealth (INDEPENDENT_AMBULATORY_CARE_PROVIDER_SITE_OTHER): Payer: 59 | Admitting: Family Medicine

## 2021-08-12 DIAGNOSIS — R400 Somnolence: Secondary | ICD-10-CM | POA: Diagnosis not present

## 2021-08-12 DIAGNOSIS — R0683 Snoring: Secondary | ICD-10-CM

## 2021-08-12 DIAGNOSIS — Z6841 Body Mass Index (BMI) 40.0 and over, adult: Secondary | ICD-10-CM

## 2021-08-12 DIAGNOSIS — I10 Essential (primary) hypertension: Secondary | ICD-10-CM | POA: Diagnosis not present

## 2021-08-18 ENCOUNTER — Other Ambulatory Visit: Payer: Self-pay | Admitting: Family Medicine

## 2021-08-18 DIAGNOSIS — Z1231 Encounter for screening mammogram for malignant neoplasm of breast: Secondary | ICD-10-CM

## 2021-09-09 ENCOUNTER — Telehealth: Payer: Self-pay | Admitting: Family Medicine

## 2021-09-09 NOTE — Telephone Encounter (Signed)
Copied from [place]. Topic: General - Inquiry >> Sep 09, 2021 11:19 AM Erskine Squibb wrote: Reason for CRM: Patient called in today stating she had a sleep study done last Monday August 14th. It was through [facility] and they mailed her a sleep study kit. She has not heard anything back about the study and wanted to know if the provider received any information. Please assist patient further.

## 2021-09-09 NOTE — Telephone Encounter (Signed)
Sent request for sleep study results.

## 2021-09-15 ENCOUNTER — Ambulatory Visit: Payer: 59

## 2021-09-23 NOTE — Progress Notes (Unsigned)
Name: Michelle Ware   MRN: [id number]    DOB: 06/17/1980   Date:09/24/2021       Progress Note  Subjective  Chief Complaint  Annual Exam  HPI  Patient presents for annual CPE.  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, machine operator   Last Eye Exam: not in a while  Last Dental Exam: every 6 months   Flowsheet Row Video Visit from 11/25/2020 in [facility]  AUDIT-C Score 0      Depression: Phq 9 is  positive    09/24/2021    8:11 AM 08/12/2021   12:12 PM 07/03/2021    3:23 PM 03/26/2021    2:45 PM 02/27/2021    8:15 AM  Depression screen PHQ 2/9  Decreased Interest 0 0 0 1 3  Down, Depressed, Hopeless 1 0 0 1 3  PHQ - 2 Score 1 0 0 2 6  Altered sleeping 0  0 0 1  Tired, decreased energy 0  2 0 2  Change in appetite 3  1 0 3  Feeling bad or failure about yourself  1  0 1 1  Trouble concentrating 0  0 0 3  Moving slowly or fidgety/restless 0  0 0 0  Suicidal thoughts 0  0 0 0  PHQ-9 Score _0 Difficult doing work/chores   Not difficult at all     Hypertension: BP Readings from Last 3 Encounters:  09/24/21 128/72  07/03/21 128/74  03/26/21 132/74   Obesity: Wt Readings from Last 3 Encounters:  09/24/21 (!) 335 lb (152 kg)  07/03/21 (!) 320 lb 8 oz (145.4 kg)  03/26/21 297 lb (134.7 kg)   BMI Readings from Last 3 Encounters:  09/24/21 48.07 kg/m  07/03/21 45.99 kg/m  03/26/21 42.62 kg/m     Vaccines:   HPV: N/A Tdap: up to date Shingrix: N/A Pneumonia: N/A Flu: due COVID-19: up to date   Hep C Screening: 12/27/17 STD testing and prevention (HIV/chl/gon/syphilis): 09/28/16 Intimate partner violence: negative screen  Sexual History : not in years  Menstrual History/LMP/Abnormal Bleeding: cycles, usually lasts 5-6 and most of the time is heavy, past couple of times not so heavy - she will use condoms if she decides to have intercourse  Discussed importance of follow up if any  post-menopausal bleeding: not applicable  Incontinence Symptoms: negative for symptoms   Breast cancer:  - Last Mammogram: Scheduled for 10/01/21 - BRCA gene screening: N/A maternal grandmother had breast cancer, maternal aunt had colon cancer  Osteoporosis Prevention : Discussed high calcium and vitamin D supplementation, weight bearing exercises Bone density: N/A  Cervical cancer screening: 09/19/20  Skin cancer: Discussed monitoring for atypical lesions  Colorectal cancer: N/A   Lung cancer:  Low Dose CT Chest recommended if Age 43-80 years, 46 pack-year currently smoking OR have quit w/in 15years. Patient does not qualify for screen   ECG: 10/31/20  Advanced Care Planning: A voluntary discussion about advance care planning including the explanation and discussion of advance directives.  Discussed health care proxy and Living will, and the patient was able to identify a health care proxy as parents .  Patient does not have a living will and power of attorney of health care   Lipids: Lab Results  Component Value Date   CHOL 136 09/19/2020   CHOL 131 12/22/2019   CHOL 144 12/30/2018   Lab Results  Component Value  Date   HDL 51 09/19/2020   HDL 56 12/22/2019   HDL 52 12/30/2018   Lab Results  Component Value Date   LDLCALC 71 09/19/2020   LDLCALC 60 12/22/2019   LDLCALC 78 12/30/2018   Lab Results  Component Value Date   TRIG 60 09/19/2020   TRIG 70 12/22/2019   TRIG 56 12/30/2018   Lab Results  Component Value Date   CHOLHDL 2.7 09/19/2020   CHOLHDL 2.3 12/22/2019   CHOLHDL 2.8 12/30/2018   No results found for: "LDLDIRECT"  Glucose: Glucose, Bld  Date Value Ref Range Status  09/19/2020 95 65 - 99 mg/dL Final    Comment:    .            Fasting reference interval .   07/08/2020 98 65 - 99 mg/dL Final    Comment:    .            Fasting reference interval .   12/22/2019 132 (H) 65 - 99 mg/dL Final    Comment:    .            Fasting reference  interval . For someone without known diabetes, a glucose value >125 mg/dL indicates that they may have diabetes and this should be confirmed with a follow-up test. .     Patient Active Problem List   Diagnosis Date Noted   Gastroesophageal reflux disease without esophagitis 07/03/2021   Anxiety 07/03/2021   GAD (generalized anxiety disorder) 07/03/2021   Dysthymia 07/03/2021   History of thyroidectomy 08/05/2017   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 of myomectomy and CHTN  09/28/2016   Tenosynovitis of wrist 05/21/2016   Morbid obesity with BMI of 40.0-44.9, adult ([place]) 03/31/2016   H/O myomectomy 03/11/2015   Essential hypertension 11/28/2014   Hypertrichosis 07/24/2014   Vitamin D deficiency 07/24/2014   PCOS (polycystic ovarian syndrome) 07/24/2014   History of abnormal cervical Pap smear 11/24/2011   Genital herpes 11/29/2008    Past Surgical History:  Procedure Laterality Date   BIOPSY THYROID  07/2015   Dr. Gabriel Carina   CESAREAN SECTION N/A 09/28/2016   Procedure: Primary CESAREAN SECTION;  Surgeon: Servando Salina, MD;  Location: [place];  Service: Obstetrics;  Laterality: N/A;  EDD: 10/19/16 Allergy: Hydrocodone-Acetaminophen   LAPAROSCOPIC GELPORT ASSISTED MYOMECTOMY N/A 03/11/2015   Procedure: LAPAROSCOPIC  MYOMECTOMY--attempted;  Surgeon: Rubie Maid, MD;  Location: [facility];  Service: Gynecology;  Laterality: N/A;   LAPAROTOMY N/A 03/11/2015   Procedure: LAPAROTOMY--MYOMECTOMY;  Surgeon: Rubie Maid, MD;  Location: [facility];  Service: Gynecology;  Laterality: N/A;   MOUTH SURGERY  1996   TOTAL THYROIDECTOMY Bilateral 03/05/2017   Dr. Maudie Mercury [facility]    Family History  Problem Relation Age of Onset   Thyroid disease Mother    Hypertension Mother    Cancer Maternal Aunt    Cancer Maternal Grandfather    Prostate cancer Maternal Grandfather    Stroke Maternal Grandmother    Breast cancer Paternal Grandmother     Heart failure Paternal Grandfather    Liver disease Brother        Fatty Liver    Social History   Socioeconomic History   Marital status: Single    Spouse name: Not on file   Number of children: 1   Years of education: Not on file   Highest education level: Bachelor's degree (e.g., BA, AB, BS)  Occupational History   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   Social Determinants of Health   Financial Resource Strain: Low Risk  (09/24/2021)   Overall Financial Resource Strain (CARDIA)    Difficulty of Paying Living Expenses: Not hard at all  Food Insecurity: No Food Insecurity (09/24/2021)   Hunger Vital Sign    Worried About Running Out of Food in the Last Year: Never true    Ran Out of Food in the Last Year: Never true  Transportation Needs: No Transportation Needs (09/24/2021)   PRAPARE - Hydrologist (Medical): No    Lack of Transportation (Non-Medical): No  Physical Activity: Inactive (09/24/2021)   Exercise Vital Sign    Days of Exercise per Week: 0 days    Minutes of Exercise per Session: 0 min  Stress: No Stress Concern Present (09/24/2021)   [place]    Feeling of Stress : Only a little  Social Connections: Moderately Isolated (09/24/2021)   Social Connection and Isolation Panel [NHANES]     Frequency of Communication with Friends and Family: More than three times a week    Frequency of Social Gatherings with Friends and Family: Once a week    Attends Religious Services: More than 4 times per year    Active Member of [employer] or Organizations: No    Attends Archivist Meetings: Never    Marital Status: Never married  Intimate Partner Violence: Not At Risk (09/24/2021)   Humiliation, Afraid, Rape, and Kick questionnaire    Fear of Current or Ex-Partner: No    Emotionally Abused: No    Physically Abused: No    Sexually Abused: No     Current Outpatient Medications:    escitalopram (LEXAPRO) 10 MG tablet, Take 1 tablet (10 mg total) by mouth daily., Disp: 90 tablet, Rfl: 0   levothyroxine (SYNTHROID) 200 MCG tablet, TAKE 1 TABLET (200 MCG TOTAL) BY MOUTH DAILY BEFORE BREAKFAST. TAKE AN EXTRA HALF PILL 3 X WEEKLY (MON, WED, FRI FOR TOTAL OF 300 MCG), Disp: 40 tablet, Rfl: 5   norelgestromin-ethinyl estradiol (ORTHO EVRA) 150-35 MCG/24HR transdermal patch, Place 1 patch onto the skin once a week., Disp: 9 patch, Rfl: 3   omeprazole (PRILOSEC) 20 MG capsule, Take 1 capsule (20 mg total) by mouth 2 (two) times daily before a meal. For the first week take twice daily, second week before breast, after that every other day, Disp: 60 capsule, Rfl: 0   triamterene-hydrochlorothiazide (DYAZIDE) 37.5-25 MG capsule, Take 1 each (1 capsule total) by mouth daily., Disp: 90 capsule, Rfl: 1   valACYclovir (VALTREX) 500 MG tablet, TAKE 1 TABLET BY MOUTH 2 TIMES DAILY FOR OUTBREAKS AND ONCE DAILY FOR PREVENTION AS DIRECTED, Disp: 60 tablet, Rfl: 3   Vitamin D, Ergocalciferol, (DRISDOL) 1.25 MG (  50000 UNIT) CAPS capsule, Take 1 capsule (50,000 Units total) by mouth every 7 (seven) days., Disp: 12 capsule, Rfl: 1   docusate sodium (COLACE) 100 MG capsule, Take 1 capsule (100 mg total) by mouth daily as needed for mild constipation (for iron supplement side effects). (Patient not taking: Reported on  09/24/2021), Disp: 90 capsule, Rfl: 1   ferrous sulfate 325 (65 FE) MG EC tablet, Take 1 tablet (325 mg total) by mouth 3 (three) times daily with meals. (Patient not taking: Reported on 09/24/2021), Disp: 270 tablet, Rfl: 0  Allergies  Allergen Reactions   Hydrocodone Itching     ROS  Constitutional: Negative for fever , positive for weight change.  Respiratory: Negative for cough and shortness of breath.   Cardiovascular: Negative for chest pain or palpitations.  Gastrointestinal: Negative for abdominal pain, no bowel changes.  Musculoskeletal: Negative for gait problem or joint swelling.  Skin: Negative for rash.  Neurological: Negative for dizziness or headache.  No other specific complaints in a complete review of systems (except as listed in HPI above).   Objective  Vitals:   09/24/21 0812  BP: 128/72  Pulse: 93  Resp: 16  SpO2: 99%  Weight: (!) 335 lb (152 kg)  Height: _0  (1.778 m)    Body mass index is 48.07 kg/m.  Physical Exam  Constitutional: Patient appears well-developed and well-nourished. No distress.  HENT: Head: Normocephalic and atraumatic. Ears: B TMs ok, no erythema or effusion; Nose: Nose normal. Mouth/Throat: Oropharynx is clear and moist. No oropharyngeal exudate.  Eyes: Conjunctivae and EOM are normal. Pupils are equal, round, and reactive to light. No scleral icterus.  Neck: Normal range of motion. Neck supple. No JVD present. No thyromegaly present.  Cardiovascular: Normal rate, regular rhythm and normal heart sounds.  No murmur heard. No BLE edema. Pulmonary/Chest: Effort normal and breath sounds normal. No respiratory distress. Abdominal: Soft. Bowel sounds are normal, no distension. There is no tenderness. no masses Breast: no lumps or masses, no nipple discharge or rashes FEMALE GENITALIA:  Not done  RECTAL: not done  Musculoskeletal: Normal range of motion, no joint effusions. No gross deformities Neurological: he is alert and oriented  to person, place, and time. No cranial nerve deficit. Coordination, balance, strength, speech and gait are normal.  Skin: Skin is warm and dry. No rash noted. No erythema.  Psychiatric: Patient has a normal mood and affect. behavior is normal. Judgment and thought content normal.   Fall Risk:    09/24/2021    8:10 AM 08/12/2021   12:11 PM 07/03/2021    3:22 PM 03/26/2021    2:41 PM 02/27/2021    8:14 AM  Fall Risk   Falls in the past year? 0 0 0 0 0  Number falls in past yr: 0 0  0 0  Injury with Fall? 0 0  0 0  Risk for fall due to : _1   Follow up _2      Functional Status Survey: Is the patient deaf or have difficulty hearing?: No Does the patient have difficulty seeing, even when wearing glasses/contacts?: No Does the patient have difficulty concentrating, remembering, or making decisions?: No Does the patient have difficulty walking or climbing stairs?: No Does the patient have difficulty dressing or bathing?: No Does the patient have difficulty doing errands alone such as  visiting a doctor's office or shopping?: No   Assessment & Plan  1. Well adult exam  - Lipid panel - CBC with Differential/Platelet - COMPLETE METABOLIC PANEL WITH GFR - TSH - VITAMIN D 25 Hydroxy (Vit-D Deficiency, Fractures) - Hemoglobin A1c  2. Vitamin D deficiency  - VITAMIN D 25 Hydroxy (Vit-D Deficiency, Fractures)  3. History of thyroidectomy  - Hemoglobin A1c  4. Essential hypertension  - COMPLETE METABOLIC PANEL WITH GFR  5. Iron deficiency anemia due to chronic blood loss  - CBC with Differential/Platelet  6. Prediabetes   7. Long-term use of high-risk medication   8. Lipid screening  - Lipid panel     -USPSTF grade A and B recommendations reviewed with patient; age-appropriate  recommendations, preventive care, screening tests, etc discussed and encouraged; healthy living encouraged; see AVS for patient education given to patient -Discussed importance of 150 minutes of physical activity weekly, eat two servings of fish weekly, eat one serving of tree nuts ( cashews, pistachios, pecans, almonds.Marland Kitchen) every other day, eat 6 servings of fruit/vegetables daily and drink plenty of water and avoid sweet beverages.   -Reviewed Health Maintenance: Yes.

## 2021-09-23 NOTE — Patient Instructions (Incomplete)
Preventive Care 40-43 Years Old, Female Preventive care refers to lifestyle choices and visits with your health care provider that can promote health and wellness. Preventive care visits are also called wellness exams. What can I expect for my preventive care visit? Counseling Your health care provider may ask you questions about your: Medical history, including: Past medical problems. Family medical history. Pregnancy history. Current health, including: Menstrual cycle. Method of birth control. Emotional well-being. Home life and relationship well-being. Sexual activity and sexual health. Lifestyle, including: Alcohol, nicotine or tobacco, and drug use. Access to firearms. Diet, exercise, and sleep habits. Work and work environment. Sunscreen use. Safety issues such as seatbelt and bike helmet use. Physical exam Your health care provider will check your: Height and weight. These may be used to calculate your BMI (body mass index). BMI is a measurement that tells if you are at a healthy weight. Waist circumference. This measures the distance around your waistline. This measurement also tells if you are at a healthy weight and may help predict your risk of certain diseases, such as type 2 diabetes and high blood pressure. Heart rate and blood pressure. Body temperature. Skin for abnormal spots. What immunizations do I need?  Vaccines are usually given at various ages, according to a schedule. Your health care provider will recommend vaccines for you based on your age, medical history, and lifestyle or other factors, such as travel or where you work. What tests do I need? Screening Your health care provider may recommend screening tests for certain conditions. This may include: Lipid and cholesterol levels. Diabetes screening. This is done by checking your blood sugar (glucose) after you have not eaten for a while (fasting). Pelvic exam and Pap test. Hepatitis B test. Hepatitis C  test. HIV (human immunodeficiency virus) test. STI (sexually transmitted infection) testing, if you are at risk. Lung cancer screening. Colorectal cancer screening. Mammogram. Talk with your health care provider about when you should start having regular mammograms. This may depend on whether you have a family history of breast cancer. BRCA-related cancer screening. This may be done if you have a family history of breast, ovarian, tubal, or peritoneal cancers. Bone density scan. This is done to screen for osteoporosis. Talk with your health care provider about your test results, treatment options, and if necessary, the need for more tests. Follow these instructions at home: Eating and drinking  Eat a diet that includes fresh fruits and vegetables, whole grains, lean protein, and low-fat dairy products. Take vitamin and mineral supplements as recommended by your health care provider. Do not drink alcohol if: Your health care provider tells you not to drink. You are pregnant, may be pregnant, or are planning to become pregnant. If you drink alcohol: Limit how much you have to 0-1 drink a day. Know how much alcohol is in your drink. In the [place] one drink equals one 12 oz bottle of beer (355 mL), one 5 oz glass of wine (148 mL), or one 1 oz glass of hard liquor (44 mL). Lifestyle Brush your teeth every morning and night with fluoride toothpaste. Floss one time each day. Exercise for at least 30 minutes 5 or more days each week. Do not use any products that contain nicotine or tobacco. These products include cigarettes, chewing tobacco, and vaping devices, such as e-cigarettes. If you need help quitting, ask your health care provider. Do not use drugs. If you are sexually active, practice safe sex. Use a condom or other form of protection to   prevent STIs. If you do not wish to become pregnant, use a form of birth control. If you plan to become pregnant, see your health care provider for a  prepregnancy visit. Take aspirin only as told by your health care provider. Make sure that you understand how much to take and what form to take. Work with your health care provider to find out whether it is safe and beneficial for you to take aspirin daily. Find healthy ways to manage stress, such as: Meditation, yoga, or listening to music. Journaling. Talking to a trusted person. Spending time with friends and family. Minimize exposure to UV radiation to reduce your risk of skin cancer. Safety Always wear your seat belt while driving or riding in a vehicle. Do not drive: If you have been drinking alcohol. Do not ride with someone who has been drinking. When you are tired or distracted. While texting. If you have been using any mind-altering substances or drugs. Wear a helmet and other protective equipment during sports activities. If you have firearms in your house, make sure you follow all gun safety procedures. Seek help if you have been physically or sexually abused. What's next? Visit your health care provider once a year for an annual wellness visit. Ask your health care provider how often you should have your eyes and teeth checked. Stay up to date on all vaccines. This information is not intended to replace advice given to you by your health care provider. Make sure you discuss any questions you have with your health care provider. Document Revised: 07/03/2020 Document Reviewed: 07/03/2020 Elsevier Patient Education  [place].

## 2021-09-24 ENCOUNTER — Encounter: Payer: Self-pay | Admitting: Family Medicine

## 2021-09-24 ENCOUNTER — Ambulatory Visit (INDEPENDENT_AMBULATORY_CARE_PROVIDER_SITE_OTHER): Payer: 59 | Admitting: Family Medicine

## 2021-09-24 VITALS — BP 128/72 | HR 93 | Resp 16 | Ht 70.0 in | Wt 335.0 lb

## 2021-09-24 DIAGNOSIS — E890 Postprocedural hypothyroidism: Secondary | ICD-10-CM | POA: Diagnosis not present

## 2021-09-24 DIAGNOSIS — Z0000 Encounter for general adult medical examination without abnormal findings: Secondary | ICD-10-CM | POA: Diagnosis not present

## 2021-09-24 DIAGNOSIS — I10 Essential (primary) hypertension: Secondary | ICD-10-CM

## 2021-09-24 DIAGNOSIS — E559 Vitamin D deficiency, unspecified: Secondary | ICD-10-CM | POA: Diagnosis not present

## 2021-09-24 DIAGNOSIS — D500 Iron deficiency anemia secondary to blood loss (chronic): Secondary | ICD-10-CM

## 2021-09-24 DIAGNOSIS — Z13220 Encounter for screening for lipoid disorders: Secondary | ICD-10-CM

## 2021-09-24 DIAGNOSIS — Z79899 Other long term (current) drug therapy: Secondary | ICD-10-CM

## 2021-09-24 DIAGNOSIS — R7303 Prediabetes: Secondary | ICD-10-CM

## 2021-09-24 DIAGNOSIS — F329 Major depressive disorder, single episode, unspecified: Secondary | ICD-10-CM

## 2021-09-29 ENCOUNTER — Other Ambulatory Visit: Payer: Self-pay

## 2021-09-29 ENCOUNTER — Other Ambulatory Visit: Payer: Self-pay | Admitting: Family Medicine

## 2021-09-29 DIAGNOSIS — D509 Iron deficiency anemia, unspecified: Secondary | ICD-10-CM

## 2021-09-29 DIAGNOSIS — D500 Iron deficiency anemia secondary to blood loss (chronic): Secondary | ICD-10-CM

## 2021-09-29 LAB — COMPLETE METABOLIC PANEL WITHOUT GFR
AG Ratio: 1.4 (calc) (ref 1.0–2.5)
ALT: 15 U/L (ref 6–29)
AST: 23 U/L (ref 10–30)
Albumin: 3.9 g/dL (ref 3.6–5.1)
Alkaline phosphatase (APISO): 37 U/L (ref 31–125)
BUN: 12 mg/dL (ref 7–25)
CO2: 28 mmol/L (ref 20–32)
Calcium: 8.8 mg/dL (ref 8.6–10.2)
Chloride: 106 mmol/L (ref 98–110)
Creat: 0.97 mg/dL (ref 0.50–0.99)
Globulin: 2.8 g/dL (ref 1.9–3.7)
Glucose, Bld: 97 mg/dL (ref 65–99)
Potassium: 4.1 mmol/L (ref 3.5–5.3)
Sodium: 141 mmol/L (ref 135–146)
Total Bilirubin: 0.3 mg/dL (ref 0.2–1.2)
Total Protein: 6.7 g/dL (ref 6.1–8.1)
eGFR: 74 mL/min/{1.73_m2}

## 2021-09-29 LAB — LIPID PANEL
Cholesterol: 137 mg/dL (ref <200)
HDL: 48 mg/dL — ABNORMAL LOW (ref >=50)
LDL Cholesterol (Calc): 74 mg/dL (calc)
Non-HDL Cholesterol (Calc): 89 mg/dL (calc) (ref <130)
Total CHOL/HDL Ratio: 2.9 (calc) (ref <5.0)
Triglycerides: 66 mg/dL (ref <150)

## 2021-09-29 LAB — CBC WITH DIFFERENTIAL/PLATELET
Absolute Monocytes: 537 {cells}/uL (ref 200–950)
Basophils Absolute: 41 {cells}/uL (ref 0–200)
Basophils Relative: 0.6 %
Eosinophils Absolute: 109 {cells}/uL (ref 15–500)
Eosinophils Relative: 1.6 %
HCT: 33.8 % — ABNORMAL LOW (ref 35.0–45.0)
Hemoglobin: 10.7 g/dL — ABNORMAL LOW (ref 11.7–15.5)
Lymphs Abs: 2496 {cells}/uL (ref 850–3900)
MCH: 23.9 pg — ABNORMAL LOW (ref 27.0–33.0)
MCHC: 31.7 g/dL — ABNORMAL LOW (ref 32.0–36.0)
MCV: 75.4 fL — ABNORMAL LOW (ref 80.0–100.0)
MPV: 9.8 fL (ref 7.5–12.5)
Monocytes Relative: 7.9 %
Neutro Abs: 3618 {cells}/uL (ref 1500–7800)
Neutrophils Relative %: 53.2 %
Platelets: 329 10*3/uL (ref 140–400)
RBC: 4.48 Million/uL (ref 3.80–5.10)
RDW: 15.1 % — ABNORMAL HIGH (ref 11.0–15.0)
Total Lymphocyte: 36.7 %
WBC: 6.8 10*3/uL (ref 3.8–10.8)

## 2021-09-29 LAB — TEST AUTHORIZATION

## 2021-09-29 LAB — IRON,TIBC AND FERRITIN PANEL
%SAT: 18 % (calc) (ref 16–45)
Ferritin: 13 ng/mL — ABNORMAL LOW (ref 16–232)
Iron: 72 ug/dL (ref 40–190)
TIBC: 404 mcg/dL (calc) (ref 250–450)

## 2021-09-29 LAB — HEMOGLOBIN A1C
Hgb A1c MFr Bld: 6 % of total Hgb — ABNORMAL HIGH (ref <5.7)
Mean Plasma Glucose: 126 mg/dL
eAG (mmol/L): 7 mmol/L

## 2021-09-29 LAB — TSH: TSH: 4.74 m[IU]/L — ABNORMAL HIGH

## 2021-09-29 LAB — VITAMIN D 25 HYDROXY (VIT D DEFICIENCY, FRACTURES): Vit D, 25-Hydroxy: 29 ng/mL — ABNORMAL LOW (ref 30–100)

## 2021-09-29 NOTE — Progress Notes (Signed)
Iron studies added

## 2021-09-30 ENCOUNTER — Other Ambulatory Visit: Payer: Self-pay | Admitting: Family Medicine

## 2021-09-30 DIAGNOSIS — F411 Generalized anxiety disorder: Secondary | ICD-10-CM

## 2021-09-30 DIAGNOSIS — F341 Dysthymic disorder: Secondary | ICD-10-CM

## 2021-10-01 ENCOUNTER — Ambulatory Visit
Admission: RE | Admit: 2021-10-01 | Discharge: 2021-10-01 | Disposition: A | Discharge status: Home / Self Care | Payer: 59 | Source: Ambulatory Visit | Attending: Family Medicine | Admitting: Family Medicine

## 2021-10-01 DIAGNOSIS — Z1231 Encounter for screening mammogram for malignant neoplasm of breast: Secondary | ICD-10-CM | POA: Insufficient documentation

## 2021-10-02 NOTE — Progress Notes (Unsigned)
Name: Michelle Ware   MRN: [id number]    DOB: 08/09/1980   Date:10/03/2021       Progress Note  Subjective  Chief Complaint  Follow Up  I connected with  Michelle Ware  on 10/03/21 at  3:40 PM EDT by a video enabled telemedicine application and verified that I am speaking with the correct person using two identifiers.  I discussed the limitations of evaluation and management by telemedicine and the availability of in person appointments. The patient expressed understanding and agreed to proceed with the virtual visit  Staff also discussed with the patient that there may be a patient responsible charge related to this service. Patient Location: in her car  Provider Location: [facility] Additional Individuals present: alone   HPI  GAD: she saw me 01/31/2021 because of increase in anxiety, she is still seeing therapist  Farrel Gordon . She has been taking lexapro and is doing much better . Unchanged   Dysthymia: lexapro seems to be working, no longer having crying spells, sadness has resolved. Feeling better. She is no longer feeling as frightened about letting her son stay with family and friends again. No longer worried as much about the bed bugs . Feeling much better   HTN: bp is at goal today,  she denies chest pain, dizziness, positive for intermittent palpitation but negative holter - she states episodes are very seldom now    Vitamin D deficiency: taking supplementation and level improved   Anemia:, she states cycles were not as heavy but since delivery back in 09/2016 cycles are heavy, lasts 4-5 days She has ortho evra patch at the pharmacy but is worried about side effects. Discussed possible risk and she will think about it since anemia is worse    Morbid Obesity: insurance denied medication for weight loss, and they don't cover bariatric surgery. She had lost a lot of weight when depressed, but now that mood has improved her weight is going up, baseline weight was 331 lbs , dropped to  below 300 lbs and is now 320 lbs. She is trying to eat smaller portions and being more mindful about her diet, she has not started her physical activity since she has been feeling more tired lately.   GERD: she states she changed her diet , packing her lunch and not having indigestion. Controlled with life style modifications, very seldom takes PPI.    Post-surgical hypothyroidism: she states she has been taking medication every morning, Last TSH  was above goal, she will add one extra half pill once a week.  Denies dysphagia, or change in bowel movement. She will come by to recheck it in 6 weeks   Low HDL: discussed more fish and tree nuts in her diet and increase physical activity   She had a home OSA due to snoring and daytime somnolence. The company is called [employer] and we will contact them for [phone] for the results   Patient Active Problem List   Diagnosis Date Noted   Gastroesophageal reflux disease without esophagitis 07/03/2021   Anxiety 07/03/2021   GAD (generalized anxiety disorder) 07/03/2021   Dysthymia 07/03/2021   History of thyroidectomy 08/05/2017   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 of myomectomy and CHTN  09/28/2016   Tenosynovitis of wrist 05/21/2016   Morbid obesity with BMI of 40.0-44.9, adult ([place]) 03/31/2016   H/O myomectomy 03/11/2015   Essential hypertension 11/28/2014   Hypertrichosis 07/24/2014   Vitamin D deficiency 07/24/2014  PCOS (polycystic ovarian syndrome) 07/24/2014   History of abnormal cervical Pap smear 11/24/2011   Genital herpes 11/29/2008    Past Surgical History:  Procedure Laterality Date   BIOPSY THYROID  07/2015   Dr. Gabriel Carina   CESAREAN SECTION N/A 09/28/2016   Procedure: Primary CESAREAN SECTION;  Surgeon: Servando Salina, MD;  Location: [facility];  Service: Obstetrics;  Laterality: N/A;  EDD: 10/19/16 Allergy: Hydrocodone-Acetaminophen   LAPAROSCOPIC GELPORT  ASSISTED MYOMECTOMY N/A 03/11/2015   Procedure: LAPAROSCOPIC  MYOMECTOMY--attempted;  Surgeon: Rubie Maid, MD;  Location: [facility];  Service: Gynecology;  Laterality: N/A;   LAPAROTOMY N/A 03/11/2015   Procedure: LAPAROTOMY--MYOMECTOMY;  Surgeon: Rubie Maid, MD;  Location: [facility];  Service: Gynecology;  Laterality: N/A;   MOUTH SURGERY  1996   TOTAL THYROIDECTOMY Bilateral 03/05/2017   Dr. Maudie Mercury [facility]    Family History  Problem Relation Age of Onset   Thyroid disease Mother    Hypertension Mother    Cancer Maternal Aunt    Cancer Maternal Grandfather    Prostate cancer Maternal Grandfather    Stroke Maternal Grandmother    Breast cancer Paternal Grandmother    Heart failure Paternal Grandfather    Liver disease Brother        Fatty Liver    Social History   Socioeconomic History   Marital status: Single    Spouse name: Not on file   Number of children: 1   Years of education: Not on file   Highest education level: Bachelor's degree (e.g., BA, AB, BS)  Occupational History   Occupation: Glass blower/designer    Comment: works for [employer] in [employer]  Tobacco Use   Smoking status: Never   Smokeless tobacco: Never  Vaping Use   Vaping Use: Never used  Substance and Sexual Activity   Alcohol use: Yes    Comment: occasionally, about once a month   Drug use: No   Sexual activity: Not Currently    Partners: Male    Birth control/protection: Condom  Other Topics Concern   Not on file  Social History Narrative   Lives by herself, broke up with Carloyn Manner, but they had a son 09/28/2016 and he is involved in their son's life   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   Social Determinants of Health   Financial Resource Strain: Low Risk  (09/24/2021)   Overall Financial Resource Strain (CARDIA)    Difficulty of Paying Living Expenses: Not hard at all  Food Insecurity: No Food Insecurity (09/24/2021)   Hunger Vital Sign    Worried About  Running Out of Food in the Last Year: Never true    Ran Out of Food in the Last Year: Never true  Transportation Needs: No Transportation Needs (09/24/2021)   PRAPARE - Hydrologist (Medical): No    Lack of Transportation (Non-Medical): No  Physical Activity: Inactive (09/24/2021)   Exercise Vital Sign    Days of Exercise per Week: 0 days    Minutes of Exercise per Session: 0 min  Stress: No Stress Concern Present (09/24/2021)   [place]    Feeling of Stress : Only a little  Social Connections: Moderately Isolated (09/24/2021)   Social Connection and Isolation Panel [NHANES]    Frequency of Communication with Friends and Family: More than three times a week    Frequency of Social Gatherings with Friends and Family:  Once a week    Attends Religious Services: More than 4 times per year    Active Member of Clubs or Organizations: No    Attends Archivist Meetings: Never    Marital Status: Never married  Intimate Partner Violence: Not At Risk (09/24/2021)   Humiliation, Afraid, Rape, and Kick questionnaire    Fear of Current or Ex-Partner: No    Emotionally Abused: No    Physically Abused: No    Sexually Abused: No     Current Outpatient Medications:    escitalopram (LEXAPRO) 10 MG tablet, TAKE 1 TABLET BY MOUTH EVERY DAY, Disp: 30 tablet, Rfl: 0   levothyroxine (SYNTHROID) 200 MCG tablet, TAKE 1 TABLET (200 MCG TOTAL) BY MOUTH DAILY BEFORE BREAKFAST. TAKE AN EXTRA HALF PILL 3 X WEEKLY (MON, WED, FRI FOR TOTAL OF 300 MCG), Disp: 40 tablet, Rfl: 5   norelgestromin-ethinyl estradiol (ORTHO EVRA) 150-35 MCG/24HR transdermal patch, Place 1 patch onto the skin once a week., Disp: 9 patch, Rfl: 3   omeprazole (PRILOSEC) 20 MG capsule, Take 1 capsule (20 mg total) by mouth 2 (two) times daily before a meal. For the first week take twice daily, second week before breast, after that every other day,  Disp: 60 capsule, Rfl: 0   triamterene-hydrochlorothiazide (DYAZIDE) 37.5-25 MG capsule, Take 1 each (1 capsule total) by mouth daily., Disp: 90 capsule, Rfl: 1   valACYclovir (VALTREX) 500 MG tablet, TAKE 1 TABLET BY MOUTH 2 TIMES DAILY FOR OUTBREAKS AND ONCE DAILY FOR PREVENTION AS DIRECTED, Disp: 60 tablet, Rfl: 3   Vitamin D, Ergocalciferol, (DRISDOL) 1.25 MG (50000 UNIT) CAPS capsule, Take 1 capsule (50,000 Units total) by mouth every 7 (seven) days., Disp: 12 capsule, Rfl: 1  Allergies  Allergen Reactions   Hydrocodone Itching    I personally reviewed active problem list, medication list, allergies, family history, social history, health maintenance with the patient/caregiver today.   ROS  Ten systems reviewed and is negative except as mentioned in HPI  Objective  Virtual encounter, vitals not obtained.  There is no height or weight on file to calculate BMI.  Physical Exam  Awake, alert and oriented   PHQ2/9:    10/03/2021    1:47 PM 10/03/2021   10:40 AM 09/24/2021    8:11 AM 08/12/2021   12:12 PM 07/03/2021    3:23 PM  Depression screen PHQ 2/9  Decreased Interest 0 0 0 0 0  Down, Depressed, Hopeless 0 1 1 0 0  PHQ - 2 Score 0 1 1 0 0  Altered sleeping 0 0 0  0  Tired, decreased energy 0 0 0  2  Change in appetite 0 '3 3  1  '$ Feeling bad or failure about yourself  0 1 1  0  Trouble concentrating 0 0 0  0  Moving slowly or fidgety/restless 0 0 0  0  Suicidal thoughts 0 0 0  0  PHQ-9 Score 0 '5 5  3  '$ Difficult doing work/chores     Not difficult at all   PHQ-2/9 Result is negative.    Fall Risk:    10/03/2021    1:47 PM 10/03/2021   10:40 AM 09/24/2021    8:10 AM 08/12/2021   12:11 PM 07/03/2021    3:22 PM  Fall Risk   Falls in the past year? 0 0 0 0 0  Number falls in past yr: 0  0 0   Injury with Fall? 0  0 0  Risk for fall due to : No Fall Risks No Fall Risks No Fall Risks No Fall Risks No Fall Risks  Follow up Falls prevention discussed Falls prevention  discussed;Education provided Falls prevention discussed Falls prevention discussed Falls prevention discussed     Assessment & Plan  1. Post-surgical hypothyroidism  - levothyroxine (SYNTHROID) 200 MCG tablet; Take 1 tablet (200 mcg total) by mouth daily before breakfast. Take an extra half pill once a week  Dispense: 32 tablet; Refill: 1  2. History of thyroidectomy  - levothyroxine (SYNTHROID) 200 MCG tablet; Take 1 tablet (200 mcg total) by mouth daily before breakfast. Take an extra half pill once a week  Dispense: 32 tablet; Refill: 1  3. Genital herpes simplex, unspecified site   4. Vitamin D deficiency  - Vitamin D, Ergocalciferol, (DRISDOL) 1.25 MG (50000 UNIT) CAPS capsule; Take 1 capsule (50,000 Units total) by mouth every 7 (seven) days.  Dispense: 12 capsule; Refill: 1  5. GAD (generalized anxiety disorder)  - escitalopram (LEXAPRO) 10 MG tablet; Take 1 tablet (10 mg total) by mouth daily.  Dispense: 30 tablet; Refill: 2  6. Dysthymia  - escitalopram (LEXAPRO) 10 MG tablet; Take 1 tablet (10 mg total) by mouth daily.  Dispense: 30 tablet; Refill: 2  I discussed the assessment and treatment plan with the patient. The patient was provided an opportunity to ask questions and all were answered. The patient agreed with the plan and demonstrated an understanding of the instructions.  The patient was advised to call back or seek an in-person evaluation if the symptoms worsen or if the condition fails to improve as anticipated.  I provided 25  minutes of non-face-to-face time during this encounter.

## 2021-10-03 ENCOUNTER — Ambulatory Visit: Payer: 59 | Admitting: Family Medicine

## 2021-10-03 ENCOUNTER — Telehealth (INDEPENDENT_AMBULATORY_CARE_PROVIDER_SITE_OTHER): Payer: 59 | Admitting: Family Medicine

## 2021-10-03 ENCOUNTER — Encounter: Payer: Self-pay | Admitting: Family Medicine

## 2021-10-03 DIAGNOSIS — E559 Vitamin D deficiency, unspecified: Secondary | ICD-10-CM | POA: Diagnosis not present

## 2021-10-03 DIAGNOSIS — A6000 Herpesviral infection of urogenital system, unspecified: Secondary | ICD-10-CM

## 2021-10-03 DIAGNOSIS — F411 Generalized anxiety disorder: Secondary | ICD-10-CM

## 2021-10-03 DIAGNOSIS — E890 Postprocedural hypothyroidism: Secondary | ICD-10-CM | POA: Diagnosis not present

## 2021-10-03 DIAGNOSIS — F341 Dysthymic disorder: Secondary | ICD-10-CM

## 2021-10-03 MED ORDER — VITAMIN D (ERGOCALCIFEROL) 1.25 MG (50000 UNIT) PO CAPS: 50000.0000 [IU] | ORAL_CAPSULE | ORAL | 1 refills | Status: DC

## 2021-10-03 MED ORDER — ESCITALOPRAM OXALATE 10 MG PO TABS: 10.0000 mg | ORAL_TABLET | Freq: Every day | ORAL | 2 refills | Status: DC

## 2021-10-03 MED ORDER — LEVOTHYROXINE SODIUM 200 MCG PO TABS: 200.0000 ug | ORAL_TABLET | Freq: Every day | ORAL | 1 refills | Status: DC

## 2021-10-20 NOTE — Telephone Encounter (Signed)
Pt called back in to follow up on receiving her sleep study results. Pt says that she had her sleep study completed back in August and still hasn't received results.    Pt would like further assistance.

## 2021-10-21 NOTE — Telephone Encounter (Signed)
Results requested, not in Epic.

## 2021-10-23 ENCOUNTER — Telehealth: Payer: Self-pay

## 2021-10-23 DIAGNOSIS — G4733 Obstructive sleep apnea (adult) (pediatric): Secondary | ICD-10-CM

## 2021-10-23 DIAGNOSIS — R0683 Snoring: Secondary | ICD-10-CM

## 2021-10-23 NOTE — Telephone Encounter (Signed)
She states she had a home OSA due to snoring and daytime somnolence. She claims it was through [facility]. We have sent a request for the results twice to them and each time they have said she is not a patient and have never done a sleep study on her. We sent a fax to [facility] twice as well just incase she mistook the name of the company. They also stated they have never seen this patient.  I can see where a report from [facility] was scanned in on 09/01/21.

## 2021-10-24 NOTE — Telephone Encounter (Signed)
CPAP order sent to [facility]

## 2021-10-24 NOTE — Addendum Note (Signed)
Addended by: Docia Furl on: 10/24/2021 01:53 PM   Modules accepted: Orders

## 2021-11-20 ENCOUNTER — Other Ambulatory Visit: Payer: Self-pay | Admitting: Family Medicine

## 2021-11-20 DIAGNOSIS — E890 Postprocedural hypothyroidism: Secondary | ICD-10-CM

## 2021-11-26 ENCOUNTER — Other Ambulatory Visit: Payer: Self-pay | Admitting: Family Medicine

## 2021-11-26 DIAGNOSIS — F411 Generalized anxiety disorder: Secondary | ICD-10-CM

## 2021-11-26 DIAGNOSIS — F341 Dysthymic disorder: Secondary | ICD-10-CM

## 2021-11-26 NOTE — Telephone Encounter (Signed)
Requested medication (s) are due for refill today: yes  Requested medication (s) are on the active medication list: yes  Last refill:  10/03/21  Future visit scheduled: yes  Notes to clinic:  [place]. DX Code Needed.      Requested Prescriptions  Pending Prescriptions Disp Refills   escitalopram (LEXAPRO) 10 MG tablet [Pharmacy Med Name: ESCITALOPRAM 10 MG TABLET] 90 tablet 1    Sig: TAKE 1 TABLET BY MOUTH EVERY DAY     Psychiatry:  Antidepressants - SSRI Passed - 11/26/2021 11:30 AM      Passed - Completed PHQ-2 or PHQ-9 in the last 360 days      Passed - Valid encounter within last 6 months    Recent Outpatient Visits           1 month ago Post-surgical hypothyroidism   [facility] [name], MD   2 months ago Well adult exam   [facility] [name], MD   3 months ago Snoring   [facility] [name], MD   4 months ago Post-surgical hypothyroidism   [facility] [name], MD   8 months ago History of thyroidectomy   [facility] [name], MD       Future Appointments             In 2 months Ancil Boozer, Drue Stager, MD [facility], [facility]

## 2022-01-02 NOTE — Progress Notes (Unsigned)
Name: Michelle Ware   MRN: [id number]    DOB: 06/07/1980   Date:01/05/2022       Progress Note  Subjective  Chief Complaint  Congestion  HPI  Post-nasal drainage: son was sick around Thanksgiving with cold symptoms. She developed nasal congestion followed by a dry cough, she took some otc medication but lately she has noticed more post-nasal drainage and a productive cough. No fever or chills. Denies any headaches, no nausea or vomiting, appetite is normal. She has been sick for over two weeks now   OSA: she started CPAP about 6 weeks ago, compliant, wearing machine from the time she goes to bed around 10:30 pm until 4:30 am when she wakes up in am. She has noticed she has not been feeling as tired during the day, no longer dozing off at work, denies waking up with a headache, tolerating the machine well  Patient Active Problem List   Diagnosis Date Noted   Gastroesophageal reflux disease without esophagitis 07/03/2021   Anxiety 07/03/2021   GAD (generalized anxiety disorder) 07/03/2021   Dysthymia 07/03/2021   History of thyroidectomy 08/05/2017   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 of myomectomy and CHTN  09/28/2016   Tenosynovitis of wrist 05/21/2016   Morbid obesity with BMI of 40.0-44.9, adult ([place]) 03/31/2016   H/O myomectomy 03/11/2015   Essential hypertension 11/28/2014   Hypertrichosis 07/24/2014   Vitamin D deficiency 07/24/2014   PCOS (polycystic ovarian syndrome) 07/24/2014   History of abnormal cervical Pap smear 11/24/2011   Genital herpes 11/29/2008    Past Surgical History:  Procedure Laterality Date   BIOPSY THYROID  07/2015   Dr. Gabriel Carina   CESAREAN SECTION N/A 09/28/2016   Procedure: Primary CESAREAN SECTION;  Surgeon: Servando Salina, MD;  Location: [place];  Service: Obstetrics;  Laterality: N/A;  EDD: 10/19/16 Allergy: Hydrocodone-Acetaminophen   LAPAROSCOPIC GELPORT ASSISTED MYOMECTOMY N/A 03/11/2015    Procedure: LAPAROSCOPIC  MYOMECTOMY--attempted;  Surgeon: Rubie Maid, MD;  Location: [facility];  Service: Gynecology;  Laterality: N/A;   LAPAROTOMY N/A 03/11/2015   Procedure: LAPAROTOMY--MYOMECTOMY;  Surgeon: Rubie Maid, MD;  Location: [facility];  Service: Gynecology;  Laterality: N/A;   MOUTH SURGERY  1996   TOTAL THYROIDECTOMY Bilateral 03/05/2017   Dr. Maudie Mercury [facility]    Family History  Problem Relation Age of Onset   Thyroid disease Mother    Hypertension Mother    Cancer Maternal Aunt    Cancer Maternal Grandfather    Prostate cancer Maternal Grandfather    Stroke Maternal Grandmother    Breast cancer Paternal Grandmother    Heart failure Paternal Grandfather    Liver disease Brother        Fatty Liver    Social History   Tobacco Use   Smoking status: Never   Smokeless tobacco: Never  Substance Use Topics   Alcohol use: Yes    Comment: occasionally, about once a month     Current Outpatient Medications:    azithromycin (ZITHROMAX) 500 MG tablet, Take 1 tablet (500 mg total) by mouth daily., Disp: 3 tablet, Rfl: 0   escitalopram (LEXAPRO) 10 MG tablet, TAKE 1 TABLET BY MOUTH EVERY DAY, Disp: 90 tablet, Rfl: 0   fluticasone (FLONASE) 50 MCG/ACT nasal spray, Place 2 sprays into both nostrils daily., Disp: 16 g, Rfl: 0   levothyroxine (SYNTHROID) 200 MCG tablet, TAKE 1 TABLET (200 MCG TOTAL) BY MOUTH DAILY BEFORE BREAKFAST. TAKE AN EXTRA HALF PILL ONCE A WEEK, Disp:  192 tablet, Rfl: 0   norelgestromin-ethinyl estradiol (ORTHO EVRA) 150-35 MCG/24HR transdermal patch, Place 1 patch onto the skin once a week., Disp: 9 patch, Rfl: 3   omeprazole (PRILOSEC) 20 MG capsule, Take 1 capsule (20 mg total) by mouth 2 (two) times daily before a meal. For the first week take twice daily, second week before breast, after that every other day, Disp: 60 capsule, Rfl: 0   triamterene-hydrochlorothiazide (DYAZIDE) 37.5-25 MG capsule, Take 1 each (1 capsule total) by mouth daily., Disp: 90  capsule, Rfl: 1   valACYclovir (VALTREX) 500 MG tablet, TAKE 1 TABLET BY MOUTH 2 TIMES DAILY FOR OUTBREAKS AND ONCE DAILY FOR PREVENTION AS DIRECTED, Disp: 60 tablet, Rfl: 3   Vitamin D, Ergocalciferol, (DRISDOL) 1.25 MG (50000 UNIT) CAPS capsule, Take 1 capsule (50,000 Units total) by mouth every 7 (seven) days., Disp: 12 capsule, Rfl: 1  Allergies  Allergen Reactions   Hydrocodone Itching    I personally reviewed active problem list, medication list, allergies, family history, social history, health maintenance with the patient/caregiver today.   ROS  Ten systems reviewed and is negative except as mentioned in HPI   Objective  Vitals:   01/05/22 1404  BP: 126/74  Pulse: 96  Resp: 16  Temp: 98.3 F (36.8 C)  SpO2: 98%  Height: '5\' 10"'$  (1.778 m)    Body mass index is 48.07 kg/m.  Physical Exam  Constitutional: Patient appears well-developed and well-nourished. Obese  No distress.  HEENT: head atraumatic, normocephalic, pupils equal and reactive to light, ears normal TM, neck supple, unable to see posterior pharynx, no tenderness during palpation of sinus  Cardiovascular: Normal rate, regular rhythm and normal heart sounds.  No murmur heard. No BLE edema. Pulmonary/Chest: Effort normal and breath sounds normal. No respiratory distress. Abdominal: Soft.  There is no tenderness. Psychiatric: Patient has a normal mood and affect. behavior is normal. Judgment and thought content normal.   PHQ2/9:    01/05/2022    2:03 PM 10/03/2021    1:47 PM 10/03/2021   10:40 AM 09/24/2021    8:11 AM 08/12/2021   12:12 PM  Depression screen PHQ 2/9  Decreased Interest 0 0 0 0 0  Down, Depressed, Hopeless 0 0 1 1 0  PHQ - 2 Score 0 0 1 1 0  Altered sleeping 0 0 0 0   Tired, decreased energy 0 0 0 0   Change in appetite 0 0 3 3   Feeling bad or failure about yourself  0 0 1 1   Trouble concentrating 0 0 0 0   Moving slowly or fidgety/restless 0 0 0 0   Suicidal thoughts 0 0 0 0    PHQ-9 Score 0 0 5 5     phq 9 is negative   Fall Risk:    01/05/2022    2:03 PM 10/03/2021    1:47 PM 10/03/2021   10:40 AM 09/24/2021    8:10 AM 08/12/2021   12:11 PM  Fall Risk   Falls in the past year? 0 0 0 0 0  Number falls in past yr: 0 0  0 0  Injury with Fall? 0 0  0 0  Risk for fall due to : No Fall Risks No Fall Risks No Fall Risks No Fall Risks No Fall Risks  Follow up Falls prevention discussed Falls prevention discussed Falls prevention discussed;Education provided Falls prevention discussed Falls prevention discussed      Functional Status Survey: Is the patient deaf  or have difficulty hearing?: No Does the patient have difficulty seeing, even when wearing glasses/contacts?: No Does the patient have difficulty concentrating, remembering, or making decisions?: No Does the patient have difficulty walking or climbing stairs?: No Does the patient have difficulty dressing or bathing?: No Does the patient have difficulty doing errands alone such as visiting a doctor's office or shopping?: No    Assessment & Plan  1. Post-nasal drainage  - fluticasone (FLONASE) 50 MCG/ACT nasal spray; Place 2 sprays into both nostrils daily.  Dispense: 16 g; Refill: 0 - azithromycin (ZITHROMAX) 500 MG tablet; Take 1 tablet (500 mg total) by mouth daily.  Dispense: 3 tablet; Refill: 0  2. OSA (obstructive sleep apnea)  Continue compliance

## 2022-01-05 ENCOUNTER — Ambulatory Visit: Payer: 59 | Admitting: Family Medicine

## 2022-01-05 ENCOUNTER — Encounter: Payer: Self-pay | Admitting: Family Medicine

## 2022-01-05 VITALS — BP 126/74 | HR 96 | Temp 98.3°F | Resp 16 | Ht 70.0 in

## 2022-01-05 DIAGNOSIS — R0982 Postnasal drip: Secondary | ICD-10-CM

## 2022-01-05 DIAGNOSIS — G4733 Obstructive sleep apnea (adult) (pediatric): Secondary | ICD-10-CM | POA: Diagnosis not present

## 2022-01-05 MED ORDER — AZITHROMYCIN 500 MG PO TABS: 500.0000 mg | ORAL_TABLET | Freq: Every day | ORAL | 0 refills | Status: DC

## 2022-01-05 MED ORDER — FLUTICASONE PROPIONATE 50 MCG/ACT NA SUSP: 2.0000 | Freq: Every day | NASAL | 0 refills | Status: AC

## 2022-02-02 NOTE — Progress Notes (Unsigned)
Name: Michelle Ware   MRN: [id number]    DOB: 10-Apr-1980   Date:02/03/2022       Progress Note  Subjective  Chief Complaint  Follow Up  HPI  GAD: she saw me 01/31/2021 because of increase in anxiety, last visit with therapist,  Farrel Gordon was about one month ago  . She has been taking lexapro and is doing much better .  OSA: she started CPAP about 3 months ago , compliant, wearing machine from the time she goes to bed around 10:30 pm until 4:30 am when she wakes up in am. She has noticed she has not been feeling as tired during the day, no longer dozing off at work, denies waking up with a headache, tolerating the machine well It is beneficial for her   Dysthymia: lexapro seems to be working, no longer having crying spells, sadness has resolved. Feeling better. She is no longer feeling as frightened about letting her son stay with family and friends again. No longer worried as much about the bed bugs .Doing well, continue lexapro   HTN: bp is slightly elevated today, but at goal at home,  she denies chest pain, dizziness, no recent episodes of palpitations    Vitamin D deficiency: taking supplementation and level improved , continue supplementation   Anemia:, she states cycles were not as heavy but since delivery back in 09/2016 cycles are heavy, lasts 4-5 days. She states it is so heavy at night that she needs to wear depends. Discussed importance of stopping the heavy bleeding, she is willing to see Dr. Garwin Brothers to discuss ablation, IUD versus other options I also reminded her to have hemoccult stool cards    Morbid Obesity: insurance denied medication for weight loss, and they don't cover bariatric surgery. She had lost a lot of weight when depressed, but now that mood has improved her weight is going up, baseline weight was 331 lbs , dropped to below 300 lbs a but gradually going up and today it is 355.6 lbs .She is doing a walking program through her phone, she is eating small portions  and intermittent fasting - from 7 pm until 11:45 am  GERD: she states she changed her diet , packing her lunch and not having indigestion. Controlled with life style modifications, very seldom takes PPI. Unchanged    Post-surgical hypothyroidism: she states she has been taking medication every morning, Last TSH  was above goal, she is only taking one daily , she did not go up to on extra half, we will recheck labs today. Denies dysphagia, or change in bowel movement.   Low HDL: she states she has been eating more fish , she started an indoor walking program through her phone    Patient Active Problem List   Diagnosis Date Noted   Gastroesophageal reflux disease without esophagitis 07/03/2021   Anxiety 07/03/2021   GAD (generalized anxiety disorder) 07/03/2021   Dysthymia 07/03/2021   History of thyroidectomy 08/05/2017   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 of myomectomy and CHTN  09/28/2016   Tenosynovitis of wrist 05/21/2016   Morbid obesity with BMI of 40.0-44.9, adult ([place]) 03/31/2016   H/O myomectomy 03/11/2015   Essential hypertension 11/28/2014   Hypertrichosis 07/24/2014   Vitamin D deficiency 07/24/2014   PCOS (polycystic ovarian syndrome) 07/24/2014   History of abnormal cervical Pap smear 11/24/2011   Genital herpes 11/29/2008    Past Surgical History:  Procedure Laterality Date   BIOPSY THYROID  07/2015  Dr. Gabriel Carina   CESAREAN SECTION N/A 09/28/2016   Procedure: Primary CESAREAN SECTION;  Surgeon: Servando Salina, MD;  Location: [place];  Service: Obstetrics;  Laterality: N/A;  EDD: 10/19/16 Allergy: Hydrocodone-Acetaminophen   LAPAROSCOPIC GELPORT ASSISTED MYOMECTOMY N/A 03/11/2015   Procedure: LAPAROSCOPIC  MYOMECTOMY--attempted;  Surgeon: Rubie Maid, MD;  Location: [facility];  Service: Gynecology;  Laterality: N/A;   LAPAROTOMY N/A 03/11/2015   Procedure: LAPAROTOMY--MYOMECTOMY;  Surgeon: Rubie Maid, MD;   Location: [facility];  Service: Gynecology;  Laterality: N/A;   MOUTH SURGERY  1996   TOTAL THYROIDECTOMY Bilateral 03/05/2017   Dr. Maudie [facility]    Family History  Problem Relation Age of Onset   Thyroid disease Mother    Hypertension Mother    Cancer Maternal Aunt    Cancer Maternal Grandfather    Prostate cancer Maternal Grandfather    Stroke Maternal Grandmother    Breast cancer Paternal Grandmother    Heart failure Paternal Grandfather    Liver disease Brother        Fatty Liver    Social History   Tobacco Use   Smoking status: Never   Smokeless tobacco: Never  Substance Use Topics   Alcohol use: Yes    Comment: occasionally, about once a month     Current Outpatient Medications:    escitalopram (LEXAPRO) 10 MG tablet, TAKE 1 TABLET BY MOUTH EVERY DAY, Disp: 90 tablet, Rfl: 0   fluticasone (FLONASE) 50 MCG/ACT nasal spray, Place 2 sprays into both nostrils daily., Disp: 16 g, Rfl: 0   levothyroxine (SYNTHROID) 200 MCG tablet, TAKE 1 TABLET (200 MCG TOTAL) BY MOUTH DAILY BEFORE BREAKFAST. TAKE AN EXTRA HALF PILL ONCE A WEEK, Disp: 192 tablet, Rfl: 0   norelgestromin-ethinyl estradiol (ORTHO EVRA) 150-35 MCG/24HR transdermal patch, Place 1 patch onto the skin once a week., Disp: 9 patch, Rfl: 3   triamterene-hydrochlorothiazide (DYAZIDE) 37.5-25 MG capsule, Take 1 each (1 capsule total) by mouth daily., Disp: 90 capsule, Rfl: 1   valACYclovir (VALTREX) 500 MG tablet, TAKE 1 TABLET BY MOUTH 2 TIMES DAILY FOR OUTBREAKS AND ONCE DAILY FOR PREVENTION AS DIRECTED, Disp: 60 tablet, Rfl: 3   Vitamin D, Ergocalciferol, (DRISDOL) 1.25 MG (50000 UNIT) CAPS capsule, Take 1 capsule (50,000 Units total) by mouth every 7 (seven) days., Disp: 12 capsule, Rfl: 1   omeprazole (PRILOSEC) 20 MG capsule, Take 1 capsule (20 mg total) by mouth 2 (two) times daily before a meal. For the first week take twice daily, second week before breast, after that every other day (Patient not taking: Reported on  02/03/2022), Disp: 60 capsule, Rfl: 0  Allergies  Allergen Reactions   Hydrocodone Itching    I personally reviewed active problem list, medication list, allergies, family history, social history, health maintenance with the patient/caregiver today.   ROS  Ten systems reviewed and is negative except as mentioned in HPI   Objective  Vitals:   02/03/22 0755  BP: 138/72  Pulse: 93  Resp: 16  SpO2: 98%  Height: '5\' 10"'$  (1.778 m)    Body mass index is 48.07 kg/m.  Physical Exam  Constitutional: Patient appears well-developed and well-nourished. Obese  No distress.  HEENT: head atraumatic, normocephalic, pupils equal and reactive to light, neck supple Cardiovascular: Normal rate, regular rhythm and normal heart sounds.  No murmur heard. No BLE edema. Pulmonary/Chest: Effort normal and breath sounds normal. No respiratory distress. Abdominal: Soft.  There is no tenderness. Psychiatric: Patient has a normal mood and affect.  behavior is normal. Judgment and thought content normal.   PHQ2/9:    02/03/2022    7:56 AM 01/05/2022    2:03 PM 10/03/2021    1:47 PM 10/03/2021   10:40 AM 09/24/2021    8:11 AM  Depression screen PHQ 2/9  Decreased Interest 0 0 0 0 0  Down, Depressed, Hopeless 0 0 0 1 1  PHQ - 2 Score 0 0 0 1 1  Altered sleeping 0 0 0 0 0  Tired, decreased energy 0 0 0 0 0  Change in appetite 0 0 0 3 3  Feeling bad or failure about yourself  0 0 0 1 1  Trouble concentrating 0 0 0 0 0  Moving slowly or fidgety/restless 0 0 0 0 0  Suicidal thoughts 0 0 0 0 0  PHQ-9 Score 0 0 0 5 5    phq 9 is negative   Fall Risk:    02/03/2022    7:56 AM 01/05/2022    2:03 PM 10/03/2021    1:47 PM 10/03/2021   10:40 AM 09/24/2021    8:10 AM  Fall Risk   Falls in the past year? 1 0 0 0 0  Number falls in past yr: 0 0 0  0  Injury with Fall? 0 0 0  0  Risk for fall due to : No Fall Risks No Fall Risks No Fall Risks No Fall Risks No Fall Risks  Follow up Falls prevention  discussed Falls prevention discussed Falls prevention discussed Falls prevention discussed;Education provided Falls prevention discussed      Functional Status Survey: Is the patient deaf or have difficulty hearing?: No Does the patient have difficulty seeing, even when wearing glasses/contacts?: No Does the patient have difficulty concentrating, remembering, or making decisions?: No Does the patient have difficulty walking or climbing stairs?: No Does the patient have difficulty dressing or bathing?: No Does the patient have difficulty doing errands alone such as visiting a doctor's office or shopping?: No    Assessment & Plan  1. Post-surgical hypothyroidism  - TSH  2. OSA (obstructive sleep apnea)  Continue CPAP  3. Iron deficiency anemia due to chronic blood loss  - CBC with Differential/Platelet - Iron, TIBC and Ferritin Panel - Ambulatory referral to Obstetrics / Gynecology  4. Menorrhagia with regular cycle  - Ambulatory referral to Obstetrics / Gynecology  5. Essential hypertension  - triamterene-hydrochlorothiazide (DYAZIDE) 37.5-25 MG capsule; Take 1 each (1 capsule total) by mouth daily.  Dispense: 90 capsule; Refill: 1  6. Dysthymia  - escitalopram (LEXAPRO) 10 MG tablet; Take 1 tablet (10 mg total) by mouth daily.  Dispense: 90 tablet; Refill: 1  7. GAD (generalized anxiety disorder)  - escitalopram (LEXAPRO) 10 MG tablet; Take 1 tablet (10 mg total) by mouth daily.  Dispense: 90 tablet; Refill: 1

## 2022-02-03 ENCOUNTER — Encounter: Payer: Self-pay | Admitting: Family Medicine

## 2022-02-03 ENCOUNTER — Ambulatory Visit: Payer: 59 | Admitting: Family Medicine

## 2022-02-03 VITALS — BP 130/70 | HR 93 | Resp 16 | Ht 70.0 in | Wt 355.6 lb

## 2022-02-03 DIAGNOSIS — I10 Essential (primary) hypertension: Secondary | ICD-10-CM

## 2022-02-03 DIAGNOSIS — N920 Excessive and frequent menstruation with regular cycle: Secondary | ICD-10-CM

## 2022-02-03 DIAGNOSIS — G4733 Obstructive sleep apnea (adult) (pediatric): Secondary | ICD-10-CM

## 2022-02-03 DIAGNOSIS — E890 Postprocedural hypothyroidism: Secondary | ICD-10-CM

## 2022-02-03 DIAGNOSIS — D500 Iron deficiency anemia secondary to blood loss (chronic): Secondary | ICD-10-CM | POA: Diagnosis not present

## 2022-02-03 DIAGNOSIS — F411 Generalized anxiety disorder: Secondary | ICD-10-CM

## 2022-02-03 DIAGNOSIS — F341 Dysthymic disorder: Secondary | ICD-10-CM

## 2022-02-03 MED ORDER — TRIAMTERENE-HCTZ 37.5-25 MG PO CAPS: 1.0000 | ORAL_CAPSULE | Freq: Every day | ORAL | 1 refills | Status: DC

## 2022-02-03 MED ORDER — ESCITALOPRAM OXALATE 10 MG PO TABS: 10.0000 mg | ORAL_TABLET | Freq: Every day | ORAL | 1 refills | Status: DC

## 2022-02-04 LAB — CBC WITH DIFFERENTIAL/PLATELET
Absolute Monocytes: 524 cells/uL (ref 200–950)
Basophils Absolute: 28 cells/uL (ref 0–200)
Basophils Relative: 0.4 %
Eosinophils Absolute: 110 cells/uL (ref 15–500)
Eosinophils Relative: 1.6 %
HCT: 32.3 % — ABNORMAL LOW (ref 35.0–45.0)
Hemoglobin: 10.2 g/dL — ABNORMAL LOW (ref 11.7–15.5)
Lymphs Abs: 2560 cells/uL (ref 850–3900)
MCH: 24.4 pg — ABNORMAL LOW (ref 27.0–33.0)
MCHC: 31.6 g/dL — ABNORMAL LOW (ref 32.0–36.0)
MCV: 77.3 fL — ABNORMAL LOW (ref 80.0–100.0)
MPV: 10.7 fL (ref 7.5–12.5)
Monocytes Relative: 7.6 %
Neutro Abs: 3678 cells/uL (ref 1500–7800)
Neutrophils Relative %: 53.3 %
Platelets: 284 10*3/uL (ref 140–400)
RBC: 4.18 10*6/uL (ref 3.80–5.10)
RDW: 15.1 % — ABNORMAL HIGH (ref 11.0–15.0)
Total Lymphocyte: 37.1 %
WBC: 6.9 10*3/uL (ref 3.8–10.8)

## 2022-02-04 LAB — TSH: TSH: 5.68 mIU/L — ABNORMAL HIGH

## 2022-02-04 LAB — IRON,TIBC AND FERRITIN PANEL
%SAT: 11 % (calc) — ABNORMAL LOW (ref 16–45)
Ferritin: 6 ng/mL — ABNORMAL LOW (ref 16–232)
Iron: 47 ug/dL (ref 40–190)
TIBC: 412 mcg/dL (calc) (ref 250–450)

## 2022-02-24 ENCOUNTER — Ambulatory Visit: Payer: 59 | Admitting: Family Medicine

## 2022-02-24 ENCOUNTER — Encounter: Payer: Self-pay | Admitting: Family Medicine

## 2022-02-24 VITALS — BP 126/76 | HR 88 | Temp 98.3°F | Resp 18 | Ht 70.0 in

## 2022-02-24 DIAGNOSIS — H00012 Hordeolum externum right lower eyelid: Secondary | ICD-10-CM

## 2022-02-24 NOTE — Progress Notes (Signed)
Name: Michelle Ware   MRN: [id number]    DOB: 1980-09-06   Date:02/24/2022       Progress Note  Subjective  Chief Complaint  Swollen Eye  HPI  Sty: patient states she noticed right eye was sore when she woke up on Sunday. She now notices a bump on lower right lid , it is tender to touch and has some swelling under right eye in the morning. No fever or chills. No problems with her vision  Patient Active Problem List   Diagnosis Date Noted   Gastroesophageal reflux disease without esophagitis 07/03/2021   Anxiety 07/03/2021   GAD (generalized anxiety disorder) 07/03/2021   Dysthymia 07/03/2021   History of thyroidectomy 08/05/2017   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 of myomectomy and CHTN  09/28/2016   Tenosynovitis of wrist 05/21/2016   Morbid obesity with BMI of 40.0-44.9, adult ([place]) 03/31/2016   H/O myomectomy 03/11/2015   Essential hypertension 11/28/2014   Hypertrichosis 07/24/2014   Vitamin D deficiency 07/24/2014   PCOS (polycystic ovarian syndrome) 07/24/2014   History of abnormal cervical Pap smear 11/24/2011   Genital herpes 11/29/2008    Past Surgical History:  Procedure Laterality Date   BIOPSY THYROID  07/2015   Dr. Gabriel Carina   CESAREAN SECTION N/A 09/28/2016   Procedure: Primary CESAREAN SECTION;  Surgeon: Servando Salina, MD;  Location: [place];  Service: Obstetrics;  Laterality: N/A;  EDD: 10/19/16 Allergy: Hydrocodone-Acetaminophen   LAPAROSCOPIC GELPORT ASSISTED MYOMECTOMY N/A 03/11/2015   Procedure: LAPAROSCOPIC  MYOMECTOMY--attempted;  Surgeon: Rubie Maid, MD;  Location: [facility];  Service: Gynecology;  Laterality: N/A;   LAPAROTOMY N/A 03/11/2015   Procedure: LAPAROTOMY--MYOMECTOMY;  Surgeon: Rubie Maid, MD;  Location: [facility];  Service: Gynecology;  Laterality: N/A;   MOUTH SURGERY  1996   TOTAL THYROIDECTOMY Bilateral 03/05/2017   Dr. Maudie Mercury [facility]    Family History  Problem Relation Age of Onset    Thyroid disease Mother    Hypertension Mother    Cancer Maternal Aunt    Cancer Maternal Grandfather    Prostate cancer Maternal Grandfather    Stroke Maternal Grandmother    Breast cancer Paternal Grandmother    Heart failure Paternal Grandfather    Liver disease Brother        Fatty Liver    Social History   Tobacco Use   Smoking status: Never   Smokeless tobacco: Never  Substance Use Topics   Alcohol use: Yes    Comment: occasionally, about once a month     Current Outpatient Medications:    escitalopram (LEXAPRO) 10 MG tablet, Take 1 tablet (10 mg total) by mouth daily., Disp: 90 tablet, Rfl: 1   fluticasone (FLONASE) 50 MCG/ACT nasal spray, Place 2 sprays into both nostrils daily., Disp: 16 g, Rfl: 0   levothyroxine (SYNTHROID) 200 MCG tablet, TAKE 1 TABLET (200 MCG TOTAL) BY MOUTH DAILY BEFORE BREAKFAST. TAKE AN EXTRA HALF PILL ONCE A WEEK, Disp: 192 tablet, Rfl: 0   triamterene-hydrochlorothiazide (DYAZIDE) 37.5-25 MG capsule, Take 1 each (1 capsule total) by mouth daily., Disp: 90 capsule, Rfl: 1   valACYclovir (VALTREX) 500 MG tablet, TAKE 1 TABLET BY MOUTH 2 TIMES DAILY FOR OUTBREAKS AND ONCE DAILY FOR PREVENTION AS DIRECTED, Disp: 60 tablet, Rfl: 3   Vitamin D, Ergocalciferol, (DRISDOL) 1.25 MG (50000 UNIT) CAPS capsule, Take 1 capsule (50,000 Units total) by mouth every 7 (seven) days., Disp: 12 capsule, Rfl: 1  Allergies  Allergen Reactions  Hydrocodone Itching    I personally reviewed active problem list, medication list, allergies, family history, social history, health maintenance with the patient/caregiver today.   ROS  Ten systems reviewed and is negative except as mentioned in HPI   Objective  Vitals:   02/24/22 1448  BP: 126/76  Pulse: 88  Resp: 18  Temp: 98.3 F (36.8 C)  TempSrc: Oral  SpO2: 97%  Height: '5\' 10"'$  (1.778 m)    Body mass index is 51.02 kg/m.  Physical Exam  Constitutional: Patient appears well-developed and  well-nourished. Obese  No distress.  HEENT: head atraumatic, normocephalic, pupils equal and reactive to light, ears normal TM, sty on right lower eye lid , neck supple, no tenderness during percussion of sinus. No facial redness, but slightly puffy under right eye  Cardiovascular: Normal rate, regular rhythm and normal heart sounds.  No murmur heard. No BLE edema. Pulmonary/Chest: Effort normal and breath sounds normal. No respiratory distress. Abdominal: Soft.  There is no tenderness. Psychiatric: Patient has a normal mood and affect. behavior is normal. Judgment and thought content normal.   Recent Results (from the past 2160 hour(s))  TSH     Status: Abnormal   Collection Time: 02/03/22  8:25 AM  Result Value Ref Range   TSH 5.68 (H) mIU/L    Comment:           Reference Range .           > or = 20 Years  0.40-4.50 .                Pregnancy Ranges           First trimester    0.26-2.66           Second trimester   0.55-2.73           Third trimester    0.43-2.91   CBC with Differential/Platelet     Status: Abnormal   Collection Time: 02/03/22  8:25 AM  Result Value Ref Range   WBC 6.9 3.8 - 10.8 Thousand/uL   RBC 4.18 3.80 - 5.10 Million/uL   Hemoglobin 10.2 (L) 11.7 - 15.5 g/dL   HCT 32.3 (L) 35.0 - 45.0 %   MCV 77.3 (L) 80.0 - 100.0 fL   MCH 24.4 (L) 27.0 - 33.0 pg   MCHC 31.6 (L) 32.0 - 36.0 g/dL   RDW 15.1 (H) 11.0 - 15.0 %   Platelets 284 140 - 400 Thousand/uL   MPV 10.7 7.5 - 12.5 fL   Neutro Abs 3,678 1,500 - 7,800 cells/uL   Lymphs Abs 2,560 850 - 3,900 cells/uL   Absolute Monocytes 524 200 - 950 cells/uL   Eosinophils Absolute 110 15 - 500 cells/uL   Basophils Absolute 28 0 - 200 cells/uL   Neutrophils Relative % 53.3 %   Total Lymphocyte 37.1 %   Monocytes Relative 7.6 %   Eosinophils Relative 1.6 %   Basophils Relative 0.4 %  Iron, TIBC and Ferritin Panel     Status: Abnormal   Collection Time: 02/03/22  8:25 AM  Result Value Ref Range   Iron 47 40 - 190  mcg/dL   TIBC 412 250 - 450 mcg/dL (calc)   %SAT 11 (L) 16 - 45 % (calc)   Ferritin 6 (L) 16 - 232 ng/mL    PHQ2/9:    02/24/2022    2:51 PM 02/03/2022    7:56 AM 01/05/2022    2:03 PM 10/03/2021    1:47  PM 10/03/2021   10:40 AM  Depression screen PHQ 2/9  Decreased Interest 0 0 0 0 0  Down, Depressed, Hopeless 0 0 0 0 1  PHQ - 2 Score 0 0 0 0 1  Altered sleeping 0 0 0 0 0  Tired, decreased energy 0 0 0 0 0  Change in appetite 0 0 0 0 3  Feeling bad or failure about yourself  0 0 0 0 1  Trouble concentrating 0 0 0 0 0  Moving slowly or fidgety/restless 0 0 0 0 0  Suicidal thoughts 0 0 0 0 0  PHQ-9 Score 0 0 0 0 5    phq 9 is negative   Fall Risk:    02/24/2022    2:51 PM 02/03/2022    7:56 AM 01/05/2022    2:03 PM 10/03/2021    1:47 PM 10/03/2021   10:40 AM  Fall Risk   Falls in the past year? 0 1 0 0 0  Number falls in past yr:  0 0 0   Injury with Fall?  0 0 0   Risk for fall due to : No Fall Risks No Fall Risks No Fall Risks No Fall Risks No Fall Risks  Follow up Falls prevention discussed Falls prevention discussed Falls prevention discussed Falls prevention discussed Falls prevention discussed;Education provided      Functional Status Survey: Is the patient deaf or have difficulty hearing?: No Does the patient have difficulty seeing, even when wearing glasses/contacts?: Yes Does the patient have difficulty concentrating, remembering, or making decisions?: No Does the patient have difficulty walking or climbing stairs?: No Does the patient have difficulty dressing or bathing?: No Does the patient have difficulty doing errands alone such as visiting a doctor's office or shopping?: No    Assessment & Plan  1. Hordeolum externum of right lower eyelid  Advised warm compressed. Call back if worsening of symptoms

## 2022-03-06 ENCOUNTER — Other Ambulatory Visit: Payer: Self-pay

## 2022-03-06 DIAGNOSIS — D500 Iron deficiency anemia secondary to blood loss (chronic): Secondary | ICD-10-CM

## 2022-03-06 LAB — POC HEMOCCULT BLD/STL (HOME/3-CARD/SCREEN)
Card #2 Fecal Occult Blod, POC: NEGATIVE
Card #3 Fecal Occult Blood, POC: NEGATIVE
Fecal Occult Blood, POC: NEGATIVE

## 2022-05-05 NOTE — Progress Notes (Deleted)
Name: Michelle Ware   MRN: [id number]    DOB: May 05, 1980   Date:05/05/2022       Progress Note  Subjective  Chief Complaint  Follow Up  HPI  GAD: she saw me 01/31/2021 because of increase in anxiety, last visit with therapist,  Sherre Scarlet was about one month ago  . She has been taking lexapro and is doing much better .  OSA: she started CPAP about 3 months ago , compliant, wearing machine from the time she goes to bed around 10:30 pm until 4:30 am when she wakes up in am. She has noticed she has not been feeling as tired during the day, no longer dozing off at work, denies waking up with a headache, tolerating the machine well It is beneficial for her   Dysthymia: lexapro seems to be working, no longer having crying spells, sadness has resolved. Feeling better. She is no longer feeling as frightened about letting her son stay with family and friends again. No longer worried as much about the bed bugs .Doing well, continue lexapro   HTN: bp is slightly elevated today, but at goal at home,  she denies chest pain, dizziness, no recent episodes of palpitations    Vitamin D deficiency: taking supplementation and level improved , continue supplementation   Anemia:, she states cycles were not as heavy but since delivery back in 09/2016 cycles are heavy, lasts 4-5 days. She states it is so heavy at night that she needs to wear depends. Discussed importance of stopping the heavy bleeding, she is willing to see Dr. Cherly Hensen to discuss ablation, IUD versus other options I also reminded her to have hemoccult stool cards    Morbid Obesity: insurance denied medication for weight loss, and they don't cover bariatric surgery. She had lost a lot of weight when depressed, but now that mood has improved her weight is going up, baseline weight was 331 lbs , dropped to below 300 lbs a but gradually going up and today it is 355.6 lbs .She is doing a walking program through her phone, she is eating small portions  and intermittent fasting - from 7 pm until 11:45 am  GERD: she states she changed her diet , packing her lunch and not having indigestion. Controlled with life style modifications, very seldom takes PPI. Unchanged    Post-surgical hypothyroidism: she states she has been taking medication every morning, Last TSH  was above goal, she is only taking one daily , she did not go up to on extra half, we will recheck labs today. Denies dysphagia, or change in bowel movement.   Low HDL: she states she has been eating more fish , she started an indoor walking program through her phone    Patient Active Problem List   Diagnosis Date Noted   Gastroesophageal reflux disease without esophagitis 07/03/2021   Anxiety 07/03/2021   GAD (generalized anxiety disorder) 07/03/2021   Dysthymia 07/03/2021   History of thyroidectomy 08/05/2017   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 of myomectomy and CHTN  09/28/2016   Tenosynovitis of wrist 05/21/2016   Morbid obesity with BMI of 40.0-44.9, adult 03/31/2016   H/O myomectomy 03/11/2015   Essential hypertension 11/28/2014   Hypertrichosis 07/24/2014   Vitamin D deficiency 07/24/2014   PCOS (polycystic ovarian syndrome) 07/24/2014   History of abnormal cervical Pap smear 11/24/2011   Genital herpes 11/29/2008    Past Surgical History:  Procedure Laterality Date   BIOPSY THYROID  07/2015  Dr. Tedd Sias   CESAREAN SECTION N/A 09/28/2016   Procedure: Primary CESAREAN SECTION;  Surgeon: Maxie Better, MD;  Location: [facility] BIRTHING SUITES;  Service: Obstetrics;  Laterality: N/A;  EDD: 10/19/16 Allergy: Hydrocodone-Acetaminophen   LAPAROSCOPIC GELPORT ASSISTED MYOMECTOMY N/A 03/11/2015   Procedure: LAPAROSCOPIC  MYOMECTOMY--attempted;  Surgeon: Hildred Laser, MD;  Location: [facility] ORS;  Service: Gynecology;  Laterality: N/A;   LAPAROTOMY N/A 03/11/2015   Procedure: LAPAROTOMY--MYOMECTOMY;  Surgeon: Hildred Laser, MD;  Location:  [facility] ORS;  Service: Gynecology;  Laterality: N/A;   MOUTH SURGERY  1996   TOTAL THYROIDECTOMY Bilateral 03/05/2017   Dr. Selena Batten [facility]    Family History  Problem Relation Age of Onset   Thyroid disease Mother    Hypertension Mother    Cancer Maternal Aunt    Cancer Maternal Grandfather    Prostate cancer Maternal Grandfather    Stroke Maternal Grandmother    Breast cancer Paternal Grandmother    Heart failure Paternal Grandfather    Liver disease Brother        Fatty Liver    Social History   Tobacco Use   Smoking status: Never   Smokeless tobacco: Never  Substance Use Topics   Alcohol use: Yes    Comment: occasionally, about once a month     Current Outpatient Medications:    escitalopram (LEXAPRO) 10 MG tablet, Take 1 tablet (10 mg total) by mouth daily., Disp: 90 tablet, Rfl: 1   fluticasone (FLONASE) 50 MCG/ACT nasal spray, Place 2 sprays into both nostrils daily., Disp: 16 g, Rfl: 0   levothyroxine (SYNTHROID) 200 MCG tablet, TAKE 1 TABLET (200 MCG TOTAL) BY MOUTH DAILY BEFORE BREAKFAST. TAKE AN EXTRA HALF PILL ONCE A WEEK, Disp: 192 tablet, Rfl: 0   triamterene-hydrochlorothiazide (DYAZIDE) 37.5-25 MG capsule, Take 1 each (1 capsule total) by mouth daily., Disp: 90 capsule, Rfl: 1   valACYclovir (VALTREX) 500 MG tablet, TAKE 1 TABLET BY MOUTH 2 TIMES DAILY FOR OUTBREAKS AND ONCE DAILY FOR PREVENTION AS DIRECTED, Disp: 60 tablet, Rfl: 3   Vitamin D, Ergocalciferol, (DRISDOL) 1.25 MG (50000 UNIT) CAPS capsule, Take 1 capsule (50,000 Units total) by mouth every 7 (seven) days., Disp: 12 capsule, Rfl: 1  Allergies  Allergen Reactions   Hydrocodone Itching    I personally reviewed active problem list, medication list, allergies, family history, social history, health maintenance with the patient/caregiver today.   ROS  ***  Objective  There were no vitals filed for this visit.  There is no height or weight on file to calculate BMI.  Physical Exam ***  Recent  Results (from the past 2160 hour(s))  POC Hemoccult Bld/Stl (3-Cd Home Screen)     Status: None   Collection Time: 03/06/22  4:23 PM  Result Value Ref Range   Card #1 Date 02/25/22    Fecal Occult Blood, POC Negative Negative   Card #2 Date 02/26/22    Card #2 Fecal Occult Blod, POC Negative    Card #3 Date 02/27/22    Card #3 Fecal Occult Blood, POC Negative     PHQ2/9:    02/24/2022    2:51 PM 02/03/2022    7:56 AM 01/05/2022    2:03 PM 10/03/2021    1:47 PM 10/03/2021   10:40 AM  Depression screen PHQ 2/9  Decreased Interest 0 0 0 0 0  Down, Depressed, Hopeless 0 0 0 0 1  PHQ - 2 Score 0 0 0 0 1  Altered sleeping 0 0 0 0  0  Tired, decreased energy 0 0 0 0 0  Change in appetite 0 0 0 0 3  Feeling bad or failure about yourself  0 0 0 0 1  Trouble concentrating 0 0 0 0 0  Moving slowly or fidgety/restless 0 0 0 0 0  Suicidal thoughts 0 0 0 0 0  PHQ-9 Score 0 0 0 0 5    phq 9 is {gen pos [id number]}   Fall Risk:    02/24/2022    2:51 PM 02/03/2022    7:56 AM 01/05/2022    2:03 PM 10/03/2021    1:47 PM 10/03/2021   10:40 AM  Fall Risk   Falls in the past year? 0 1 0 0 0  Number falls in past yr:  0 0 0   Injury with Fall?  0 0 0   Risk for fall due to : No Fall Risks No Fall Risks No Fall Risks No Fall Risks No Fall Risks  Follow up Falls prevention discussed Falls prevention discussed Falls prevention discussed Falls prevention discussed Falls prevention discussed;Education provided      Functional Status Survey:      Assessment & Plan  *** There are no diagnoses linked to this encounter.

## 2022-05-06 ENCOUNTER — Ambulatory Visit: Payer: 59 | Admitting: Family Medicine

## 2022-05-11 ENCOUNTER — Encounter: Payer: Self-pay | Admitting: Family Medicine

## 2022-06-02 NOTE — Progress Notes (Unsigned)
Name: Michelle Ware   MRN: [id number]    DOB: 1980/10/16   Date:06/03/2022       Progress Note  Subjective  Chief Complaint  Fatigue  HPI  Fatigue: she states feeling extra tired over the past weeks. She returned from a cruise one month ago and had an URI , she is coughing but denies sob. She is taking levothyroxine 200 mcg one daily  ( not the extra half pill once a week) and last level was elevated . Advised to also take it on empty stomach and take bp medication about one hour later. She has heavy cycles an is waiting to see gyn. She stopped taking iron pills due to constipation, advised her to add colace and resume supplementation She has a high BMI and has gained weight in the past 4 months. Reminded her to try to be more active and eat mindfully   Patient Active Problem List   Diagnosis Date Noted   Gastroesophageal reflux disease without esophagitis 07/03/2021   Anxiety 07/03/2021   GAD (generalized anxiety disorder) 07/03/2021   Dysthymia 07/03/2021   History of thyroidectomy 08/05/2017   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 of myomectomy and CHTN  09/28/2016   Tenosynovitis of wrist 05/21/2016   Morbid obesity with BMI of 40.0-44.9, adult (HCC) 03/31/2016   H/O myomectomy 03/11/2015   Essential hypertension 11/28/2014   Hypertrichosis 07/24/2014   Vitamin D deficiency 07/24/2014   PCOS (polycystic ovarian syndrome) 07/24/2014   History of abnormal cervical Pap smear 11/24/2011   Genital herpes 11/29/2008    Past Surgical History:  Procedure Laterality Date   BIOPSY THYROID  07/2015   Dr. Tedd Sias   CESAREAN SECTION N/A 09/28/2016   Procedure: Primary CESAREAN SECTION;  Surgeon: Maxie Better, MD;  Location: [facility] BIRTHING SUIT[facility];  Service: Obstetrics;  Laterality: N/A;  EDD: 10/19/16 Allergy: Hydrocodone-Acetaminophen   LAPAROSCOPIC GELPORT ASSISTED MYOMECTOMY N/A 03/11/2015   Procedure: LAPAROSCOPIC  MYOMECTOMY--attempted;   Surgeon: Hildred Laser, MD;  Location: [facility] ORS;  Service: Gynecology;  Laterality: N/A;   LAPAROTOMY N/A 03/11/2015   Procedure: LAPAROTOMY--MYOMECTOMY;  Surgeon: Hildred Laser, MD;  Location: [facility] ORS;  Service: Gynecology;  Laterality: N/A;   MOUTH SURGERY  1996   TOTAL THYROIDECTOMY Bilateral 03/05/2017   Dr. Selena Batten [facility]    Family History  Problem Relation Age of Onset   Thyroid disease Mother    Hypertension Mother    Cancer Maternal Aunt    Cancer Maternal Grandfather    Prostate cancer Maternal Grandfather    Stroke Maternal Grandmother    Breast cancer Paternal Grandmother    Heart failure Paternal Grandfather    Liver disease Brother        Fatty Liver    Social History   Tobacco Use   Smoking status: Never   Smokeless tobacco: Never  Substance Use Topics   Alcohol use: Yes    Comment: occasionally, about once a month     Current Outpatient Medications:    escitalopram (LEXAPRO) 10 MG tablet, Take 1 tablet (10 mg total) by mouth daily., Disp: 90 tablet, Rfl: 1   levothyroxine (SYNTHROID) 200 MCG tablet, TAKE 1 TABLET (200 MCG TOTAL) BY MOUTH DAILY BEFORE BREAKFAST. TAKE AN EXTRA HALF PILL ONCE A WEEK, Disp: 192 tablet, Rfl: 0   triamterene-hydrochlorothiazide (DYAZIDE) 37.5-25 MG capsule, Take 1 each (1 capsule total) by mouth daily., Disp: 90 capsule, Rfl: 1   valACYclovir (VALTREX) 500 MG tablet, TAKE 1 TABLET BY MOUTH 2  TIMES DAILY FOR OUTBREAKS AND ONCE DAILY FOR PREVENTION AS DIRECTED, Disp: 60 tablet, Rfl: 3   Vitamin D, Ergocalciferol, (DRISDOL) 1.25 MG (50000 UNIT) CAPS capsule, Take 1 capsule (50,000 Units total) by mouth every 7 (seven) days., Disp: 12 capsule, Rfl: 1   fluticasone (FLONASE) 50 MCG/ACT nasal spray, Place 2 sprays into both nostrils daily. (Patient not taking: Reported on 06/03/2022), Disp: 16 g, Rfl: 0  Allergies  Allergen Reactions   Hydrocodone Itching    I personally reviewed active problem list, medication list, allergies, family  history, social history, health maintenance with the patient/caregiver today.   ROS  Constitutional: Negative for fever , positive for  weight change.  Respiratory: positive  for cough but no  shortness of breath.   Cardiovascular: Negative for chest pain or palpitations.  Gastrointestinal: Negative for abdominal pain, no bowel changes.  Musculoskeletal: Negative for gait problem or joint swelling.  Skin: Negative for rash.  Neurological: Negative for dizziness or headache.  No other specific complaints in a complete review of systems (except as listed in HPI above).   Objective  Vitals:   06/03/22 1401  BP: 122/76  Pulse: 91  Resp: 16  SpO2: 99%  Weight: (!) 367 lb (166.5 kg)  Height: 5\' 10"  (1.778 m)    Body mass index is 52.66 kg/m.  Physical Exam  Constitutional: Patient appears well-developed and well-nourished. Obese  No distress.  HEENT: head atraumatic, normocephalic, pupils equal and reactive to light, neck supple Cardiovascular: Normal rate, regular rhythm and normal heart sounds.  No murmur heard. 1 plus  BLE edema. Pulmonary/Chest: Effort normal and breath sounds normal. No respiratory distress. Abdominal: Soft.  There is no tenderness. Psychiatric: Patient has a normal mood and affect. behavior is normal. Judgment and thought content normal.   Recent Results (from the past 2160 hour(s))  POC Hemoccult Bld/Stl (3-Cd Home Screen)     Status: None   Collection Time: 03/06/22  4:23 PM  Result Value Ref Range   Card #1 Date 02/25/22    Fecal Occult Blood, POC Negative Negative   Card #2 Date 02/26/22    Card #2 Fecal Occult Blod, POC Negative    Card #3 Date 02/27/22    Card #3 Fecal Occult Blood, POC Negative     PHQ2/9:    06/03/2022    2:00 PM 02/24/2022    2:51 PM 02/03/2022    7:56 AM 01/05/2022    2:03 PM 10/03/2021    1:47 PM  Depression screen PHQ 2/9  Decreased Interest 0 0 0 0 0  Down, Depressed, Hopeless 0 0 0 0 0  PHQ - 2 Score 0 0 0 0 0  Altered  sleeping 0 0 0 0 0  Tired, decreased energy 2 0 0 0 0  Change in appetite 0 0 0 0 0  Feeling bad or failure about yourself  0 0 0 0 0  Trouble concentrating 0 0 0 0 0  Moving slowly or fidgety/restless 0 0 0 0 0  Suicidal thoughts 0 0 0 0 0  PHQ-9 Score 2 0 0 0 0    phq 9 is negative   Fall Risk:    06/03/2022    2:00 PM 02/24/2022    2:51 PM 02/03/2022    7:56 AM 01/05/2022    2:03 PM 10/03/2021    1:47 PM  Fall Risk   Falls in the past year? 1 0 1 0 0  Number falls in past yr: 0  0 0 0  Injury with Fall? 0  0 0 0  Risk for fall due to : No Fall Risks No Fall Risks No Fall Risks No Fall Risks No Fall Risks  Follow up Falls prevention discussed Falls prevention discussed Falls prevention discussed Falls prevention discussed Falls prevention discussed      Functional Status Survey: Is the patient deaf or have difficulty hearing?: No Does the patient have difficulty seeing, even when wearing glasses/contacts?: No Does the patient have difficulty concentrating, remembering, or making decisions?: No Does the patient have difficulty walking or climbing stairs?: No Does the patient have difficulty dressing or bathing?: No Does the patient have difficulty doing errands alone such as visiting a doctor's office or shopping?: No    Assessment & Plan  1. Morbid obesity with BMI of 40.0-44.9, adult [facility])  Discussed with the patient the risk posed by an increased BMI. Discussed importance of portion control, calorie counting and at least 150 minutes of physical activity weekly. Avoid sweet beverages and drink more water. Eat at least 6 servings of fruit and vegetables daily    2. Post-surgical hypothyroidism  - TSH  3. Iron deficiency anemia due to chronic blood loss  - CBC with Differential/Platelet - Iron, TIBC and Ferritin Panel  4. Cough, unspecified type  - CBC with Differential/Platelet  5. Other fatigue  - CBC with Differential/Platelet - Iron, TIBC and Ferritin  Panel - COMPLETE METABOLIC PANEL WITH GFR - B12 and Folate Panel

## 2022-06-03 ENCOUNTER — Ambulatory Visit: Payer: 59 | Admitting: Family Medicine

## 2022-06-03 ENCOUNTER — Encounter: Payer: Self-pay | Admitting: Family Medicine

## 2022-06-03 VITALS — BP 122/76 | HR 91 | Resp 16 | Ht 70.0 in | Wt 367.0 lb

## 2022-06-03 DIAGNOSIS — R5383 Other fatigue: Secondary | ICD-10-CM

## 2022-06-03 DIAGNOSIS — N1831 Chronic kidney disease, stage 3a: Secondary | ICD-10-CM | POA: Insufficient documentation

## 2022-06-03 DIAGNOSIS — E890 Postprocedural hypothyroidism: Secondary | ICD-10-CM | POA: Diagnosis not present

## 2022-06-03 DIAGNOSIS — Z6841 Body Mass Index (BMI) 40.0 and over, adult: Secondary | ICD-10-CM

## 2022-06-03 DIAGNOSIS — R059 Cough, unspecified: Secondary | ICD-10-CM | POA: Diagnosis not present

## 2022-06-03 DIAGNOSIS — D500 Iron deficiency anemia secondary to blood loss (chronic): Secondary | ICD-10-CM

## 2022-06-03 MED ORDER — DOCUSATE SODIUM 100 MG PO CAPS
100.0000 mg | ORAL_CAPSULE | Freq: Two times a day (BID) | ORAL | 0 refills | Status: DC
Start: 2022-06-03 — End: 2023-04-16

## 2022-06-04 LAB — CBC WITH DIFFERENTIAL/PLATELET
Absolute Monocytes: 570 cells/uL (ref 200–950)
Basophils Absolute: 46 cells/uL (ref 0–200)
Basophils Relative: 0.6 %
Eosinophils Absolute: 169 cells/uL (ref 15–500)
Eosinophils Relative: 2.2 %
HCT: 32.5 % — ABNORMAL LOW (ref 35.0–45.0)
Hemoglobin: 10.1 g/dL — ABNORMAL LOW (ref 11.7–15.5)
Lymphs Abs: 2857 cells/uL (ref 850–3900)
MCH: 23.7 pg — ABNORMAL LOW (ref 27.0–33.0)
MCHC: 31.1 g/dL — ABNORMAL LOW (ref 32.0–36.0)
MCV: 76.3 fL — ABNORMAL LOW (ref 80.0–100.0)
MPV: 9.9 fL (ref 7.5–12.5)
Monocytes Relative: 7.4 %
Neutro Abs: 4058 cells/uL (ref 1500–7800)
Neutrophils Relative %: 52.7 %
Platelets: 284 10*3/uL (ref 140–400)
RBC: 4.26 10*6/uL (ref 3.80–5.10)
RDW: 15.1 % — ABNORMAL HIGH (ref 11.0–15.0)
Total Lymphocyte: 37.1 %
WBC: 7.7 10*3/uL (ref 3.8–10.8)

## 2022-06-04 LAB — COMPLETE METABOLIC PANEL WITH GFR
AG Ratio: 1.4 (calc) (ref 1.0–2.5)
ALT: 18 U/L (ref 6–29)
AST: 27 U/L (ref 10–30)
Albumin: 4.3 g/dL (ref 3.6–5.1)
Alkaline phosphatase (APISO): 53 U/L (ref 31–125)
BUN/Creatinine Ratio: 15 (calc) (ref 6–22)
BUN: 16 mg/dL (ref 7–25)
CO2: 29 mmol/L (ref 20–32)
Calcium: 9 mg/dL (ref 8.6–10.2)
Chloride: 103 mmol/L (ref 98–110)
Creat: 1.04 mg/dL — ABNORMAL HIGH (ref 0.50–0.99)
Globulin: 3.1 g/dL (calc) (ref 1.9–3.7)
Glucose, Bld: 98 mg/dL (ref 65–99)
Potassium: 4 mmol/L (ref 3.5–5.3)
Sodium: 139 mmol/L (ref 135–146)
Total Bilirubin: 0.3 mg/dL (ref 0.2–1.2)
Total Protein: 7.4 g/dL (ref 6.1–8.1)
eGFR: 68 mL/min/{1.73_m2} (ref >=60)

## 2022-06-04 LAB — TSH: TSH: 3.7 mIU/L

## 2022-06-04 LAB — B12 AND FOLATE PANEL
Folate: 7.3 ng/mL
Vitamin B-12: 405 pg/mL (ref 200–1100)

## 2022-06-04 LAB — IRON,TIBC AND FERRITIN PANEL
%SAT: 11 % (calc) — ABNORMAL LOW (ref 16–45)
Ferritin: 9 ng/mL — ABNORMAL LOW (ref 16–232)
Iron: 48 ug/dL (ref 40–190)
TIBC: 429 mcg/dL (calc) (ref 250–450)

## 2022-06-29 ENCOUNTER — Other Ambulatory Visit: Payer: Self-pay | Admitting: Family Medicine

## 2022-06-29 DIAGNOSIS — E890 Postprocedural hypothyroidism: Secondary | ICD-10-CM

## 2022-07-31 NOTE — Progress Notes (Deleted)
Name: Michelle Ware   MRN: [id number]    DOB: 1980/10/04   Date:07/31/2022       Progress Note  Subjective  Chief Complaint  Follow Up  HPI  GAD: she saw me 01/31/2021 because of increase in anxiety, last visit with therapist,  Sherre Scarlet was about one month ago  . She has been taking lexapro and is doing much better .  OSA: she started CPAP about 3 months ago , compliant, wearing machine from the time she goes to bed around 10:30 pm until 4:30 am when she wakes up in am. She has noticed she has not been feeling as tired during the day, no longer dozing off at work, denies waking up with a headache, tolerating the machine well It is beneficial for her   Dysthymia: lexapro seems to be working, no longer having crying spells, sadness has resolved. Feeling better. She is no longer feeling as frightened about letting her son stay with family and friends again. No longer worried as much about the bed bugs .Doing well, continue lexapro   HTN: bp is slightly elevated today, but at goal at home,  she denies chest pain, dizziness, no recent episodes of palpitations    Vitamin D deficiency: taking supplementation and level improved , continue supplementation   Anemia:, she states cycles were not as heavy but since delivery back in 09/2016 cycles are heavy, lasts 4-5 days. She states it is so heavy at night that she needs to wear depends. Discussed importance of stopping the heavy bleeding, she is willing to see Dr. Cherly Hensen to discuss ablation, IUD versus other options I also reminded her to have hemoccult stool cards    Morbid Obesity: insurance denied medication for weight loss, and they don't cover bariatric surgery. She had lost a lot of weight when depressed, but now that mood has improved her weight is going up, baseline weight was 331 lbs , dropped to below 300 lbs a but gradually going up and today it is 355.6 lbs .She is doing a walking program through her phone, she is eating small portions  and intermittent fasting - from 7 pm until 11:45 am  GERD: she states she changed her diet , packing her lunch and not having indigestion. Controlled with life style modifications, very seldom takes PPI. Unchanged    Post-surgical hypothyroidism: she states she has been taking medication every morning, Last TSH  was above goal, she is only taking one daily , she did not go up to on extra half, we will recheck labs today. Denies dysphagia, or change in bowel movement.   Low HDL: she states she has been eating more fish , she started an indoor walking program through her phone    Patient Active Problem List   Diagnosis Date Noted   Stage 3a chronic kidney disease (HCC) 06/03/2022   Gastroesophageal reflux disease without esophagitis 07/03/2021   Anxiety 07/03/2021   GAD (generalized anxiety disorder) 07/03/2021   Dysthymia 07/03/2021   History of thyroidectomy 08/05/2017   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 of myomectomy and CHTN  09/28/2016   Tenosynovitis of wrist 05/21/2016   Morbid obesity with BMI of 40.0-44.9, adult (HCC) 03/31/2016   H/O myomectomy 03/11/2015   Essential hypertension 11/28/2014   Hypertrichosis 07/24/2014   Vitamin D deficiency 07/24/2014   PCOS (polycystic ovarian syndrome) 07/24/2014   History of abnormal cervical Pap smear 11/24/2011   Genital herpes 11/29/2008    Past Surgical History:  Procedure Laterality Date   BIOPSY THYROID  07/2015   Dr. Tedd Sias   CESAREAN SECTION N/A 09/28/2016   Procedure: Primary CESAREAN SECTION;  Surgeon: Maxie Better, MD;  Location: [facility] BIRTHING SUITES;  Service: Obstetrics;  Laterality: N/A;  EDD: 10/19/16 Allergy: Hydrocodone-Acetaminophen   LAPAROSCOPIC GELPORT ASSISTED MYOMECTOMY N/A 03/11/2015   Procedure: LAPAROSCOPIC  MYOMECTOMY--attempted;  Surgeon: Hildred Laser, MD;  Location: [facility] ORS;  Service: Gynecology;  Laterality: N/A;   LAPAROTOMY N/A 03/11/2015   Procedure:  LAPAROTOMY--MYOMECTOMY;  Surgeon: Hildred Laser, MD;  Location: [facility] ORS;  Service: Gynecology;  Laterality: N/A;   MOUTH SURGERY  1996   TOTAL THYROIDECTOMY Bilateral 03/05/2017   Dr. Selena Batten [facility]    Family History  Problem Relation Age of Onset   Thyroid disease Mother    Hypertension Mother    Cancer Maternal Aunt    Cancer Maternal Grandfather    Prostate cancer Maternal Grandfather    Stroke Maternal Grandmother    Breast cancer Paternal Grandmother    Heart failure Paternal Grandfather    Liver disease Brother        Fatty Liver    Social History   Tobacco Use   Smoking status: Never   Smokeless tobacco: Never  Substance Use Topics   Alcohol use: Yes    Comment: occasionally, about once a month     Current Outpatient Medications:    docusate sodium (COLACE) 100 MG capsule, Take 1 capsule (100 mg total) by mouth 2 (two) times daily., Disp: 30 capsule, Rfl: 0   escitalopram (LEXAPRO) 10 MG tablet, Take 1 tablet (10 mg total) by mouth daily., Disp: 90 tablet, Rfl: 1   fluticasone (FLONASE) 50 MCG/ACT nasal spray, Place 2 sprays into both nostrils daily. (Patient not taking: Reported on 06/03/2022), Disp: 16 g, Rfl: 0   levothyroxine (SYNTHROID) 200 MCG tablet, TAKE 1 TABLET (200 MCG TOTAL) BY MOUTH DAILY BEFORE BREAKFAST. TAKE AN EXTRA HALF PILL ONCE A WEEK, Disp: 96 tablet, Rfl: 0   triamterene-hydrochlorothiazide (DYAZIDE) 37.5-25 MG capsule, Take 1 each (1 capsule total) by mouth daily., Disp: 90 capsule, Rfl: 1   valACYclovir (VALTREX) 500 MG tablet, TAKE 1 TABLET BY MOUTH 2 TIMES DAILY FOR OUTBREAKS AND ONCE DAILY FOR PREVENTION AS DIRECTED, Disp: 60 tablet, Rfl: 3   Vitamin D, Ergocalciferol, (DRISDOL) 1.25 MG (50000 UNIT) CAPS capsule, Take 1 capsule (50,000 Units total) by mouth every 7 (seven) days., Disp: 12 capsule, Rfl: 1  Allergies  Allergen Reactions   Hydrocodone Itching    I personally reviewed active problem list, medication list, allergies, family history,  social history, health maintenance with the patient/caregiver today.   ROS  ***  Objective  There were no vitals filed for this visit.  There is no height or weight on file to calculate BMI.  Physical Exam ***   PHQ2/9:    06/03/2022    2:00 PM 02/24/2022    2:51 PM 02/03/2022    7:56 AM 01/05/2022    2:03 PM 10/03/2021    1:47 PM  Depression screen PHQ 2/9  Decreased Interest 0 0 0 0 0  Down, Depressed, Hopeless 0 0 0 0 0  PHQ - 2 Score 0 0 0 0 0  Altered sleeping 0 0 0 0 0  Tired, decreased energy 2 0 0 0 0  Change in appetite 0 0 0 0 0  Feeling bad or failure about yourself  0 0 0 0 0  Trouble concentrating 0 0 0 0 0  Moving slowly or fidgety/restless 0 0 0 0 0  Suicidal thoughts 0 0 0 0 0  PHQ-9 Score 2 0 0 0 0    phq 9 is {gen pos [id number]}   Fall Risk:    06/03/2022    2:00 PM 02/24/2022    2:51 PM 02/03/2022    7:56 AM 01/05/2022    2:03 PM 10/03/2021    1:47 PM  Fall Risk   Falls in the past year? 1 0 1 0 0  Number falls in past yr: 0  0 0 0  Injury with Fall? 0  0 0 0  Risk for fall due to : No Fall Risks No Fall Risks No Fall Risks No Fall Risks No Fall Risks  Follow up Falls prevention discussed Falls prevention discussed Falls prevention discussed Falls prevention discussed Falls prevention discussed      Functional Status Survey:      Assessment & Plan  *** There are no diagnoses linked to this encounter.

## 2022-08-03 ENCOUNTER — Ambulatory Visit: Payer: 59 | Admitting: Family Medicine

## 2022-09-01 ENCOUNTER — Other Ambulatory Visit: Payer: Self-pay | Admitting: Family Medicine

## 2022-09-01 DIAGNOSIS — I10 Essential (primary) hypertension: Secondary | ICD-10-CM

## 2022-09-01 DIAGNOSIS — F411 Generalized anxiety disorder: Secondary | ICD-10-CM

## 2022-09-01 DIAGNOSIS — F341 Dysthymic disorder: Secondary | ICD-10-CM

## 2022-09-03 NOTE — Telephone Encounter (Signed)
MB is full. Also sent MyChart message

## 2022-09-08 NOTE — Progress Notes (Unsigned)
Name: Michelle Ware   MRN: [id number]    DOB: 14-Apr-1980   Date:09/09/2022       Progress Note  Subjective  Chief Complaint  Follow Up  HPI  GAD: she saw me 01/31/2021 because of increase in anxiety, last visit with therapist,  Sherre Scarlet was about one month ago  . She has been taking lexapro and is doing well.   OSA: she started CPAP end of 2023 , compliant, wearing machine from the time she goes to bed around 10:30 pm until 4:30 am when she wakes up in am.   Dysthymia: lexapro seems to be working, no longer having crying spells, sadness has resolved.  She is no longer feeling as frightened about letting her son stay with family and friends again. No longer worried as much about the bed bugs .She asked about stopped medication and explained we should wait until next Spring but she can take half dose now if desired   HTN: bp is at goal today. She denies chest pain, dizziness, no recent episodes of palpitations . Explained fast food is high in salt, needs to cook more at home    Vitamin D deficiency: she needs to resume otc supplementation   Iron deficiency anemia: she states cycles were not as heavy but since delivery back in 09/2016 cycles are heavy again  lasts 4-5 days. She states it is so heavy at night that she needs to wear depends at night . Discussed importance of stopping the heavy bleeding. She also had a myomectomy prior to her son's birth by Dr. Valentino Saxon. We tried referring her back and did not get a phone call from Gyn, we will try referring her again today    Morbid Obesity: insurance denied medication for weight loss, and they don't cover bariatric surgery. She had lost a lot of weight when depressed, but now that mood has improved her weight is going up, baseline weight was 331 lbs , dropped to below 300 lbs a but gradually going up and today it is 373 lbs She is no longer exercising however her son is starting [employer] on Monday and will go to after school program , she  will go to the gym 4 days a week right after work and pick him up afterwards . Insurance does not pay for weight loss medication. Discussed healthier diet, she eats out frequently, discussed meal prepping   GERD: she states she changed her diet , packing her lunch and not having indigestion. Controlled with life style modifications and has not taken PPI in a long time    Post-surgical hypothyroidism: she states she has been taking medication every morning, Last TSH  was above goal, she is only taking one daily and one extra pill once a week but she forgets to take the extra pills at times, discussed pill box    Patient Active Problem List   Diagnosis Date Noted   Stage 3a chronic kidney disease (HCC) 06/03/2022   Gastroesophageal reflux disease without esophagitis 07/03/2021   Anxiety 07/03/2021   GAD (generalized anxiety disorder) 07/03/2021   Dysthymia 07/03/2021   History of thyroidectomy 08/05/2017   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 of myomectomy and CHTN  09/28/2016   Tenosynovitis of wrist 05/21/2016   Morbid obesity with BMI of 40.0-44.9, adult (HCC) 03/31/2016   H/O myomectomy 03/11/2015   Essential hypertension 11/28/2014   Hypertrichosis 07/24/2014   Vitamin D deficiency 07/24/2014   PCOS (polycystic ovarian syndrome) 07/24/2014  History of abnormal cervical Pap smear 11/24/2011   Genital herpes 11/29/2008    Past Surgical History:  Procedure Laterality Date   BIOPSY THYROID  07/2015   Dr. Tedd Sias   CESAREAN SECTION N/A 09/28/2016   Procedure: Primary CESAREAN SECTION;  Surgeon: Maxie Better, MD;  Location: [facility];  Service: Obstetrics;  Laterality: N/A;  EDD: 10/19/16 Allergy: Hydrocodone-Acetaminophen   LAPAROSCOPIC GELPORT ASSISTED MYOMECTOMY N/A 03/11/2015   Procedure: LAPAROSCOPIC  MYOMECTOMY--attempted;  Surgeon: Hildred Laser, MD;  Location: [facility] ORS;  Service: Gynecology;  Laterality: N/A;   LAPAROTOMY  N/A 03/11/2015   Procedure: LAPAROTOMY--MYOMECTOMY;  Surgeon: Hildred Laser, MD;  Location: [facility] ORS;  Service: Gynecology;  Laterality: N/A;   MOUTH SURGERY  1996   TOTAL THYROIDECTOMY Bilateral 03/05/2017   Dr. Selena Batten [facility]    Family History  Problem Relation Age of Onset   Thyroid disease Mother    Hypertension Mother    Cancer Maternal Aunt    Cancer Maternal Grandfather    Prostate cancer Maternal Grandfather    Stroke Maternal Grandmother    Breast cancer Paternal Grandmother    Heart failure Paternal Grandfather    Liver disease Brother        Fatty Liver    Social History   Tobacco Use   Smoking status: Never   Smokeless tobacco: Never  Substance Use Topics   Alcohol use: Yes    Comment: occasionally, about once a month     Current Outpatient Medications:    docusate sodium (COLACE) 100 MG capsule, Take 1 capsule (100 mg total) by mouth 2 (two) times daily., Disp: 30 capsule, Rfl: 0   escitalopram (LEXAPRO) 10 MG tablet, TAKE 1 TABLET BY MOUTH EVERY DAY, Disp: 30 tablet, Rfl: 0   levothyroxine (SYNTHROID) 200 MCG tablet, TAKE 1 TABLET (200 MCG TOTAL) BY MOUTH DAILY BEFORE BREAKFAST. TAKE AN EXTRA HALF PILL ONCE A WEEK, Disp: 96 tablet, Rfl: 0   triamterene-hydrochlorothiazide (DYAZIDE) 37.5-25 MG capsule, TAKE 1 EACH (1 CAPSULE TOTAL) BY MOUTH DAILY., Disp: 30 capsule, Rfl: 0   valACYclovir (VALTREX) 500 MG tablet, TAKE 1 TABLET BY MOUTH 2 TIMES DAILY FOR OUTBREAKS AND ONCE DAILY FOR PREVENTION AS DIRECTED, Disp: 60 tablet, Rfl: 3   Vitamin D, Ergocalciferol, (DRISDOL) 1.25 MG (50000 UNIT) CAPS capsule, Take 1 capsule (50,000 Units total) by mouth every 7 (seven) days., Disp: 12 capsule, Rfl: 1   fluticasone (FLONASE) 50 MCG/ACT nasal spray, Place 2 sprays into both nostrils daily. (Patient not taking: Reported on 06/03/2022), Disp: 16 g, Rfl: 0  Allergies  Allergen Reactions   Hydrocodone Itching    I personally reviewed active problem list, medication list,  allergies, family history, social history, health maintenance with the patient/caregiver today.   ROS  Constitutional: Negative for fever, positive for  weight change.  Respiratory: Negative for cough and shortness of breath.   Cardiovascular: Negative for chest pain or palpitations.  Gastrointestinal: Negative for abdominal pain, no bowel changes.  Musculoskeletal: Negative for gait problem or joint swelling.  Skin: Negative for rash.  Neurological: Negative for dizziness or headache.  No other specific complaints in a complete review of systems (except as listed in HPI above).   Objective  Vitals:   09/09/22 1453  BP: 122/74  Pulse: 97  Resp: 16  Temp: 97.9 F (36.6 C)  TempSrc: Oral  SpO2: 97%  Weight: (!) 373 lb 14.4 oz (169.6 kg)  Height: 5\' 10"  (1.778 m)    Body mass index is 53.65  kg/m.  Physical Exam  Constitutional: Patient appears well-developed and well-nourished. Obese  No distress.  HEENT: head atraumatic, normocephalic, pupils equal and reactive to light, neck supple Cardiovascular: Normal rate, regular rhythm and normal heart sounds.  No murmur heard. No BLE edema. Pulmonary/Chest: Effort normal and breath sounds normal. No respiratory distress. Abdominal: Soft.  There is no tenderness. Psychiatric: Patient has a normal mood and affect. behavior is normal. Judgment and thought content normal.   PHQ2/9:    09/09/2022    2:57 PM 06/03/2022    2:00 PM 02/24/2022    2:51 PM 02/03/2022    7:56 AM 01/05/2022    2:03 PM  Depression screen PHQ 2/9  Decreased Interest 0 0 0 0 0  Down, Depressed, Hopeless 0 0 0 0 0  PHQ - 2 Score 0 0 0 0 0  Altered sleeping 0 0 0 0 0  Tired, decreased energy 0 2 0 0 0  Change in appetite 0 0 0 0 0  Feeling bad or failure about yourself  0 0 0 0 0  Trouble concentrating 0 0 0 0 0  Moving slowly or fidgety/restless 0 0 0 0 0  Suicidal thoughts 0 0 0 0 0  PHQ-9 Score 0 2 0 0 0    phq 9 is negative   Fall Risk:     09/09/2022    2:57 PM 06/03/2022    2:00 PM 02/24/2022    2:51 PM 02/03/2022    7:56 AM 01/05/2022    2:03 PM  Fall Risk   Falls in the past year? 1 1 0 1 0  Number falls in past yr: 0 0  0 0  Injury with Fall? 0 0  0 0  Risk for fall due to : History of fall(s) No Fall Risks No Fall Risks No Fall Risks No Fall Risks  Follow up Education provided;Falls evaluation completed Falls prevention discussed Falls prevention discussed Falls prevention discussed Falls prevention discussed      Functional Status Survey: Is the patient deaf or have difficulty hearing?: No Does the patient have difficulty seeing, even when wearing glasses/contacts?: No Does the patient have difficulty concentrating, remembering, or making decisions?: No Does the patient have difficulty walking or climbing stairs?: Yes Does the patient have difficulty dressing or bathing?: No Does the patient have difficulty doing errands alone such as visiting a doctor's office or shopping?: No    Assessment & Plan   1. Morbid obesity with BMI of 40.0-44.9, adult [facility])  Discussed with the patient the risk posed by an increased BMI. Discussed importance of portion control, calorie counting and at least 150 minutes of physical activity weekly. Avoid sweet beverages and drink more water. Eat at least 6 servings of fruit and vegetables daily    2. Stage 3a chronic kidney disease (HCC)  We will continue to monitor   3. OSA (obstructive sleep apnea)  Wearing CPAP most nights  4. Essential hypertension  - triamterene-hydrochlorothiazide (DYAZIDE) 37.5-25 MG capsule; Take 1 each (1 capsule total) by mouth daily.  Dispense: 90 capsule; Refill: 1  5. Post-surgical hypothyroidism  Taking levothyroxine as prescribed and last level was at goal   6. Iron deficiency anemia due to chronic blood loss  Only taking iron when her cycles are on   7. Menorrhagia with regular cycle  She would like to see Dr. Valentino Saxon to discuss options  to decrease her cycle , history of myomectomy   8. GAD (generalized anxiety disorder)  -  escitalopram (LEXAPRO) 10 MG tablet; Take 1 tablet (10 mg total) by mouth daily.  Dispense: 90 tablet; Refill: 1  9. Essential hypertension  - triamterene-hydrochlorothiazide (DYAZIDE) 37.5-25 MG capsule; Take 1 each (1 capsule total) by mouth daily.  Dispense: 90 capsule; Refill: 1  10. Dysthymia  - escitalopram (LEXAPRO) 10 MG tablet; Take 1 tablet (10 mg total) by mouth daily.  Dispense: 90 tablet; Refill: 1  12. Genital herpes simplex, unspecified site  - valACYclovir (VALTREX) 500 MG tablet; TAKE 1 TABLET BY MOUTH 2 TIMES DAILY FOR OUTBREAKS AND ONCE DAILY FOR PREVENTION AS DIRECTED  Dispense: 30 tablet; Refill: 2

## 2022-09-09 ENCOUNTER — Ambulatory Visit: Payer: 59 | Admitting: Family Medicine

## 2022-09-09 ENCOUNTER — Encounter: Payer: Self-pay | Admitting: Family Medicine

## 2022-09-09 VITALS — BP 122/74 | HR 97 | Temp 97.9°F | Resp 16 | Ht 70.0 in | Wt 373.9 lb

## 2022-09-09 DIAGNOSIS — G4733 Obstructive sleep apnea (adult) (pediatric): Secondary | ICD-10-CM | POA: Diagnosis not present

## 2022-09-09 DIAGNOSIS — A6000 Herpesviral infection of urogenital system, unspecified: Secondary | ICD-10-CM

## 2022-09-09 DIAGNOSIS — D500 Iron deficiency anemia secondary to blood loss (chronic): Secondary | ICD-10-CM

## 2022-09-09 DIAGNOSIS — I10 Essential (primary) hypertension: Secondary | ICD-10-CM | POA: Diagnosis not present

## 2022-09-09 DIAGNOSIS — E890 Postprocedural hypothyroidism: Secondary | ICD-10-CM

## 2022-09-09 DIAGNOSIS — N920 Excessive and frequent menstruation with regular cycle: Secondary | ICD-10-CM

## 2022-09-09 DIAGNOSIS — F341 Dysthymic disorder: Secondary | ICD-10-CM

## 2022-09-09 DIAGNOSIS — Z6841 Body Mass Index (BMI) 40.0 and over, adult: Secondary | ICD-10-CM

## 2022-09-09 DIAGNOSIS — F411 Generalized anxiety disorder: Secondary | ICD-10-CM

## 2022-09-09 DIAGNOSIS — N1831 Chronic kidney disease, stage 3a: Secondary | ICD-10-CM | POA: Diagnosis not present

## 2022-09-09 MED ORDER — VALACYCLOVIR HCL 500 MG PO TABS: ORAL_TABLET | ORAL | 2 refills | Status: DC

## 2022-09-09 MED ORDER — LEVOTHYROXINE SODIUM 200 MCG PO TABS: 200.0000 ug | ORAL_TABLET | Freq: Every day | ORAL | 1 refills | Status: DC

## 2022-09-09 MED ORDER — ESCITALOPRAM OXALATE 10 MG PO TABS: 10.0000 mg | ORAL_TABLET | Freq: Every day | ORAL | 1 refills | Status: DC

## 2022-09-09 MED ORDER — TRIAMTERENE-HCTZ 37.5-25 MG PO CAPS
1.0000 | ORAL_CAPSULE | Freq: Every day | ORAL | 1 refills | Status: DC
Start: 2022-09-09 — End: 2023-04-05

## 2022-09-10 ENCOUNTER — Telehealth: Payer: Self-pay | Admitting: Family Medicine

## 2022-09-10 NOTE — Telephone Encounter (Unsigned)
Copied from CRM [id number]. Topic: General - Inquiry >> Sep 10, 2022 10:43 AM Patsy Lager T wrote: Reason for CRM: Sharita from Dr Cherly Hensen office called stated provider does not want to continue with trying to establish patient as she was a no call no show for her first appt.

## 2022-09-10 NOTE — Telephone Encounter (Signed)
error 

## 2022-09-11 ENCOUNTER — Other Ambulatory Visit: Payer: Self-pay | Admitting: Family Medicine

## 2022-09-11 DIAGNOSIS — D500 Iron deficiency anemia secondary to blood loss (chronic): Secondary | ICD-10-CM

## 2022-09-11 DIAGNOSIS — N920 Excessive and frequent menstruation with regular cycle: Secondary | ICD-10-CM

## 2022-09-23 ENCOUNTER — Other Ambulatory Visit: Payer: Self-pay | Admitting: Family Medicine

## 2022-09-23 DIAGNOSIS — A6000 Herpesviral infection of urogenital system, unspecified: Secondary | ICD-10-CM

## 2022-10-01 NOTE — Progress Notes (Signed)
Name: Michelle Ware   MRN: [id number]    DOB: [birth year]   Date:10/02/2022       Progress Note  Subjective  Chief Complaint  Annual Exam  HPI  Patient presents for annual CPE.  Diet: cooking more at home and eating more salads  Exercise: walking every other day  -1 mile  Last Eye Exam: she is due for an exam  Last Dental Exam: up to date   [employer] Visit from 10/02/2022 in [facility]  AUDIT-C Score 1      Depression: Phq 9 is  negative    10/02/2022   10:27 AM 09/09/2022    2:57 PM 06/03/2022    2:00 PM 02/24/2022    2:51 PM 02/03/2022    7:56 AM  Depression screen PHQ 2/9  Decreased Interest 0 0 0 0 0  Down, Depressed, Hopeless 0 0 0 0 0  PHQ - 2 Score 0 0 0 0 0  Altered sleeping 0 0 0 0 0  Tired, decreased energy 0 0 2 0 0  Change in appetite 0 0 0 0 0  Feeling bad or failure about yourself  0 0 0 0 0  Trouble concentrating 0 0 0 0 0  Moving slowly or fidgety/restless 0 0 0 0 0  Suicidal thoughts 0 0 0 0 0  PHQ-9 Score 0 0 2 0 0  Difficult doing work/chores Not difficult at all       Hypertension: BP Readings from Last 3 Encounters:  10/02/22 136/74  09/09/22 122/74  06/03/22 122/76   Obesity: Wt Readings from Last 3 Encounters:  10/02/22 (!) 371 lb 9.6 oz (168.6 kg)  09/09/22 (!) 373 lb 14.4 oz (169.6 kg)  06/03/22 (!) 367 lb (166.5 kg)   BMI Readings from Last 3 Encounters:  10/02/22 53.32 kg/m  09/09/22 53.65 kg/m  06/03/22 52.66 kg/m     Vaccines:    Tdap: up to date Shingrix: N/A Pneumonia: discussed with patient and she will have it in October  Flu: Due for it, she will return in October  COVID-19: up to date   Hep C Screening: 12/27/17 STD testing and prevention (HIV/chl/gon/syphilis): 09/28/16 Intimate partner violence: negative screen  Sexual History : not sexually active in over 5 years  Menstrual History/LMP/Abnormal Bleeding: regular cycles, heavy, taking iron during her cycles , waiting to see  gyn for possible ablation  Discussed importance of follow up if any post-menopausal bleeding: N/A Incontinence Symptoms: mild when she has a bad cough , discussed kegel exercises   Breast cancer:  - Last Mammogram: 10/01/21 - BRCA gene screening: N/A  Osteoporosis Prevention : Discussed high calcium and vitamin D supplementation, weight bearing exercises Bone density: N/A  Cervical cancer screening: 09/19/20  Skin cancer: Discussed monitoring for atypical lesions  Colorectal cancer:  start at age 52  Lung cancer:  Low Dose CT Chest recommended if Age 48-80 years, 20 pack-year currently smoking OR have quit w/in 15years. Patient does not qualify for screen   ECG: 10/31/20  Advanced Care Planning: A voluntary discussion about advance care planning including the explanation and discussion of advance directives.  Discussed health care proxy and Living will, and the patient was able to identify a health care proxy as parents .  Patient does not have a living will and power of attorney of health care   Lipids: Lab Results  Component Value Date   CHOL 137 09/24/2021   CHOL 136 09/19/2020   CHOL  131 12/22/2019   Lab Results  Component Value Date   HDL 48 (L) 09/24/2021   HDL 51 09/19/2020   HDL 56 12/22/2019   Lab Results  Component Value Date   LDLCALC 74 09/24/2021   LDLCALC 71 09/19/2020   LDLCALC 60 12/22/2019   Lab Results  Component Value Date   TRIG 66 09/24/2021   TRIG 60 09/19/2020   TRIG 70 12/22/2019   Lab Results  Component Value Date   CHOLHDL 2.9 09/24/2021   CHOLHDL 2.7 09/19/2020   CHOLHDL 2.3 12/22/2019   No results found for: "LDLDIRECT"  Glucose: Glucose, Bld  Date Value Ref Range Status  06/03/2022 98 65 - 99 mg/dL Final    Comment:    .            Fasting reference interval .   09/24/2021 97 65 - 99 mg/dL Final    Comment:    .            Fasting reference interval .   09/19/2020 95 65 - 99 mg/dL Final    Comment:    .             Fasting reference interval .     Patient Active Problem List   Diagnosis Date Noted   Stage 3a chronic kidney disease (HCC) 06/03/2022   Gastroesophageal reflux disease without esophagitis 07/03/2021   Anxiety 07/03/2021   GAD (generalized anxiety disorder) 07/03/2021   Dysthymia 07/03/2021   History of thyroidectomy 08/05/2017   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 of myomectomy and CHTN  09/28/2016   Tenosynovitis of wrist 05/21/2016   Morbid obesity with BMI of 40.0-44.9, adult (HCC) 03/31/2016   H/O myomectomy 03/11/2015   Essential hypertension 11/28/2014   Hypertrichosis 07/24/2014   Vitamin D deficiency 07/24/2014   PCOS (polycystic ovarian syndrome) 07/24/2014   History of abnormal cervical Pap smear 11/24/2011   Genital herpes 11/29/2008    Past Surgical History:  Procedure Laterality Date   BIOPSY THYROID  07/2015   Dr. Tedd Sias   CESAREAN SECTION N/A 09/28/2016   Procedure: Primary CESAREAN SECTION;  Surgeon: Maxie Better, MD;  Location: [facility];  Service: Obstetrics;  Laterality: N/A;  EDD: 10/19/16 Allergy: Hydrocodone-Acetaminophen   LAPAROSCOPIC GELPORT ASSISTED MYOMECTOMY N/A 03/11/2015   Procedure: LAPAROSCOPIC  MYOMECTOMY--attempted;  Surgeon: Hildred Laser, MD;  Location: [facility] ORS;  Service: Gynecology;  Laterality: N/A;   LAPAROTOMY N/A 03/11/2015   Procedure: LAPAROTOMY--MYOMECTOMY;  Surgeon: Hildred Laser, MD;  Location: [facility] ORS;  Service: Gynecology;  Laterality: N/A;   MOUTH SURGERY  1996   TOTAL THYROIDECTOMY Bilateral 03/05/2017   Dr. Selena Batten [facility]    Family History  Problem Relation Age of Onset   Thyroid disease Mother    Hypertension Mother    Cancer Maternal Aunt    Cancer Maternal Grandfather    Prostate cancer Maternal Grandfather    Stroke Maternal Grandmother    Breast cancer Paternal Grandmother    Heart failure Paternal Grandfather    Liver disease Brother        Fatty Liver     Social History   Socioeconomic History   Marital status: Single    Spouse name: Not on file   Number of children: 1   Years of education: Not on file   Highest education level: Bachelor's degree (e.g., BA, AB, BS)  Occupational History   Occupation: Location manager    Comment: works for [employer] in [employer]  Tobacco Use   Smoking status: Never   Smokeless tobacco: Never  Vaping Use   Vaping status: Never Used  Substance and Sexual Activity   Alcohol use: Yes    Comment: occasionally, about once a month   Drug use: No   Sexual activity: Not Currently    Partners: Male    Birth control/protection: Condom  Other Topics Concern   Not on file  Social History Narrative   Lives by herself, broke up with Channing Mutters, but they had a son 09/28/2016 and he is involved in their son's life   Working full time at [employer] ( cigarette company), works at [employer] side.   Social Determinants of Health   Financial Resource Strain: Low Risk  (10/02/2022)   Overall Financial Resource Strain (CARDIA)    Difficulty of Paying Living Expenses: Not hard at all  Food Insecurity: No Food Insecurity (10/02/2022)   Hunger Vital Sign    Worried About Running Out of Food in the Last Year: Never true    Ran Out of Food in the Last Year: Never true  Transportation Needs: No Transportation Needs (10/02/2022)   PRAPARE - Administrator, Civil Service (Medical): No    Lack of Transportation (Non-Medical): No  Physical Activity: Insufficiently Active (10/02/2022)   Exercise Vital Sign    Days of Exercise per Week: 4 days    Minutes of Exercise per Session: 20 min  Stress: No Stress Concern Present (10/02/2022)   [employer] of Occupational Health - Occupational Stress Questionnaire    Feeling of Stress : Not at all  Social Connections: Moderately Isolated (10/02/2022)   Social Connection and Isolation Panel [NHANES]    Frequency of Communication with Friends and Family: More than three  times a week    Frequency of Social Gatherings with Friends and Family: More than three times a week    Attends Religious Services: More than 4 times per year    Active Member of [employer] or Organizations: No    Attends Banker Meetings: Never    Marital Status: Never married  Intimate Partner Violence: Not At Risk (10/02/2022)   Humiliation, Afraid, Rape, and Kick questionnaire    Fear of Current or Ex-Partner: No    Emotionally Abused: No    Physically Abused: No    Sexually Abused: No     Current Outpatient Medications:    Cholecalciferol (VITAMIN D3) 50 MCG (2000 UT) CAPS, Take 1 capsule by mouth daily at 12 noon., Disp: , Rfl:    docusate sodium (COLACE) 100 MG capsule, Take 1 capsule (100 mg total) by mouth 2 (two) times daily., Disp: 30 capsule, Rfl: 0   escitalopram (LEXAPRO) 10 MG tablet, Take 1 tablet (10 mg total) by mouth daily., Disp: 90 tablet, Rfl: 1   levothyroxine (SYNTHROID) 200 MCG tablet, Take 1 tablet (200 mcg total) by mouth daily before breakfast. Take an extra half pill once a week, Disp: 96 tablet, Rfl: 1   triamterene-hydrochlorothiazide (DYAZIDE) 37.5-25 MG capsule, Take 1 each (1 capsule total) by mouth daily., Disp: 90 capsule, Rfl: 1   valACYclovir (VALTREX) 500 MG tablet, TAKE 1 TABLET BY MOUTH 2 TIMES DAILY FOR OUTBREAKS AND ONCE DAILY FOR PREVENTION AS DIRECTED, Disp: 100 tablet, Rfl: 1   fluticasone (FLONASE) 50 MCG/ACT nasal spray, Place 2 sprays into both nostrils daily. (Patient not taking: Reported on 06/03/2022), Disp: 16 g, Rfl: 0  Allergies  Allergen Reactions   Hydrocodone Itching  ROS  Constitutional: Negative for fever or weight change.  Respiratory: Negative for cough and shortness of breath.   Cardiovascular: Negative for chest pain or palpitations.  Gastrointestinal: Negative for abdominal pain, no bowel changes.  Musculoskeletal: Negative for gait problem or joint swelling.  Skin: Negative for rash.  Neurological:  Negative for dizziness or headache.  No other specific complaints in a complete review of systems (except as listed in HPI above).   Objective  Vitals:   10/02/22 1027  BP: 136/74  Pulse: 92  Resp: 16  Temp: 97.8 F (36.6 C)  TempSrc: Oral  SpO2: 98%  Weight: (!) 371 lb 9.6 oz (168.6 kg)  Height: 5\' 10"  (1.778 m)    Body mass index is 53.32 kg/m.  Physical Exam  Constitutional: Patient appears well-developed and well-nourished. No distress.  HENT: Head: Normocephalic and atraumatic. Ears: B TMs ok, no erythema or effusion; Nose: Nose normal. Mouth/Throat: Oropharynx is clear and moist. No oropharyngeal exudate.  Eyes: Conjunctivae and EOM are normal. Pupils are equal, round, and reactive to light. No scleral icterus.  Neck: Normal range of motion. Neck supple. No JVD present. No thyromegaly present.  Cardiovascular: Normal rate, regular rhythm and normal heart sounds.  No murmur heard. No BLE edema. Pulmonary/Chest: Effort normal and breath sounds normal. No respiratory distress. Abdominal: Soft. Bowel sounds are normal, no distension. There is no tenderness. no masses Breast: no lumps or masses, no nipple discharge or rashes FEMALE GENITALIA:  Not done  RECTAL: not done  Musculoskeletal: slow gait , mild genu varum  Neurological: he is alert and oriented to person, place, and time. No cranial nerve deficit. Coordination, balance, strength, speech and gait are normal.  Skin: Skin is warm and dry. No rash noted. No erythema.  Psychiatric: Patient has a normal mood and affect. behavior is normal. Judgment and thought content normal.    Fall Risk:    10/02/2022   10:27 AM 09/09/2022    2:57 PM 06/03/2022    2:00 PM 02/24/2022    2:51 PM 02/03/2022    7:56 AM  Fall Risk   Falls in the past year? 1 1 1  0 1  Number falls in past yr: 0 0 0  0  Injury with Fall? 1 0 0  0  Risk for fall due to : Impaired balance/gait History of fall(s) No Fall Risks No Fall Risks No Fall Risks   Follow up Falls prevention discussed;Education provided;Falls evaluation completed Education provided;Falls evaluation completed Falls prevention discussed Falls prevention discussed Falls prevention discussed     Functional Status Survey: Is the patient deaf or have difficulty hearing?: No Does the patient have difficulty seeing, even when wearing glasses/contacts?: No Does the patient have difficulty concentrating, remembering, or making decisions?: No Does the patient have difficulty walking or climbing stairs?: Yes Does the patient have difficulty dressing or bathing?: No Does the patient have difficulty doing errands alone such as visiting a doctor's office or shopping?: No   Assessment & Plan  1. Well adult exam  - Lipid panel - CBC with Differential/Platelet - COMPLETE METABOLIC PANEL WITH GFR - TSH - Hemoglobin A1c - VITAMIN D 25 Hydroxy (Vit-D Deficiency, Fractures) - B12 and Folate Panel  2. Lipid screening  - Lipid panel  3. Vitamin D deficiency  - VITAMIN D 25 Hydroxy (Vit-D Deficiency, Fractures)  4. Prediabetes  - Hemoglobin A1c  5. Iron deficiency anemia due to chronic blood loss  - CBC with Differential/Platelet - B12 and Folate  Panel - Iron, TIBC and Ferritin Panel  6. Long-term use of high-risk medication  - COMPLETE METABOLIC PANEL WITH GFR  7. Post-surgical hypothyroidism  - TSH  8. Breast cancer screening by mammogram  - MM 3D SCREENING MAMMOGRAM BILATERAL BREAST; Future  9. Colon cancer screening  - Ambulatory referral to Gastroenterology    -USPSTF grade A and B recommendations reviewed with patient; age-appropriate recommendations, preventive care, screening tests, etc discussed and encouraged; healthy living encouraged; see AVS for patient education given to patient -Discussed importance of 150 minutes of physical activity weekly, eat two servings of fish weekly, eat one serving of tree nuts ( cashews, pistachios, pecans,  almonds.Marland Kitchen) every other day, eat 6 servings of fruit/vegetables daily and drink plenty of water and avoid sweet beverages.   -Reviewed Health Maintenance: Yes.

## 2022-10-02 ENCOUNTER — Encounter: Payer: Self-pay | Admitting: Family Medicine

## 2022-10-02 ENCOUNTER — Ambulatory Visit (INDEPENDENT_AMBULATORY_CARE_PROVIDER_SITE_OTHER): Payer: 59 | Admitting: Family Medicine

## 2022-10-02 VITALS — BP 136/74 | HR 92 | Temp 97.8°F | Resp 16 | Ht 70.0 in | Wt 371.6 lb

## 2022-10-02 DIAGNOSIS — D500 Iron deficiency anemia secondary to blood loss (chronic): Secondary | ICD-10-CM

## 2022-10-02 DIAGNOSIS — Z0000 Encounter for general adult medical examination without abnormal findings: Secondary | ICD-10-CM

## 2022-10-02 DIAGNOSIS — Z79899 Other long term (current) drug therapy: Secondary | ICD-10-CM

## 2022-10-02 DIAGNOSIS — Z1231 Encounter for screening mammogram for malignant neoplasm of breast: Secondary | ICD-10-CM

## 2022-10-02 DIAGNOSIS — E559 Vitamin D deficiency, unspecified: Secondary | ICD-10-CM | POA: Diagnosis not present

## 2022-10-02 DIAGNOSIS — Z13220 Encounter for screening for lipoid disorders: Secondary | ICD-10-CM | POA: Diagnosis not present

## 2022-10-02 DIAGNOSIS — E890 Postprocedural hypothyroidism: Secondary | ICD-10-CM

## 2022-10-02 DIAGNOSIS — R7303 Prediabetes: Secondary | ICD-10-CM

## 2022-10-02 DIAGNOSIS — Z1211 Encounter for screening for malignant neoplasm of colon: Secondary | ICD-10-CM

## 2022-10-03 LAB — LIPID PANEL
Cholesterol: 139 mg/dL (ref <200)
HDL: 53 mg/dL (ref >=50)
LDL Cholesterol (Calc): 70 mg/dL
Non-HDL Cholesterol (Calc): 86 mg/dL (ref <130)
Total CHOL/HDL Ratio: 2.6 (calc) (ref <5.0)
Triglycerides: 80 mg/dL (ref <150)

## 2022-10-03 LAB — COMPLETE METABOLIC PANEL WITH GFR
AG Ratio: 1.5 (calc) (ref 1.0–2.5)
ALT: 28 U/L (ref 6–29)
AST: 34 U/L — ABNORMAL HIGH (ref 10–30)
Albumin: 4.4 g/dL (ref 3.6–5.1)
Alkaline phosphatase (APISO): 53 U/L (ref 31–125)
BUN/Creatinine Ratio: 14 (calc) (ref 6–22)
BUN: 15 mg/dL (ref 7–25)
CO2: 28 mmol/L (ref 20–32)
Calcium: 8.9 mg/dL (ref 8.6–10.2)
Chloride: 104 mmol/L (ref 98–110)
Creat: 1.09 mg/dL — ABNORMAL HIGH (ref 0.50–0.99)
Globulin: 3 g/dL (ref 1.9–3.7)
Glucose, Bld: 79 mg/dL (ref 65–99)
Potassium: 3.9 mmol/L (ref 3.5–5.3)
Sodium: 140 mmol/L (ref 135–146)
Total Bilirubin: 0.4 mg/dL (ref 0.2–1.2)
Total Protein: 7.4 g/dL (ref 6.1–8.1)
eGFR: 64 mL/min/{1.73_m2} (ref >=60)

## 2022-10-03 LAB — CBC WITH DIFFERENTIAL/PLATELET
Absolute Monocytes: 639 {cells}/uL (ref 200–950)
Basophils Absolute: 46 {cells}/uL (ref 0–200)
Basophils Relative: 0.6 %
Eosinophils Absolute: 208 {cells}/uL (ref 15–500)
Eosinophils Relative: 2.7 %
HCT: 33 % — ABNORMAL LOW (ref 35.0–45.0)
Hemoglobin: 10.2 g/dL — ABNORMAL LOW (ref 11.7–15.5)
Lymphs Abs: 2749 {cells}/uL (ref 850–3900)
MCH: 23.4 pg — ABNORMAL LOW (ref 27.0–33.0)
MCHC: 30.9 g/dL — ABNORMAL LOW (ref 32.0–36.0)
MCV: 75.7 fL — ABNORMAL LOW (ref 80.0–100.0)
MPV: 10.2 fL (ref 7.5–12.5)
Monocytes Relative: 8.3 %
Neutro Abs: 4058 {cells}/uL (ref 1500–7800)
Neutrophils Relative %: 52.7 %
Platelets: 274 10*3/uL (ref 140–400)
RBC: 4.36 10*6/uL (ref 3.80–5.10)
RDW: 15.7 % — ABNORMAL HIGH (ref 11.0–15.0)
Total Lymphocyte: 35.7 %
WBC: 7.7 10*3/uL (ref 3.8–10.8)

## 2022-10-03 LAB — IRON,TIBC AND FERRITIN PANEL
%SAT: 16 % (ref 16–45)
Ferritin: 13 ng/mL — ABNORMAL LOW (ref 16–232)
Iron: 68 ug/dL (ref 40–190)
TIBC: 434 ug/dL (ref 250–450)

## 2022-10-03 LAB — HEMOGLOBIN A1C
Hgb A1c MFr Bld: 7.2 %{Hb} — ABNORMAL HIGH (ref <5.7)
Mean Plasma Glucose: 160 mg/dL
eAG (mmol/L): 8.9 mmol/L

## 2022-10-03 LAB — VITAMIN D 25 HYDROXY (VIT D DEFICIENCY, FRACTURES): Vit D, 25-Hydroxy: 21 ng/mL — ABNORMAL LOW (ref 30–100)

## 2022-10-03 LAB — B12 AND FOLATE PANEL
Folate: 6.7 ng/mL
Vitamin B-12: 415 pg/mL (ref 200–1100)

## 2022-10-03 LAB — TSH: TSH: 6.88 m[IU]/L — ABNORMAL HIGH

## 2022-10-05 ENCOUNTER — Telehealth: Payer: Self-pay

## 2022-10-05 ENCOUNTER — Telehealth: Payer: Self-pay | Admitting: Family Medicine

## 2022-10-05 NOTE — Telephone Encounter (Signed)
Pt called to f/u on her labs.  Please advise.

## 2022-10-05 NOTE — Telephone Encounter (Signed)
Patient called for lab results. Shared provider's note. Pt will p/u vitamin D at pharmacy. Appt made for this weds.  Alba Cory, MD 10/05/2022  1:22 PM EDT     Please make sure appointment is schedule to discuss thyroid dose adjustment and new onset DM   Anemia is stable TSH is up and we need to adjust dose A1C is in diabetes range now, you need to return sooner to discuss medication Vitamin D level is low, please take 2000 units of vitamin D daily

## 2022-10-05 NOTE — Telephone Encounter (Signed)
Returned patient's call for lab results. No answer, VM full unable to leave message.

## 2022-10-07 ENCOUNTER — Encounter: Payer: Self-pay | Admitting: Nurse Practitioner

## 2022-10-07 ENCOUNTER — Other Ambulatory Visit: Payer: Self-pay

## 2022-10-07 ENCOUNTER — Ambulatory Visit: Payer: 59 | Admitting: Nurse Practitioner

## 2022-10-07 ENCOUNTER — Ambulatory Visit: Payer: 59 | Admitting: Family Medicine

## 2022-10-07 VITALS — BP 128/72 | HR 94 | Temp 98.7°F | Resp 18 | Ht 70.0 in | Wt 377.2 lb

## 2022-10-07 DIAGNOSIS — E890 Postprocedural hypothyroidism: Secondary | ICD-10-CM

## 2022-10-07 DIAGNOSIS — Z6841 Body Mass Index (BMI) 40.0 and over, adult: Secondary | ICD-10-CM | POA: Diagnosis not present

## 2022-10-07 DIAGNOSIS — E119 Type 2 diabetes mellitus without complications: Secondary | ICD-10-CM

## 2022-10-07 DIAGNOSIS — E1129 Type 2 diabetes mellitus with other diabetic kidney complication: Secondary | ICD-10-CM | POA: Insufficient documentation

## 2022-10-07 MED ORDER — OZEMPIC (0.25 OR 0.5 MG/DOSE) 2 MG/3ML ~~LOC~~ SOPN
0.5000 mg | PEN_INJECTOR | SUBCUTANEOUS | 0 refills | Status: DC
Start: 2022-10-07 — End: 2022-12-03

## 2022-10-07 MED ORDER — BLOOD GLUCOSE TEST VI STRP
1.0000 | ORAL_STRIP | Freq: Three times a day (TID) | 0 refills | Status: AC
Start: 2022-10-07 — End: 2022-11-06

## 2022-10-07 MED ORDER — BLOOD GLUCOSE MONITORING SUPPL DEVI
1.0000 | Freq: Three times a day (TID) | 0 refills | Status: AC
Start: 2022-10-07 — End: ?

## 2022-10-07 MED ORDER — LANCETS MISC. MISC
1.0000 | Freq: Three times a day (TID) | 0 refills | Status: AC
Start: 2022-10-07 — End: 2022-11-06

## 2022-10-07 MED ORDER — LANCET DEVICE MISC: 1.0000 | Freq: Three times a day (TID) | 0 refills | Status: AC

## 2022-10-07 MED ORDER — INSULIN PEN NEEDLE 32G X 6 MM MISC
1.0000 | 1 refills | Status: DC
Start: 2022-10-07 — End: 2022-12-03

## 2022-10-07 NOTE — Progress Notes (Signed)
BP 128/72   Pulse 94   Temp 98.7 F (37.1 C) (Oral)   Resp 18   Ht 5\' 10"  (1.778 m)   Wt (!) 377 lb 3.2 oz (171.1 kg)   LMP 09/06/2022 (Approximate)   SpO2 97%   BMI 54.12 kg/m    Subjective:    Patient ID: Michelle Ware, female    DOB: Oct 14, 1980, 42 y.o.   MRN: [id number]  HPI: Michelle Ware is a 42 y.o. female  Chief Complaint  Patient presents with   Diabetes    Follow up   Thyroid Problem   New onset Diabetes, Type 2:  -Last A1c 7.2 -Medications: none -Checking BG at home: not yet will send in order for glucometer -Diet: try and reduce sugar and processed foods in your diet, eating a lower carbohydrate diet can help too -Exercise: recommend 150 min of physical activity weekly   -Eye exam: needs to schedule -Foot exam: due -Microalbumin: due -Statin: no -Denies symptoms of hypoglycemia, polyuria, polydipsia, numbness extremities, foot ulcers/trauma.  -discussed disease process, and health maintenance -discussed options, will try and get approved for GLP1 medication. Sample provided start at 0.25 mg weekly increase to 0.5 mg weekly if tolerating.    Hypothyroidism: last TSH was elevated at 6.88. she is currently taking levothyroxine 200 mcg daily and an additional 1/2 tab once a week. Discussed administration that patient is taking it first thing in the morning on an empty stomach.  She reports she has not been consistent with taking the additional 1/2 tab once a week. She is going to get a drug box and set her pills up so she will remember and be more consistent.     Obesity:  Current weight : 377 lbs BMI: 54.12 Treatment Tried: lifestyle modification, ozempic Comorbidities: diabetes, hypothyroidism   Relevant past medical, surgical, family and social history reviewed and updated as indicated. Interim medical history since our last visit reviewed. Allergies and medications reviewed and updated.  Review of Systems  Constitutional: Negative for fever or weight  change.  Respiratory: Negative for cough and shortness of breath.   Cardiovascular: Negative for chest pain or palpitations.  Gastrointestinal: Negative for abdominal pain, no bowel changes.  Musculoskeletal: Negative for gait problem or joint swelling.  Skin: Negative for rash.  Neurological: Negative for dizziness or headache.  No other specific complaints in a complete review of systems (except as listed in HPI above).      Objective:    BP 128/72   Pulse 94   Temp 98.7 F (37.1 C) (Oral)   Resp 18   Ht 5\' 10"  (1.778 m)   Wt (!) 377 lb 3.2 oz (171.1 kg)   LMP 09/06/2022 (Approximate)   SpO2 97%   BMI 54.12 kg/m   Wt Readings from Last 3 Encounters:  10/07/22 (!) 377 lb 3.2 oz (171.1 kg)  10/02/22 (!) 371 lb 9.6 oz (168.6 kg)  09/09/22 (!) 373 lb 14.4 oz (169.6 kg)    Physical Exam  Constitutional: Patient appears well-developed and well-nourished. Obese  No distress.  HEENT: head atraumatic, normocephalic, pupils equal and reactive to light, neck supple, throat within normal limits Cardiovascular: Normal rate, regular rhythm and normal heart sounds.  No murmur heard. No BLE edema. Pulmonary/Chest: Effort normal and breath sounds normal. No respiratory distress. Abdominal: Soft.  There is no tenderness. Psychiatric: Patient has a normal mood and affect. behavior is normal. Judgment and thought content normal.  Diabetic Foot Exam - Simple   Simple  Foot Form Diabetic Foot exam was performed with the following findings: Yes 10/07/2022  2:21 PM  Visual Inspection No deformities, no ulcerations, no other skin breakdown bilaterally: Yes Sensation Testing Intact to touch and monofilament testing bilaterally: Yes Pulse Check Posterior Tibialis and Dorsalis pulse intact bilaterally: Yes Comments     Results for orders placed or performed in visit on 10/02/22  Lipid panel  Result Value Ref Range   Cholesterol 139 <200 mg/dL   HDL 53 > OR = 50 mg/dL   Triglycerides 80 <409  mg/dL   LDL Cholesterol (Calc) 70 mg/dL (calc)   Total CHOL/HDL Ratio 2.6 <5.0 (calc)   Non-HDL Cholesterol (Calc) 86 <811 mg/dL (calc)  CBC with Differential/Platelet  Result Value Ref Range   WBC 7.7 3.8 - 10.8 Thousand/uL   RBC 4.36 3.80 - 5.10 Million/uL   Hemoglobin 10.2 (L) 11.7 - 15.5 g/dL   HCT 91.4 (L) 78.2 - 95.6 %   MCV 75.7 (L) 80.0 - 100.0 fL   MCH 23.4 (L) 27.0 - 33.0 pg   MCHC 30.9 (L) 32.0 - 36.0 g/dL   RDW 21.3 (H) 08.6 - 57.8 %   Platelets 274 140 - 400 Thousand/uL   MPV 10.2 7.5 - 12.5 fL   Neutro Abs 4,058 1,500 - 7,800 cells/uL   Lymphs Abs 2,749 850 - 3,900 cells/uL   Absolute Monocytes 639 200 - 950 cells/uL   Eosinophils Absolute 208 15 - 500 cells/uL   Basophils Absolute 46 0 - 200 cells/uL   Neutrophils Relative % 52.7 %   Total Lymphocyte 35.7 %   Monocytes Relative 8.3 %   Eosinophils Relative 2.7 %   Basophils Relative 0.6 %  COMPLETE METABOLIC PANEL WITH GFR  Result Value Ref Range   Glucose, Bld 79 65 - 99 mg/dL   BUN 15 7 - 25 mg/dL   Creat 4.69 (H) 6.29 - 0.99 mg/dL   eGFR 64 > OR = 60 BM/WUX/3.24M0   BUN/Creatinine Ratio 14 6 - 22 (calc)   Sodium 140 135 - 146 mmol/L   Potassium 3.9 3.5 - 5.3 mmol/L   Chloride 104 98 - 110 mmol/L   CO2 28 20 - 32 mmol/L   Calcium 8.9 8.6 - 10.2 mg/dL   Total Protein 7.4 6.1 - 8.1 g/dL   Albumin 4.4 3.6 - 5.1 g/dL   Globulin 3.0 1.9 - 3.7 g/dL (calc)   AG Ratio 1.5 1.0 - 2.5 (calc)   Total Bilirubin 0.4 0.2 - 1.2 mg/dL   Alkaline phosphatase (APISO) 53 31 - 125 U/L   AST 34 (H) 10 - 30 U/L   ALT 28 6 - 29 U/L  TSH  Result Value Ref Range   TSH 6.88 (H) mIU/L  Hemoglobin A1c  Result Value Ref Range   Hgb A1c MFr Bld 7.2 (H) <5.7 % of total Hgb   Mean Plasma Glucose 160 mg/dL   eAG (mmol/L) 8.9 mmol/L  VITAMIN D 25 Hydroxy (Vit-D Deficiency, Fractures)  Result Value Ref Range   Vit D, 25-Hydroxy 21 (L) 30 - 100 ng/mL  B12 and Folate Panel  Result Value Ref Range   Vitamin B-12 415 200 - 1,100  pg/mL   Folate 6.7 ng/mL  Iron, TIBC and Ferritin Panel  Result Value Ref Range   Iron 68 40 - 190 mcg/dL   TIBC 102 725 - 366 mcg/dL (calc)   %SAT 16 16 - 45 % (calc)   Ferritin 13 (L) 16 - 232 ng/mL  Assessment & Plan:   Problem List Items Addressed This Visit       Endocrine   Post-surgical hypothyroidism    Be more consistent with medication.  Follow up in 3 months for recheck.       New onset type 2 diabetes mellitus (HCC) - Primary    Start ozempic 0.25 mg weekly, glucometer sent in,check blood sugar twice a week and bring to next appointment.  Referral placed for DM eye exam.  Completed foot exam and microalbumin.       Relevant Medications   Semaglutide,0.25 or 0.5MG /DOS, (OZEMPIC, 0.25 OR 0.5 MG/DOSE,) 2 MG/3ML SOPN   Insulin Pen Needle 32G X 6 MM MISC   Blood Glucose Monitoring Suppl DEVI   Glucose Blood (BLOOD GLUCOSE TEST STRIPS) STRP   Lancet Device MISC   Lancets Misc. MISC   Other Relevant Orders   HM Diabetes Foot Exam (Completed)   Ambulatory referral to Optometry   Microalbumin / creatinine urine ratio     Other   Morbid obesity with BMI of 40.0-44.9, adult (HCC)    Starting ozempic      Relevant Medications   Semaglutide,0.25 or 0.5MG /DOS, (OZEMPIC, 0.25 OR 0.5 MG/DOSE,) 2 MG/3ML SOPN     Follow up plan: Return in about 3 months (around 01/06/2023) for follow up.

## 2022-10-07 NOTE — Assessment & Plan Note (Signed)
Starting ozempic.

## 2022-10-07 NOTE — Assessment & Plan Note (Signed)
Be more consistent with medication.  Follow up in 3 months for recheck.

## 2022-10-07 NOTE — Assessment & Plan Note (Signed)
Start ozempic 0.25 mg weekly, glucometer sent in,check blood sugar twice a week and bring to next appointment.  Referral placed for DM eye exam.  Completed foot exam and microalbumin.

## 2022-10-08 ENCOUNTER — Other Ambulatory Visit: Payer: Self-pay | Admitting: Nurse Practitioner

## 2022-10-08 DIAGNOSIS — R809 Proteinuria, unspecified: Secondary | ICD-10-CM

## 2022-10-08 DIAGNOSIS — E119 Type 2 diabetes mellitus without complications: Secondary | ICD-10-CM

## 2022-10-08 LAB — MICROALBUMIN / CREATININE URINE RATIO
Creatinine, Urine: 124 mg/dL (ref 20–275)
Microalb Creat Ratio: 41 mg/g creat — ABNORMAL HIGH (ref <30)
Microalb, Ur: 5.1 mg/dL

## 2022-10-08 MED ORDER — EMPAGLIFLOZIN 10 MG PO TABS
10.0000 mg | ORAL_TABLET | Freq: Every day | ORAL | 1 refills | Status: DC
Start: 2022-10-08 — End: 2022-12-03

## 2022-10-09 ENCOUNTER — Encounter: Payer: Self-pay | Admitting: Family Medicine

## 2022-10-09 ENCOUNTER — Ambulatory Visit: Payer: 59 | Admitting: Family Medicine

## 2022-10-09 VITALS — BP 171/70 | HR 93 | Wt 372.0 lb

## 2022-10-09 DIAGNOSIS — D500 Iron deficiency anemia secondary to blood loss (chronic): Secondary | ICD-10-CM

## 2022-10-09 DIAGNOSIS — Z1339 Encounter for screening examination for other mental health and behavioral disorders: Secondary | ICD-10-CM | POA: Diagnosis not present

## 2022-10-09 DIAGNOSIS — N920 Excessive and frequent menstruation with regular cycle: Secondary | ICD-10-CM

## 2022-10-09 NOTE — Progress Notes (Signed)
   GYNECOLOGY PROBLEM  VISIT ENCOUNTER NOTE  Subjective:   Sherrilee Steigerwald is a 42 y.o. G37P1011 female here for a problem GYN visit.  Current complaints: Menorrhagia  Menorrhagia: started in 2018. Reports cycles last  7 days. On heaviest days uses overnight pads and will soak through in 1-2 hours. Usually first 4 days are very heavy. Notes an abrupt change in 2018 after her c-section and thyroidectomy.  Here cycles are regular and monthly. She has a pcp.  Reports significant weight gain since 2018 with nearly 70lb weight gain.  Weight in Feb 2018 was 302  Pap in 2022 was WNL and also in 2018  Denies  discharge, pelvic pain, problems with intercourse or other gynecologic concerns.    Gynecologic History Patient's last menstrual period was 10/03/2022 (exact date).  Contraception: abstinence- not sexually active for   Health Maintenance Due  Topic Date Due   OPHTHALMOLOGY EXAM  Never done    The following portions of the patient's history were reviewed and updated as appropriate: allergies, current medications, past family history, past medical history, past social history, past surgical history and problem list.  Review of Systems Pertinent items are noted in HPI.   Objective:  BP (!) 171/70   Pulse 93   Wt (!) 372 lb (168.7 kg)   LMP 10/03/2022 (Exact Date)   BMI 53.38 kg/m    Repeat BP 171/70  Gen: well appearing, NAD HEENT: no scleral icterus CV: RR Lung: Normal WOB Ext: warm well perfused  PELVIC: Normal appearing external genitalia; normal appearing vaginal mucosa and cervix- it very anterior.  No abnormal discharge noted.  Unable to obtain Pap smear and EMB as planned due to habitus.  Normal uterine size, no other palpable masses, no uterine or adnexal tenderness.   Assessment and Plan:  1. Iron deficiency anemia due to chronic blood loss - US PELVIC COMPLETE WITH TRANSVAGINAL; Future  2. Menorrhagia with regular cycle Unable to complete pap and EMB-- cervix is  very anterior and patient was not tolerating speculum exam. Additionally placement of the speculum was difficult with habitus. Might need OR EMB if concern for endometrial pathology increases. I was not able to visualize the cervix today. Will get [place] to help stratify need for EMB.  - Discussed also hormonal agents to help control cycle. We will get her [place] to help guide therapy.  - US PELVIC COMPLETE WITH TRANSVAGINAL; Future  3. HTN  - encouraged PCP care to discuss further manage  Please refer to After Visit Summary for other counseling recommendations.   No follow-ups on file.  Federico Flake, MD, MPH, ABFM Attending Physician Faculty Practice- Center for [facility]

## 2022-10-09 NOTE — Progress Notes (Signed)
New GYN per referral here with complaints of menorrhagia.  Cycles last 1 week has been a issue since 2018. Pt also reports weight gain. Unsure if that may be cause of heavy cycles.  LMP:10/03/22 still spotting today.  Mammogram:10/01/21 WNL   Last pap: 09/19/20 wants pap today.   STD Screening: Declines.   Fun Fact :  pt loves listening to music.

## 2022-10-16 ENCOUNTER — Encounter: Payer: Self-pay | Admitting: *Deleted

## 2022-10-16 ENCOUNTER — Ambulatory Visit: Payer: 59

## 2022-10-19 ENCOUNTER — Ambulatory Visit
Admission: RE | Admit: 2022-10-19 | Discharge: 2022-10-19 | Disposition: A | Discharge status: Home / Self Care | Payer: 59 | Source: Ambulatory Visit | Attending: Family Medicine | Admitting: Family Medicine

## 2022-10-19 DIAGNOSIS — N920 Excessive and frequent menstruation with regular cycle: Secondary | ICD-10-CM | POA: Insufficient documentation

## 2022-10-19 DIAGNOSIS — D500 Iron deficiency anemia secondary to blood loss (chronic): Secondary | ICD-10-CM | POA: Insufficient documentation

## 2022-11-02 ENCOUNTER — Ambulatory Visit
Admission: RE | Admit: 2022-11-02 | Discharge: 2022-11-02 | Disposition: A | Discharge status: Home / Self Care | Payer: 59 | Source: Ambulatory Visit | Attending: Family Medicine | Admitting: Family Medicine

## 2022-11-02 DIAGNOSIS — Z1231 Encounter for screening mammogram for malignant neoplasm of breast: Secondary | ICD-10-CM | POA: Insufficient documentation

## 2022-11-13 IMAGING — MG MM DIGITAL SCREENING BILAT W/ TOMO AND CAD
6 of 12 series · 6 of 36 positions shown · non-contrast
Comparison: Previous exam(s).

ACR Breast Density Category a: The breast tissue is almost entirely
fatty.

CLINICAL DATA: Screening.

EXAM:
DIGITAL SCREENING BILATERAL MAMMOGRAM WITH TOMOSYNTHESIS AND CAD
TECHNIQUE: Bilateral screening digital craniocaudal and mediolateral oblique
mammograms were obtained. Bilateral screening digital breast
tomosynthesis was performed. The images were evaluated with
computer-aided detection.

[R CC synth-2D]
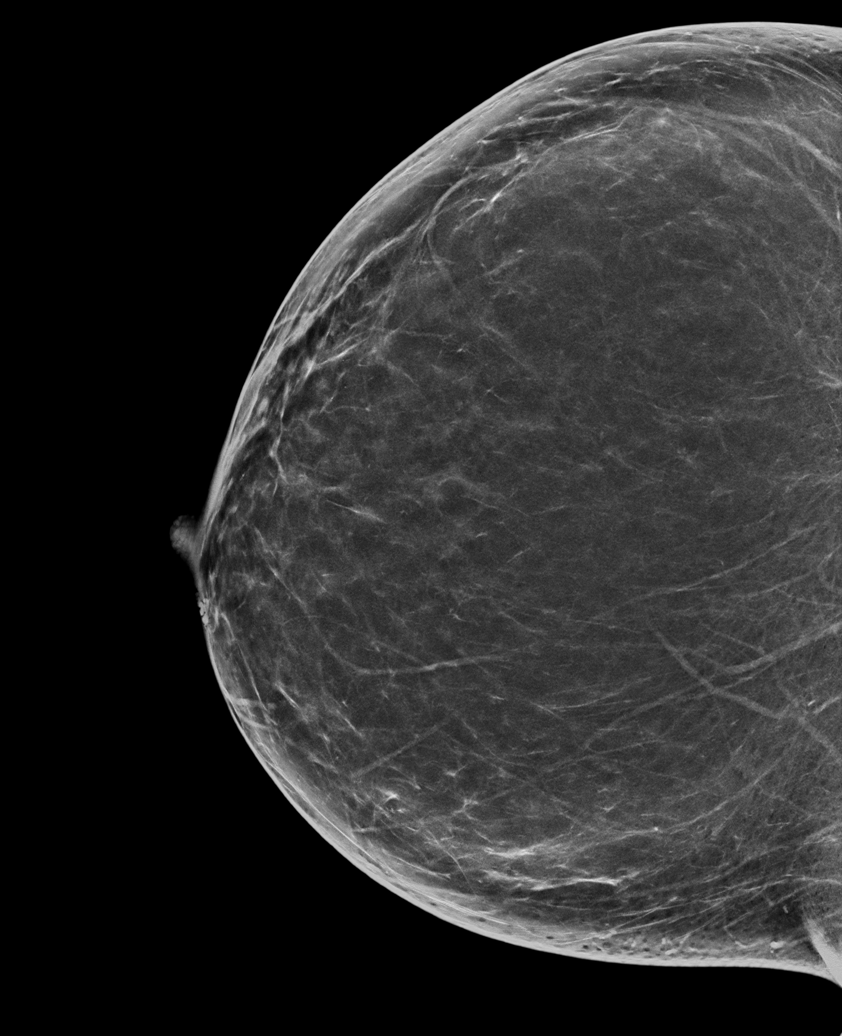

[L MLO synth-2D (1 of 2)]
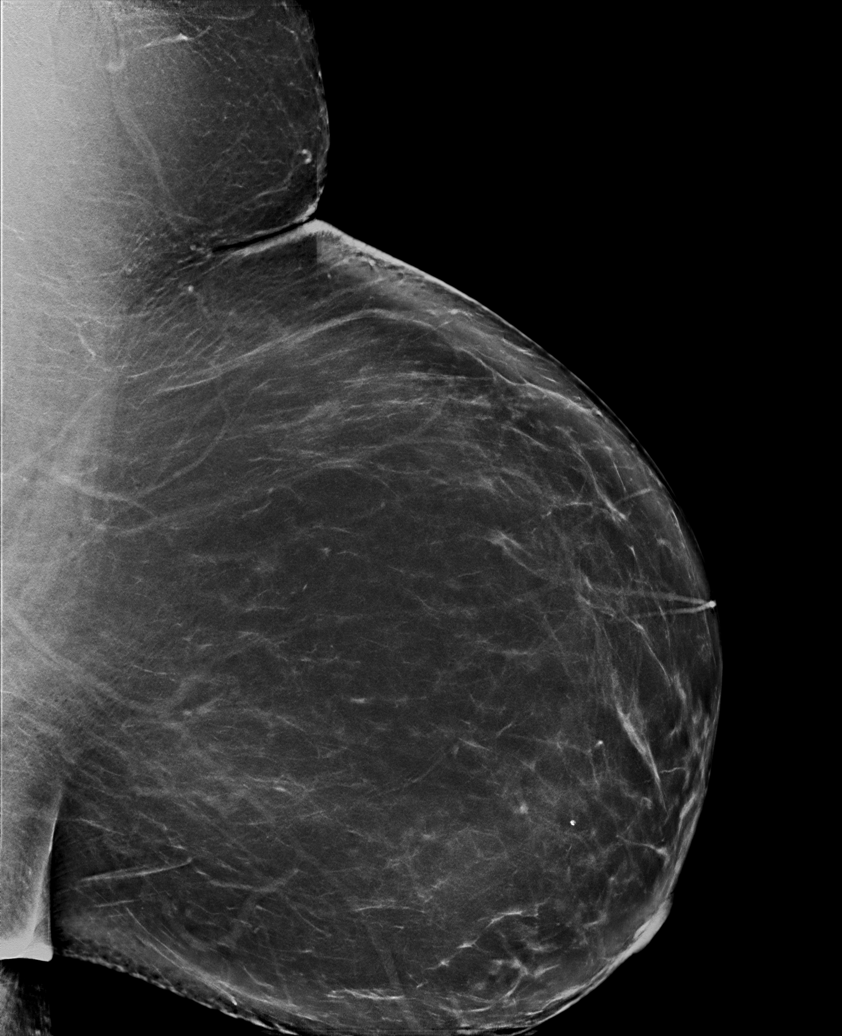

[R MLO synth-2D (1 of 2)]
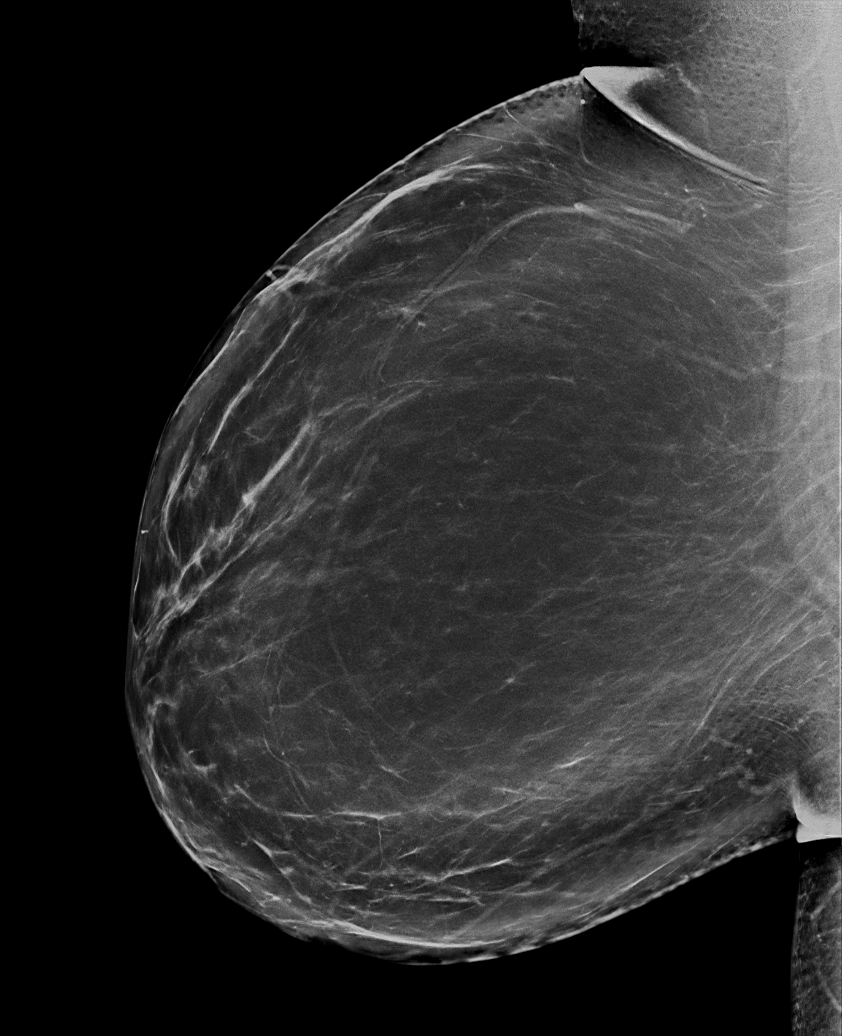

[L CC synth-2D]
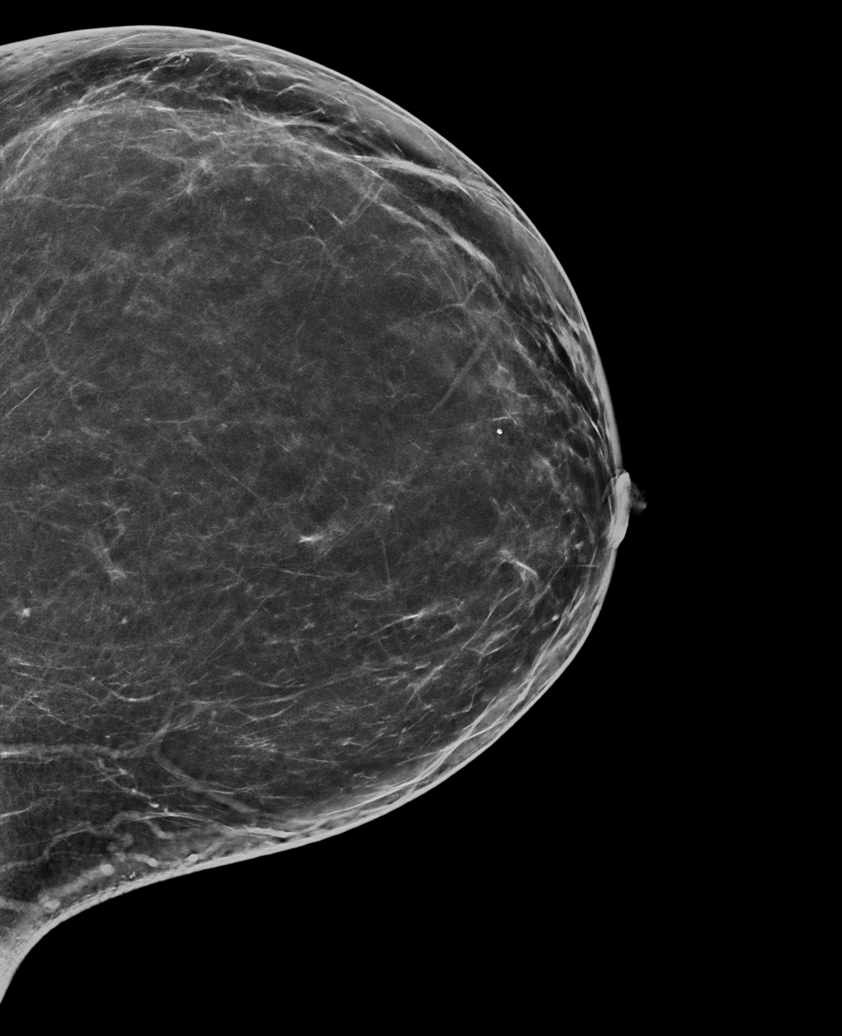

[R MLO synth-2D (2 of 2)]
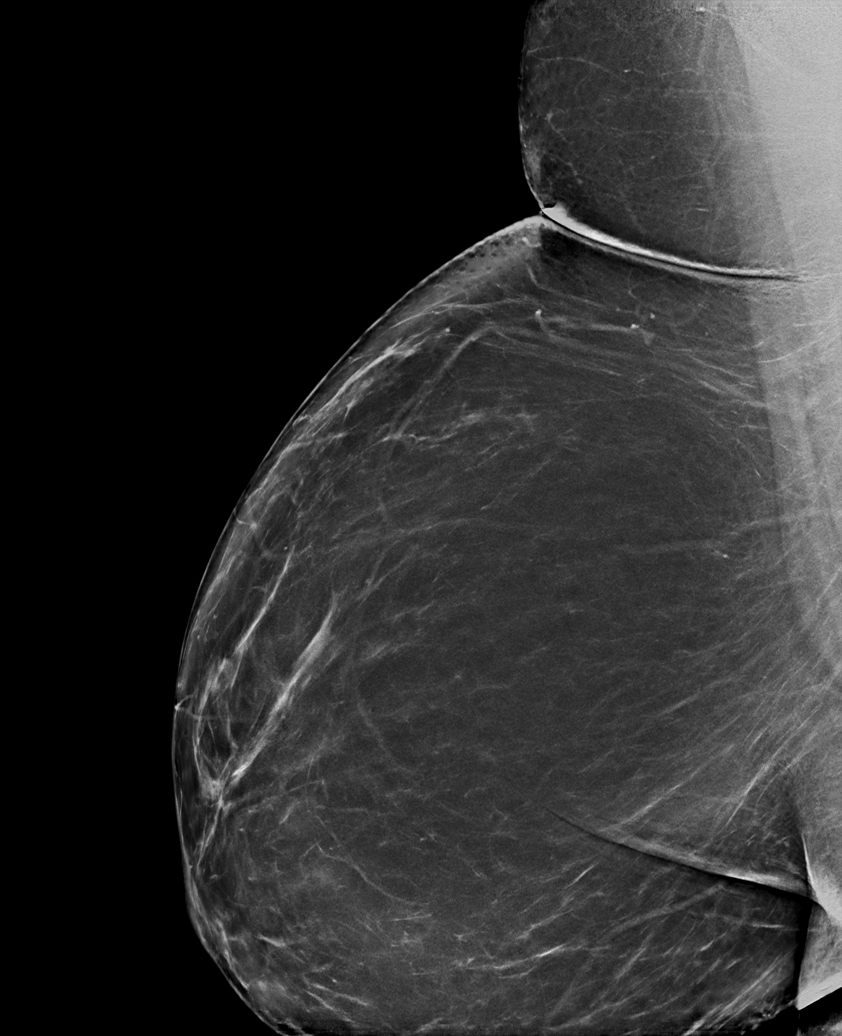

[L MLO synth-2D (2 of 2)]
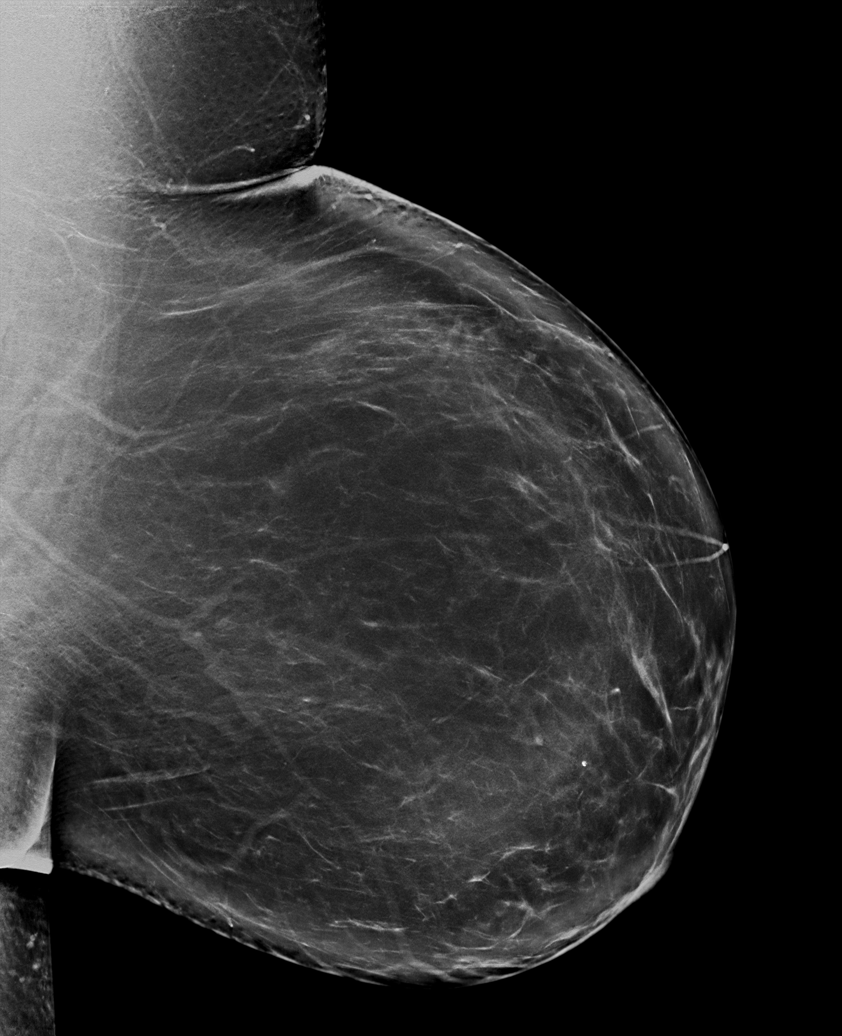

[6 of 36 positions shown; findings below may reference images not displayed]

FINDINGS: There are no findings suspicious for malignancy.
IMPRESSION: No mammographic evidence of malignancy. A result letter of this
screening mammogram will be mailed directly to the patient.

RECOMMENDATION:
Screening mammogram in one year. (Code:0E-3-N98)

BI-RADS CATEGORY  1: Negative.

## 2022-11-19 ENCOUNTER — Ambulatory Visit: Payer: 59 | Admitting: Family Medicine

## 2022-11-24 ENCOUNTER — Encounter: Payer: Self-pay | Admitting: Obstetrics & Gynecology

## 2022-11-24 ENCOUNTER — Ambulatory Visit: Payer: 59 | Admitting: Obstetrics & Gynecology

## 2022-11-24 VITALS — BP 147/66 | HR 89 | Ht 70.0 in | Wt 372.8 lb

## 2022-11-24 DIAGNOSIS — Z538 Procedure and treatment not carried out for other reasons: Secondary | ICD-10-CM

## 2022-11-24 DIAGNOSIS — N920 Excessive and frequent menstruation with regular cycle: Secondary | ICD-10-CM

## 2022-11-24 NOTE — Progress Notes (Signed)
GYNECOLOGY OFFICE VISIT NOTE  History:   Michelle Ware is a 42 y.o. G2P1011 with morbid obesity and menorrhagia here today for repeat attempt of pap smear and endometrial biopsy evaluation.  This was attempted 10/09/22, unable to be done due to habitus, difficulty with examination. Patient reported her last heavy period was in September, missed a period in October.  She denies any current abnormal vaginal discharge, bleeding, pelvic pain or other concerns.    Past Medical History:  Diagnosis Date   Anemia    Dermatophytosis of foot    Fibroid    Goiter    Gross hematuria    Herpes, genital    Hypertension    Low grade squamous intraepithelial lesion (LGSIL)    Morbid obesity with BMI of 40.0-44.9, adult (HCC)    Polycystic ovarian syndrome    Vaginal Pap smear, abnormal    Vitamin D deficiency     Past Surgical History:  Procedure Laterality Date   BIOPSY THYROID  07/2015   Dr. Tedd Sias   CESAREAN SECTION N/A 09/28/2016   Procedure: Primary CESAREAN SECTION;  Surgeon: Maxie Better, MD;  [facility]: [facility];  Service: Obstetrics;  Laterality: N/A;  EDD: 10/19/16 Allergy: Hydrocodone-Acetaminophen   LAPAROSCOPIC GELPORT ASSISTED MYOMECTOMY N/A 03/11/2015   Procedure: LAPAROSCOPIC  MYOMECTOMY--attempted;  Surgeon: Hildred Laser, MD;  Location: [facility] ORS;  Service: Gynecology;  Laterality: N/A;   LAPAROTOMY N/A 03/11/2015   Procedure: LAPAROTOMY--MYOMECTOMY;  Surgeon: Hildred Laser, MD;  Location: [facility] ORS;  Service: Gynecology;  Laterality: N/A;   MOUTH SURGERY  1996   TOTAL THYROIDECTOMY Bilateral 03/05/2017   Dr. Selena Batten [facility]    The following portions of the patient's history were reviewed and updated as appropriate: allergies, current medications, past family history, past medical history, past social history, past surgical history and problem list.   Health Maintenance:  Normal pap and negative HRHPV on 09/19/2020.  Normal mammogram on 11/01/2020.   Review of Systems:   Pertinent items noted in HPI and remainder of comprehensive ROS otherwise negative.  Physical Exam:  BP (!) 147/66   Pulse 89   Ht 5\' 10"  (1.778 m)   Wt (!) 372 lb 12.8 oz (169.1 kg)   LMP 10/07/2022   BMI 53.49 kg/m  CONSTITUTIONAL: Well-developed, well-nourished female in no acute distress.  HEENT:  Normocephalic, atraumatic. External right and left ear normal. No scleral icterus.  NECK: Normal range of motion, supple, no masses noted on observation SKIN: No rash noted. Not diaphoretic. No erythema. No pallor. MUSCULOSKELETAL: Normal range of motion. No edema noted. NEUROLOGIC: Alert and oriented to person, place, and time. Normal muscle tone coordination. No cranial nerve deficit noted. PSYCHIATRIC: Normal mood and affect. Normal behavior. Normal judgment and thought content. CARDIOVASCULAR: Normal heart rate noted RESPIRATORY: Effort and breath sounds normal, no problems with respiration noted ABDOMEN: No masses noted. No other overt distention noted.   PELVIC: Normal appearing external genitalia.  Patient was involuntary tensing during attempt of examination, unable to initiate examination or place speculum in vagina.  Habitus also made this very challenging.  After a few attempts, I decided to stop.  Never was able to do a speculum examination. Performed in the presence of a chaperone  Labs and Imaging Result Date: 11/07/2022 CLINICAL DATA:  menorrhagia EXAM: TRANSABDOMINAL AND TRANSVAGINAL ULTRASOUND OF PELVIS TECHNIQUE: Both transabdominal and transvaginal ultrasound examinations of the pelvis were performed. Transabdominal technique was performed for global imaging of the pelvis including uterus, ovaries, adnexal regions, and pelvic cul-de-sac.  It was necessary to proceed with endovaginal exam following the transabdominal exam to visualize the uterus, endometrium. COMPARISON:  October 05, 2007. FINDINGS: Uterus Measurements: 8.6 x 5.5 x 5.3 cm = volume: 131 mL. Similar appearance  of a stealthy myometrial fibroid with mass effect on the endometrium. It measures approximately 5.2 x 5.4 x 5.2 cm, similar in 2009 Endometrium Thickness: 4 mm.  No focal abnormality in the visualized portion. Right ovary Not visualized transabdominally or transvaginally. Left ovary Not visualized transabdominally or transvaginally. Other findings No abnormal free fluid. IMPRESSION: 1. Similar appearance of a 5.4 cm myometrial fibroid with mass effect on the endometrium. Electronically Signed   By: Meda Klinefelter M.D.   On: 11/07/2022 13:58      Assessment and Plan:     1. Failure of attempted office evaluation 2. Morbid obesity (HCC) 3. Menorrhagia with regular cycle Reviewed ultrasound results, reassured by 4 mm endometrial thickness and stable myometrial fibroid. Unable to do endometrial biopsy and pap as scheduled today, very difficult examination. Offered to do this under anesthesia, also recommended placement of progestin IUD at that time to help with her menorrhagia and also help with endometrial protection given that she is at risk for endometrial pathology. Risks of procedure reviewed, she is hesitant about the IUD but willing to do this if her heavy bleeding continues. Will schedule Exam under anesthesia, Pap smear, Hysteroscopy, Dilation and Curettage, Mirena intrauterine device placement, patient will be contacted with details of procedure. - Ambulatory Referral For Surgery Scheduling  Routine preventative health maintenance measures emphasized. Please refer to After Visit Summary for other counseling recommendations.   Return for follow up as recommended.    I spent 40 minutes dedicated to the care of this patient including pre-visit review of records, face to face time with the patient discussing her conditions and treatments and post visit orders.    Jaynie Collins, MD, FACOG Obstetrician & Gynecologist, [facility] for [employer], [facility]

## 2022-12-03 ENCOUNTER — Ambulatory Visit: Payer: 59 | Admitting: Nurse Practitioner

## 2022-12-03 ENCOUNTER — Encounter: Payer: Self-pay | Admitting: Nurse Practitioner

## 2022-12-03 ENCOUNTER — Other Ambulatory Visit: Payer: Self-pay

## 2022-12-03 VITALS — BP 128/72 | HR 95 | Temp 97.9°F | Resp 16 | Ht 70.0 in | Wt 377.7 lb

## 2022-12-03 DIAGNOSIS — E119 Type 2 diabetes mellitus without complications: Secondary | ICD-10-CM | POA: Diagnosis not present

## 2022-12-03 DIAGNOSIS — E890 Postprocedural hypothyroidism: Secondary | ICD-10-CM | POA: Diagnosis not present

## 2022-12-03 DIAGNOSIS — R809 Proteinuria, unspecified: Secondary | ICD-10-CM

## 2022-12-03 DIAGNOSIS — Z7985 Long-term (current) use of injectable non-insulin antidiabetic drugs: Secondary | ICD-10-CM | POA: Diagnosis not present

## 2022-12-03 MED ORDER — OZEMPIC (0.25 OR 0.5 MG/DOSE) 2 MG/3ML ~~LOC~~ SOPN: 0.5000 mg | PEN_INJECTOR | SUBCUTANEOUS | 0 refills | Status: DC

## 2022-12-03 MED ORDER — EMPAGLIFLOZIN 10 MG PO TABS: 10.0000 mg | ORAL_TABLET | Freq: Every day | ORAL | 1 refills | Status: DC

## 2022-12-03 MED ORDER — INSULIN PEN NEEDLE 32G X 6 MM MISC: 1.0000 | 1 refills | Status: DC

## 2022-12-03 NOTE — Assessment & Plan Note (Signed)
Doing well and is being more consistent with her medication.  Will get labs at next visit.

## 2022-12-03 NOTE — Assessment & Plan Note (Signed)
She has been doing well on Ozempic 0.25 mg patient states she has not been checking her blood sugars.  But she will go pick up her glucometer.  She also has not started the Jardiance due to cost.  However patient states she is can go pick it up now coupon provided.

## 2022-12-03 NOTE — Progress Notes (Signed)
BP 128/72   Pulse 95   Temp 97.9 F (36.6 C) (Oral)   Resp 16   Ht 5\' 10"  (1.778 m)   Wt (!) 377 lb 11.2 oz (171.3 kg)   LMP 10/07/2022   SpO2 99%   BMI 54.19 kg/m    Subjective:    Patient ID: Michelle Ware, female    DOB: 1980-04-15, 42 y.o.   MRN: [id number]  HPI: Michelle Ware is a 42 y.o. female  Chief Complaint  Patient presents with   Medical Management of Chronic Issues   New onset Diabetes, Type 2:  -Last A1c 7.2 -Medications: ozempic 0.25 mg- will move up to 0.5 mg weekly, has not started the jardiance yet -Checking BG at home: has not started checking her blood sugar -Diet: try and reduce sugar and processed foods in your diet, eating a lower carbohydrate diet can help too -Exercise: recommend 150 min of physical activity weekly   -Eye exam: needs to schedule -Foot exam: utd -Microalbumin: utd -Statin: no -Denies symptoms of hypoglycemia, polyuria, polydipsia, numbness extremities, foot ulcers/trauma.  -discussed disease process, and health maintenance Started patient on ozempic and jardiance  Hypothyroidism: previousTSH was elevated at 6.88. she was taking levothyroxine 200 mcg daily and an additional 1/2 tab once a week. Discussed administration that patient is taking it first thing in the morning on an empty stomach.  She had reported she has not been consistent with taking the additional 1/2 tab once a week. She said she was going to get a drug box and set her pills up so she will remember and be more consistent.  Patient reports she is being more consistent with her levothyroxine. Will get labs at next appointment.      Relevant past medical, surgical, family and social history reviewed and updated as indicated. Interim medical history since our last visit reviewed. Allergies and medications reviewed and updated.  Review of Systems  Constitutional: Negative for fever or weight change.  Respiratory: Negative for cough and shortness of breath.    Cardiovascular: Negative for chest pain or palpitations.  Gastrointestinal: Negative for abdominal pain, no bowel changes.  Musculoskeletal: Negative for gait problem or joint swelling.  Skin: Negative for rash.  Neurological: Negative for dizziness or headache.  No other specific complaints in a complete review of systems (except as listed in HPI above).      Objective:    BP 128/72   Pulse 95   Temp 97.9 F (36.6 C) (Oral)   Resp 16   Ht 5\' 10"  (1.778 m)   Wt (!) 377 lb 11.2 oz (171.3 kg)   LMP 10/07/2022   SpO2 99%   BMI 54.19 kg/m   Wt Readings from Last 3 Encounters:  12/03/22 (!) 377 lb 11.2 oz (171.3 kg)  11/24/22 (!) 372 lb 12.8 oz (169.1 kg)  10/09/22 (!) 372 lb (168.7 kg)    Physical Exam  Constitutional: Patient appears well-developed and well-nourished. Obese  No distress.  HEENT: head atraumatic, normocephalic, pupils equal and reactive to light, neck supple, throat within normal limits Cardiovascular: Normal rate, regular rhythm and normal heart sounds.  No murmur heard. No BLE edema. Pulmonary/Chest: Effort normal and breath sounds normal. No respiratory distress. Abdominal: Soft.  There is no tenderness. Psychiatric: Patient has a normal mood and affect. behavior is normal. Judgment and thought content normal.  Diabetic Foot Exam - Simple   No data filed     Results for orders placed or performed in visit on  10/07/22  Microalbumin / creatinine urine ratio  Result Value Ref Range   Creatinine, Urine 124 20 - 275 mg/dL   Microalb, Ur 5.1 mg/dL   Microalb Creat Ratio 41 (H) <30 mg/g creat      Assessment & Plan:   Problem List Items Addressed This Visit       Endocrine   Post-surgical hypothyroidism - Primary    Doing well and is being more consistent with her medication.  Will get labs at next visit.      New onset type 2 diabetes mellitus (HCC)    She has been doing well on Ozempic 0.25 mg patient states she has not been checking her blood  sugars.  But she will go pick up her glucometer.  She also has not started the Jardiance due to cost.  However patient states she is can go pick it up now coupon provided.      Relevant Medications   empagliflozin (JARDIANCE) 10 MG TABS tablet   Semaglutide,0.25 or 0.5MG /DOS, (OZEMPIC, 0.25 OR 0.5 MG/DOSE,) 2 MG/3ML SOPN   Insulin Pen Needle 32G X 6 MM MISC     Other   Microalbuminuria    She has been doing well on Ozempic 0.25 mg patient states she has not been checking her blood sugars.  But she will go pick up her glucometer.  She also has not started the Jardiance due to cost.  However patient states she is can go pick it up now coupon provided.      Relevant Medications   empagliflozin (JARDIANCE) 10 MG TABS tablet      Follow up plan: Return for follow up appointment scheduled.

## 2022-12-29 ENCOUNTER — Other Ambulatory Visit: Payer: Self-pay | Admitting: Nurse Practitioner

## 2022-12-29 DIAGNOSIS — E119 Type 2 diabetes mellitus without complications: Secondary | ICD-10-CM

## 2022-12-30 NOTE — Telephone Encounter (Signed)
Requested Prescriptions  Pending Prescriptions Disp Refills   Semaglutide,0.25 or 0.5MG /DOS, (OZEMPIC, 0.25 OR 0.5 MG/DOSE,) 2 MG/3ML SOPN [Pharmacy Med Name: OZEMPIC 0.25-0.5 MG/DOSE PEN] 3 mL 1    Sig: INJECT 0.5 MG INTO THE SKIN ONE TIME PER WEEK     Endocrinology:  Diabetes - GLP-1 Receptor Agonists - semaglutide Failed - 12/29/2022  1:35 AM      Failed - HBA1C in normal range and within 180 days    Hgb A1c MFr Bld  Date Value Ref Range Status  10/02/2022 7.2 (H) <5.7 % of total Hgb Final    Comment:    For someone without known diabetes, a hemoglobin A1c value of 6.5% or greater indicates that they may have  diabetes and this should be confirmed with a follow-up  test. . For someone with known diabetes, a value <7% indicates  that their diabetes is well controlled and a value  greater than or equal to 7% indicates suboptimal  control. A1c targets should be individualized based on  duration of diabetes, age, comorbid conditions, and  other considerations. . Currently, no consensus exists regarding use of hemoglobin A1c for diagnosis of diabetes for children. .          Failed - Cr in normal range and within 360 days    Creat  Date Value Ref Range Status  10/02/2022 1.09 (H) 0.50 - 0.99 mg/dL Final   Creatinine, Urine  Date Value Ref Range Status  10/07/2022 124 20 - 275 mg/dL Final         Passed - Valid encounter within last 6 months    Recent Outpatient Visits           3 weeks ago Post-surgical hypothyroidism   [facility] Della Goo F, FNP   2 months ago New onset type 2 diabetes mellitus [facility])   [facility] Berniece Salines, FNP   2 months ago Well adult exam   [facility] Alba Cory, MD   3 months ago Morbid obesity with BMI of 40.0-44.9, adult [facility])   [facility] Alba Cory, MD   7 months ago Morbid obesity with BMI of 40.0-44.9, adult  [facility])   [facility] Alba Cory, MD       Future Appointments             In 1 week Zane Herald, Rudolpho Sevin, FNP [facility], [facility]   In 3 months Alba Cory, MD [facility], [facility]   In 9 months Alba Cory, MD [facility], [facility]

## 2023-01-05 NOTE — Progress Notes (Unsigned)
   There were no vitals taken for this visit.   Subjective:    Patient ID: Marji Menke, female    DOB: 05/17/1980, 42 y.o.   MRN: [id number]  HPI: Shilah Plagens is a 42 y.o. female  No chief complaint on file.   Discussed the use of AI scribe software for clinical note transcription with the patient, who gave verbal consent to proceed.  History of Present Illness           12/03/2022    3:06 PM 10/09/2022   10:06 AM 10/07/2022    2:11 PM  Depression screen PHQ 2/9  Decreased Interest 0 0 0  Down, Depressed, Hopeless 0 0 0  PHQ - 2 Score 0 0 0  Altered sleeping 0 0   Tired, decreased energy 0 0   Change in appetite 3 0   Feeling bad or failure about yourself  0 0   Trouble concentrating 0 0   Moving slowly or fidgety/restless 0 0   Suicidal thoughts 0 0   PHQ-9 Score 3 0   Difficult doing work/chores Not difficult at all Not difficult at all     Relevant past medical, surgical, family and social history reviewed and updated as indicated. Interim medical history since our last visit reviewed. Allergies and medications reviewed and updated.  Review of Systems  Per HPI unless specifically indicated above     Objective:    There were no vitals taken for this visit.  {Vitals History (Optional):23777} Wt Readings from Last 3 Encounters:  12/03/22 (!) 377 lb 11.2 oz (171.3 kg)  11/24/22 (!) 372 lb 12.8 oz (169.1 kg)  10/09/22 (!) 372 lb (168.7 kg)    Physical Exam  Results for orders placed or performed in visit on 10/07/22  Microalbumin / creatinine urine ratio   Collection Time: 10/07/22  2:28 PM  Result Value Ref Range   Creatinine, Urine 124 20 - 275 mg/dL   Microalb, Ur 5.1 mg/dL   Microalb Creat Ratio 41 (H) <30 mg/g creat   {Labs (Optional):23779}    Assessment & Plan:   Problem List Items Addressed This Visit   None    Assessment and Plan             Follow up plan: No follow-ups on file.

## 2023-01-06 ENCOUNTER — Encounter: Payer: Self-pay | Admitting: Nurse Practitioner

## 2023-01-06 ENCOUNTER — Ambulatory Visit (INDEPENDENT_AMBULATORY_CARE_PROVIDER_SITE_OTHER): Payer: 59 | Admitting: Nurse Practitioner

## 2023-01-06 VITALS — BP 132/82 | HR 98 | Temp 98.3°F | Resp 18 | Ht 70.0 in | Wt 372.4 lb

## 2023-01-06 DIAGNOSIS — E890 Postprocedural hypothyroidism: Secondary | ICD-10-CM

## 2023-01-06 DIAGNOSIS — Z7985 Long-term (current) use of injectable non-insulin antidiabetic drugs: Secondary | ICD-10-CM

## 2023-01-06 DIAGNOSIS — F411 Generalized anxiety disorder: Secondary | ICD-10-CM

## 2023-01-06 DIAGNOSIS — I10 Essential (primary) hypertension: Secondary | ICD-10-CM | POA: Diagnosis not present

## 2023-01-06 DIAGNOSIS — K219 Gastro-esophageal reflux disease without esophagitis: Secondary | ICD-10-CM | POA: Diagnosis not present

## 2023-01-06 DIAGNOSIS — E282 Polycystic ovarian syndrome: Secondary | ICD-10-CM

## 2023-01-06 DIAGNOSIS — N1831 Chronic kidney disease, stage 3a: Secondary | ICD-10-CM

## 2023-01-06 DIAGNOSIS — Z6841 Body Mass Index (BMI) 40.0 and over, adult: Secondary | ICD-10-CM

## 2023-01-06 DIAGNOSIS — E1169 Type 2 diabetes mellitus with other specified complication: Secondary | ICD-10-CM

## 2023-01-06 DIAGNOSIS — E119 Type 2 diabetes mellitus without complications: Secondary | ICD-10-CM

## 2023-01-07 LAB — COMPLETE METABOLIC PANEL WITH GFR
AG Ratio: 1.3 (calc) (ref 1.0–2.5)
ALT: 20 U/L (ref 6–29)
AST: 31 U/L — ABNORMAL HIGH (ref 10–30)
Albumin: 4.2 g/dL (ref 3.6–5.1)
Alkaline phosphatase (APISO): 55 U/L (ref 31–125)
BUN/Creatinine Ratio: 13 (calc) (ref 6–22)
BUN: 15 mg/dL (ref 7–25)
CO2: 26 mmol/L (ref 20–32)
Calcium: 9.3 mg/dL (ref 8.6–10.2)
Chloride: 104 mmol/L (ref 98–110)
Creat: 1.12 mg/dL — ABNORMAL HIGH (ref 0.50–0.99)
Globulin: 3.2 g/dL (ref 1.9–3.7)
Glucose, Bld: 100 mg/dL — ABNORMAL HIGH (ref 65–99)
Potassium: 4.2 mmol/L (ref 3.5–5.3)
Sodium: 140 mmol/L (ref 135–146)
Total Bilirubin: 0.3 mg/dL (ref 0.2–1.2)
Total Protein: 7.4 g/dL (ref 6.1–8.1)
eGFR: 62 mL/min/{1.73_m2} (ref >=60)

## 2023-01-07 LAB — CBC WITH DIFFERENTIAL/PLATELET
Absolute Lymphocytes: 2747 {cells}/uL (ref 850–3900)
Absolute Monocytes: 631 {cells}/uL (ref 200–950)
Basophils Absolute: 33 {cells}/uL (ref 0–200)
Basophils Relative: 0.4 %
Eosinophils Absolute: 100 {cells}/uL (ref 15–500)
Eosinophils Relative: 1.2 %
HCT: 33.6 % — ABNORMAL LOW (ref 35.0–45.0)
Hemoglobin: 10.4 g/dL — ABNORMAL LOW (ref 11.7–15.5)
MCH: 23.6 pg — ABNORMAL LOW (ref 27.0–33.0)
MCHC: 31 g/dL — ABNORMAL LOW (ref 32.0–36.0)
MCV: 76.4 fL — ABNORMAL LOW (ref 80.0–100.0)
MPV: 10.6 fL (ref 7.5–12.5)
Monocytes Relative: 7.6 %
Neutro Abs: 4789 {cells}/uL (ref 1500–7800)
Neutrophils Relative %: 57.7 %
Platelets: 302 10*3/uL (ref 140–400)
RBC: 4.4 10*6/uL (ref 3.80–5.10)
RDW: 15.2 % — ABNORMAL HIGH (ref 11.0–15.0)
Total Lymphocyte: 33.1 %
WBC: 8.3 10*3/uL (ref 3.8–10.8)

## 2023-01-07 LAB — HEMOGLOBIN A1C
Hgb A1c MFr Bld: 6.8 %{Hb} — ABNORMAL HIGH (ref <5.7)
Mean Plasma Glucose: 148 mg/dL
eAG (mmol/L): 8.2 mmol/L

## 2023-01-07 LAB — TSH: TSH: 6.46 m[IU]/L — ABNORMAL HIGH

## 2023-01-21 ENCOUNTER — Telehealth: Payer: Self-pay

## 2023-01-21 NOTE — Progress Notes (Signed)
 Spoke with pt, stated she would need to re-schedule, verbalized instructions to call surgeons office to re-schedule.

## 2023-01-26 NOTE — Telephone Encounter (Signed)
 Patient called [facility] to cancel procedure w/ Dr. Macon Large on 01/27/23. Surgery cancelled.

## 2023-01-27 ENCOUNTER — Ambulatory Visit (HOSPITAL_BASED_OUTPATIENT_CLINIC_OR_DEPARTMENT_OTHER): Admission: RE | Admit: 2023-01-27 | Payer: 59 | Source: Home / Self Care | Admitting: Obstetrics & Gynecology

## 2023-01-27 SURGERY — DILATATION AND CURETTAGE /HYSTEROSCOPY
Anesthesia: Choice

## 2023-01-29 ENCOUNTER — Telehealth: Payer: Self-pay

## 2023-01-29 NOTE — Telephone Encounter (Signed)
 Called patient to let her know Dr. Herchel only available date in January was on 01/27/23. Since patient was not available her procedure was canceled. Also stated her only OR date in February is 03/03/23. Advised I would wait to hear from patient before securing the date. Left my direct number [phone].

## 2023-02-11 ENCOUNTER — Telehealth: Payer: Self-pay

## 2023-02-11 NOTE — Telephone Encounter (Signed)
No answer from pt LVM calling to let her know we received a letter of Levothyroxine being recalled. Pt needs to go to pharmacy to get new medication bottle.

## 2023-03-04 ENCOUNTER — Other Ambulatory Visit: Payer: Self-pay | Admitting: Nurse Practitioner

## 2023-03-04 DIAGNOSIS — E119 Type 2 diabetes mellitus without complications: Secondary | ICD-10-CM

## 2023-03-05 NOTE — Telephone Encounter (Signed)
Requested Prescriptions  Pending Prescriptions Disp Refills   Semaglutide,0.25 or 0.5MG /DOS, (OZEMPIC, 0.25 OR 0.5 MG/DOSE,) 2 MG/3ML SOPN [Pharmacy Med Name: OZEMPIC 0.25-0.5 MG/DOSE PEN] 3 mL 0    Sig: INJECT 0.5 MG INTO THE SKIN ONE TIME PER WEEK     Endocrinology:  Diabetes - GLP-1 Receptor Agonists - semaglutide Failed - 03/05/2023 12:23 PM      Failed - HBA1C in normal range and within 180 days    Hgb A1c MFr Bld  Date Value Ref Range Status  01/06/2023 6.8 (H) <5.7 % of total Hgb Final    Comment:    For someone without known diabetes, a hemoglobin A1c value of 6.5% or greater indicates that they may have  diabetes and this should be confirmed with a follow-up  test. . For someone with known diabetes, a value <7% indicates  that their diabetes is well controlled and a value  greater than or equal to 7% indicates suboptimal  control. A1c targets should be individualized based on  duration of diabetes, age, comorbid conditions, and  other considerations. . Currently, no consensus exists regarding use of hemoglobin A1c for diagnosis of diabetes for children. .          Failed - Cr in normal range and within 360 days    Creat  Date Value Ref Range Status  01/06/2023 1.12 (H) 0.50 - 0.99 mg/dL Final   Creatinine, Urine  Date Value Ref Range Status  10/07/2022 124 20 - 275 mg/dL Final         Passed - Valid encounter within last 6 months    Recent Outpatient Visits           1 month ago Essential hypertension   [facility] Berniece Salines, FNP   3 months ago Post-surgical hypothyroidism   [facility] Della Goo F, FNP   4 months ago New onset type 2 diabetes mellitus [facility])   [facility] Berniece Salines, FNP   5 months ago Well adult exam   [facility] Alba Cory, MD   5 months ago Morbid obesity with BMI of 40.0-44.9, adult [facility])   [facility]  [facility] Alba Cory, MD       Future Appointments             In 1 month Carlynn Purl, Danna Hefty, MD [facility], [facility]   In 7 months Alba Cory, MD [facility], [facility]

## 2023-03-30 ENCOUNTER — Other Ambulatory Visit: Payer: Self-pay | Admitting: Nurse Practitioner

## 2023-03-30 DIAGNOSIS — E119 Type 2 diabetes mellitus without complications: Secondary | ICD-10-CM

## 2023-03-30 NOTE — Telephone Encounter (Signed)
 Requested Prescriptions  Pending Prescriptions Disp Refills   Semaglutide,0.25 or 0.5MG /DOS, (OZEMPIC, 0.25 OR 0.5 MG/DOSE,) 2 MG/3ML SOPN [Pharmacy Med Name: OZEMPIC 0.25-0.5 MG/DOSE PEN] 3 mL 0    Sig: INJECT 0.5 MG INTO THE SKIN ONE TIME PER WEEK     Endocrinology:  Diabetes - GLP-1 Receptor Agonists - semaglutide Failed - 03/30/2023  5:29 PM      Failed - HBA1C in normal range and within 180 days    Hgb A1c MFr Bld  Date Value Ref Range Status  01/06/2023 6.8 (H) <5.7 % of total Hgb Final    Comment:    For someone without known diabetes, a hemoglobin A1c value of 6.5% or greater indicates that they may have  diabetes and this should be confirmed with a follow-up  test. . For someone with known diabetes, a value <7% indicates  that their diabetes is well controlled and a value  greater than or equal to 7% indicates suboptimal  control. A1c targets should be individualized based on  duration of diabetes, age, comorbid conditions, and  other considerations. . Currently, no consensus exists regarding use of hemoglobin A1c for diagnosis of diabetes for children. .          Failed - Cr in normal range and within 360 days    Creat  Date Value Ref Range Status  01/06/2023 1.12 (H) 0.50 - 0.99 mg/dL Final   Creatinine, Urine  Date Value Ref Range Status  10/07/2022 124 20 - 275 mg/dL Final         Passed - Valid encounter within last 6 months    Recent Outpatient Visits           2 months ago Essential hypertension   [facility] Berniece Salines, FNP   3 months ago Post-surgical hypothyroidism   [facility] Della Goo F, FNP   5 months ago New onset type 2 diabetes mellitus [facility])   [facility] Berniece Salines, FNP   5 months ago Well adult exam   [facility] Alba Cory, MD   6 months ago Morbid obesity with BMI of 40.0-44.9, adult [facility])   [facility] Alba Cory, MD       Future Appointments             In 2 weeks Alba Cory, MD [facility]   In 6 months Alba Cory, MD [facility]

## 2023-04-01 ENCOUNTER — Other Ambulatory Visit: Payer: Self-pay | Admitting: *Deleted

## 2023-04-01 ENCOUNTER — Telehealth: Payer: Self-pay

## 2023-04-01 ENCOUNTER — Telehealth: Payer: Self-pay | Admitting: *Deleted

## 2023-04-01 DIAGNOSIS — Z1211 Encounter for screening for malignant neoplasm of colon: Secondary | ICD-10-CM

## 2023-04-01 DIAGNOSIS — Z800 Family history of malignant neoplasm of digestive organs: Secondary | ICD-10-CM

## 2023-04-01 MED ORDER — NA SULFATE-K SULFATE-MG SULF 17.5-3.13-1.6 GM/177ML PO SOLN: 1.0000 | Freq: Once | ORAL | 0 refills | Status: AC

## 2023-04-01 NOTE — Telephone Encounter (Signed)
 Gastroenterology Pre-Procedure Review  Request Date: 04/26/2023 Requesting Physician: Dr. Servando Snare  PATIENT REVIEW QUESTIONS: The patient responded to the following health history questions as indicated:    1. Are you having any GI issues? no 2. Do you have a personal history of Polyps? no 3. Do you have a family history of Colon Cancer or Polyps? yes (paternal grandfather had polyps and maternal aunt had colon cancer) 4. Diabetes Mellitus? yes (taking Jardiance and Ozempic) 5. Joint replacements in the past 12 months?no 6. Major health problems in the past 3 months?no 7. Any artificial heart valves, MVP, or defibrillator?no    MEDICATIONS & ALLERGIES:    Patient reports the following regarding taking any anticoagulation/antiplatelet therapy:   Plavix, Coumadin, Eliquis, Xarelto, Lovenox, Pradaxa, Brilinta, or Effient? no Aspirin? no  Patient confirms/reports the following medications:  Current Outpatient Medications  Medication Sig Dispense Refill   Na Sulfate-K Sulfate-Mg Sulfate concentrate (SUPREP) 17.5-3.13-1.6 GM/177ML SOLN Take 1 kit (354 mLs total) by mouth once for 1 dose. 354 mL 0   Blood Glucose Monitoring Suppl DEVI 1 each by Does not apply route in the morning, at noon, and at bedtime. May substitute to any manufacturer covered by patient's insurance. (Patient not taking: Reported on 01/06/2023) 1 each 0   Cholecalciferol (VITAMIN D3) 50 MCG (2000 UT) CAPS Take 1 capsule by mouth daily at 12 noon.     docusate sodium (COLACE) 100 MG capsule Take 1 capsule (100 mg total) by mouth 2 (two) times daily. (Patient not taking: Reported on 01/06/2023) 30 capsule 0   empagliflozin (JARDIANCE) 10 MG TABS tablet Take 1 tablet (10 mg total) by mouth daily before breakfast. 90 tablet 1   escitalopram (LEXAPRO) 10 MG tablet Take 1 tablet (10 mg total) by mouth daily. 90 tablet 1   fluticasone (FLONASE) 50 MCG/ACT nasal spray Place 2 sprays into both nostrils daily. (Patient not taking: Reported  on 01/06/2023) 16 g 0   Insulin Pen Needle 32G X 6 MM MISC 1 each by Does not apply route once a week. 50 each 1   levothyroxine (SYNTHROID) 200 MCG tablet Take 1 tablet (200 mcg total) by mouth daily before breakfast. Take an extra half pill once a week 96 tablet 1   Semaglutide,0.25 or 0.5MG /DOS, (OZEMPIC, 0.25 OR 0.5 MG/DOSE,) 2 MG/3ML SOPN INJECT 0.5 MG INTO THE SKIN ONE TIME PER WEEK 3 mL 0   triamterene-hydrochlorothiazide (DYAZIDE) 37.5-25 MG capsule Take 1 each (1 capsule total) by mouth daily. 90 capsule 1   valACYclovir (VALTREX) 500 MG tablet TAKE 1 TABLET BY MOUTH 2 TIMES DAILY FOR OUTBREAKS AND ONCE DAILY FOR PREVENTION AS DIRECTED 100 tablet 1   No current facility-administered medications for this visit.    Patient confirms/reports the following allergies:  Allergies  Allergen Reactions   Hydrocodone Itching    No orders of the defined types were placed in this encounter.   AUTHORIZATION INFORMATION Primary Insurance: 1D#: Group #:  Secondary Insurance: 1D#: Group #:  SCHEDULE INFORMATION: Date: 04/26/2023  Time: Location:  [facility]

## 2023-04-01 NOTE — Telephone Encounter (Signed)
 Colonoscopy schedule on 04/26/2023 with Dr Servando Snare at [facility]

## 2023-04-01 NOTE — Telephone Encounter (Signed)
 The patient called in to schedule colonoscopy.

## 2023-04-04 ENCOUNTER — Other Ambulatory Visit: Payer: Self-pay | Admitting: Family Medicine

## 2023-04-04 DIAGNOSIS — F341 Dysthymic disorder: Secondary | ICD-10-CM

## 2023-04-04 DIAGNOSIS — I10 Essential (primary) hypertension: Secondary | ICD-10-CM

## 2023-04-04 DIAGNOSIS — F411 Generalized anxiety disorder: Secondary | ICD-10-CM

## 2023-04-08 ENCOUNTER — Other Ambulatory Visit: Payer: Self-pay | Admitting: Family Medicine

## 2023-04-08 ENCOUNTER — Telehealth: Admitting: Family Medicine

## 2023-04-08 ENCOUNTER — Encounter: Payer: Self-pay | Admitting: Family Medicine

## 2023-04-08 DIAGNOSIS — J302 Other seasonal allergic rhinitis: Secondary | ICD-10-CM

## 2023-04-08 DIAGNOSIS — J069 Acute upper respiratory infection, unspecified: Secondary | ICD-10-CM

## 2023-04-08 MED ORDER — LEVOCETIRIZINE DIHYDROCHLORIDE 5 MG PO TABS: 5.0000 mg | ORAL_TABLET | Freq: Every evening | ORAL | 1 refills | Status: DC

## 2023-04-08 MED ORDER — AZELASTINE HCL 0.1 % NA SOLN: 2.0000 | Freq: Two times a day (BID) | NASAL | 2 refills | Status: AC

## 2023-04-08 MED ORDER — MONTELUKAST SODIUM 10 MG PO TABS: 10.0000 mg | ORAL_TABLET | Freq: Every day | ORAL | 0 refills | Status: DC

## 2023-04-08 NOTE — Progress Notes (Signed)
 Name: Michelle Ware   MRN: [id number]    DOB: 1980/11/02   Date:04/08/2023       Progress Note  Subjective  Chief Complaint  Chief Complaint  Patient presents with   Nasal Congestion    congested   Cough    Started Mon   Chills    I connected with  Horris Latino  on 04/08/23 at 10:00 AM EDT by a video enabled telemedicine application and verified that I am speaking with the correct person using two identifiers.  I discussed the limitations of evaluation and management by telemedicine and the availability of in person appointments. The patient expressed understanding and agreed to proceed with a virtual visit  Staff also discussed with the patient that there may be a patient responsible charge related to this service. Patient Location: at home  Provider Location: [facility] Additional Individuals present: alone  Discussed the use of AI scribe software for clinical note transcription with the patient, who gave verbal consent to proceed.  History of Present Illness   Michelle Ware is a 43 year old female with diabetes and obesity who presents with cough and congestion.  She has been experiencing a productive cough with mucus and congestion that began earlier this week. The cough started on Monday, and by Tuesday, she developed facial pressure and congestion. She also has a sore throat, which she attributes to the coughing, and experiences occasional cold chills. No runny nose, headache, or shortness of breath.  She has seasonal allergies and  attempted to manage her symptoms with Xyzal, taking one dose yesterday. However, she has not used her usual nasal spray due to difficulties in obtaining a refill and has not tried saline spray for congestion.  She did not sleep well last night due to coughing, contributing to her feeling tired and sleepy today. She called in sick to work today.        Patient Active Problem List   Diagnosis Date Noted   Microalbuminuria 12/03/2022   New onset type 2  diabetes mellitus (HCC) 10/07/2022   Stage 3a chronic kidney disease (HCC) 06/03/2022   Gastroesophageal reflux disease without esophagitis 07/03/2021   Anxiety 07/03/2021   GAD (generalized anxiety disorder) 07/03/2021   Dysthymia 07/03/2021   History of thyroidectomy 08/05/2017   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 of myomectomy and CHTN  09/28/2016   Tenosynovitis of wrist 05/21/2016   Morbid obesity with BMI of 40.0-44.9, adult (HCC) 03/31/2016   H/O myomectomy 03/11/2015   Essential hypertension 11/28/2014   Hypertrichosis 07/24/2014   Vitamin D deficiency 07/24/2014   PCOS (polycystic ovarian syndrome) 07/24/2014   History of abnormal cervical Pap smear 11/24/2011   Genital herpes 11/29/2008    Social History   Tobacco Use   Smoking status: Never   Smokeless tobacco: Never  Substance Use Topics   Alcohol use: Yes    Comment: occasionally, about once a month     Current Outpatient Medications:    Cholecalciferol (VITAMIN D3) 50 MCG (2000 UT) CAPS, Take 1 capsule by mouth daily at 12 noon., Disp: , Rfl:    empagliflozin (JARDIANCE) 10 MG TABS tablet, Take 1 tablet (10 mg total) by mouth daily before breakfast., Disp: 90 tablet, Rfl: 1   escitalopram (LEXAPRO) 10 MG tablet, TAKE 1 TABLET BY MOUTH EVERY DAY, Disp: 30 tablet, Rfl: 0   fluticasone (FLONASE) 50 MCG/ACT nasal spray, Place 2 sprays into both nostrils daily., Disp: 16 g, Rfl: 0   Insulin  Pen Needle 32G X 6 MM MISC, 1 each by Does not apply route once a week., Disp: 50 each, Rfl: 1   levothyroxine (SYNTHROID) 200 MCG tablet, Take 1 tablet (200 mcg total) by mouth daily before breakfast. Take an extra half pill once a week, Disp: 96 tablet, Rfl: 1   Semaglutide,0.25 or 0.5MG /DOS, (OZEMPIC, 0.25 OR 0.5 MG/DOSE,) 2 MG/3ML SOPN, INJECT 0.5 MG INTO THE SKIN ONE TIME PER WEEK, Disp: 3 mL, Rfl: 0   triamterene-hydrochlorothiazide (DYAZIDE) 37.5-25 MG capsule, TAKE 1 EACH (1  CAPSULE TOTAL) BY MOUTH DAILY., Disp: 30 capsule, Rfl: 0   valACYclovir (VALTREX) 500 MG tablet, TAKE 1 TABLET BY MOUTH 2 TIMES DAILY FOR OUTBREAKS AND ONCE DAILY FOR PREVENTION AS DIRECTED, Disp: 100 tablet, Rfl: 1   Blood Glucose Monitoring Suppl DEVI, 1 each by Does not apply route in the morning, at noon, and at bedtime. May substitute to any manufacturer covered by patient's insurance. (Patient not taking: Reported on 10/09/2022), Disp: 1 each, Rfl: 0   docusate sodium (COLACE) 100 MG capsule, Take 1 capsule (100 mg total) by mouth 2 (two) times daily. (Patient not taking: Reported on 10/09/2022), Disp: 30 capsule, Rfl: 0  Allergies  Allergen Reactions   Hydrocodone Itching    I personally reviewed active problem list, medication list, allergies, family history with the patient/caregiver today.  ROS  Ten systems reviewed and is negative except as mentioned in HPI    Objective  Virtual encounter, vitals not obtained.  There is no height or weight on file to calculate BMI.  Nursing Note and Vital Signs reviewed.  Physical Exam  Awake, alert and oriented  Assessment and Plan    Upper Respiratory Infection Acute cough, congestion, facial pressure, and sore throat. Differential includes COVID-19, common cold, and allergies. COVID-19 risk due to diabetes and obesity. Treatment focused on symptom relief and preventing complications. - Advise isolation. - Encourage fluid intake, zinc, and vitamin D. - Prescribe azelastine nasal spray, two sprays morning and night. - Prescribe montelukast at night for 2-3 weeks. - Continue Xyzal OTC. - Recommend saline nasal spray for congestion. - Advise Mucinex DM for cough. - Recommend warm salted water gargles for throat. - Advise Tylenol for pain. - Instruct to monitor for high fever, shortness of breath, or worsening symptoms. - Recommend obtaining a pulse oximeter and come to be seen in pulse ox below 90 %.  Diabetes Mellitus and  Obesity Risk factors for COVID-19 complications. Monitoring respiratory symptoms is crucial. - Monitor for shortness of breath and report if it occurs.  Follow-up Requires follow-up for current symptoms and work excuse. - Provide work excuse for Thursday and Friday. - Instruct to return if symptoms worsen or concern for pneumonia.       -Red flags and when to present for emergency care or RTC including fever >101.48F, chest pain, shortness of breath, new/worsening/un-resolving symptoms,  reviewed with patient at time of visit. Follow up and care instructions discussed and provided in AVS. - I discussed the assessment and treatment plan with the patient. The patient was provided an opportunity to ask questions and all were answered. The patient agreed with the plan and demonstrated an understanding of the instructions.  I provided 15  minutes of non-face-to-face time during this encounter.  Ruel Favors, MD

## 2023-04-09 ENCOUNTER — Ambulatory Visit: Payer: 59 | Admitting: Family Medicine

## 2023-04-16 ENCOUNTER — Encounter: Payer: Self-pay | Admitting: Family Medicine

## 2023-04-16 ENCOUNTER — Ambulatory Visit: Payer: 59 | Admitting: Family Medicine

## 2023-04-16 VITALS — BP 138/76 | HR 96 | Resp 16 | Ht 70.0 in | Wt 364.7 lb

## 2023-04-16 DIAGNOSIS — I10 Essential (primary) hypertension: Secondary | ICD-10-CM

## 2023-04-16 DIAGNOSIS — R809 Proteinuria, unspecified: Secondary | ICD-10-CM | POA: Diagnosis not present

## 2023-04-16 DIAGNOSIS — G4733 Obstructive sleep apnea (adult) (pediatric): Secondary | ICD-10-CM

## 2023-04-16 DIAGNOSIS — N920 Excessive and frequent menstruation with regular cycle: Secondary | ICD-10-CM

## 2023-04-16 DIAGNOSIS — D500 Iron deficiency anemia secondary to blood loss (chronic): Secondary | ICD-10-CM

## 2023-04-16 DIAGNOSIS — F341 Dysthymic disorder: Secondary | ICD-10-CM | POA: Diagnosis not present

## 2023-04-16 DIAGNOSIS — E1129 Type 2 diabetes mellitus with other diabetic kidney complication: Secondary | ICD-10-CM | POA: Diagnosis not present

## 2023-04-16 DIAGNOSIS — J302 Other seasonal allergic rhinitis: Secondary | ICD-10-CM

## 2023-04-16 DIAGNOSIS — E890 Postprocedural hypothyroidism: Secondary | ICD-10-CM

## 2023-04-16 LAB — POCT GLYCOSYLATED HEMOGLOBIN (HGB A1C): Hemoglobin A1C: 6.3 % — AB (ref 4.0–5.6)

## 2023-04-16 MED ORDER — EMPAGLIFLOZIN 25 MG PO TABS: 25.0000 mg | ORAL_TABLET | Freq: Every day | ORAL | 1 refills | Status: DC

## 2023-04-16 MED ORDER — MONTELUKAST SODIUM 10 MG PO TABS: 10.0000 mg | ORAL_TABLET | Freq: Every day | ORAL | 0 refills | Status: DC

## 2023-04-16 MED ORDER — TRIAMTERENE-HCTZ 37.5-25 MG PO CAPS: 1.0000 | ORAL_CAPSULE | Freq: Every day | ORAL | 1 refills | Status: DC

## 2023-04-16 MED ORDER — SEMAGLUTIDE (1 MG/DOSE) 4 MG/3ML ~~LOC~~ SOPN: 1.0000 mg | PEN_INJECTOR | SUBCUTANEOUS | 0 refills | Status: DC

## 2023-04-16 MED ORDER — ESCITALOPRAM OXALATE 5 MG PO TABS: 5.0000 mg | ORAL_TABLET | Freq: Every day | ORAL | 0 refills | Status: DC

## 2023-04-16 NOTE — Addendum Note (Signed)
 Addended by: Ruel Favors on: 04/16/2023 04:38 PM   Modules accepted: Orders

## 2023-04-16 NOTE — Progress Notes (Addendum)
 Name: Michelle Ware   MRN: [id number]    DOB: 1980-12-12   Date:04/16/2023       Progress Note  Subjective  Chief Complaint  Chief Complaint  Patient presents with   Medical Management of Chronic Issues   HPI   DMII: diagnosed Sep 2024, associated with HTN, obesity, microalbuminuria , she is taking SGL2 agonist and GLP-1 agonist, , not taking statin or ARB. Discussed statin therapy . Denies polyphagia, polydipsia or polyuria    OSA: she started CPAP end of 2023 ,not compliant lately but will resume wearing it tonight    Dysthymia: doing well and anxiety resolved, wants to try coming off of lexapro, we will change dose to 5 mg to take daily for 2 weeks after that take it every other day, if recurrence of symptoms call us back and we will resume medication   HTN: bp was high when she came in but improved with rest.  She denies chest pain, dizziness, no recent episodes of palpitations . Discussed consider switching to ARB when she loses more weight. She likes diuretic since it helps with swelling    Vitamin D deficiency: she is taking vitamin D supplements    Iron deficiency anemia: she states cycles were not as heavy but since delivery back in 09/2016 cycles are heavy again  lasts 4-5 days. She states it is so heavy at night that she needs to wear depends at night . Discussed importance of stopping the heavy bleeding. She also had a myomectomy prior to her son's birth by Dr. Valentino Saxon. She is thinking about an ablation, went to another gyn closer to her house and was offered IUD but not interested.    Morbid Obesity: insurance denied medication for weight loss, and they don't cover bariatric surgery.  Highest weight  was 377 lbs sept 2024 . Eating healthier, on GLP-1 agonist since Sep and weight is down today to 364.7 lbs     GERD: she states she changed her diet eating healthier - she has a cafeteria with better options for lunch. Cook dinner at home. She is taking GLP-1 agonist but states did not  increase symptoms    Post-surgical hypothyroidism: she states she has been taking medication every morning, Last TSH  was above goal, she is  taking 200 mcg once  and we will recheck labs   Patient Active Problem List   Diagnosis Date Noted   Microalbuminuria 12/03/2022   New onset type 2 diabetes mellitus (HCC) 10/07/2022   Stage 3a chronic kidney disease (HCC) 06/03/2022   Gastroesophageal reflux disease without esophagitis 07/03/2021   Anxiety 07/03/2021   GAD (generalized anxiety disorder) 07/03/2021   Dysthymia 07/03/2021   History of thyroidectomy 08/05/2017   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 of myomectomy and CHTN  09/28/2016   Tenosynovitis of wrist 05/21/2016   Morbid obesity with BMI of 40.0-44.9, adult (HCC) 03/31/2016   H/O myomectomy 03/11/2015   Essential hypertension 11/28/2014   Hypertrichosis 07/24/2014   Vitamin D deficiency 07/24/2014   PCOS (polycystic ovarian syndrome) 07/24/2014   History of abnormal cervical Pap smear 11/24/2011   Genital herpes 11/29/2008    Past Surgical History:  Procedure Laterality Date   BIOPSY THYROID  07/2015   Dr. Tedd Sias   CESAREAN SECTION N/A 09/28/2016   Procedure: Primary CESAREAN SECTION;  Surgeon: Maxie Better, MD;  Location: [facility]IR[facility] SU[facility];  Service: Obstetrics;  Laterality: N/A;  EDD: 10/19/16 Allergy: Hydrocodone-Acetaminophen   LAPAROSCOPIC GELPORT ASSISTED MYOMECTOMY  N/A 03/11/2015   Procedure: LAPAROSCOPIC  MYOMECTOMY--attempted;  Surgeon: Hildred Laser, MD;  Location: [facility] ORS;  Service: Gynecology;  Laterality: N/A;   LAPAROTOMY N/A 03/11/2015   Procedure: LAPAROTOMY--MYOMECTOMY;  Surgeon: Hildred Laser, MD;  Location: [facility] ORS;  Service: Gynecology;  Laterality: N/A;   MOUTH SURGERY  1996   TOTAL THYROIDECTOMY Bilateral 03/05/2017   Dr. Selena Batten [facility]    Family History  Problem Relation Age of Onset   Thyroid disease Mother    Hypertension Mother    Cancer  Maternal Aunt    Cancer Maternal Grandfather    Prostate cancer Maternal Grandfather    Stroke Maternal Grandmother    Breast cancer Paternal Grandmother    Heart failure Paternal Grandfather    Liver disease Brother        Fatty Liver    Social History   Tobacco Use   Smoking status: Never   Smokeless tobacco: Never  Substance Use Topics   Alcohol use: Yes    Comment: occasionally, about once a month     Current Outpatient Medications:    azelastine (ASTELIN) 0.1 % nasal spray, Place 2 sprays into both nostrils 2 (two) times daily. Use in each nostril as directed, Disp: 30 mL, Rfl: 2   Cholecalciferol (VITAMIN D3) 50 MCG (2000 UT) CAPS, Take 1 capsule by mouth daily at 12 noon., Disp: , Rfl:    desloratadine (CLARINEX) 5 MG tablet, Take 1 tablet (5 mg total) by mouth daily., Disp: 90 tablet, Rfl: 0   escitalopram (LEXAPRO) 10 MG tablet, TAKE 1 TABLET BY MOUTH EVERY DAY, Disp: 30 tablet, Rfl: 0   fluticasone (FLONASE) 50 MCG/ACT nasal spray, Place 2 sprays into both nostrils daily., Disp: 16 g, Rfl: 0   Insulin Pen Needle 32G X 6 MM MISC, 1 each by Does not apply route once a week., Disp: 50 each, Rfl: 1   levothyroxine (SYNTHROID) 200 MCG tablet, Take 1 tablet (200 mcg total) by mouth daily before breakfast. Take an extra half pill once a week, Disp: 96 tablet, Rfl: 1   montelukast (SINGULAIR) 10 MG tablet, Take 1 tablet (10 mg total) by mouth at bedtime., Disp: 90 tablet, Rfl: 0   triamterene-hydrochlorothiazide (DYAZIDE) 37.5-25 MG capsule, TAKE 1 EACH (1 CAPSULE TOTAL) BY MOUTH DAILY., Disp: 30 capsule, Rfl: 0   valACYclovir (VALTREX) 500 MG tablet, TAKE 1 TABLET BY MOUTH 2 TIMES DAILY FOR OUTBREAKS AND ONCE DAILY FOR PREVENTION AS DIRECTED, Disp: 100 tablet, Rfl: 1   Blood Glucose Monitoring Suppl DEVI, 1 each by Does not apply route in the morning, at noon, and at bedtime. May substitute to any manufacturer covered by patient's insurance. (Patient not taking: Reported on  04/16/2023), Disp: 1 each, Rfl: 0   docusate sodium (COLACE) 100 MG capsule, Take 1 capsule (100 mg total) by mouth 2 (two) times daily. (Patient not taking: Reported on 04/16/2023), Disp: 30 capsule, Rfl: 0  Allergies  Allergen Reactions   Hydrocodone Itching    I personally reviewed active problem list, medication list, allergies with the patient/caregiver today.   ROS  Ten systems reviewed and is negative except as mentioned in HPI    Objective Physical Exam Constitutional: Patient appears well-developed and well-nourished. Obese  No distress.  HEENT: head atraumatic, normocephalic, pupils equal and reactive to light, neck supple Cardiovascular: Normal rate, regular rhythm and normal heart sounds.  No murmur heard. 1 plud  BLE edema. Pulmonary/Chest: Effort normal and breath sounds normal. No respiratory distress. Abdominal: Soft.  There is no tenderness. Psychiatric: Patient has a normal mood and affect. behavior is normal. Judgment and thought content normal.   Vitals:   04/16/23 1547 04/16/23 1559  BP: (!) 152/78 138/76  Pulse: 96   Resp: 16   SpO2: 97%   Weight: (!) 364 lb 11.2 oz (165.4 kg)   Height: 5\' 10"  (1.778 m)     Body mass index is 52.33 kg/m.  Recent Results (from the past 2160 hours)  POCT glycosylated hemoglobin (Hb A1C)     Status: Abnormal   Collection Time: 04/16/23  3:51 PM  Result Value Ref Range   Hemoglobin A1C 6.3 (A) 4.0 - 5.6 %   HbA1c POC (<> result, manual entry)     HbA1c, POC (prediabetic range)     HbA1c, POC (controlled diabetic range)      Diabetic Foot Exam:     PHQ2/9:    04/08/2023   10:00 AM 01/06/2023    3:33 PM 12/03/2022    3:06 PM 10/09/2022   10:06 AM 10/07/2022    2:11 PM  Depression screen PHQ 2/9  Decreased Interest 0 0 0 0 0  Down, Depressed, Hopeless 0  0 0 0  PHQ - 2 Score 0 0 0 0 0  Altered sleeping 0 0 0 0   Tired, decreased energy 0 1 0 0   Change in appetite 0 2 3 0   Feeling bad or failure about  yourself  0 0 0 0   Trouble concentrating 0 0 0 0   Moving slowly or fidgety/restless 0 0 0 0   Suicidal thoughts 0 0 0 0   PHQ-9 Score 0 3 3 0   Difficult doing work/chores Not difficult at all Not difficult at all Not difficult at all Not difficult at all     phq 9 is negative  Fall Risk:    01/06/2023    3:35 PM 12/03/2022    3:01 PM 10/07/2022    2:10 PM 10/02/2022   10:27 AM 09/09/2022    2:57 PM  Fall Risk   Falls in the past year? 1 1 1 1 1   Number falls in past yr: 1 1 1  0 0  Injury with Fall? 1 1 1 1  0  Risk for fall due to : History of fall(s) History of fall(s) Impaired vision Impaired balance/gait History of fall(s)  Follow up Falls prevention discussed;Education provided;Falls evaluation completed  Falls prevention discussed Falls prevention discussed;Education provided;Falls evaluation completed Education provided;Falls evaluation completed     Assessment & Plan  1. Controlled type 2 diabetes mellitus with microalbuminuria, without long-term current use of insulin (HCC) (Primary)  - POCT glycosylated hemoglobin (Hb A1C) - Semaglutide, 1 MG/DOSE, 4 MG/3ML SOPN; Inject 1 mg as directed once a week.  Dispense: 9 mL; Refill: 0  2. Morbid obesity (HCC)  Doing well on GLP-1 agonist   3. Essential hypertension  - triamterene-hydrochlorothiazide (DYAZIDE) 37.5-25 MG capsule; Take 1 each (1 capsule total) by mouth daily.  Dispense: 90 capsule; Refill: 1  4. Dysthymia  - escitalopram (LEXAPRO) 5 MG tablet; Take 1 tablet (5 mg total) by mouth daily.  Dispense: 30 tablet; Refill: 0  5. Post-surgical hypothyroidism  - TSH  6. Essential hypertension  - triamterene-hydrochlorothiazide (DYAZIDE) 37.5-25 MG capsule; Take 1 each (1 capsule total) by mouth daily.  Dispense: 90 capsule; Refill: 1  7. OSA (obstructive sleep apnea)  Resume CPAP  8. Menorrhagia with regular cycle  - Ambulatory referral to Obstetrics /  Gynecology  9. Iron deficiency anemia due to  chronic blood loss  - Ambulatory referral to Obstetrics / Gynecology

## 2023-04-19 ENCOUNTER — Telehealth: Payer: Self-pay | Admitting: *Deleted

## 2023-04-19 MED ORDER — NA SULFATE-K SULFATE-MG SULF 17.5-3.13-1.6 GM/177ML PO SOLN: 1.0000 | Freq: Once | ORAL | 0 refills | Status: AC

## 2023-04-19 NOTE — Telephone Encounter (Signed)
 Patient was transferred to my phone. Patient's colonoscopy is schedule on 04/26/2023. Patient need to reschedule because her mother is sick at the moment.  Requesting to reschedule to 05/31/2023.  Called endo unit to make the change.  Will resent Rx to [facility]  New instructions will be sent to patient with the new date.

## 2023-04-30 ENCOUNTER — Other Ambulatory Visit: Payer: Self-pay | Admitting: Family Medicine

## 2023-04-30 DIAGNOSIS — F341 Dysthymic disorder: Secondary | ICD-10-CM

## 2023-05-07 ENCOUNTER — Other Ambulatory Visit: Payer: Self-pay | Admitting: Family Medicine

## 2023-05-07 DIAGNOSIS — F341 Dysthymic disorder: Secondary | ICD-10-CM

## 2023-05-07 DIAGNOSIS — F411 Generalized anxiety disorder: Secondary | ICD-10-CM

## 2023-05-17 ENCOUNTER — Encounter: Admitting: Obstetrics & Gynecology

## 2023-05-30 ENCOUNTER — Other Ambulatory Visit: Payer: Self-pay | Admitting: Family Medicine

## 2023-05-31 ENCOUNTER — Ambulatory Visit: Admission: RE | Admit: 2023-05-31 | Source: Home / Self Care | Admitting: Gastroenterology

## 2023-05-31 SURGERY — COLONOSCOPY
Anesthesia: General

## 2023-06-08 ENCOUNTER — Ambulatory Visit: Admitting: Obstetrics & Gynecology

## 2023-06-08 ENCOUNTER — Encounter: Payer: Self-pay | Admitting: Obstetrics & Gynecology

## 2023-06-08 ENCOUNTER — Other Ambulatory Visit (HOSPITAL_COMMUNITY)
Admission: RE | Admit: 2023-06-08 | Discharge: 2023-06-08 | Disposition: A | Discharge status: Home / Self Care | Source: Ambulatory Visit | Attending: Obstetrics & Gynecology | Admitting: Obstetrics & Gynecology

## 2023-06-08 VITALS — BP 155/68 | HR 82 | Ht 70.0 in | Wt 369.4 lb

## 2023-06-08 DIAGNOSIS — N920 Excessive and frequent menstruation with regular cycle: Secondary | ICD-10-CM

## 2023-06-08 DIAGNOSIS — N888 Other specified noninflammatory disorders of cervix uteri: Secondary | ICD-10-CM | POA: Diagnosis not present

## 2023-06-08 DIAGNOSIS — D500 Iron deficiency anemia secondary to blood loss (chronic): Secondary | ICD-10-CM | POA: Diagnosis not present

## 2023-06-08 MED ORDER — SPIRONOLACTONE 50 MG PO TABS: 50.0000 mg | ORAL_TABLET | Freq: Two times a day (BID) | ORAL | 12 refills | Status: DC

## 2023-06-08 NOTE — Progress Notes (Signed)
    GYNECOLOGY PROGRESS NOTE  Subjective:    Patient ID: Michelle Ware, female    DOB: 11-May-1980, 43 y.o.   MRN: [id number]  HPI  Patient is a 43 y.o. single abstinent G2P1011 (61 yo son) here as a new patient for evaluation of menorrhagia. She has a regular period each month which last 5-7 days. It is very heavy.Her HBG has been 10 over the last year. She does not take any meds for this issue. She had a pelvic ultrasound recently which showed a "stealthy" fibroid. The endometrium was read as 4 mm. She had a myomectomy with Dr. Denman Fischer about 10 years ago.   She also has PCOS and hirsuitism. She has to shave her face regularly.  The following portions of the patient's history were reviewed and updated as appropriate: allergies, current medications, past family history, past medical history, past social history, past surgical history, and problem list.  Review of Systems Pertinent items are noted in HPI.  Last pap 2022 She reports that she has not had sex since the birth of her son.  Objective:   Blood pressure (!) 155/68, pulse 82, height 5\' 10"  (1.778 m), weight (!) 369 lb 6.4 oz (167.6 kg), last menstrual period 05/21/2023. Body mass index is 53 kg/m. Well nourished, well hydrated Black female, no apparent distress She is ambulating and conversing normally. Marked facial hair on chin/neck BMI 53 Acanthosis nigricans of inner thighs/groin noted. Patient made aware that she should improve her sugars. Long Pederson speculum was used.  Her cervix is nulliparous. Consent signed, time out done Cervix prepped with betadine and sprayed with Hurricaine spray. I then grasped with a single tooth tenaculum. Uterus sounded to 10 cm Pipelle used for 1 pass with a very large amount of tissue obtained. She tolerated the procedure well.      Assessment:   Menorrhagia Hirsuitism  Plan:  Await pathology. I will send her a message on Monday Start spironolactone 50 mg BID

## 2023-06-10 LAB — SURGICAL PATHOLOGY

## 2023-06-14 ENCOUNTER — Encounter: Payer: Self-pay | Admitting: Obstetrics & Gynecology

## 2023-06-25 ENCOUNTER — Other Ambulatory Visit: Payer: Self-pay | Admitting: Family Medicine

## 2023-06-25 NOTE — Telephone Encounter (Unsigned)
 Copied from CRM [id number]. Topic: Clinical - Medication Refill >> Jun 25, 2023  9:48 AM Everette C wrote: Medication: levothyroxine  (SYNTHROID ) 200 MCG tablet [[id number]]  Has the patient contacted their pharmacy? Yes (Agent: If no, request that the patient contact the pharmacy for the refill. If patient does not wish to contact the pharmacy document the reason why and proceed with request.) (Agent: If yes, when and what did the pharmacy advise?)  This is the patient's preferred pharmacy:  [id number] - [address] [place] [address] Phone: [phone] Fax: [phone]  Is this the correct pharmacy for this prescription? Yes If no, delete pharmacy and type the correct one.   Has the prescription been filled recently? Yes  Is the patient out of the medication? Yes  Has the patient been seen for an appointment in the last year OR does the patient have an upcoming appointment? Yes  Can we respond through MyChart? No  Agent: Please be advised that Rx refills may take up to 3 business days. We ask that you follow-up with your pharmacy.

## 2023-06-30 ENCOUNTER — Other Ambulatory Visit

## 2023-07-02 ENCOUNTER — Ambulatory Visit: Payer: Self-pay | Admitting: Family Medicine

## 2023-07-02 ENCOUNTER — Other Ambulatory Visit: Payer: Self-pay | Admitting: Family Medicine

## 2023-07-02 DIAGNOSIS — E890 Postprocedural hypothyroidism: Secondary | ICD-10-CM

## 2023-07-02 MED ORDER — LEVOTHYROXINE SODIUM 200 MCG PO TABS: 200.0000 ug | ORAL_TABLET | Freq: Every day | ORAL | 0 refills | Status: DC

## 2023-07-09 ENCOUNTER — Telehealth: Payer: Self-pay

## 2023-07-09 ENCOUNTER — Other Ambulatory Visit: Payer: Self-pay

## 2023-07-09 DIAGNOSIS — Z800 Family history of malignant neoplasm of digestive organs: Secondary | ICD-10-CM

## 2023-07-09 DIAGNOSIS — Z1211 Encounter for screening for malignant neoplasm of colon: Secondary | ICD-10-CM

## 2023-07-09 MED ORDER — NA SULFATE-K SULFATE-MG SULF 17.5-3.13-1.6 GM/177ML PO SOLN: 1.0000 | Freq: Once | ORAL | 0 refills | Status: AC

## 2023-07-09 NOTE — Telephone Encounter (Signed)
 Pt contacted office to reschedule previously canceled colonoscopy with Dr.Wohl.  Colonoscopy has been scheduled with Dr. Ole Berkeley 08/19/23 at [facility].  Instructions updated, referral updated.  Thanks,  [place], [place]

## 2023-07-28 ENCOUNTER — Encounter: Payer: Self-pay | Admitting: Family Medicine

## 2023-07-28 ENCOUNTER — Ambulatory Visit (INDEPENDENT_AMBULATORY_CARE_PROVIDER_SITE_OTHER): Admitting: Family Medicine

## 2023-07-28 VITALS — BP 148/74 | HR 97 | Resp 16 | Ht 70.0 in | Wt 373.7 lb

## 2023-07-28 DIAGNOSIS — Z7985 Long-term (current) use of injectable non-insulin antidiabetic drugs: Secondary | ICD-10-CM

## 2023-07-28 DIAGNOSIS — E1129 Type 2 diabetes mellitus with other diabetic kidney complication: Secondary | ICD-10-CM

## 2023-07-28 DIAGNOSIS — I10 Essential (primary) hypertension: Secondary | ICD-10-CM | POA: Diagnosis not present

## 2023-07-28 DIAGNOSIS — R809 Proteinuria, unspecified: Secondary | ICD-10-CM

## 2023-07-28 DIAGNOSIS — D500 Iron deficiency anemia secondary to blood loss (chronic): Secondary | ICD-10-CM

## 2023-07-28 DIAGNOSIS — E559 Vitamin D deficiency, unspecified: Secondary | ICD-10-CM

## 2023-07-28 DIAGNOSIS — G4733 Obstructive sleep apnea (adult) (pediatric): Secondary | ICD-10-CM

## 2023-07-28 DIAGNOSIS — E890 Postprocedural hypothyroidism: Secondary | ICD-10-CM | POA: Diagnosis not present

## 2023-07-28 DIAGNOSIS — E282 Polycystic ovarian syndrome: Secondary | ICD-10-CM

## 2023-07-28 LAB — POCT GLYCOSYLATED HEMOGLOBIN (HGB A1C): Hemoglobin A1C: 6.4 % — AB (ref 4.0–5.6)

## 2023-07-28 MED ORDER — DAPAGLIFLOZIN PROPANEDIOL 10 MG PO TABS: 10.0000 mg | ORAL_TABLET | Freq: Every day | ORAL | 0 refills | Status: DC

## 2023-07-28 MED ORDER — SEMAGLUTIDE (1 MG/DOSE) 4 MG/3ML ~~LOC~~ SOPN: 1.0000 mg | PEN_INJECTOR | SUBCUTANEOUS | 0 refills | Status: DC

## 2023-07-28 MED ORDER — SPIRONOLACTONE-HCTZ 50-50 MG PO TABS
1.0000 | ORAL_TABLET | Freq: Every day | ORAL | 0 refills | Status: DC
Start: 2023-07-28 — End: 2023-07-30

## 2023-07-28 NOTE — Progress Notes (Signed)
 Name: Michelle Ware   MRN: [id number]    DOB: [birth year]   Date:07/28/2023       Progress Note  Subjective  Chief Complaint  Chief Complaint  Patient presents with   Medical Management of Chronic Issues    Jardiance  is not covered by pt insurance   Discussed the use of AI scribe software for clinical note transcription with the patient, who gave verbal consent to proceed.  History of Present Illness Michelle Ware is a 42 year old female with diabetes, hypertension, and PCOS who presents for a three-month follow-up. She is accompanied by her child.  Her diabetes management shows a stable trend with her A1c at 6.4 today, slightly up from 6.3 three months ago, but improved from 6.8 six months ago and 7.2 nine months ago. She was diagnosed with diabetes in September 2024 and is currently taking Jardiance , though she has faced insurance coverage issues. She also takes Ozempic  1 mg weekly, which causes diarrhea once a day. No symptoms of hyperglycemia such as excessive hunger or thirst. She has microalbuminuria and needs to take SGL-2 agonist .  For hypertension, she takes triamterene  HCTZ daily but has not been monitoring her blood pressure at home. She was given spironolactone  by Dermatologist for facial hair due to PCOS but did not start medication yet  ( waiting to discuss it with me) No dizziness, headaches, chest pain, or palpitations.  She also has vitamin D  deficiency and iron deficiency anemia, for which she takes supplements.  She has sleep apnea and uses a CPAP machine since late 2023, which she uses most nights, resulting in less fatigue.  She has post-surgical hypothyroidism and is on thyroid  medication. A delay in prescription refills led to a gap in medication, potentially affecting her TSH levels, last recorded at 32.59. She will return for labs once taking medication consistently for 6 weeks  She has menorrhagia, which worsened after childbirth in 2018. An ultrasound last September  revealed a fibroid, and a biopsy was benign. She is exploring treatment options for her heavy periods. She has iron deficiency anemia due to bleeding and we will recheck labs   Morbid obesity, BMI is still over 35 , she is taking GLP-1 agonist for DM but helps curb her appetite. She is trying to eat smaller portions   Patient Active Problem List   Diagnosis Date Noted   Microalbuminuria 12/03/2022   New onset type 2 diabetes mellitus (HCC) 10/07/2022   Stage 3a chronic kidney disease (HCC) 06/03/2022   Gastroesophageal reflux disease without esophagitis 07/03/2021   Anxiety 07/03/2021   GAD (generalized anxiety disorder) 07/03/2021   Dysthymia 07/03/2021   History of thyroidectomy 08/05/2017   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 of myomectomy and CHTN  09/28/2016   Tenosynovitis of wrist 05/21/2016   Morbid obesity with BMI of 40.0-44.9, adult (HCC) 03/31/2016   H/O myomectomy 03/11/2015   Essential hypertension 11/28/2014   Hypertrichosis 07/24/2014   Vitamin D  deficiency 07/24/2014   PCOS (polycystic ovarian syndrome) 07/24/2014   History of abnormal cervical Pap smear 11/24/2011   Genital herpes 11/29/2008    Past Surgical History:  Procedure Laterality Date   BIOPSY THYROID   07/2015   Dr. Damian   CESAREAN SECTION N/A 09/28/2016   Procedure: Primary CESAREAN SECTION;  Surgeon: Rutherford Gain, MD;  Location: [facility];  Service: Obstetrics;  Laterality: N/A;  EDD: 10/19/16 Allergy: Hydrocodone -Acetaminophen    LAPAROSCOPIC GELPORT ASSISTED MYOMECTOMY N/A 03/11/2015   Procedure: LAPAROSCOPIC  MYOMECTOMY--attempted;  Surgeon: Archie Savers, MD;  Location: [facility] ORS;  Service: Gynecology;  Laterality: N/A;   LAPAROTOMY N/A 03/11/2015   Procedure: LAPAROTOMY--MYOMECTOMY;  Surgeon: Archie Savers, MD;  Location: [facility] ORS;  Service: Gynecology;  Laterality: N/A;   MOUTH SURGERY  1996   TOTAL THYROIDECTOMY Bilateral 03/05/2017   Dr.  [facility]    Family History  Problem Relation Age of Onset   Thyroid  disease Mother    Hypertension Mother    Cancer Maternal Aunt    Cancer Maternal Grandfather    Prostate cancer Maternal Grandfather    Stroke Maternal Grandmother    Breast cancer Paternal Grandmother    Heart failure Paternal Grandfather    Liver disease Brother        Fatty Liver    Social History   Tobacco Use   Smoking status: Never   Smokeless tobacco: Never  Substance Use Topics   Alcohol use: Yes    Comment: occasionally, about once a month     Current Outpatient Medications:    Blood Glucose Monitoring Suppl DEVI, 1 each by Does not apply route in the morning, at noon, and at bedtime. May substitute to any manufacturer covered by patient's insurance., Disp: 1 each, Rfl: 0   Cholecalciferol (VITAMIN D3) 50 MCG (2000 UT) CAPS, Take 1 capsule by mouth daily at 12 noon., Disp: , Rfl:    desloratadine (CLARINEX) 5 MG tablet, Take 1 tablet (5 mg total) by mouth daily., Disp: 90 tablet, Rfl: 0   escitalopram  (LEXAPRO ) 5 MG tablet, TAKE 1 TABLET (5 MG TOTAL) BY MOUTH DAILY., Disp: 90 tablet, Rfl: 0   Insulin  Pen Needle 32G X 6 MM MISC, 1 each by Does not apply route once a week., Disp: 50 each, Rfl: 1   levothyroxine  (SYNTHROID ) 200 MCG tablet, Take 1 tablet (200 mcg total) by mouth daily before breakfast. Take an extra half pill once a week, Disp: 96 tablet, Rfl: 0   montelukast  (SINGULAIR ) 10 MG tablet, Take 1 tablet (10 mg total) by mouth at bedtime., Disp: 90 tablet, Rfl: 0   Semaglutide , 1 MG/DOSE, 4 MG/3ML SOPN, Inject 1 mg as directed once a week., Disp: 9 mL, Rfl: 0   triamterene -hydrochlorothiazide  (DYAZIDE) 37.5-25 MG capsule, Take 1 each (1 capsule total) by mouth daily., Disp: 90 capsule, Rfl: 1   valACYclovir  (VALTREX ) 500 MG tablet, TAKE 1 TABLET BY MOUTH 2 TIMES DAILY FOR OUTBREAKS AND ONCE DAILY FOR PREVENTION AS DIRECTED, Disp: 100 tablet, Rfl: 1   azelastine  (ASTELIN ) 0.1 % nasal spray,  Place 2 sprays into both nostrils 2 (two) times daily. Use in each nostril as directed (Patient not taking: Reported on 07/28/2023), Disp: 30 mL, Rfl: 2   empagliflozin  (JARDIANCE ) 25 MG TABS tablet, Take 1 tablet (25 mg total) by mouth daily before breakfast. (Patient not taking: Reported on 07/28/2023), Disp: 90 tablet, Rfl: 1   fluticasone  (FLONASE ) 50 MCG/ACT nasal spray, Place 2 sprays into both nostrils daily. (Patient not taking: Reported on 07/28/2023), Disp: 16 g, Rfl: 0   spironolactone  (ALDACTONE ) 50 MG tablet, Take 1 tablet (50 mg total) by mouth 2 (two) times daily. Will start with 50 mg twice daily, and increase to 100 mg twice daily as needed (Patient not taking: Reported on 07/28/2023), Disp: 60 tablet, Rfl: 12  Allergies  Allergen Reactions   Hydrocodone  Itching    I personally reviewed active problem list, medication list, allergies with the patient/caregiver today.   ROS  Ten systems reviewed and  is negative except as mentioned in HPI    Objective Physical Exam CONSTITUTIONAL: Patient appears well-developed and well-nourished. No distress. HEENT: Head atraumatic, normocephalic, neck supple. CARDIOVASCULAR: Normal rate, regular rhythm and normal heart sounds. No murmur heard. No BLE edema. PULMONARY: Effort normal and breath sounds normal. Lungs clear to auscultation bilaterally. No respiratory distress. ABDOMINAL: There is no tenderness or distention. MUSCULOSKELETAL: Normal gait. Without gross motor or sensory deficit. PSYCHIATRIC: Patient has a normal mood and affect. Behavior is normal. Judgment and thought content normal.  Vitals:   07/28/23 1606 07/28/23 1622  BP: (!) 154/74 (!) 142/78  Pulse: 97   Resp: 16   SpO2: 99%   Weight: (!) 373 lb 11.2 oz (169.5 kg)   Height: 5' 10 (1.778 m)     Body mass index is 53.62 kg/m.  Recent Results (from the past 2160 hours)  Surgical pathology     Status: None   Collection Time: 06/08/23  2:09 PM  Result Value Ref Range    SURGICAL PATHOLOGY      SURGICAL PATHOLOGY CASE: [id number] PATIENT: Michelle Ware Surgical Pathology Report     Clinical History: menorrhagia with regular cycle, fibroids, BMI 53 (cm)     FINAL MICROSCOPIC DIAGNOSIS:  A. ENDOMETRIUM, BIOPSY:      Benign proliferative endometrium.      Negative for hyperplasia, atypia or malignancy.   GROSS DESCRIPTION:  Received in formalin are tan, hemorrhagic soft tissue fragments that are entirely submitted. Volume: 2.0 x 1.9 x 0.2 cm. (1 B)  (KW, 06/09/2023)  Final Diagnosis performed by Pepper Dutton, MD.   Electronically signed 06/10/2023 Technical component performed at [facility]. [facility], [address], [place], [place] [address].  Professional component performed at [facility]. [address], [place], [place] [address]  Immunohistochemistry Technical component (if applicable) was performed at [facility]. [address], [place], [place] [address].  IMMUNOHISTOCHEMI STRY DISCLAIMER (if applicable): Some of these immunohistochemical stains may have been developed and the performance characteristics determine by [facility]. Some may not have been cleared or approved by the U.S. Food and Drug Administration. The FDA has determined that such clearance or approval is not necessary. This test is used for clinical purposes. It should not be regarded as investigational or for research. This laboratory is certified under the Clinical Laboratory Improvement Amendments of 1988 (CLIA-88) as qualified to perform high complexity clinical laboratory testing.  The controls stained appropriately.   TSH     Status: Abnormal   Collection Time: 07/01/23  3:28 PM  Result Value Ref Range   TSH 32.59 (H) mIU/L    Comment:           Reference Range .           > or = 20 Years  0.40-4.50 .                Pregnancy Ranges           First trimester     0.26-2.66           Second trimester   0.55-2.73           Third trimester    0.43-2.91   POCT glycosylated hemoglobin (Hb A1C)     Status: Abnormal   Collection Time: 07/28/23  4:15 PM  Result Value Ref Range   Hemoglobin A1C 6.4 (A) 4.0 - 5.6 %   HbA1c POC (<> result, manual entry)  HbA1c, POC (prediabetic range)     HbA1c, POC (controlled diabetic range)     PHQ2/9:    07/28/2023    4:06 PM 04/08/2023   10:00 AM 01/06/2023    3:33 PM 12/03/2022    3:06 PM 10/09/2022   10:06 AM  Depression screen PHQ 2/9  Decreased Interest 0 0 0 0 0  Down, Depressed, Hopeless 0 0  0 0  PHQ - 2 Score 0 0 0 0 0  Altered sleeping 0 0 0 0 0  Tired, decreased energy 0 0 1 0 0  Change in appetite 0 0 2 3 0  Feeling bad or failure about yourself  0 0 0 0 0  Trouble concentrating 0 0 0 0 0  Moving slowly or fidgety/restless 0 0 0 0 0  Suicidal thoughts 0 0 0 0 0  PHQ-9 Score 0 0 3 3 0  Difficult doing work/chores Not difficult at all Not difficult at all Not difficult at all Not difficult at all Not difficult at all    phq 9 is negative  Fall Risk:    07/28/2023    3:56 PM 01/06/2023    3:35 PM 12/03/2022    3:01 PM 10/07/2022    2:10 PM 10/02/2022   10:27 AM  Fall Risk   Falls in the past year? 0 1 1 1 1   Number falls in past yr: 0 1 1 1  0  Injury with Fall? 0 1 1 1 1   Risk for fall due to : No Fall Risks History of fall(s) History of fall(s) Impaired vision Impaired balance/gait  Follow up Falls evaluation completed Falls prevention discussed;Education provided;Falls evaluation completed  Falls prevention discussed Falls prevention discussed;Education provided;Falls evaluation completed      Assessment & Plan Type 2 diabetes mellitus with microalbuminuria Type 2 diabetes with improved A1c from 7.2 to 6.4 over nine months. Microalbuminuria indicates kidney involvement. Insurance does not cover Jardiance , switching to Farxiga . - Discontinue Jardiance . - Prescribe Farxiga  10 mg with  voucher. - Continue Ozempic  1 mg weekly.  Hypertension Hypertension management complicated by stress and medication changes. Switching diuretics to address hypertrichosis/PCOS  and hypertension. - Discontinue triamterene  HCTZ. - Prescribe spironolactone  HCTZ 50/50 mg twice daily. - Monitor blood pressure at home.  Polycystic ovary syndrome with hypertrichosis PCOS with hypertrichosis. Spironolactone  ( given by dermatologist but not started yet) recommended for hypertrichosis and hypertension. - Prescribe spironolactone  HCTZ 50/50 mg twice daily.  Post-surgical hypothyroidism Suboptimal control of hypothyroidism with TSH of 32.59 due to medication adherence issues. - Ensure daily thyroid  medication adherence. - Recheck TSH after six weeks of consistent medication use.  Menorrhagia with uterine fibroid Menorrhagia with benign uterine fibroid biopsy. Concerns about IUD due to family history and weight concerns. - Discuss potential IUD placement if bleeding continues. - Monitor menstrual bleeding and consider further intervention if necessary.  Iron deficiency anemia Iron deficiency anemia likely secondary to menorrhagia with hemoglobin of 10.4. - Order CBC and iron studies at next visit.  Obstructive sleep apnea Obstructive sleep apnea managed with CPAP, no significant change in fatigue. - Continue CPAP therapy.  Generalized anxiety disorder Generalized anxiety disorder with recent work stress. Currently on Lexapro  5 mg, advised to continue due to stress. - Continue Lexapro  5 mg daily. - Consider tapering off Lexapro  if stress decreases, halve dose for 4-5 days before stopping.  Vitamin D  deficiency Vitamin D  deficiency managed with supplementation. - Continue vitamin D  supplementation.  Morbid obesity Discussed with the patient the  risk posed by an increased BMI. Discussed importance of portion control, calorie counting and at least 150 minutes of physical activity weekly.  Avoid sweet beverages and drink more water. Eat at least 6 servings of fruit and vegetables daily

## 2023-07-29 ENCOUNTER — Telehealth: Payer: Self-pay | Admitting: Pharmacy Technician

## 2023-07-29 ENCOUNTER — Other Ambulatory Visit (HOSPITAL_COMMUNITY): Payer: Self-pay

## 2023-07-29 NOTE — Telephone Encounter (Signed)
 Pharmacy Patient Advocate Encounter   Received notification from CoverMyMeds that prior authorization for Farxiga  10MG  tablets is required/requested.   Insurance verification completed.   The patient is insured through [facility] .   Per test claim: PA required; PA submitted to above mentioned insurance via CoverMyMeds Key/confirmation #/EOC [id number] Status is pending

## 2023-07-30 ENCOUNTER — Other Ambulatory Visit (HOSPITAL_COMMUNITY): Payer: Self-pay

## 2023-07-30 ENCOUNTER — Other Ambulatory Visit: Payer: Self-pay | Admitting: Family Medicine

## 2023-07-30 DIAGNOSIS — I10 Essential (primary) hypertension: Secondary | ICD-10-CM

## 2023-07-30 DIAGNOSIS — E282 Polycystic ovarian syndrome: Secondary | ICD-10-CM

## 2023-07-30 MED ORDER — SPIRONOLACTONE-HCTZ 25-25 MG PO TABS: 1.0000 | ORAL_TABLET | Freq: Two times a day (BID) | ORAL | 0 refills | Status: DC

## 2023-07-30 NOTE — Telephone Encounter (Signed)
 Pharmacy Patient Advocate Encounter  Received notification from [facility] that Prior Authorization for Farxiga  10MG  tablets has been APPROVED from 07/29/23 to 07/28/24. Unable to obtain price due to refill too soon rejection, last fill date 07/29/23 next available fill date09/20/25   PA #/Case ID/Reference #: [id number]

## 2023-08-01 ENCOUNTER — Other Ambulatory Visit: Payer: Self-pay | Admitting: Family Medicine

## 2023-08-01 DIAGNOSIS — F341 Dysthymic disorder: Secondary | ICD-10-CM

## 2023-08-02 ENCOUNTER — Other Ambulatory Visit (HOSPITAL_COMMUNITY): Payer: Self-pay

## 2023-08-17 ENCOUNTER — Telehealth: Payer: Self-pay

## 2023-08-17 NOTE — Telephone Encounter (Signed)
 Patient called to cancel her procedure scheduled on 08/19/2023. She stated that she was not able to make it and was not able to reschedule it at this time. I then called the endo unit and had to leave a detailed voicemail letting them know that the patient just called to cancel her procedure.

## 2023-08-19 ENCOUNTER — Ambulatory Visit: Admission: RE | Admit: 2023-08-19 | Source: Home / Self Care | Admitting: Gastroenterology

## 2023-08-19 SURGERY — COLONOSCOPY
Anesthesia: General

## 2023-09-15 ENCOUNTER — Ambulatory Visit: Admission: EM | Admit: 2023-09-15 | Discharge: 2023-09-15 | Disposition: A | Discharge status: Home / Self Care

## 2023-09-15 DIAGNOSIS — Z711 Person with feared health complaint in whom no diagnosis is made: Secondary | ICD-10-CM

## 2023-09-15 NOTE — ED Provider Notes (Signed)
 [facility]    CSN: [id number] Arrival date & time: 09/15/23  0806      History   Chief Complaint Chief Complaint  Patient presents with   Hand Problem    HPI Michelle Ware is a 43 y.o. female.   HPI  43 year old female with past medical history significant for anemia, dermatophytosis of the foot, fibroids, goiter, HSV-2, hypertension, and obesity presents for evaluation of a crushing injury sustained yesterday to her right hand.  She reports that a lady was rushing by her that she felt something scratch her hand.  She did not see any blood drawn but she noticed a nasty streak on her hand.  That actually streak has resolved.  She is not experiencing fever, pain, numbness, or tingling.  Past Medical History:  Diagnosis Date   Anemia    Dermatophytosis of foot    Fibroid    Goiter    Gross hematuria    Herpes, genital    Hypertension    Low grade squamous intraepithelial lesion (LGSIL)    Morbid obesity with BMI of 40.0-44.9, adult (HCC)    Polycystic ovarian syndrome    Vaginal Pap smear, abnormal    Vitamin D  deficiency     Patient Active Problem List   Diagnosis Date Noted   OSA on CPAP 07/28/2023   Iron deficiency anemia due to chronic blood loss 07/28/2023   Microalbuminuria 12/03/2022   Controlled type 2 diabetes mellitus with microalbuminuria, without long-term current use of insulin  (HCC) 10/07/2022   Stage 3a chronic kidney disease (HCC) 06/03/2022   Gastroesophageal reflux disease without esophagitis 07/03/2021   Anxiety 07/03/2021   GAD (generalized anxiety disorder) 07/03/2021   Dysthymia 07/03/2021   History of thyroidectomy 08/05/2017   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 of myomectomy and CHTN  09/28/2016   Tenosynovitis of wrist 05/21/2016   Morbid obesity (HCC) 03/31/2016   H/O myomectomy 03/11/2015   Essential hypertension 11/28/2014   Hypertrichosis 07/24/2014   Vitamin D  deficiency  07/24/2014   PCOS (polycystic ovarian syndrome) 07/24/2014   History of abnormal cervical Pap smear 11/24/2011   Genital herpes 11/29/2008    Past Surgical History:  Procedure Laterality Date   BIOPSY THYROID   07/2015   Dr. Damian   CESAREAN SECTION N/A 09/28/2016   Procedure: Primary CESAREAN SECTION;  Surgeon: Rutherford Gain, MD;  Location: [facility];  Service: Obstetrics;  Laterality: N/A;  EDD: 10/19/16 Allergy: Hydrocodone -Acetaminophen    LAPAROSCOPIC GELPORT ASSISTED MYOMECTOMY N/A 03/11/2015   Procedure: LAPAROSCOPIC  MYOMECTOMY--attempted;  Surgeon: Archie Savers, MD;  Location: [facility] ORS;  Service: Gynecology;  Laterality: N/A;   LAPAROTOMY N/A 03/11/2015   Procedure: LAPAROTOMY--MYOMECTOMY;  Surgeon: Archie Savers, MD;  Location: [facility] ORS;  Service: Gynecology;  Laterality: N/A;   MOUTH SURGERY  1996   TOTAL THYROIDECTOMY Bilateral 03/05/2017   Dr. Luke - [facility]    OB History     Gravida  2   Para  1   Term  1   Preterm      AB  1   Living  1      SAB  1   IAB      Ectopic      Multiple  0   Live Births  1        Obstetric Comments  1st Menstrual Cycle:  11           Home Medications    Prior to Admission medications   Medication  Sig Start Date End Date Taking? Authorizing Provider  escitalopram  (LEXAPRO ) 5 MG tablet TAKE 1 TABLET (5 MG TOTAL) BY MOUTH DAILY. 08/02/23  Yes Sowles, Krichna, MD  levothyroxine  (SYNTHROID ) 200 MCG tablet Take 1 tablet (200 mcg total) by mouth daily before breakfast. Take an extra half pill once a week 07/02/23  Yes Sowles, Krichna, MD  Semaglutide , 1 MG/DOSE, 4 MG/3ML SOPN Inject 1 mg as directed once a week. 07/28/23  Yes Sowles, Krichna, MD  triamterene -hydrochlorothiazide  (DYAZIDE) 37.5-25 MG capsule SMARTSIG:1 Each By Mouth Daily 08/01/23  Yes [provider]  azelastine  (ASTELIN ) 0.1 % nasal spray Place 2 sprays into both nostrils 2 (two) times daily. Use in each nostril as directed Patient not  taking: Reported on 07/28/2023 04/08/23   Sowles, Krichna, MD  Blood Glucose Monitoring Suppl DEVI 1 each by Does not apply route in the morning, at noon, and at bedtime. May substitute to any manufacturer covered by patient's insurance. 10/07/22   Pender, Julie F, FNP  Cholecalciferol (VITAMIN D3) 50 MCG (2000 UT) CAPS Take 1 capsule by mouth daily at 12 noon.    [provider]  dapagliflozin  propanediol (FARXIGA ) 10 MG TABS tablet Take 1 tablet (10 mg total) by mouth daily before breakfast. 07/28/23   Glenard, Krichna, MD  desloratadine (CLARINEX) 5 MG tablet Take 1 tablet (5 mg total) by mouth daily. 04/09/23   Sowles, Krichna, MD  fluticasone  (FLONASE ) 50 MCG/ACT nasal spray Place 2 sprays into both nostrils daily. Patient not taking: Reported on 07/28/2023 01/05/22   Sowles, Krichna, MD  Insulin  Pen Needle 32G X 6 MM MISC 1 each by Does not apply route once a week. 12/03/22   Gareth Mliss FALCON, FNP  montelukast  (SINGULAIR ) 10 MG tablet Take 1 tablet (10 mg total) by mouth at bedtime. 04/16/23   Sowles, Krichna, MD  spironolactone -hydrochlorothiazide  (ALDACTAZIDE) 25-25 MG tablet Take 1 tablet by mouth 2 (two) times daily. 07/30/23   Sowles, Krichna, MD  valACYclovir  (VALTREX ) 500 MG tablet TAKE 1 TABLET BY MOUTH 2 TIMES DAILY FOR OUTBREAKS AND ONCE DAILY FOR PREVENTION AS DIRECTED 09/23/22   Sowles, Krichna, MD    Family History Family History  Problem Relation Age of Onset   Thyroid  disease Mother    Hypertension Mother    Cancer Maternal Aunt    Cancer Maternal Grandfather    Prostate cancer Maternal Grandfather    Stroke Maternal Grandmother    Breast cancer Paternal Grandmother    Heart failure Paternal Grandfather    Liver disease Brother        Fatty Liver    Social History Social History   Tobacco Use   Smoking status: Never   Smokeless tobacco: Never  Vaping Use   Vaping status: Never Used  Substance Use Topics   Alcohol use: Yes    Comment: occasionally, about once a  month   Drug use: No     Allergies   Hydrocodone    Review of Systems Review of Systems  Constitutional:  Negative for fever.  Musculoskeletal:  Negative for arthralgias and joint swelling.  Skin:  Negative for color change and wound.  Neurological:  Negative for numbness.     Physical Exam Triage Vital Signs ED Triage Vitals  Encounter Vitals Group     BP      Girls Systolic BP Percentile      Girls Diastolic BP Percentile      Boys Systolic BP Percentile      Boys Diastolic BP Percentile  Pulse      Resp      Temp      Temp src      SpO2      Weight      Height      Head Circumference      Peak Flow      Pain Score      Pain Loc      Pain Education      Exclude from Growth Chart    No data found.  Updated Vital Signs BP (!) 185/99 (BP Location: Right Wrist)   Pulse 73   Temp 98.4 F (36.9 C) (Oral)   Resp 17   Wt (!) 350 lb (158.8 kg)   LMP 08/18/2023 (Exact Date)   SpO2 96%   BMI 50.22 kg/m   Visual Acuity Right Eye Distance:   Left Eye Distance:   Bilateral Distance:    Right Eye Near:   Left Eye Near:    Bilateral Near:     Physical Exam Vitals and nursing note reviewed.  Constitutional:      Appearance: Normal appearance. She is not ill-appearing.  HENT:     Head: Normocephalic and atraumatic.  Musculoskeletal:        General: No swelling, tenderness, deformity or signs of injury. Normal range of motion.  Skin:    General: Skin is warm and dry.     Capillary Refill: Capillary refill takes less than 2 seconds.     Findings: No erythema or rash.  Neurological:     General: No focal deficit present.     Mental Status: She is alert and oriented to person, place, and time.      UC Treatments / Results  Labs (all labs ordered are listed, but only abnormal results are displayed) Labs Reviewed - No data to display  EKG   Radiology No results found.  Procedures Procedures (including critical care time)  Medications  Ordered in UC Medications - No data to display  Initial Impression / Assessment and Plan / UC Course  I have reviewed the triage vital signs and the nursing notes.  Pertinent labs & imaging results that were available during my care of the patient were reviewed by me and considered in my medical decision making (see chart for details).   Patient is a pleasant, nontoxic-appearing 43 year old female presenting for evaluation of a crushing injury she sustained to her right index finger as outlined in the HPI above.  As you can see in the image above, there is no evidence of injury to the patient's finger.  She reports that she did not see any blood at the time of the brushing contact and she is not sure what she came in contact with.  She is reporting that a lady walked [place] and something in her hand scratched her.  She was concerned that she may have been stuck with a needle.  She denies seeing any blood at the time of the injury and there is no indication of a puncture wound, abrasion, or superficial laceration present.  She is not experiencing any pain, fever, redness, or drainage.  I have advised the patient that her skin is intact and the likelihood of her contracting anything is extremely low.  Given that she did not see any blood drawn there is no evidence of puncture wound the possibility of a needlestick is 0.  I have advised her that if she develops any redness, swelling, joint pain, red streaks, or fever  she can return for reevaluation.  Work note provided.   Final Clinical Impressions(s) / UC Diagnoses   Final diagnoses:  Physically well but worried     Discharge Instructions      As we discussed, there is no evidence of injury to your right hand and no evidence of puncture wound to suggest possible needlestick.  Also, given that you did not see any blood type of injury, the possibility of a needlestick or deep injury is very unlikely.  Monitor the area for any changes in color,  swelling, redness, red streaks, or if you develop a fever.  If these conditions arise please return for reevaluation.     ED Prescriptions   None    PDMP not reviewed this encounter.   Bernardino Ditch, NP 09/15/23 [id number]

## 2023-09-15 NOTE — Discharge Instructions (Addendum)
 As we discussed, there is no evidence of injury to your right hand and no evidence of puncture wound to suggest possible needlestick.  Also, given that you did not see any blood type of injury, the possibility of a needlestick or deep injury is very unlikely.  Monitor the area for any changes in color, swelling, redness, red streaks, or if you develop a fever.  If these conditions arise please return for reevaluation.

## 2023-09-15 NOTE — ED Triage Notes (Signed)
 Patient states that she was passing by someone yesterday and something hit her hand out of the ladys hand, patient states that it felt like something scratched her. Right hand.

## 2023-09-21 DIAGNOSIS — A630 Anogenital (venereal) warts: Secondary | ICD-10-CM | POA: Insufficient documentation

## 2023-09-27 ENCOUNTER — Other Ambulatory Visit: Payer: Self-pay | Admitting: Family Medicine

## 2023-10-07 NOTE — Patient Instructions (Signed)
 Preventive Care 58-45 Years Old, Female  Preventive care refers to lifestyle choices and visits with your health care provider that can promote health and wellness. Preventive care visits are also called wellness exams.  What can I expect for my preventive care visit?  Counseling  Your health care provider may ask you questions about your:  Medical history, including:  Past medical problems.  Family medical history.  Pregnancy history.  Current health, including:  Menstrual cycle.  Method of birth control.  Emotional well-being.  Home life and relationship well-being.  Sexual activity and sexual health.  Lifestyle, including:  Alcohol, nicotine or tobacco, and drug use.  Access to firearms.  Diet, exercise, and sleep habits.  Work and work Astronomer.  Sunscreen use.  Safety issues such as seatbelt and bike helmet use.  Physical exam  Your health care provider will check your:  Height and weight. These may be used to calculate your BMI (body mass index). BMI is a measurement that tells if you are at a healthy weight.  Waist circumference. This measures the distance around your waistline. This measurement also tells if you are at a healthy weight and may help predict your risk of certain diseases, such as type 2 diabetes and high blood pressure.  Heart rate and blood pressure.  Body temperature.  Skin for abnormal spots.  What immunizations do I need?    Vaccines are usually given at various ages, according to a schedule. Your health care provider will recommend vaccines for you based on your age, medical history, and lifestyle or other factors, such as travel or where you work.  What tests do I need?  Screening  Your health care provider may recommend screening tests for certain conditions. This may include:  Lipid and cholesterol levels.  Diabetes screening. This is done by checking your blood sugar (glucose) after you have not eaten for a while (fasting).  Pelvic exam and Pap test.  Hepatitis B test.  Hepatitis C  test.  HIV (human immunodeficiency virus) test.  STI (sexually transmitted infection) testing, if you are at risk.  Lung cancer screening.  Colorectal cancer screening.  Mammogram. Talk with your health care provider about when you should start having regular mammograms. This may depend on whether you have a family history of breast cancer.  BRCA-related cancer screening. This may be done if you have a family history of breast, ovarian, tubal, or peritoneal cancers.  Bone density scan. This is done to screen for osteoporosis.  Talk with your health care provider about your test results, treatment options, and if necessary, the need for more tests.  Follow these instructions at home:  Eating and drinking    Eat a diet that includes fresh fruits and vegetables, whole grains, lean protein, and low-fat dairy products.  Take vitamin and mineral supplements as recommended by your health care provider.  Do not drink alcohol if:  Your health care provider tells you not to drink.  You are pregnant, may be pregnant, or are planning to become pregnant.  If you drink alcohol:  Limit how much you have to 0-1 drink a day.  Know how much alcohol is in your drink. In the [place] one drink equals one 12 oz bottle of beer (355 mL), one 5 oz glass of wine (148 mL), or one 1 oz glass of hard liquor (44 mL).  Lifestyle  Brush your teeth every morning and night with fluoride toothpaste. Floss one time each day.  Exercise for at least  30 minutes 5 or more days each week.  Do not use any products that contain nicotine or tobacco. These products include cigarettes, chewing tobacco, and vaping devices, such as e-cigarettes. If you need help quitting, ask your health care provider.  Do not use drugs.  If you are sexually active, practice safe sex. Use a condom or other form of protection to prevent STIs.  If you do not wish to become pregnant, use a form of birth control. If you plan to become pregnant, see your health care provider for a  prepregnancy visit.  Take aspirin only as told by your health care provider. Make sure that you understand how much to take and what form to take. Work with your health care provider to find out whether it is safe and beneficial for you to take aspirin daily.  Find healthy ways to manage stress, such as:  Meditation, yoga, or listening to music.  Journaling.  Talking to a trusted person.  Spending time with friends and family.  Minimize exposure to UV radiation to reduce your risk of skin cancer.  Safety  Always wear your seat belt while driving or riding in a vehicle.  Do not drive:  If you have been drinking alcohol. Do not ride with someone who has been drinking.  When you are tired or distracted.  While texting.  If you have been using any mind-altering substances or drugs.  Wear a helmet and other protective equipment during sports activities.  If you have firearms in your house, make sure you follow all gun safety procedures.  Seek help if you have been physically or sexually abused.  What's next?  Visit your health care provider once a year for an annual wellness visit.  Ask your health care provider how often you should have your eyes and teeth checked.  Stay up to date on all vaccines.  This information is not intended to replace advice given to you by your health care provider. Make sure you discuss any questions you have with your health care provider.  Document Revised: 07/03/2020 Document Reviewed: 07/03/2020  Elsevier Patient Education  2024 [employer].

## 2023-10-08 ENCOUNTER — Ambulatory Visit: Payer: Self-pay | Admitting: Family Medicine

## 2023-10-08 ENCOUNTER — Encounter: Payer: Self-pay | Admitting: Family Medicine

## 2023-10-08 VITALS — BP 138/82 | HR 86 | Resp 16 | Ht 70.0 in | Wt 373.7 lb

## 2023-10-08 DIAGNOSIS — E1129 Type 2 diabetes mellitus with other diabetic kidney complication: Secondary | ICD-10-CM

## 2023-10-08 DIAGNOSIS — Z23 Encounter for immunization: Secondary | ICD-10-CM | POA: Diagnosis not present

## 2023-10-08 DIAGNOSIS — Z1231 Encounter for screening mammogram for malignant neoplasm of breast: Secondary | ICD-10-CM

## 2023-10-08 DIAGNOSIS — R809 Proteinuria, unspecified: Secondary | ICD-10-CM

## 2023-10-08 DIAGNOSIS — Z0000 Encounter for general adult medical examination without abnormal findings: Secondary | ICD-10-CM

## 2023-10-08 DIAGNOSIS — Z0001 Encounter for general adult medical examination with abnormal findings: Secondary | ICD-10-CM

## 2023-10-08 MED ORDER — EMPAGLIFLOZIN 25 MG PO TABS: 25.0000 mg | ORAL_TABLET | Freq: Every day | ORAL | 0 refills | Status: DC

## 2023-10-08 NOTE — Progress Notes (Signed)
 Name: Michelle Ware   MRN: [id number]    DOB: 05/22/1980   Date:10/08/2023       Progress Note  Subjective  Chief Complaint  Chief Complaint  Patient presents with   Annual Exam    HPI  Patient presents for annual CPE.  Diet: she has not been compliant with her diet lately  Exercise:  discussed regular physical activity  Last Eye Exam: encouraged to complete Last Dental Exam: completed  Flowsheet Row Office Visit from 10/08/2023 in [facility]  AUDIT-C Score 0   Depression: Phq 9 is  negative    10/08/2023   10:07 AM 07/28/2023    4:06 PM 04/08/2023   10:00 AM 01/06/2023    3:33 PM 12/03/2022    3:06 PM  Depression screen PHQ 2/9  Decreased Interest 0 0 0 0 0  Down, Depressed, Hopeless 0 0 0  0  PHQ - 2 Score 0 0 0 0 0  Altered sleeping  0 0 0 0  Tired, decreased energy  0 0 1 0  Change in appetite  0 0 2 3  Feeling bad or failure about yourself   0 0 0 0  Trouble concentrating  0 0 0 0  Moving slowly or fidgety/restless  0 0 0 0  Suicidal thoughts  0 0 0 0  PHQ-9 Score  0 0 3 3  Difficult doing work/chores  Not difficult at all Not difficult at all Not difficult at all Not difficult at all   Hypertension: BP Readings from Last 3 Encounters:  10/08/23 138/82  09/15/23 (!) 185/99  07/28/23 (!) 148/74   Obesity: Wt Readings from Last 3 Encounters:  10/08/23 (!) 373 lb 11.2 oz (169.5 kg)  09/15/23 (!) 350 lb (158.8 kg)  07/28/23 (!) 373 lb 11.2 oz (169.5 kg)   BMI Readings from Last 3 Encounters:  10/08/23 53.62 kg/m  09/15/23 50.22 kg/m  07/28/23 53.62 kg/m     Vaccines: reviewed with the patient. PCV 20 today, flu shot at work   Hep C Screening: completed STD testing and prevention (HIV/chl/gon/syphilis): N/A Intimate partner violence: negative screen  Sexual History :not currently sexually active  Menstrual History/LMP/Abnormal Bleeding: she had an endometrial biopsy by gyn, still has a heavy cycle but doing a little better  now. She was advised to have an IUD but she is scared  Discussed importance of follow up if any post-menopausal bleeding: not applicable  Incontinence Symptoms: very seldom has urinary urgency   Breast cancer:  - Last Mammogram: she will schedule it  - BRCA gene screening: paternal grandmother had breast cancer, N/A  Osteoporosis Prevention : Discussed high calcium and vitamin D  supplementation, weight bearing exercises Bone density :not applicable   Cervical cancer screening: up-to-date  Skin cancer: Discussed monitoring for atypical lesions  Colorectal cancer: up to date    Lung cancer:  Low Dose CT Chest recommended if Age 2-80 years, 20 pack-year currently smoking OR have quit w/in 15years. Patient does not qualify for screen   ECG: 2022   Advanced Care Planning: A voluntary discussion about advance care planning including the explanation and discussion of advance directives.  Discussed health care proxy and Living will, and the patient was able to identify a health care proxy as parents.  Patient does not have a living will and power of attorney of health care   Diabetic Foot Exam - Simple   Simple Foot Form Visual Inspection See comments: Yes Sensation Testing Intact to  touch and monofilament testing bilaterally: Yes Pulse Check Posterior Tibialis and Dorsalis pulse intact bilaterally: Yes Comments Dry skin Both first toenails removed by podiatrist and not growing properly since ( per patient)      Patient Active Problem List   Diagnosis Date Noted   Condyloma 09/21/2023   OSA on CPAP 07/28/2023   Iron deficiency anemia due to chronic blood loss 07/28/2023   Microalbuminuria 12/03/2022   Controlled type 2 diabetes mellitus with microalbuminuria, without long-term current use of insulin  (HCC) 10/07/2022   Stage 3a chronic kidney disease (HCC) 06/03/2022   Gastroesophageal reflux disease without esophagitis 07/03/2021   Anxiety 07/03/2021   GAD (generalized anxiety  disorder) 07/03/2021   Dysthymia 07/03/2021   History of thyroidectomy 08/05/2017   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 of myomectomy and CHTN  09/28/2016   Tenosynovitis of wrist 05/21/2016   Morbid obesity (HCC) 03/31/2016   H/O myomectomy 03/11/2015   Essential hypertension 11/28/2014   Hypertrichosis 07/24/2014   Vitamin D  deficiency 07/24/2014   PCOS (polycystic ovarian syndrome) 07/24/2014   History of abnormal cervical Pap smear 11/24/2011   Genital herpes 11/29/2008    Past Surgical History:  Procedure Laterality Date   BIOPSY THYROID   07/2015   Dr. Damian   CESAREAN SECTION N/A 09/28/2016   Procedure: Primary CESAREAN SECTION;  Surgeon: Rutherford Gain, MD;  Location: [facility];  Service: Obstetrics;  Laterality: N/A;  EDD: 10/19/16 Allergy: Hydrocodone -Acetaminophen    LAPAROSCOPIC GELPORT ASSISTED MYOMECTOMY N/A 03/11/2015   Procedure: LAPAROSCOPIC  MYOMECTOMY--attempted;  Surgeon: Archie Savers, MD;  Location: [facility] ORS;  Service: Gynecology;  Laterality: N/A;   LAPAROTOMY N/A 03/11/2015   Procedure: LAPAROTOMY--MYOMECTOMY;  Surgeon: Archie Savers, MD;  Location: [facility] ORS;  Service: Gynecology;  Laterality: N/A;   MOUTH SURGERY  1996   TOTAL THYROIDECTOMY Bilateral 03/05/2017   Dr. [facility]    Family History  Problem Relation Age of Onset   Thyroid  disease Mother    Hypertension Mother    Cancer Maternal Aunt    Cancer Maternal Grandfather    Prostate cancer Maternal Grandfather    Stroke Maternal Grandmother    Breast cancer Paternal Grandmother    Heart failure Paternal Grandfather    Liver disease Brother        Fatty Liver    Social History   Socioeconomic History   Marital status: Single    Spouse name: Not on file   Number of children: 1   Years of education: Not on file   Highest education level: Bachelor's degree (e.g., BA, AB, BS)  Occupational History   Occupation: Location manager     Comment: works for [employer] in [employer]  Tobacco Use   Smoking status: Never   Smokeless tobacco: Never  Vaping Use   Vaping status: Never Used  Substance and Sexual Activity   Alcohol use: Yes    Comment: occasionally, about once a month   Drug use: No   Sexual activity: Not Currently    Partners: Male    Birth control/protection: Condom    Comment: last active 2019  Other Topics Concern   Not on file  Social History Narrative   Lives by herself, broke up with Gaither, but they had a son 09/28/2016 and he is involved in their son's life   Working full time at [employer] ( cigarette company), works at [employer] side.   Social Drivers of Health   Financial Resource Strain: Low Risk  (10/08/2023)  Overall Financial Resource Strain (CARDIA)    Difficulty of Paying Living Expenses: Not hard at all  Food Insecurity: No Food Insecurity (10/08/2023)   Hunger Vital Sign    Worried About Running Out of Food in the Last Year: Never true    Ran Out of Food in the Last Year: Never true  Transportation Needs: No Transportation Needs (10/08/2023)   PRAPARE - Administrator, Civil Service (Medical): No    Lack of Transportation (Non-Medical): No  Physical Activity: Inactive (10/08/2023)   Exercise Vital Sign    Days of Exercise per Week: 0 days    Minutes of Exercise per Session: 0 min  Stress: No Stress Concern Present (10/08/2023)   [employer] of Occupational Health - Occupational Stress Questionnaire    Feeling of Stress: Not at all  Social Connections: Moderately Isolated (10/08/2023)   Social Connection and Isolation Panel    Frequency of Communication with Friends and Family: More than three times a week    Frequency of Social Gatherings with Friends and Family: More than three times a week    Attends Religious Services: More than 4 times per year    Active Member of [employer] or Organizations: No    Attends Banker Meetings: Never    Marital Status: Never  married  Intimate Partner Violence: Not At Risk (10/08/2023)   Humiliation, Afraid, Rape, and Kick questionnaire    Fear of Current or Ex-Partner: No    Emotionally Abused: No    Physically Abused: No    Sexually Abused: No     Current Outpatient Medications:    Blood Glucose Monitoring Suppl DEVI, 1 each by Does not apply route in the morning, at noon, and at bedtime. May substitute to any manufacturer covered by patient's insurance., Disp: 1 each, Rfl: 0   Cholecalciferol (VITAMIN D3) 50 MCG (2000 UT) CAPS, Take 1 capsule by mouth daily at 12 noon., Disp: , Rfl:    desloratadine (CLARINEX) 5 MG tablet, Take 1 tablet (5 mg total) by mouth daily., Disp: 90 tablet, Rfl: 0   empagliflozin  (JARDIANCE ) 25 MG TABS tablet, Take 1 tablet (25 mg total) by mouth daily., Disp: 90 tablet, Rfl: 0   escitalopram  (LEXAPRO ) 5 MG tablet, TAKE 1 TABLET (5 MG TOTAL) BY MOUTH DAILY., Disp: 90 tablet, Rfl: 0   levothyroxine  (SYNTHROID ) 200 MCG tablet, Take 1 tablet (200 mcg total) by mouth daily before breakfast. Take an extra half pill once a week, Disp: 96 tablet, Rfl: 0   montelukast  (SINGULAIR ) 10 MG tablet, Take 1 tablet (10 mg total) by mouth at bedtime., Disp: 90 tablet, Rfl: 0   triamterene -hydrochlorothiazide  (DYAZIDE) 37.5-25 MG capsule, SMARTSIG:1 Each By Mouth Daily, Disp: , Rfl:    valACYclovir  (VALTREX ) 500 MG tablet, TAKE 1 TABLET BY MOUTH 2 TIMES DAILY FOR OUTBREAKS AND ONCE DAILY FOR PREVENTION AS DIRECTED, Disp: 100 tablet, Rfl: 1   azelastine  (ASTELIN ) 0.1 % nasal spray, Place 2 sprays into both nostrils 2 (two) times daily. Use in each nostril as directed (Patient not taking: Reported on 10/08/2023), Disp: 30 mL, Rfl: 2   fluticasone  (FLONASE ) 50 MCG/ACT nasal spray, Place 2 sprays into both nostrils daily. (Patient not taking: Reported on 10/08/2023), Disp: 16 g, Rfl: 0   Semaglutide , 1 MG/DOSE, 4 MG/3ML SOPN, Inject 1 mg as directed once a week. (Patient not taking: Reported on 10/08/2023), Disp:  9 mL, Rfl: 0  Allergies  Allergen Reactions   Hydrocodone  Itching  ROS  Constitutional: Negative for fever or weight change.  Respiratory: Negative for cough and shortness of breath.   Cardiovascular: Negative for chest pain or palpitations.  Gastrointestinal: Negative for abdominal pain, no bowel changes.  Musculoskeletal: Negative for gait problem or joint swelling.  Skin: Negative for rash.  Neurological: Negative for dizziness or headache.  No other specific complaints in a complete review of systems (except as listed in HPI above).   Objective  Vitals:   10/08/23 1015  BP: 138/82  Pulse: 86  Resp: 16  SpO2: 96%  Weight: (!) 373 lb 11.2 oz (169.5 kg)  Height: 5' 10 (1.778 m)    Body mass index is 53.62 kg/m.  Physical Exam  Constitutional: Patient appears well-developed and well-nourished. Obese  No distress.  HENT: Head: Normocephalic and atraumatic. Ears: B TMs ok, no erythema or effusion; Nose: Nose normal. Mouth/Throat: Oropharynx is clear and moist. No oropharyngeal exudate.  Eyes: Conjunctivae and EOM are normal. Pupils are equal, round, and reactive to light. No scleral icterus.  Neck: Normal range of motion. Neck supple. No JVD present. No thyromegaly present.  Cardiovascular: Normal rate, regular rhythm and normal heart sounds.  No murmur heard. No BLE edema. Pulmonary/Chest: Effort normal and breath sounds normal. No respiratory distress. Abdominal: Soft. Bowel sounds are normal, no distension. There is no tenderness. no masses Breast: no lumps or masses, no nipple discharge or rashes FEMALE GENITALIA:  Not done  RECTAL: not done  Musculoskeletal: Normal range of motion, no joint effusions. No gross deformities Neurological: he is alert and oriented to person, place, and time. No cranial nerve deficit. Coordination, balance, strength, speech and gait are normal.  Skin: Skin is warm and dry. No rash noted. No erythema.  Psychiatric: Patient has a  normal mood and affect. behavior is normal. Judgment and thought content normal.     Assessment & Plan  1. Encounter for screening mammogram for malignant neoplasm of breast (Primary)  - MM 3D SCREENING MAMMOGRAM BILATERAL BREAST; Future  2. Well adult exam  - Hepatitis B Surface AntiBODY  3. Need for pneumococcal 20-valent conjugate vaccination  - Pneumococcal conjugate vaccine 20-valent (Prevnar 20)  4. Controlled type 2 diabetes mellitus with microalbuminuria, without long-term current use of insulin  (HCC)  - empagliflozin  (JARDIANCE ) 25 MG TABS tablet; Take 1 tablet (25 mg total) by mouth daily.  Dispense: 90 tablet; Refill: 0    -USPSTF grade A and B recommendations reviewed with patient; age-appropriate recommendations, preventive care, screening tests, etc discussed and encouraged; healthy living encouraged; see AVS for patient education given to patient -Discussed importance of 150 minutes of physical activity weekly, eat two servings of fish weekly, eat one serving of tree nuts ( cashews, pistachios, pecans, almonds.SABRA) every other day, eat 6 servings of fruit/vegetables daily and drink plenty of water and avoid sweet beverages.   -Reviewed Health Maintenance: Yes.

## 2023-10-09 ENCOUNTER — Other Ambulatory Visit: Payer: Self-pay | Admitting: Family Medicine

## 2023-10-09 DIAGNOSIS — A6000 Herpesviral infection of urogenital system, unspecified: Secondary | ICD-10-CM

## 2023-10-09 LAB — CBC WITH DIFFERENTIAL/PLATELET
Absolute Lymphocytes: 3232 {cells}/uL (ref 850–3900)
Absolute Monocytes: 608 {cells}/uL (ref 200–950)
Basophils Absolute: 56 {cells}/uL (ref 0–200)
Basophils Relative: 0.7 %
Eosinophils Absolute: 112 {cells}/uL (ref 15–500)
Eosinophils Relative: 1.4 %
HCT: 34.2 % — ABNORMAL LOW (ref 35.0–45.0)
Hemoglobin: 10.6 g/dL — ABNORMAL LOW (ref 11.7–15.5)
MCH: 23.8 pg — ABNORMAL LOW (ref 27.0–33.0)
MCHC: 31 g/dL — ABNORMAL LOW (ref 32.0–36.0)
MCV: 76.9 fL — ABNORMAL LOW (ref 80.0–100.0)
MPV: 10.3 fL (ref 7.5–12.5)
Monocytes Relative: 7.6 %
Neutro Abs: 3992 {cells}/uL (ref 1500–7800)
Neutrophils Relative %: 49.9 %
Platelets: 325 Thousand/uL (ref 140–400)
RBC: 4.45 Million/uL (ref 3.80–5.10)
RDW: 15.3 % — ABNORMAL HIGH (ref 11.0–15.0)
Total Lymphocyte: 40.4 %
WBC: 8 Thousand/uL (ref 3.8–10.8)

## 2023-10-09 LAB — LIPID PANEL
Cholesterol: 145 mg/dL (ref <200)
HDL: 54 mg/dL (ref >=50)
LDL Cholesterol (Calc): 73 mg/dL
Non-HDL Cholesterol (Calc): 91 mg/dL (ref <130)
Total CHOL/HDL Ratio: 2.7 (calc) (ref <5.0)
Triglycerides: 92 mg/dL (ref <150)

## 2023-10-09 LAB — IRON,TIBC AND FERRITIN PANEL
%SAT: 12 % — ABNORMAL LOW (ref 16–45)
Ferritin: 17 ng/mL (ref 16–232)
Iron: 51 ug/dL (ref 40–190)
TIBC: 420 ug/dL (ref 250–450)

## 2023-10-09 LAB — COMPREHENSIVE METABOLIC PANEL WITH GFR
AG Ratio: 1.5 (calc) (ref 1.0–2.5)
ALT: 24 U/L (ref 6–29)
AST: 41 U/L — ABNORMAL HIGH (ref 10–35)
Albumin: 4.5 g/dL (ref 3.6–5.1)
Alkaline phosphatase (APISO): 53 U/L (ref 31–125)
BUN/Creatinine Ratio: 13 (calc) (ref 6–22)
BUN: 14 mg/dL (ref 7–25)
CO2: 28 mmol/L (ref 20–32)
Calcium: 9.2 mg/dL (ref 8.6–10.2)
Chloride: 102 mmol/L (ref 98–110)
Creat: 1.05 mg/dL — ABNORMAL HIGH (ref 0.50–0.99)
Globulin: 3 g/dL (ref 1.9–3.7)
Glucose, Bld: 87 mg/dL (ref 65–99)
Potassium: 3.9 mmol/L (ref 3.5–5.3)
Sodium: 140 mmol/L (ref 135–146)
Total Bilirubin: 0.3 mg/dL (ref 0.2–1.2)
Total Protein: 7.5 g/dL (ref 6.1–8.1)
eGFR: 67 mL/min/1.73m2 (ref >=60)

## 2023-10-09 LAB — MICROALBUMIN / CREATININE URINE RATIO
Creatinine, Urine: 113 mg/dL (ref 20–275)
Microalb Creat Ratio: 42 mg/g{creat} — ABNORMAL HIGH (ref <30)
Microalb, Ur: 4.8 mg/dL

## 2023-10-09 LAB — HEPATITIS B SURFACE ANTIBODY,QUALITATIVE: Hep B S Ab: NONREACTIVE

## 2023-10-09 LAB — VITAMIN D 25 HYDROXY (VIT D DEFICIENCY, FRACTURES): Vit D, 25-Hydroxy: 20 ng/mL — ABNORMAL LOW (ref 30–100)

## 2023-10-09 LAB — TSH: TSH: 5.63 m[IU]/L — ABNORMAL HIGH

## 2023-10-11 ENCOUNTER — Ambulatory Visit: Payer: Self-pay | Admitting: Family Medicine

## 2023-10-12 ENCOUNTER — Other Ambulatory Visit: Payer: Self-pay | Admitting: Family Medicine

## 2023-10-12 DIAGNOSIS — E890 Postprocedural hypothyroidism: Secondary | ICD-10-CM

## 2023-10-12 MED ORDER — LEVOTHYROXINE SODIUM 200 MCG PO TABS: 200.0000 ug | ORAL_TABLET | Freq: Every day | ORAL | 0 refills | Status: DC

## 2023-11-01 ENCOUNTER — Encounter: Payer: Self-pay | Admitting: Family Medicine

## 2023-11-01 ENCOUNTER — Ambulatory Visit: Admitting: Family Medicine

## 2023-11-01 VITALS — BP 134/76 | HR 93 | Resp 16 | Ht 70.0 in | Wt 370.4 lb

## 2023-11-01 DIAGNOSIS — K219 Gastro-esophageal reflux disease without esophagitis: Secondary | ICD-10-CM

## 2023-11-01 DIAGNOSIS — Z23 Encounter for immunization: Secondary | ICD-10-CM | POA: Diagnosis not present

## 2023-11-01 DIAGNOSIS — N181 Chronic kidney disease, stage 1: Secondary | ICD-10-CM

## 2023-11-01 DIAGNOSIS — E1129 Type 2 diabetes mellitus with other diabetic kidney complication: Secondary | ICD-10-CM

## 2023-11-01 DIAGNOSIS — I10 Essential (primary) hypertension: Secondary | ICD-10-CM

## 2023-11-01 DIAGNOSIS — R809 Proteinuria, unspecified: Secondary | ICD-10-CM

## 2023-11-01 DIAGNOSIS — E119 Type 2 diabetes mellitus without complications: Secondary | ICD-10-CM

## 2023-11-01 DIAGNOSIS — Z7984 Long term (current) use of oral hypoglycemic drugs: Secondary | ICD-10-CM

## 2023-11-01 DIAGNOSIS — E890 Postprocedural hypothyroidism: Secondary | ICD-10-CM

## 2023-11-01 DIAGNOSIS — J302 Other seasonal allergic rhinitis: Secondary | ICD-10-CM

## 2023-11-01 DIAGNOSIS — E282 Polycystic ovarian syndrome: Secondary | ICD-10-CM

## 2023-11-01 DIAGNOSIS — G4733 Obstructive sleep apnea (adult) (pediatric): Secondary | ICD-10-CM

## 2023-11-01 LAB — POCT GLYCOSYLATED HEMOGLOBIN (HGB A1C): Hemoglobin A1C: 6.5 % — AB (ref 4.0–5.6)

## 2023-11-01 MED ORDER — VALSARTAN 160 MG PO TABS: 160.0000 mg | ORAL_TABLET | Freq: Every day | ORAL | 0 refills | Status: DC

## 2023-11-01 MED ORDER — SEMAGLUTIDE (1 MG/DOSE) 4 MG/3ML ~~LOC~~ SOPN: 1.0000 mg | PEN_INJECTOR | SUBCUTANEOUS | 0 refills | Status: DC

## 2023-11-01 MED ORDER — TRIAMTERENE-HCTZ 37.5-25 MG PO CAPS: 1.0000 | ORAL_CAPSULE | Freq: Every day | ORAL | 0 refills | Status: DC

## 2023-11-01 MED ORDER — EMPAGLIFLOZIN 25 MG PO TABS: 25.0000 mg | ORAL_TABLET | Freq: Every day | ORAL | 0 refills | Status: DC

## 2023-11-01 MED ORDER — MONTELUKAST SODIUM 10 MG PO TABS: 10.0000 mg | ORAL_TABLET | Freq: Every day | ORAL | 0 refills | Status: DC

## 2023-11-01 NOTE — Progress Notes (Signed)
 Name: Michelle Ware   MRN: [id number]    DOB: 01/24/1980   Date:11/01/2023       Progress Note  Subjective  Chief Complaint  Chief Complaint  Patient presents with   Medical Management of Chronic Issues   Discussed the use of AI scribe software for clinical note transcription with the patient, who gave verbal consent to proceed.  History of Present Illness Michelle Ware is a 43 year old female with diabetes and hypertension who presents for a follow-up visit.  She was diagnosed with diabetes in February of the previous year. Her A1c was initially 7.2% and has since decreased to 6.5%. She is currently taking Jardiance  25 mg and Ozempic , which was recently increased to 1 mg. She experiences jitteriness when very hungry, which she attributes to not eating for a while.  She has a history of chronic kidney disease and has protein in her urine. Her kidney function tests, including EGFR, have been normal, and she has protein in her urine. She continues to take Jardiance .  She has hypertension and is currently taking triamterene  HCTZ 37.5/25 mg. She takes her medication daily but her blood pressure remains high. She has not taken ARB's in the past   She is obese, with a weight of 370 pounds, down from 373 pounds in July. She is on Ozempic  to aid with weight loss and diabetes management, she is only taking 0.5 mg dose at this time  She has obstructive sleep apnea and uses a CPAP machine nightly without issues. No problems with reflux and does not take any medications for it.  She has a history of anemia, with a current hemoglobin level of 10.6. Her iron storage has improved but remains low. She attributes her anemia to heavy menstrual cycles and has been consulting with her OB-GYN regarding fibroids, which may be contributing to her symptoms.  She has seasonal allergies but has not been taking Singulair  recently due to lack of symptoms. She is on thyroid  medication, taking 200 mcg daily, and her  levels have improved.  She has not received the hepatitis B vaccine and her vitamin D  levels were previously low, for which she is taking supplements. Her cholesterol levels have improved without medication.      Fibrosis 4 Score = 1.16 (Low risk)        Interpretation for patients with NAFLD          <1.30       -  F0-F1 (Low risk)          1.30-2.67 -  Indeterminate           >2.67      -  F3-F4 (High risk)     Validated for ages 20-65        Patient Active Problem List   Diagnosis Date Noted   Condyloma 09/21/2023   OSA on CPAP 07/28/2023   Iron deficiency anemia due to chronic blood loss 07/28/2023   Microalbuminuria 12/03/2022   Controlled type 2 diabetes mellitus with microalbuminuria, without long-term current use of insulin  (HCC) 10/07/2022   Stage 3a chronic kidney disease (HCC) 06/03/2022   Gastroesophageal reflux disease without esophagitis 07/03/2021   Anxiety 07/03/2021   GAD (generalized anxiety disorder) 07/03/2021   Dysthymia 07/03/2021   History of thyroidectomy 08/05/2017   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 of myomectomy and CHTN  09/28/2016   Tenosynovitis of wrist 05/21/2016   Morbid obesity (HCC) 03/31/2016   H/O myomectomy 03/11/2015  Essential hypertension 11/28/2014   Hypertrichosis 07/24/2014   Vitamin D  deficiency 07/24/2014   PCOS (polycystic ovarian syndrome) 07/24/2014   History of abnormal cervical Pap smear 11/24/2011   Genital herpes 11/29/2008    Past Surgical History:  Procedure Laterality Date   BIOPSY THYROID   07/2015   Dr. Damian   CESAREAN SECTION N/A 09/28/2016   Procedure: Primary CESAREAN SECTION;  Surgeon: Rutherford Gain, MD;  Location: [facility]ING SU[facility];  Service: Obstetrics;  Laterality: N/A;  EDD: 10/19/16 Allergy: Hydrocodone -Acetaminophen    LAPAROSCOPIC GELPORT ASSISTED MYOMECTOMY N/A 03/11/2015   Procedure: LAPAROSCOPIC  MYOMECTOMY--attempted;  Surgeon: Archie Savers, MD;   Location: [facility] ORS;  Service: Gynecology;  Laterality: N/A;   LAPAROTOMY N/A 03/11/2015   Procedure: LAPAROTOMY--MYOMECTOMY;  Surgeon: Archie Savers, MD;  Location: [facility] ORS;  Service: Gynecology;  Laterality: N/A;   MOUTH SURGERY  1996   TOTAL THYROIDECTOMY Bilateral 03/05/2017   Dr. Luke [facility]    Family History  Problem Relation Age of Onset   Thyroid  disease Mother    Hypertension Mother    Cancer Maternal Aunt    Cancer Maternal Grandfather    Prostate cancer Maternal Grandfather    Stroke Maternal Grandmother    Breast cancer Paternal Grandmother    Heart failure Paternal Grandfather    Liver disease Brother        Fatty Liver    Social History   Tobacco Use   Smoking status: Never   Smokeless tobacco: Never  Substance Use Topics   Alcohol use: Yes    Comment: occasionally, about once a month     Current Outpatient Medications:    Blood Glucose Monitoring Suppl DEVI, 1 each by Does not apply route in the morning, at noon, and at bedtime. May substitute to any manufacturer covered by patient's insurance., Disp: 1 each, Rfl: 0   Cholecalciferol (VITAMIN D3) 50 MCG (2000 UT) CAPS, Take 1 capsule by mouth daily at 12 noon., Disp: , Rfl:    desloratadine (CLARINEX) 5 MG tablet, Take 1 tablet (5 mg total) by mouth daily., Disp: 90 tablet, Rfl: 0   escitalopram  (LEXAPRO ) 5 MG tablet, TAKE 1 TABLET (5 MG TOTAL) BY MOUTH DAILY., Disp: 90 tablet, Rfl: 0   levothyroxine  (SYNTHROID ) 200 MCG tablet, Take 1 tablet (200 mcg total) by mouth daily before breakfast. Take an extra half pill once a week, Disp: 96 tablet, Rfl: 0   valACYclovir  (VALTREX ) 500 MG tablet, TAKE 1 TABLET BY MOUTH 2 TIMES DAILY FOR OUTBREAKS AND ONCE DAILY FOR PREVENTION AS DIRECTED, Disp: 180 tablet, Rfl: 1   valsartan (DIOVAN) 160 MG tablet, Take 1 tablet (160 mg total) by mouth daily., Disp: 90 tablet, Rfl: 0   azelastine  (ASTELIN ) 0.1 % nasal spray, Place 2 sprays into both nostrils 2 (two) times daily. Use in  each nostril as directed (Patient not taking: Reported on 11/01/2023), Disp: 30 mL, Rfl: 2   empagliflozin  (JARDIANCE ) 25 MG TABS tablet, Take 1 tablet (25 mg total) by mouth daily., Disp: 90 tablet, Rfl: 0   fluticasone  (FLONASE ) 50 MCG/ACT nasal spray, Place 2 sprays into both nostrils daily. (Patient not taking: Reported on 11/01/2023), Disp: 16 g, Rfl: 0   montelukast  (SINGULAIR ) 10 MG tablet, Take 1 tablet (10 mg total) by mouth at bedtime., Disp: 90 tablet, Rfl: 0   Semaglutide , 1 MG/DOSE, 4 MG/3ML SOPN, Inject 1 mg as directed once a week., Disp: 9 mL, Rfl: 0   triamterene -hydrochlorothiazide  (DYAZIDE) 37.5-25 MG capsule, Take 1 each (1 capsule  total) by mouth daily., Disp: 90 capsule, Rfl: 0  Allergies  Allergen Reactions   Hydrocodone  Itching    I personally reviewed active problem list, medication list, allergies, family history with the patient/caregiver today.   ROS  Ten systems reviewed and is negative except as mentioned in HPI    Objective Physical Exam MEASUREMENTS: Weight- 370. CONSTITUTIONAL: Patient appears well-developed and well-nourished.  No distress. HEENT: Head atraumatic, normocephalic, neck supple. CARDIOVASCULAR: Normal rate, regular rhythm and normal heart sounds.  No murmur heard. No BLE edema. PULMONARY: Effort normal and breath sounds normal. No respiratory distress. ABDOMINAL: There is no tenderness or distention. MUSCULOSKELETAL: Normal gait. Without gross motor or sensory deficit. PSYCHIATRIC: Patient has a normal mood and affect. behavior is normal. Judgment and thought content normal.  Vitals:   11/01/23 1455  BP: (!) 156/82  Pulse: 93  Resp: 16  SpO2: 98%  Weight: (!) 370 lb 6.4 oz (168 kg)  Height: 5' 10 (1.778 m)    Body mass index is 53.15 kg/m.  Recent Results (from the past 2160 hours)  CBC with Differential/Platelet     Status: Abnormal   Collection Time: 10/08/23 11:11 AM  Result Value Ref Range   WBC 8.0 3.8 - 10.8  Thousand/uL   RBC 4.45 3.80 - 5.10 Million/uL   Hemoglobin 10.6 (L) 11.7 - 15.5 g/dL   HCT 65.7 (L) 64.9 - 54.9 %   MCV 76.9 (L) 80.0 - 100.0 fL   MCH 23.8 (L) 27.0 - 33.0 pg   MCHC 31.0 (L) 32.0 - 36.0 g/dL    Comment: For adults, a slight decrease in the calculated MCHC value (in the range of 30 to 32 g/dL) is most likely not clinically significant; however, it should be interpreted with caution in correlation with other red cell parameters and the patient's clinical condition.    RDW 15.3 (H) 11.0 - 15.0 %   Platelets 325 140 - 400 Thousand/uL   MPV 10.3 7.5 - 12.5 fL   Neutro Abs 3,992 1,500 - 7,800 cells/uL   Absolute Lymphocytes 3,232 850 - 3,900 cells/uL   Absolute Monocytes 608 200 - 950 cells/uL   Eosinophils Absolute 112 15 - 500 cells/uL   Basophils Absolute 56 0 - 200 cells/uL   Neutrophils Relative % 49.9 %   Total Lymphocyte 40.4 %   Monocytes Relative 7.6 %   Eosinophils Relative 1.4 %   Basophils Relative 0.7 %  Comprehensive metabolic panel with GFR     Status: Abnormal   Collection Time: 10/08/23 11:11 AM  Result Value Ref Range   Glucose, Bld 87 65 - 99 mg/dL    Comment: .            Fasting reference interval .    BUN 14 7 - 25 mg/dL   Creat 8.94 (H) 9.49 - 0.99 mg/dL   eGFR 67 > OR = 60 fO/fpw/8.26f7   BUN/Creatinine Ratio 13 6 - 22 (calc)   Sodium 140 135 - 146 mmol/L   Potassium 3.9 3.5 - 5.3 mmol/L   Chloride 102 98 - 110 mmol/L   CO2 28 20 - 32 mmol/L   Calcium 9.2 8.6 - 10.2 mg/dL   Total Protein 7.5 6.1 - 8.1 g/dL   Albumin 4.5 3.6 - 5.1 g/dL   Globulin 3.0 1.9 - 3.7 g/dL (calc)   AG Ratio 1.5 1.0 - 2.5 (calc)   Total Bilirubin 0.3 0.2 - 1.2 mg/dL   Alkaline phosphatase (APISO) 53 31 - 125 U/L  AST 41 (H) 10 - 35 U/L   ALT 24 6 - 29 U/L  Iron, TIBC and Ferritin Panel     Status: Abnormal   Collection Time: 10/08/23 11:11 AM  Result Value Ref Range   Iron 51 40 - 190 mcg/dL   TIBC 579 749 - 549 mcg/dL (calc)   %SAT 12 (L) 16 - 45 %  (calc)   Ferritin 17 16 - 232 ng/mL  Lipid panel     Status: None   Collection Time: 10/08/23 11:11 AM  Result Value Ref Range   Cholesterol 145 <200 mg/dL   HDL 54 > OR = 50 mg/dL   Triglycerides 92 <849 mg/dL   LDL Cholesterol (Calc) 73 mg/dL (calc)    Comment: Reference range: <100 . Desirable range <100 mg/dL for primary prevention;   <70 mg/dL for patients with CHD or diabetic patients  with > or = 2 CHD risk factors. SABRA LDL-C is now calculated using the Martin-Hopkins  calculation, which is a validated novel method providing  better accuracy than the Friedewald equation in the  estimation of LDL-C.  Gladis APPLETHWAITE et al. SANDREA. 7986;689(80): 2061-2068  (http://education.[facility].com/faq/FAQ164)    Total CHOL/HDL Ratio 2.7 <5.0 (calc)   Non-HDL Cholesterol (Calc) 91 <869 mg/dL (calc)    Comment: For patients with diabetes plus 1 major ASCVD risk  factor, treating to a non-HDL-C goal of <100 mg/dL  (LDL-C of <29 mg/dL) is considered a therapeutic  option.   Microalbumin / creatinine urine ratio     Status: Abnormal   Collection Time: 10/08/23 11:11 AM  Result Value Ref Range   Creatinine, Urine 113 20 - 275 mg/dL   Microalb, Ur 4.8 mg/dL    Comment: Reference Range Not established    Microalb Creat Ratio 42 (H) <30 mg/g creat    Comment: . The ADA defines abnormalities in albumin excretion as follows: SABRA Albuminuria Category        Result (mg/g creatinine) . Normal to Mildly increased   <30 Moderately increased         30-299  Severely increased           > OR = 300 . The ADA recommends that at least two of three specimens collected within a 3-6 month period be abnormal before considering a patient to be within a diagnostic category.   TSH     Status: Abnormal   Collection Time: 10/08/23 11:11 AM  Result Value Ref Range   TSH 5.63 (H) mIU/L    Comment:           Reference Range .           > or = 20 Years  0.40-4.50 .                Pregnancy Ranges            First trimester    0.26-2.66           Second trimester   0.55-2.73           Third trimester    0.43-2.91   VITAMIN D  25 Hydroxy (Vit-D Deficiency, Fractures)     Status: Abnormal   Collection Time: 10/08/23 11:11 AM  Result Value Ref Range   Vit D, 25-Hydroxy 20 (L) 30 - 100 ng/mL    Comment: Vitamin D  Status         25-OH Vitamin D : . Deficiency:                    <  20 ng/mL Insufficiency:             20 - 29 ng/mL Optimal:                 > or = 30 ng/mL . For 25-OH Vitamin D  testing on patients on  D2-supplementation and patients for whom quantitation  of D2 and D3 fractions is required, the [facility]ure[facility](TM) 25-OH VIT D, (D2,D3), LC/MS/MS is recommended: order  code 07111 (patients >72yrs). . See Note 1 . Note 1 . For additional information, please refer to  http://education.[facility].com/faq/FAQ199  (This link is being provided for informational/ educational purposes only.)   Hepatitis B surface antibody,qualitative     Status: None   Collection Time: 10/08/23 11:11 AM  Result Value Ref Range   Hep B S Ab NON-REACTIVE NON-REACTIVE  POCT glycosylated hemoglobin (Hb A1C)     Status: Abnormal   Collection Time: 11/01/23  3:03 PM  Result Value Ref Range   Hemoglobin A1C 6.5 (A) 4.0 - 5.6 %   HbA1c POC (<> result, manual entry)     HbA1c, POC (prediabetic range)     HbA1c, POC (controlled diabetic range)       PHQ2/9:    11/01/2023    2:48 PM 10/08/2023   10:07 AM 07/28/2023    4:06 PM 04/08/2023   10:00 AM 01/06/2023    3:33 PM  Depression screen PHQ 2/9  Decreased Interest 0 0 0 0 0  Down, Depressed, Hopeless 0 0 0 0   PHQ - 2 Score 0 0 0 0 0  Altered sleeping 0  0 0 0  Tired, decreased energy 0  0 0 1  Change in appetite 0  0 0 2  Feeling bad or failure about yourself  0  0 0 0  Trouble concentrating 0  0 0 0  Moving slowly or fidgety/restless 0  0 0 0  Suicidal thoughts 0  0 0 0  PHQ-9 Score 0  0 0 3  Difficult doing work/chores Not  difficult at all  Not difficult at all Not difficult at all Not difficult at all    phq 9 is negative  Fall Risk:    11/01/2023    2:48 PM 10/08/2023   10:07 AM 07/28/2023    3:56 PM 01/06/2023    3:35 PM 12/03/2022    3:01 PM  Fall Risk   Falls in the past year? 0 0 0 1 1  Number falls in past yr: 0 0 0 1 1  Injury with Fall? 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 Jardiance  beneficial for diabetes and kidney function. Jitteriness likely due to hypoglycemia. - Continue Jardiance  25 mg daily. - Increase Ozempic  to 1 mg weekly. - Educate on balanced meals with more protein and less starch to prevent hypoglycemia. - Ensure correct prescription for Ozempic  at pharmacy.  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 Jardiance  reduces proteinuria. - Continue Jardiance  25 mg daily. - Monitor kidney function and proteinuria.  Hypertension Hypertension not well controlled. Discussed adding valsartan for better control and kidney protection. Explained potential  side effects of valsartan. - Add valsartan for blood pressure control and kidney protection. - Monitor blood pressure at home and follow up with CMA for blood pressure check in a couple of weeks.  Morbid Obesity Obesity contributes to diabetes, hypertension, and PCOS. Weight loss essential for health improvement. - Increase Ozempic  to 1 mg weekly to aid in weight loss. - Encourage dietary changes with more protein and less starch.  Polycystic ovary syndrome (PCOS) PCOS present. Weight loss expected to help manage  symptoms.  Iron deficiency anemia secondary to uterine fibroid Anemia due to heavy menstrual bleeding from fibroid. Hemoglobin improved but still low. Discussed hysterectomy as treatment option. - Continue ferrous sulfate  with Colace to prevent constipation. - Discuss hysterectomy with OB/GYN as a potential treatment option.  Uterine fibroid Fibroid causing heavy menstrual bleeding and contributing to anemia. Discussed hysterectomy if symptoms persist. - Discuss hysterectomy with OB/GYN.  Nonalcoholic fatty liver disease (NAFLD) NAFLD present with slightly elevated liver enzymes. Ozempic  beneficial for fatty liver disease. - Continue Ozempic  to manage NAFLD.  Hypothyroidism/post surgical  Hypothyroidism managed with levothyroxine . TSH improved but slightly above goal. - Continue current levothyroxine  regimen. - Recheck TSH in 6 weeks during nurse visit for blood pressure check.  Obstructive sleep apnea Obstructive sleep apnea managed with CPAP. - Continue using CPAP machine nightly.  Gastroesophageal reflux disease (GERD) GERD well controlled without medication.  Seasonal allergic rhinitis Seasonal allergies present. Montelukast  not taken regularly. - Prescribe montelukast  for seasonal allergies.  Vitamin D  deficiency Vitamin D  deficiency managed with supplementation. - Continue vitamin D  supplementation.  Dyslipidemia Discussed starting treatment due to high risk factors. Prefers to wait until next visit. - Consider starting cholesterol medication at next visit.

## 2023-11-06 ENCOUNTER — Other Ambulatory Visit: Payer: Self-pay | Admitting: Family Medicine

## 2023-11-06 DIAGNOSIS — F341 Dysthymic disorder: Secondary | ICD-10-CM

## 2023-11-06 DIAGNOSIS — I10 Essential (primary) hypertension: Secondary | ICD-10-CM

## 2023-11-06 DIAGNOSIS — E282 Polycystic ovarian syndrome: Secondary | ICD-10-CM

## 2023-11-15 ENCOUNTER — Ambulatory Visit

## 2023-11-15 VITALS — BP 132/70

## 2023-11-15 DIAGNOSIS — Z0130 Encounter for examination of blood pressure without abnormal findings: Secondary | ICD-10-CM

## 2023-11-15 DIAGNOSIS — I10 Essential (primary) hypertension: Secondary | ICD-10-CM

## 2023-11-15 NOTE — Progress Notes (Signed)
 Patient is in office today for a nurse visit for Blood Pressure Check. Patient blood pressure was 132/70, Patient No chest pain, No shortness of breath, No dyspnea on exertion

## 2023-11-18 ENCOUNTER — Other Ambulatory Visit: Payer: Self-pay | Admitting: Family Medicine

## 2023-11-18 DIAGNOSIS — J302 Other seasonal allergic rhinitis: Secondary | ICD-10-CM

## 2023-11-25 ENCOUNTER — Ambulatory Visit: Payer: Self-pay

## 2023-11-25 ENCOUNTER — Ambulatory Visit: Admitting: Internal Medicine

## 2023-11-25 NOTE — Telephone Encounter (Signed)
 FYI Only or Action Required?: FYI only for provider: appointment scheduled on 11/25/2023.  Patient was last seen in primary care on 11/01/2023 by Glenard Mire, MD.  Called Nurse Triage reporting Rectal Pain - had minor sx on Friday to remove skin.  Symptoms began several days ago.  Interventions attempted: Other: Sitz baths, IBU.  Symptoms are: gradually worsening.  Triage Disposition: See Physician Within 24 Hours  Patient/caregiver understands and will follow disposition?: Yes                      Copied from CRM [id number]. Topic: Clinical - Red Word Triage >> Nov 25, 2023  8:21 AM Myrick T wrote: Red Word that prompted transfer to Nurse Triage: patient said she has burning at her anal from the colonoscopy she had on Friday. Reason for Disposition  MODERATE-SEVERE rectal pain (i.e., interferes with school, work, or sleep)  Answer Assessment - Initial Assessment Questions 1. SYMPTOM:  What's the main symptom you're concerned about? (e.g., pain, itching, swelling, rash)     pain 2. ONSET: When did the pain  start?     Monday 3. RECTAL PAIN: Do you have any pain around your rectum? How bad is the pain?  (Scale 0-10; or none, mild, moderate, severe)     3-4/10 - can go to 8/10 4. RECTAL ITCHING: Do you have any itching in this area? How bad is the itching?  (Scale 0-10; or none, mild, moderate, severe)     no 6. CAUSE: What do you think is causing the anus symptoms?     Removed skin at anus 7. OTHER SYMPTOMS: Do you have any other symptoms?  (e.g., abdomen pain, fever, rectal bleeding, vomiting)     Blood when wiping  Protocols used: Rectal Symptoms-A-AH

## 2023-11-29 ENCOUNTER — Ambulatory Visit
Admission: RE | Admit: 2023-11-29 | Discharge: 2023-11-29 | Disposition: A | Discharge status: Home / Self Care | Source: Ambulatory Visit | Attending: Family Medicine | Admitting: Family Medicine

## 2023-11-29 DIAGNOSIS — Z1231 Encounter for screening mammogram for malignant neoplasm of breast: Secondary | ICD-10-CM | POA: Diagnosis present

## 2024-01-12 ENCOUNTER — Other Ambulatory Visit: Payer: Self-pay | Admitting: Family Medicine

## 2024-01-14 NOTE — Telephone Encounter (Signed)
 Requested Prescriptions  Pending Prescriptions Disp Refills   levothyroxine  (SYNTHROID ) 200 MCG tablet [Pharmacy Med Name: LEVOTHYROXINE  200 MCG TABLET] 96 tablet 0    Sig: TAKE 1 TABLET BY MOUTH DAILY BEFORE BREAKFAST. TAKE AN EXTRA 1/2 TABLET ONCE A WEEK     Endocrinology:  Hypothyroid Agents Failed - 01/14/2024 12:57 PM      Failed - TSH in normal range and within 360 days    TSH  Date Value Ref Range Status  10/08/2023 5.63 (H) mIU/L Final    Comment:              Reference Range .           > or = 20 Years  0.40-4.50 .                Pregnancy Ranges           First trimester    0.26-2.66           Second trimester   0.55-2.73           Third trimester    0.43-2.91          Passed - Valid encounter within last 12 months    Recent Outpatient Visits           2 months ago Controlled type 2 diabetes mellitus with microalbuminuria [facility])   [place] [facility] Glenard Mire, MD   3 months ago Encounter for screening mammogram for malignant neoplasm of breast   [facility] Glenard Mire, MD   5 months ago Controlled type 2 diabetes mellitus with microalbuminuria, without long-term current use of insulin  [facility])   [place] [facility] Glenard Mire, MD   9 months ago Controlled type 2 diabetes mellitus with microalbuminuria, without long-term current use of insulin  [facility])   [facility] Glenard Mire, MD   9 months ago URI, acute   [facility] Sowles, Krichna, MD

## 2024-01-18 ENCOUNTER — Other Ambulatory Visit (HOSPITAL_COMMUNITY)
Admission: RE | Admit: 2024-01-18 | Discharge: 2024-01-18 | Disposition: A | Discharge status: Home / Self Care | Source: Ambulatory Visit | Attending: Obstetrics & Gynecology | Admitting: Obstetrics & Gynecology

## 2024-01-18 ENCOUNTER — Ambulatory Visit: Admitting: Obstetrics & Gynecology

## 2024-01-18 VITALS — BP 100/71 | HR 116 | Ht 70.0 in | Wt 361.7 lb

## 2024-01-18 DIAGNOSIS — Z124 Encounter for screening for malignant neoplasm of cervix: Secondary | ICD-10-CM

## 2024-01-18 DIAGNOSIS — Z01419 Encounter for gynecological examination (general) (routine) without abnormal findings: Secondary | ICD-10-CM | POA: Diagnosis not present

## 2024-01-18 MED ORDER — MISOPROSTOL 200 MCG PO TABS: ORAL_TABLET | ORAL | 0 refills | Status: AC

## 2024-01-18 NOTE — Progress Notes (Signed)
 "   GYNECOLOGY ANNUAL PHYSICAL EXAM PROGRESS NOTE  Subjective:    Michelle Ware is a 43 y.o.single G2P1011 (13 yo son) who presents for an annual exam.  The patient is not currently sexually active, for about 6 years.  The patient participates in regular exercise: no. Has the patient ever been transfused or tattooed?: yes. The patient reports that there is not domestic violence in her life.   The patient has the following complaints today: She still has heavy periods, HBG still 10.6. Her EMBX this year was benign. She is considering Mirena but her cousin has a bad reaction.  Menstrual History: Menarche age: 29 Patient's last menstrual period was 01/10/2024 (exact date).     Gynecologic History:  Contraception: abstinence History of STI's:  Last Pap: 2022. Results were: normal.  Notes h/o abnormal pap smear in 2003. Last mammogram: 11/2023. Results were: normal Colonoscopy 09/2023 (good for 10 years) in [facility] but was found to have perianal warts that were biopsied. They will follow up annually.  FH- + breast cancer in paternal GM, + colon cancer in maternal aunt, + prostate cancer in maternal GF.   OB History  Gravida Para Term Preterm AB Living  2 1 1  0 1 1  SAB IAB Ectopic Multiple Live Births  1 0 0 0 1    # Outcome Date GA Lbr Len/2nd Weight Sex Type Anes PTL Lv  2 Term 09/28/16 [redacted]w[redacted]d  6 lb 11.9 oz (3.06 kg) M CS-LTranv Spinal  LIV     Birth Comments: Scheduled C-Section with maternal history of myomectomy. Had poor saturations and increased WOB in the OR and was placed on oxyhood. (see NICU note) Was observed in the nursery under oxyhood     Name: Bickham,BOY Shanty     Apgar1: 8  Apgar5: 8  1 SAB 12/03/14 [redacted]w[redacted]d       FD    Obstetric Comments  1st Menstrual Cycle:  11    Past Medical History:  Diagnosis Date   Anemia    Dermatophytosis of foot    Fibroid    Goiter    Gross hematuria    Herpes, genital    Hypertension    Low grade squamous intraepithelial  lesion (LGSIL)    Morbid obesity with BMI of 40.0-44.9, adult (HCC)    Polycystic ovarian syndrome    Vaginal Pap smear, abnormal    Vitamin D  deficiency     Past Surgical History:  Procedure Laterality Date   BIOPSY THYROID   07/2015   Dr. Damian   CESAREAN SECTION N/A 09/28/2016   Procedure: Primary CESAREAN SECTION;  Surgeon: Rutherford Gain, MD;  Location: [facility];  Service: Obstetrics;  Laterality: N/A;  EDD: 10/19/16 Allergy: Hydrocodone -Acetaminophen    LAPAROSCOPIC GELPORT ASSISTED MYOMECTOMY N/A 03/11/2015   Procedure: LAPAROSCOPIC  MYOMECTOMY--attempted;  Surgeon: Archie Savers, MD;  Location: [facility] ORS;  Service: Gynecology;  Laterality: N/A;   LAPAROTOMY N/A 03/11/2015   Procedure: LAPAROTOMY--MYOMECTOMY;  Surgeon: Archie Savers, MD;  Location: [facility] ORS;  Service: Gynecology;  Laterality: N/A;   MOUTH SURGERY  1996   TOTAL THYROIDECTOMY Bilateral 03/05/2017   Dr. [facility]    Family History  Problem Relation Age of Onset   Thyroid  disease Mother    Hypertension Mother    Cancer Maternal Aunt    Cancer Maternal Grandfather    Prostate cancer Maternal Grandfather    Stroke Maternal Grandmother    Breast cancer Paternal Grandmother    Heart failure Paternal  Grandfather    Liver disease Brother        Fatty Liver    Social History   Socioeconomic History   Marital status: Single    Spouse name: Not on file   Number of children: 1   Years of education: Not on file   Highest education level: Bachelor's degree (e.g., BA, AB, BS)  Occupational History   Occupation: location manager    Comment: works for [employer] in [employer]  Tobacco Use   Smoking status: Never   Smokeless tobacco: Never  Vaping Use   Vaping status: Never Used  Substance and Sexual Activity   Alcohol use: Yes    Comment: occasionally, about once a month   Drug use: No   Sexual activity: Not Currently    Partners: Male    Birth control/protection: Condom    Comment: last active  2019  Other Topics Concern   Not on file  Social History Narrative   Lives by herself, broke up with Gaither, but they had a son 09/28/2016 and he is involved in their son's life   Working full time at [employer] ( cigarette [employer]), works at [employer] side.   Social Drivers of Health   Tobacco Use: Low Risk (12/21/2023)   Received from [facility]   Patient History    Smoking Tobacco Use: Never    Smokeless Tobacco Use: Never    Passive Exposure: Not on file  Financial Resource Strain: Low Risk (10/08/2023)   Overall Financial Resource Strain (CARDIA)    Difficulty of Paying Living Expenses: Not hard at all  Food Insecurity: No Food Insecurity (10/08/2023)   Epic    Worried About Programme Researcher, Broadcasting/film/video in the Last Year: Never true    Ran Out of Food in the Last Year: Never true  Transportation Needs: No Transportation Needs (10/08/2023)   Epic    Lack of Transportation (Medical): No    Lack of Transportation (Non-Medical): No  Physical Activity: Inactive (10/08/2023)   Exercise Vital Sign    Days of Exercise per Week: 0 days    Minutes of Exercise per Session: 0 min  Stress: No Stress Concern Present (10/08/2023)   [employer] of Occupational Health - Occupational Stress Questionnaire    Feeling of Stress: Not at all  Social Connections: Moderately Isolated (10/08/2023)   Social Connection and Isolation Panel    Frequency of Communication with Friends and Family: More than three times a week    Frequency of Social Gatherings with Friends and Family: More than three times a week    Attends Religious Services: More than 4 times per year    Active Member of [employer] or Organizations: No    Attends Banker Meetings: Never    Marital Status: Never married  Intimate Partner Violence: Not At Risk (10/08/2023)   Epic    Fear of Current or Ex-Partner: No    Emotionally Abused: No    Physically Abused: No    Sexually Abused: No  Depression (PHQ2-9): Low Risk (11/01/2023)    Depression (PHQ2-9)    PHQ-2 Score: 0  Alcohol Screen: Low Risk (10/08/2023)   Alcohol Screen    Last Alcohol Screening Score (AUDIT): 0  Housing: Unknown (10/08/2023)   Epic    Unable to Pay for Housing in the Last Year: No    Number of Times Moved in the Last Year: Not on file    Homeless in the Last Year: No  Utilities: Not  At Risk (10/08/2023)   Epic    Threatened with loss of utilities: No  Health Literacy: Adequate Health Literacy (10/08/2023)   B1300 Health Literacy    Frequency of need for help with medical instructions: Never    Medications Ordered Prior to Encounter[1]  Allergies[2]   Review of Systems Constitutional: negative for chills, fatigue, fevers and sweats Eyes: negative for irritation, redness and visual disturbance Ears, nose, mouth, throat, and face: negative for hearing loss, nasal congestion, snoring and tinnitus Respiratory: negative for asthma, cough, sputum Cardiovascular: negative for chest pain, dyspnea, exertional chest pressure/discomfort, irregular heart beat, palpitations and syncope Gastrointestinal: negative for abdominal pain, change in bowel habits, nausea and vomiting Genitourinary: negative for abnormal menstrual periods, genital lesions, sexual problems and vaginal discharge, dysuria and urinary incontinence Integument/breast: negative for breast lump, breast tenderness and nipple discharge Hematologic/lymphatic: negative for bleeding and easy bruising Musculoskeletal:negative for back pain and muscle weakness Neurological: negative for dizziness, headaches, vertigo and weakness Endocrine: negative for diabetic symptoms including polydipsia, polyuria and skin dryness Allergic/Immunologic: negative for hay fever and urticaria      Objective:  Blood pressure 100/71, pulse (!) 116, height 5' 10 (1.778 m), weight (!) 361 lb 11.2 oz (164.1 kg), last menstrual period 01/10/2024. Body mass index is 51.9 kg/m.    General Appearance:    Alert,  cooperative, no distress, appears stated age  Head:    Normocephalic, without obvious abnormality, atraumatic  Eyes:    PERRL, conjunctiva/corneas clear, EOM's intact, both eyes  Ears:    Normal external ear canals, both ears  Nose:   Nares normal, septum midline, mucosa normal, no drainage or sinus tenderness  Throat:   Lips, mucosa, and tongue normal; teeth and gums normal  Neck:   Supple, symmetrical, trachea midline, no adenopathy; thyroid : no enlargement/tenderness/nodules; no carotid bruit or JVD  Back:     Symmetric, no curvature, ROM normal, no CVA tenderness  Lungs:     Clear to auscultation bilaterally, respirations unlabored  Chest Wall:    No tenderness or deformity   Heart:    Regular rate and rhythm, S1 and S2 normal, no murmur, rub or gallop  Breast Exam:    No tenderness, masses, or nipple abnormality  Abdomen:     Soft, non-tender, bowel sounds active all four quadrants, no masses, no organomegaly.    Genitalia:    Pelvic:external genitalia normal, vagina without lesions, discharge, or tenderness,  Cervix normal in appearance, nulliparou     Extremities:   Extremities normal, atraumatic, no cyanosis or edema  Pulses:   2+ and symmetric all extremities  Skin:   Skin color, texture, turgor normal, no rashes or lesions  Lymph nodes:   Cervical, supraclavicular, and axillary nodes normal  Neurologic:   CNII-XII intact, normal strength, sensation and reflexes throughout   .  Labs:  Lab Results  Component Value Date   WBC 8.0 10/08/2023   HGB 10.6 (L) 10/08/2023   HCT 34.2 (L) 10/08/2023   MCV 76.9 (L) 10/08/2023   PLT 325 10/08/2023    Lab Results  Component Value Date   CREATININE 1.05 (H) 10/08/2023   BUN 14 10/08/2023   NA 140 10/08/2023   K 3.9 10/08/2023   CL 102 10/08/2023   CO2 28 10/08/2023    Lab Results  Component Value Date   ALT 24 10/08/2023   AST 41 (H) 10/08/2023   ALKPHOS 30 (L) 04/07/2016   BILITOT 0.3 10/08/2023    Lab Results  Component Value Date   TSH 5.63 (H) 10/08/2023     Assessment:   1. Well woman exam with routine gynecological exam   2. Cervical cancer screening      Plan:   If she wants a Mirena IUD to help manage her menorrhagia, I would recommend pre treatment with cytotec . Breast self exam technique reviewed and patient encouraged to perform self-exam monthly.  Discussed healthy lifestyle modifications.  Pap smear ordered.  Follow up in 1 year for annual exam   Starla Harland BROCKS, MD [facility]    [1]  Current Outpatient Medications on File Prior to Visit  Medication Sig Dispense Refill   azelastine  (ASTELIN ) 0.1 % nasal spray Place 2 sprays into both nostrils 2 (two) times daily. Use in each nostril as directed 30 mL 2   Blood Glucose Monitoring Suppl DEVI 1 each by Does not apply route in the morning, at noon, and at bedtime. May substitute to any manufacturer covered by patient's insurance. 1 each 0   Cholecalciferol (VITAMIN D3) 50 MCG (2000 UT) CAPS Take 1 capsule by mouth daily at 12 noon.     empagliflozin  (JARDIANCE ) 25 MG TABS tablet Take 1 tablet (25 mg total) by mouth daily. 90 tablet 0   escitalopram  (LEXAPRO ) 5 MG tablet TAKE 1 TABLET (5 MG TOTAL) BY MOUTH DAILY. 90 tablet 0   fluticasone  (FLONASE ) 50 MCG/ACT nasal spray Place 2 sprays into both nostrils daily. 16 g 0   levocetirizine (XYZAL ) 5 MG tablet Take 1 tablet (5 mg total) by mouth every evening. 90 tablet 0   levothyroxine  (SYNTHROID ) 200 MCG tablet TAKE 1 TABLET BY MOUTH DAILY BEFORE BREAKFAST. TAKE AN EXTRA 1/2 TABLET ONCE A WEEK 96 tablet 0   montelukast  (SINGULAIR ) 10 MG tablet Take 1 tablet (10 mg total) by mouth at bedtime. 90 tablet 0   Semaglutide , 1 MG/DOSE, 4 MG/3ML SOPN Inject 1 mg as directed once a week. 9 mL 0   triamterene -hydrochlorothiazide  (DYAZIDE) 37.5-25 MG capsule Take 1 each (1 capsule total) by mouth daily. 90 capsule 0   valACYclovir  (VALTREX ) 500 MG tablet TAKE 1 TABLET BY MOUTH 2 TIMES  DAILY FOR OUTBREAKS AND ONCE DAILY FOR PREVENTION AS DIRECTED 180 tablet 1   valsartan  (DIOVAN ) 160 MG tablet Take 1 tablet (160 mg total) by mouth daily. 90 tablet 0   No current facility-administered medications on file prior to visit.  [2]  Allergies Allergen Reactions   Hydrocodone  Itching   "

## 2024-01-21 LAB — CYTOLOGY - PAP
Comment: NEGATIVE
Diagnosis: NEGATIVE
High risk HPV: NEGATIVE

## 2024-01-30 ENCOUNTER — Other Ambulatory Visit: Payer: Self-pay | Admitting: Family Medicine

## 2024-01-30 DIAGNOSIS — E119 Type 2 diabetes mellitus without complications: Secondary | ICD-10-CM

## 2024-01-31 NOTE — Telephone Encounter (Signed)
 Requested Prescriptions  Pending Prescriptions Disp Refills   triamterene -hydrochlorothiazide  (DYAZIDE) 37.5-25 MG capsule [Pharmacy Med Name: TRIAMTERENE -HCTZ 37.5-25 MG CP] 90 capsule 0    Sig: TAKE 1 EACH (1 CAPSULE TOTAL) BY MOUTH DAILY.     Cardiovascular: Diuretic Combos Failed - 01/31/2024  5:33 PM      Failed - Cr in normal range and within 180 days    Creat  Date Value Ref Range Status  10/08/2023 1.05 (H) 0.50 - 0.99 mg/dL Final   Creatinine, Urine  Date Value Ref Range Status  10/08/2023 113 20 - 275 mg/dL Final         Passed - K in normal range and within 180 days    Potassium  Date Value Ref Range Status  10/08/2023 3.9 3.5 - 5.3 mmol/L Final         Passed - Na in normal range and within 180 days    Sodium  Date Value Ref Range Status  10/08/2023 140 135 - 146 mmol/L Final  04/07/2016 136 134 - 144 mmol/L Final         Passed - Last BP in normal range    BP Readings from Last 1 Encounters:  01/18/24 100/71         Passed - Valid encounter within last 6 months    Recent Outpatient Visits           3 months ago Controlled type 2 diabetes mellitus with microalbuminuria [facility])   [place] Glenard Mire, MD   3 months ago Encounter for screening mammogram for malignant neoplasm of breast   [facility] Glenard Mire, MD   6 months ago Controlled type 2 diabetes mellitus with microalbuminuria, without long-term current use of insulin  [facility])   [place] Glenard Mire, MD   9 months ago Controlled type 2 diabetes mellitus with microalbuminuria, without long-term current use of insulin  [facility])   [facility] Glenard Mire, MD   9 months ago URI, acute   [facility] Sowles, Krichna, MD

## 2024-02-01 ENCOUNTER — Encounter: Payer: Self-pay | Admitting: Family Medicine

## 2024-02-01 ENCOUNTER — Ambulatory Visit: Admitting: Family Medicine

## 2024-02-01 VITALS — BP 132/74 | HR 89 | Resp 16 | Ht 70.0 in | Wt 359.8 lb

## 2024-02-01 DIAGNOSIS — N920 Excessive and frequent menstruation with regular cycle: Secondary | ICD-10-CM

## 2024-02-01 DIAGNOSIS — D500 Iron deficiency anemia secondary to blood loss (chronic): Secondary | ICD-10-CM

## 2024-02-01 DIAGNOSIS — E890 Postprocedural hypothyroidism: Secondary | ICD-10-CM

## 2024-02-01 DIAGNOSIS — E1129 Type 2 diabetes mellitus with other diabetic kidney complication: Secondary | ICD-10-CM

## 2024-02-01 DIAGNOSIS — R809 Proteinuria, unspecified: Secondary | ICD-10-CM

## 2024-02-01 DIAGNOSIS — E66813 Obesity, class 3: Secondary | ICD-10-CM

## 2024-02-01 DIAGNOSIS — E559 Vitamin D deficiency, unspecified: Secondary | ICD-10-CM

## 2024-02-01 DIAGNOSIS — Z23 Encounter for immunization: Secondary | ICD-10-CM

## 2024-02-01 DIAGNOSIS — I10 Essential (primary) hypertension: Secondary | ICD-10-CM

## 2024-02-01 DIAGNOSIS — E119 Type 2 diabetes mellitus without complications: Secondary | ICD-10-CM | POA: Insufficient documentation

## 2024-02-01 DIAGNOSIS — Z7985 Long-term (current) use of injectable non-insulin antidiabetic drugs: Secondary | ICD-10-CM

## 2024-02-01 DIAGNOSIS — J302 Other seasonal allergic rhinitis: Secondary | ICD-10-CM

## 2024-02-01 DIAGNOSIS — F341 Dysthymic disorder: Secondary | ICD-10-CM

## 2024-02-01 DIAGNOSIS — G4733 Obstructive sleep apnea (adult) (pediatric): Secondary | ICD-10-CM

## 2024-02-01 LAB — POCT GLYCOSYLATED HEMOGLOBIN (HGB A1C): Hemoglobin A1C: 6.5 % — AB (ref 4.0–5.6)

## 2024-02-01 MED ORDER — EMPAGLIFLOZIN 25 MG PO TABS: 25.0000 mg | ORAL_TABLET | Freq: Every day | ORAL | 1 refills | Status: AC

## 2024-02-01 MED ORDER — SEMAGLUTIDE (1 MG/DOSE) 4 MG/3ML ~~LOC~~ SOPN: 1.0000 mg | PEN_INJECTOR | SUBCUTANEOUS | 0 refills | Status: AC

## 2024-02-01 MED ORDER — MONTELUKAST SODIUM 10 MG PO TABS: 10.0000 mg | ORAL_TABLET | Freq: Every day | ORAL | 1 refills | Status: AC

## 2024-02-01 MED ORDER — VALSARTAN 80 MG PO TABS: 80.0000 mg | ORAL_TABLET | Freq: Every day | ORAL | 0 refills | Status: AC

## 2024-02-01 NOTE — Progress Notes (Signed)
 Name: Michelle Ware   MRN: [id number]    DOB: [birth year]   Date:02/01/2024       Progress Note  Subjective  Chief Complaint  Chief Complaint  Patient presents with   Medical Management of Chronic Issues   Discussed the use of AI scribe software for clinical note transcription with the patient, who gave verbal consent to proceed.  History of Present Illness Seraya Jobst is a 44 year old female with type 2 diabetes mellitus, hypertension, dyslipidemia, anemia, post-surgical hypothyroidism, sleep apnea, and seasonal allergies who presents for a routine follow-up.  Her most recent A1c for type 2 diabetes mellitus was 6.5%. She has been more mindful about her diet, cooking more at home, which has contributed to weight loss. She is currently on Jardiance  25 mg daily and Ozempic  1 mg weekly. She experienced gastrointestinal upset with an increased dose of Ozempic  but is managing well on the current dose.  She has hypertension and is currently taking triamterene /HCTZ. She was prescribed valsartan  160 mg but has not been taking it. Denies chest pain or palpitation   She has dyslipidemia with low HDL cholesterol levels. She is not currently on any medication for cholesterol.  She has a history of anemia with a hemoglobin level of 10, which has been persistent. She experiences fatigue. She has heavy menstrual bleeding and a history of anemia.  She has post-surgical hypothyroidism and is taking levothyroxine  200 mcg daily with an extra half pill once a week. She experienced a lapse in medication due to prescription issues.  She has seasonal allergies and takes montelukast  as needed, typically starting in March. She also uses oral allergy medications and is considering trying a nasal spray with her new insurance coverage.  She received her first hepatitis B vaccination in October.    Patient Active Problem List   Diagnosis Date Noted   Obesity, diabetes, and hypertension syndrome (HCC) 02/01/2024    Seasonal allergies 02/01/2024   Condyloma 09/21/2023   OSA on CPAP 07/28/2023   Iron deficiency anemia due to chronic blood loss 07/28/2023   Microalbuminuria 12/03/2022   Controlled type 2 diabetes mellitus with microalbuminuria (HCC) 10/07/2022   Stage 3a chronic kidney disease (HCC) 06/03/2022   Gastroesophageal reflux disease without esophagitis 07/03/2021   Anxiety 07/03/2021   GAD (generalized anxiety disorder) 07/03/2021   Dysthymia 07/03/2021   History of thyroidectomy 08/05/2017   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 of myomectomy and CHTN  09/28/2016   Tenosynovitis of wrist 05/21/2016   Morbid obesity (HCC) 03/31/2016   H/O myomectomy 03/11/2015   Essential hypertension 11/28/2014   Hypertrichosis 07/24/2014   Vitamin D  deficiency 07/24/2014   PCOS (polycystic ovarian syndrome) 07/24/2014   History of abnormal cervical Pap smear 11/24/2011   Genital herpes 11/29/2008    Past Surgical History:  Procedure Laterality Date   BIOPSY THYROID   07/2015   Dr. Damian   CESAREAN SECTION N/A 09/28/2016   Procedure: Primary CESAREAN SECTION;  Surgeon: Rutherford Gain, MD;  Location: [facility];  Service: Obstetrics;  Laterality: N/A;  EDD: 10/19/16 Allergy: Hydrocodone -Acetaminophen    LAPAROSCOPIC GELPORT ASSISTED MYOMECTOMY N/A 03/11/2015   Procedure: LAPAROSCOPIC  MYOMECTOMY--attempted;  Surgeon: Archie Savers, MD;  Location: [facility] ORS;  Service: Gynecology;  Laterality: N/A;   LAPAROTOMY N/A 03/11/2015   Procedure: LAPAROTOMY--MYOMECTOMY;  Surgeon: Archie Savers, MD;  Location: [facility] OR[facility];  Service: Gynecology;  Laterality: N/A;   MOUTH SURGERY  1996   TOTAL THYROIDECTOMY Bilateral 03/05/2017   Dr. Luke -  [facility]    Family History  Problem Relation Age of Onset   Thyroid  disease Mother    Hypertension Mother    Cancer Maternal Aunt    Cancer Maternal Grandfather    Prostate cancer Maternal Grandfather    Stroke Maternal  Grandmother    Breast cancer Paternal Grandmother    Heart failure Paternal Grandfather    Liver disease Brother        Fatty Liver    Social History   Tobacco Use   Smoking status: Never   Smokeless tobacco: Never  Substance Use Topics   Alcohol use: Yes    Comment: occasionally, about once a month    Current Medications[1]  Allergies[2]  I personally reviewed active problem list, medication list, allergies, family history with the patient/caregiver today.   ROS  Ten systems reviewed and is negative except as mentioned in HPI    Objective Physical Exam  CONSTITUTIONAL: Patient appears well-developed and well-nourished. No distress. HEENT: Head atraumatic, normocephalic, neck supple. CARDIOVASCULAR: Normal rate, regular rhythm and normal heart sounds. No murmur heard. Trace swelling in extremities, likely work-related. PULMONARY: Effort normal and breath sounds normal. Lungs clear to auscultation. No respiratory distress. ABDOMINAL: There is no tenderness or distention. MUSCULOSKELETAL: Normal gait. Without gross motor or sensory deficit. PSYCHIATRIC: Patient has a normal mood and affect. Behavior is normal. Judgment and thought content normal.  Vitals:   02/01/24 1449 02/01/24 1516  BP: 134/74 132/74  Pulse: 89   Resp: 16   SpO2: 93%   Weight: (!) 359 lb 12.8 oz (163.2 kg)   Height: 5' 10 (1.778 m)     Body mass index is 51.63 kg/m.  Recent Results (from the past 2160 hours)  Cytology - PAP     Status: None   Collection Time: 01/18/24  4:02 PM  Result Value Ref Range   High risk HPV Negative    Adequacy      Satisfactory for evaluation; transformation zone component PRESENT.   Diagnosis      - Negative for intraepithelial lesion or malignancy (NILM)   Comment Normal Reference Range HPV - Negative   POCT glycosylated hemoglobin (Hb A1C)     Status: Abnormal   Collection Time: 02/01/24  2:58 PM  Result Value Ref Range   Hemoglobin A1C 6.5 (A) 4.0 - 5.6  %   HbA1c POC (<> result, manual entry)     HbA1c, POC (prediabetic range)     HbA1c, POC (controlled diabetic range)      Diabetic Foot Exam:     PHQ2/9:    02/01/2024    2:43 PM 11/01/2023    2:48 PM 10/08/2023   10:07 AM 07/28/2023    4:06 PM 04/08/2023   10:00 AM  Depression screen PHQ 2/9  Decreased Interest 0 0 0 0 0  Down, Depressed, Hopeless 0 0 0 0 0  PHQ - 2 Score 0 0 0 0 0  Altered sleeping 0 0  0 0  Tired, decreased energy 0 0  0 0  Change in appetite 0 0  0 0  Feeling bad or failure about yourself  0 0  0 0  Trouble concentrating 0 0  0 0  Moving slowly or fidgety/restless 0 0  0 0  Suicidal thoughts 0 0  0 0  PHQ-9 Score 0 0   0  0   Difficult doing work/chores Not difficult at all Not difficult at all  Not difficult at all Not difficult at all  Data saved with a previous flowsheet row definition    phq 9 is negative  Fall Risk:    02/01/2024    2:42 PM 11/01/2023    2:48 PM 10/08/2023   10:07 AM 07/28/2023    3:56 PM 01/06/2023    3:35 PM  Fall Risk   Falls in the past year? 0 0 0 0 1  Number falls in past yr: 0 0 0 0 1  Injury with Fall? 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 Proteinuria indicates diabetic kidney disease. Borderline blood pressure, valsartan  non-compliance noted. - Continue Jardiance  25 mg daily. - Continue Ozempic  1 mg weekly, monitor GI side effects. - Recheck blood pressure; prescribe valsartan  80 mg daily if elevated. - Encouraged weight loss for blood pressure  and cholesterol improvement.  Iron deficiency anemia due to chronic blood loss Chronic anemia, hemoglobin at 10, likely due to menorrhagia. Iron levels improved but low. - Proceed with IUD placement to manage menorrhagia and reduce blood loss.  Menorrhagia with regular cycle Heavy cycles contributing to anemia. IUD planned to manage symptoms. - Proceed with IUD placement to manage bleeding.  Morbid obesity  BMI over 50 , weight loss encouraged for health improvement. - Encouraged weight loss through diet and physical activity.  Post-surgical hypothyroidism Managed with levothyroxine . Recent medication lapse, TSH reassessment needed. - Checked TSH levels today. - Continue levothyroxine  200 mcg daily, add half pill weekly.  Obstructive sleep apnea Continue CPAP  Seasonal allergic rhinitis Managed with antihistamines. Nasal spray not used currently due to previous insurance; patient will try nasal spray as insurance allows. - Prescribed levocetirizine. - Encouraged nasal spray use as insurance allows.  General Health Maintenance Hepatitis B vaccination series ongoing, one dose remaining.        [1]  Current Outpatient Medications:    azelastine  (ASTELIN ) 0.1 % nasal spray, Place 2 sprays into both nostrils 2 (two) times daily. Use in each nostril as directed, Disp: 30 mL, Rfl: 2   Blood Glucose Monitoring Suppl DEVI, 1 each by Does not apply route in the morning, at noon, and at bedtime. May substitute to any manufacturer covered by patient's insurance., Disp: 1 each, Rfl: 0   Cholecalciferol (VITAMIN D3) 50 MCG (2000 UT) CAPS, Take 1 capsule by mouth daily at 12 noon., Disp: , Rfl:    fluticasone  (FLONASE ) 50 MCG/ACT nasal spray, Place 2 sprays into both nostrils daily., Disp: 16 g, Rfl: 0   levocetirizine (XYZAL ) 5 MG tablet, Take 1 tablet (5 mg total) by mouth every evening., Disp: 90 tablet, Rfl: 0   levothyroxine  (SYNTHROID ) 200 MCG tablet, TAKE 1 TABLET BY MOUTH DAILY  BEFORE BREAKFAST. TAKE AN EXTRA 1/2 TABLET ONCE A WEEK, Disp: 96 tablet, Rfl: 0   misoprostol  (CYTOTEC ) 200 MCG tablet, Take 3 pills by mouth the night before IUD insertion., Disp: 3 tablet, Rfl: 0   triamterene -hydrochlorothiazide  (DYAZIDE) 37.5-25 MG capsule, TAKE 1 EACH (1 CAPSULE TOTAL) BY MOUTH DAILY., Disp: 90 capsule, Rfl: 0   valACYclovir  (VALTREX ) 500 MG tablet,  TAKE 1 TABLET BY MOUTH 2 TIMES DAILY FOR OUTBREAKS AND ONCE DAILY FOR PREVENTION AS DIRECTED, Disp: 180 tablet, Rfl: 1   empagliflozin  (JARDIANCE ) 25 MG TABS tablet, Take 1 tablet (25 mg total) by mouth daily., Disp: 90 tablet, Rfl: 1   montelukast  (SINGULAIR ) 10 MG tablet, Take 1 tablet (10 mg total) by mouth at bedtime., Disp: 90 tablet, Rfl: 1   Semaglutide , 1 MG/DOSE, 4 MG/3ML SOPN, Inject 1 mg as directed once a week., Disp: 9 mL, Rfl: 0   valsartan  (DIOVAN ) 80 MG tablet, Take 1 tablet (80 mg total) by mouth daily., Disp: 90 tablet, Rfl: 0 [2]  Allergies Allergen Reactions   Hydrocodone  Itching

## 2024-02-07 ENCOUNTER — Ambulatory Visit (INDEPENDENT_AMBULATORY_CARE_PROVIDER_SITE_OTHER)

## 2024-02-07 DIAGNOSIS — Z23 Encounter for immunization: Secondary | ICD-10-CM | POA: Diagnosis not present

## 2024-02-08 ENCOUNTER — Ambulatory Visit: Admitting: Obstetrics & Gynecology

## 2024-05-01 ENCOUNTER — Ambulatory Visit: Admitting: Family Medicine

## 2024-10-13 ENCOUNTER — Encounter: Admitting: Family Medicine

## 2025-01-10 ENCOUNTER — Encounter: Admitting: Family Medicine
